# Patient Record
Sex: Male | Born: 1965 | Race: White | Hispanic: No | Smoking: Current every day smoker
Health system: Southern US, Community
[De-identification: ages and names within clinical notes are randomized; demographics above are authoritative.]

## PROBLEM LIST (undated history)

## (undated) DIAGNOSIS — J449 Chronic obstructive pulmonary disease, unspecified: Secondary | ICD-10-CM

## (undated) DIAGNOSIS — F1910 Other psychoactive substance abuse, uncomplicated: Secondary | ICD-10-CM

## (undated) DIAGNOSIS — E039 Hypothyroidism, unspecified: Secondary | ICD-10-CM

## (undated) DIAGNOSIS — C320 Malignant neoplasm of glottis: Secondary | ICD-10-CM

## (undated) DIAGNOSIS — I10 Essential (primary) hypertension: Secondary | ICD-10-CM

## (undated) DIAGNOSIS — R011 Cardiac murmur, unspecified: Secondary | ICD-10-CM

## (undated) DIAGNOSIS — R51 Headache: Secondary | ICD-10-CM

## (undated) DIAGNOSIS — R0602 Shortness of breath: Secondary | ICD-10-CM

## (undated) DIAGNOSIS — Z923 Personal history of irradiation: Secondary | ICD-10-CM

## (undated) HISTORY — DX: Malignant neoplasm of glottis: C32.0

## (undated) HISTORY — PX: TONSILLECTOMY: SUR1361

## (undated) HISTORY — PX: OTHER SURGICAL HISTORY: SHX169

## (undated) HISTORY — DX: Personal history of irradiation: Z92.3

---

## 2010-01-12 ENCOUNTER — Emergency Department (HOSPITAL_COMMUNITY): Admission: EM | Admit: 2010-01-12 | Discharge: 2010-01-12 | Payer: Self-pay | Admitting: Emergency Medicine

## 2010-01-16 ENCOUNTER — Emergency Department (HOSPITAL_COMMUNITY): Admission: EM | Admit: 2010-01-16 | Discharge: 2010-01-16 | Payer: Self-pay | Admitting: Emergency Medicine

## 2011-10-18 ENCOUNTER — Encounter (HOSPITAL_COMMUNITY): Payer: Self-pay

## 2011-10-18 ENCOUNTER — Emergency Department (HOSPITAL_COMMUNITY)
Admission: EM | Admit: 2011-10-18 | Discharge: 2011-10-18 | Disposition: A | Discharge status: Home / Self Care | Payer: Self-pay | Attending: Emergency Medicine | Admitting: Emergency Medicine

## 2011-10-18 DIAGNOSIS — F17200 Nicotine dependence, unspecified, uncomplicated: Secondary | ICD-10-CM | POA: Insufficient documentation

## 2011-10-18 DIAGNOSIS — K029 Dental caries, unspecified: Secondary | ICD-10-CM | POA: Insufficient documentation

## 2011-10-18 DIAGNOSIS — R490 Dysphonia: Secondary | ICD-10-CM | POA: Insufficient documentation

## 2011-10-18 DIAGNOSIS — R499 Unspecified voice and resonance disorder: Secondary | ICD-10-CM

## 2011-10-18 MED ORDER — AMOXICILLIN 500 MG PO CAPS: ORAL_CAPSULE | ORAL | Status: DC

## 2011-10-18 NOTE — Discharge Instructions (Signed)
Dental Caries IT IS IMPORTANT THAT YOU SEE THE EAR, NOSE AND THROAT SPECIALIST AS SOON AS POSSIBLE. PLEASE USE  AMOXICILLIN DAILY UNTIL ALL TAKEN. SEE YOUR DENTIST TO DEVELOP A PLAN CONCERNING YOUR MULTIPLE CAVITIES.   Dental caries (cavities) are areas of tooth decay. Cavities are usually caused by a combination of poor dental care; sugar; tobacco, alcohol, and drug abuse; decreased saliva production; and receding gums. If cavities are not treated by a dentist, they grow in size. This can cause toothaches, infection, and loss of the tooth. Cavities of the outer tooth enamel do not cause symptoms. Dental pain from cold drinks may be the first sign the enamel has broken down and decay has spread toward the root of the tooth. This can cause the tooth to die or become infected. If a cavity is treated before it causes toothache, the tooth can usually be saved. Cavities can be prevented by good oral hygiene. Brushing your teeth in the morning and before bed, and using dental floss once daily helps remove plaque and reduce bacteria. Candy, soft drinks, and other sources of sugar promote tooth decay by promoting the growth of bacteria in the mouth. Proper diet, fluoride, dental cleaning, and fillings are important in preventing the loss of teeth from decay. Antibiotics, root canal treatment, or dental extraction may be needed if the decay is severe. Take any pain medication or antibiotics as directed by your caregiver. It is important that you follow up with a dentist for definitive care. SEEK MEDICAL CARE IF:   You or your child has an oral temperature above 102 F (38.9 C).   There is difficulty opening the mouth.   There is difficulty swallowing or handling secretions.   There is difficulty breathing.   There is chest pain.   There are worsening or concerning symptoms.  Document Released: 08/17/2004 Document Revised: 06/29/2011 Document Reviewed: 11/02/2009 [facility] Patient Information 2012  [place], [place].

## 2011-10-18 NOTE — ED Notes (Signed)
Pt reports has sore in jaw x 6 months or a year.  Says will swell and burst in mouth.  Pt reports has been losing his voice for the past 2 weeks.  Denies any sore throat, cough or congestion.  Reports headache x 2 days.

## 2011-10-18 NOTE — ED Notes (Signed)
Pt presents with reoccurring infection in lower jaw/gum per pt. Pt also c/o headache. Denies other pain at this time.

## 2011-11-06 NOTE — ED Provider Notes (Signed)
History     CSN: [id number]  Arrival date & time 10/18/11  1449   First MD Initiated Contact with Patient 10/18/11 1514      Chief Complaint  Patient presents with  . Dental Pain  . Headache    (Consider location/radiation/quality/duration/timing/severity/associated sxs/prior treatment) Patient is a 46 y.o. male presenting with tooth pain and headaches. The history is provided by the patient and a friend.  Dental PainThe primary symptoms include mouth pain and headaches. Primary symptoms do not include shortness of breath, sore throat or cough. Primary symptoms comment: lose of voice The symptoms began more than 1 week ago. The symptoms are worsening. The symptoms occur frequently.  The headache is not associated with photophobia.  Additional symptoms include: dental sensitivity to temperature, gum swelling, gum tenderness, jaw pain and facial swelling. Additional symptoms do not include: trouble swallowing, pain with swallowing, excessive salivation, smell disturbance, ear pain and nosebleeds. Associated symptoms comments: hoarseness. Medical issues include: smoking.   Headache  Pertinent negatives include no palpitations and no shortness of breath.    History reviewed. No pertinent past medical history.  History reviewed. No pertinent past surgical history.  No family history on file.  History  Substance Use Topics  . Smoking status: Current Everyday Smoker  . Smokeless tobacco: Not on file  . Alcohol Use: No      Review of Systems  Constitutional: Negative for activity change.       All ROS Neg except as noted in HPI  HENT: Positive for congestion, facial swelling, dental problem and voice change. Negative for ear pain, nosebleeds, sore throat, trouble swallowing and neck pain.   Eyes: Negative for photophobia and discharge.  Respiratory: Negative for cough, shortness of breath and wheezing.   Cardiovascular: Negative for chest pain and palpitations.    Gastrointestinal: Negative for abdominal pain and blood in stool.  Genitourinary: Negative for dysuria, frequency and hematuria.  Musculoskeletal: Negative for back pain and arthralgias.  Skin: Negative.   Neurological: Positive for headaches. Negative for dizziness, seizures and speech difficulty.  Psychiatric/Behavioral: Negative for hallucinations and confusion.    Allergies  Review of patient's allergies indicates no known allergies.  Home Medications   Current Outpatient Rx  Name Route Sig Dispense Refill  . AMOXICILLIN 500 MG PO CAPS  2 po bid with food 28 capsule 0    BP 116/73  Pulse 80  Temp(Src) 97.4 F (36.3 C) (Oral)  Resp 20  Ht 5\' 11"  (1.803 m)  Wt 153 lb (69.4 kg)  BMI 21.34 kg/m2  SpO2 100%  Physical Exam  Nursing note and vitals reviewed. Constitutional: He is oriented to person, place, and time. He appears well-developed and well-nourished.  Non-toxic appearance.  HENT:  Head: Normocephalic.  Right Ear: Tympanic membrane and external ear normal.  Left Ear: Tympanic membrane and external ear normal.       Mulltiple dental caries. Mild swelling of right lower gums. Airway patent. Uvula mid line. No mass present  Eyes: EOM and lids are normal. Pupils are equal, round, and reactive to light.  Neck: Normal range of motion. Neck supple. Carotid bruit is not present.  Cardiovascular: Normal rate, regular rhythm, normal heart sounds, intact distal pulses and normal pulses.   Pulmonary/Chest: Breath sounds normal. No respiratory distress.  Abdominal: Soft. Bowel sounds are normal. There is no tenderness. There is no guarding.  Musculoskeletal: Normal range of motion.  Lymphadenopathy:       Head (right side): No submandibular adenopathy present.  Head (left side): No submandibular adenopathy present.    He has no cervical adenopathy.  Neurological: He is alert and oriented to person, place, and time. He has normal strength. No cranial nerve deficit or  sensory deficit.  Skin: Skin is warm and dry.  Psychiatric: He has a normal mood and affect. His speech is normal.    ED Course  Procedures (including critical care time)  Labs Reviewed - No data to display No results found.   1. Hoarseness or changing voice   2. Dental caries       MDM  *I have reviewed nursing notes, vital signs, and all appropriate lab and imaging results for this patient.** Pt strongly advised to see Dr Suszanne Conners (ENT) for evaluation. Rx amoxicillin given for assistance with dental infections. Pt to see a dentist as soon as possible.       Kathie Dike, [place] 11/06/11 2209

## 2011-11-08 NOTE — ED Provider Notes (Signed)
Medical screening examination/treatment/procedure(s) were performed by non-physician practitioner and as supervising physician I was immediately available for consultation/collaboration. Devoria Albe, MD, Armando Gang   Ward Givens, MD 11/08/11 [phone]

## 2012-02-22 NOTE — ED Notes (Signed)
Pt called to get referral information from prior discharge.

## 2012-03-21 ENCOUNTER — Ambulatory Visit (INDEPENDENT_AMBULATORY_CARE_PROVIDER_SITE_OTHER): Payer: Self-pay | Admitting: Otolaryngology

## 2013-03-17 ENCOUNTER — Ambulatory Visit: Payer: Self-pay | Attending: Internal Medicine | Admitting: Internal Medicine

## 2013-03-17 VITALS — BP 120/70 | HR 65 | Temp 98.0°F | Resp 20 | Ht 71.06 in | Wt 170.2 lb

## 2013-03-17 DIAGNOSIS — R519 Headache, unspecified: Secondary | ICD-10-CM | POA: Insufficient documentation

## 2013-03-17 DIAGNOSIS — R51 Headache: Secondary | ICD-10-CM | POA: Insufficient documentation

## 2013-03-17 DIAGNOSIS — R490 Dysphonia: Secondary | ICD-10-CM

## 2013-03-17 MED ORDER — TRAMADOL HCL 50 MG PO TABS: 50.0000 mg | ORAL_TABLET | Freq: Three times a day (TID) | ORAL | Status: DC | PRN

## 2013-03-17 NOTE — Progress Notes (Signed)
Pt is here to est care C/o voice hoarseness x1 year... Last week gradually getting worse accompanied w/HA Seen at a hosp ER... States they did not dx him w/anything and was to f/u w/a specialist that he never did Also reports he used to take thyroid meds about 8 yrs ago; only took it for a month and stopped Smokes 1 PPD Alert w/no signs of acute distress.

## 2013-03-17 NOTE — Progress Notes (Signed)
Patient ID: [name], male   DOB: 1966/05/31, 47 y.o.   MRN: [id number]  CC: To establish care  HPI: Patient is a 47 years old man who came to our clinic today to establish medical care. He has no significant past medical history, for the past 1 year patient has noticed increasing hoarseness and change in his voice. And recently he developed a nagging headache that comes and goes, today he has no headache, he describes the headache as global, sometimes with blurry vision, comes flashing and a releave without medication. He smokes cigarettes about a pack a day for the past 32 years. Denies chest pain, no difficulty swallowing, no shortness of breath. No leg swelling.  No Known Allergies History reviewed. No pertinent past medical history. Current Outpatient Prescriptions on File Prior to Visit  Medication Sig Dispense Refill  . amoxicillin (AMOXIL) 500 MG capsule 2 po bid with food  28 capsule  0   No current facility-administered medications on file prior to visit.   No family history on file. History   Social History  . Marital Status: Married    Spouse Name: N/A    Number of Children: N/A  . Years of Education: N/A   Occupational History  . Not on file.   Social History Main Topics  . Smoking status: Current Every Day Smoker -- 1.00 packs/day    Types: Cigarettes  . Smokeless tobacco: Not on file  . Alcohol Use: No  . Drug Use: No  . Sexual Activity: Not on file   Other Topics Concern  . Not on file   Social History Narrative  . No narrative on file    Review of Systems: Constitutional: Negative for fever, chills, diaphoresis, activity change, appetite change and fatigue. HENT: Negative for ear pain, nosebleeds, congestion, facial swelling, rhinorrhea, neck pain, neck stiffness and ear discharge.  Eyes: Negative for pain, discharge, redness, itching and visual disturbance. Respiratory: Negative for cough, choking, chest tightness, shortness of breath, wheezing and  stridor.  Cardiovascular: Negative for chest pain, palpitations and leg swelling. Gastrointestinal: Negative for abdominal distention. Genitourinary: Negative for dysuria, urgency, frequency, hematuria, flank pain, decreased urine volume, difficulty urinating and dyspareunia.  Musculoskeletal: Negative for back pain, joint swelling, arthralgias and gait problem. Neurological: Negative for dizziness, tremors, seizures, syncope, facial asymmetry, speech difficulty, weakness, light-headedness, numbness and headaches.  Hematological: Negative for adenopathy. Does not bruise/bleed easily. Psychiatric/Behavioral: Negative for hallucinations, behavioral problems, confusion, dysphoric mood, decreased concentration and agitation.    Objective:   Filed Vitals:   03/17/13 1240  BP: 120/70  Pulse: 65  Temp: 98 F (36.7 C)  Resp: 20    Physical Exam: Constitutional: Patient appears well-developed and well-nourished. No distress. HENT: Normocephalic, atraumatic, External right and left ear normal. Oropharynx is clear and moist.  Eyes: Conjunctivae and EOM are normal. PERRLA, no scleral icterus. Neck: Normal ROM. Neck supple. No JVD. No tracheal deviation. No thyromegaly. CVS: RRR, S1/S2 +, no murmurs, no gallops, no carotid bruit.  Pulmonary: Effort and breath sounds normal, no stridor, rhonchi, wheezes, rales.  Abdominal: Soft. BS +,  no distension, tenderness, rebound or guarding.  Musculoskeletal: Normal range of motion. No edema and no tenderness.  Lymphadenopathy: No lymphadenopathy noted, cervical, inguinal or axillary Neuro: Alert. Normal reflexes, muscle tone coordination. No cranial nerve deficit. Skin: Skin is warm and dry. No rash noted. Not diaphoretic. No erythema. No pallor. Psychiatric: Normal mood and affect. Behavior, judgment, thought content normal.  No results found for this  basename: WBC, HGB, HCT, MCV, PLT   No results found for this basename: CREATININE, BUN, NA, K, CL,  CO2    No results found for this basename: HGBA1C   Lipid Panel  No results found for this basename: chol, trig, hdl, cholhdl, vldl, ldlcalc       Assessment and plan:   Patient Active Problem List   Diagnosis Date Noted  . Hoarseness of voice 03/17/2013  . Headache(784.0) 03/17/2013   Patient's hoarse voice and recent development of headache is concerning for a major pathology in the head and neck region most importantly we need to rule out malignancy. CT scan head with contrast CT scan neck and soft tissue  Tramadol for headache  Labs: TSH and free T4 Comprehensive metabolic panel CBCD Urinalysis  Subsequent management with depend on findings from above investigations Ambulatory referral to ENT surgeon today for further evaluation  [name] was given clear instructions to go to ER or return to the clinic if symptoms don't improve, worsen or new problems develop.  [name] verbalized understanding.  [name] was told to call to get lab results if hasn't heard anything in the next week.        Jeanann Lewandowsky, MD [facility] [place], [place] [phone]   03/17/2013, 1:12 PM

## 2013-03-18 LAB — CBC WITH DIFFERENTIAL/PLATELET
Basophils Relative: 0 % (ref 0–1)
Hemoglobin: 14 g/dL (ref 13.0–17.0)
Lymphs Abs: 1.7 10*3/uL (ref 0.7–4.0)
Monocytes Relative: 6 % (ref 3–12)
Neutro Abs: 4.1 10*3/uL (ref 1.7–7.7)
Neutrophils Relative %: 63 % (ref 43–77)
RBC: 4.39 MIL/uL (ref 4.22–5.81)
WBC: 6.5 10*3/uL (ref 4.0–10.5)

## 2013-03-18 LAB — COMPLETE METABOLIC PANEL WITH GFR
AST: 18 U/L (ref 0–37)
Alkaline Phosphatase: 45 U/L (ref 39–117)
BUN: 16 mg/dL (ref 6–23)
Creat: 1.27 mg/dL (ref 0.50–1.35)
Potassium: 4 mEq/L (ref 3.5–5.3)

## 2013-03-19 LAB — TSH+FREE T4
Free T4: 0.24 ng/dL — ABNORMAL LOW (ref 0.82–1.77)
TSH: 114.2 u[IU]/mL — ABNORMAL HIGH (ref 0.450–4.500)

## 2013-03-20 ENCOUNTER — Ambulatory Visit (HOSPITAL_COMMUNITY): Payer: Self-pay

## 2013-03-20 ENCOUNTER — Ambulatory Visit (HOSPITAL_COMMUNITY)
Admission: RE | Admit: 2013-03-20 | Discharge: 2013-03-20 | Disposition: A | Discharge status: Home / Self Care | Payer: Self-pay | Source: Ambulatory Visit | Attending: Internal Medicine | Admitting: Internal Medicine

## 2013-03-20 ENCOUNTER — Other Ambulatory Visit: Payer: Self-pay | Admitting: Internal Medicine

## 2013-03-20 DIAGNOSIS — R490 Dysphonia: Secondary | ICD-10-CM

## 2013-03-20 DIAGNOSIS — J438 Other emphysema: Secondary | ICD-10-CM | POA: Insufficient documentation

## 2013-03-20 DIAGNOSIS — J387 Other diseases of larynx: Secondary | ICD-10-CM | POA: Insufficient documentation

## 2013-03-20 DIAGNOSIS — R51 Headache: Secondary | ICD-10-CM

## 2013-03-20 DIAGNOSIS — H538 Other visual disturbances: Secondary | ICD-10-CM | POA: Insufficient documentation

## 2013-03-20 MED ORDER — IOHEXOL 300 MG/ML  SOLN: 80.0000 mL | Freq: Once | INTRAMUSCULAR | Status: AC | PRN

## 2013-03-21 ENCOUNTER — Telehealth: Payer: Self-pay | Admitting: Emergency Medicine

## 2013-03-21 NOTE — Telephone Encounter (Signed)
Spoke with pt regarding CT results head/neck. Pt in process of getting orange card. Has referral in place for ENT. Will speak with Len Blalock RN

## 2013-03-21 NOTE — Telephone Encounter (Signed)
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 He needs to see an ENT surgeon immediately as scheduled ------

## 2013-03-27 ENCOUNTER — Ambulatory Visit: Payer: No Typology Code available for payment source | Attending: Family Medicine

## 2013-03-31 ENCOUNTER — Telehealth: Payer: Self-pay | Admitting: Emergency Medicine

## 2013-03-31 ENCOUNTER — Other Ambulatory Visit: Payer: Self-pay | Admitting: Internal Medicine

## 2013-03-31 DIAGNOSIS — E039 Hypothyroidism, unspecified: Secondary | ICD-10-CM

## 2013-03-31 MED ORDER — LEVOTHYROXINE SODIUM 50 MCG PO TABS: 50.0000 ug | ORAL_TABLET | Freq: Every day | ORAL | Status: DC

## 2013-03-31 NOTE — Telephone Encounter (Signed)
Script for Synthroid faxed in [facility]

## 2013-03-31 NOTE — Telephone Encounter (Signed)
PT REQUESTING LAB RESULTS

## 2013-03-31 NOTE — Telephone Encounter (Signed)
SPOKE WITH WIFE CONCERNING THYROID LEVEL RESULTS. SCRIPT CALLED IN SYNTHROID DAILY TO [facility]

## 2013-03-31 NOTE — Telephone Encounter (Signed)
Levothyroxine 50 mcg tab po daily, disp 30 tabs with 3 refills Patient needs ENT evaluation and treatment. Please refer and schedule appointment immediately. Thank you.

## 2013-03-31 NOTE — Telephone Encounter (Signed)
SPOKE WITH PT WIFE. APPROVED FOR [facility] DIRECTED WIFE TO Arna Medici FOR ENT SURGEON REFERRAL REQUESTING BLOOD WORK RESULTS

## 2013-04-17 ENCOUNTER — Ambulatory Visit: Payer: Self-pay | Admitting: Family Medicine

## 2013-04-18 ENCOUNTER — Encounter (HOSPITAL_COMMUNITY): Payer: Self-pay | Admitting: Pharmacy Technician

## 2013-04-21 ENCOUNTER — Other Ambulatory Visit: Payer: Self-pay | Admitting: Otolaryngology

## 2013-04-21 NOTE — Addendum Note (Signed)
Addended by: Annalee Genta, Deundra Furber on: 04/21/2013 12:00 PM   Modules accepted: Orders

## 2013-04-22 ENCOUNTER — Encounter (HOSPITAL_COMMUNITY)
Admission: RE | Admit: 2013-04-22 | Discharge: 2013-04-22 | Disposition: A | Discharge status: Home / Self Care | Payer: No Typology Code available for payment source | Source: Ambulatory Visit | Attending: Otolaryngology | Admitting: Otolaryngology

## 2013-04-22 ENCOUNTER — Encounter (HOSPITAL_COMMUNITY): Payer: Self-pay

## 2013-04-22 ENCOUNTER — Encounter (HOSPITAL_COMMUNITY)
Admission: RE | Admit: 2013-04-22 | Discharge: 2013-04-22 | Disposition: A | Discharge status: Home / Self Care | Payer: No Typology Code available for payment source | Source: Ambulatory Visit | Attending: Anesthesiology | Admitting: Anesthesiology

## 2013-04-22 DIAGNOSIS — Z01812 Encounter for preprocedural laboratory examination: Secondary | ICD-10-CM | POA: Insufficient documentation

## 2013-04-22 DIAGNOSIS — Z01818 Encounter for other preprocedural examination: Secondary | ICD-10-CM | POA: Insufficient documentation

## 2013-04-22 HISTORY — DX: Cardiac murmur, unspecified: R01.1

## 2013-04-22 HISTORY — DX: Hypothyroidism, unspecified: E03.9

## 2013-04-22 HISTORY — DX: Headache: R51

## 2013-04-22 LAB — CBC
HCT: 39.5 % (ref 39.0–52.0)
MCV: 95 fL (ref 78.0–100.0)
Platelets: 195 10*3/uL (ref 150–400)
RBC: 4.16 MIL/uL — ABNORMAL LOW (ref 4.22–5.81)
WBC: 6.3 10*3/uL (ref 4.0–10.5)

## 2013-04-22 NOTE — Pre-Procedure Instructions (Addendum)
[name]  04/22/2013   Your procedure is scheduled on: 04/24/13  [facility] at 810 AM.  Call this number if you have problems the morning of surgery: [phone]   Remember:   Do not eat food or drink liquids after midnight.   Take these medicines the morning of surgery with A SIP OF WATER: levothyroxine   Do not wear jewelry, make-up or nail polish.  Do not wear lotions, powders, or perfumes. You may wear deodorant.  Do not shave 48 hours prior to surgery. Men may shave face and neck.  Do not bring valuables to the hospital.  [facility] is not responsible                  for any belongings or valuables.               Contacts, dentures or bridgework may not be worn into surgery.  Leave suitcase in the car. After surgery it may be brought to your room.  For patients admitted to the hospital, discharge time is determined by your                treatment team.               Patients discharged the day of surgery will not be allowed to drive  home.  Name and phone number of your driver:   Special Instructions: Shower using CHG 2 nights before surgery and the night before surgery.  If you shower the day of surgery use CHG.  Use special wash - you have one bottle of CHG for all showers.  You should use approximately 1/3 of the bottle for each shower.   Please read over the following fact sheets that you were given: Pain Booklet, Coughing and Deep Breathing and Surgical Site Infection Prevention

## 2013-04-23 ENCOUNTER — Other Ambulatory Visit (HOSPITAL_COMMUNITY): Payer: No Typology Code available for payment source

## 2013-04-23 ENCOUNTER — Ambulatory Visit: Payer: Self-pay

## 2013-04-23 MED ORDER — CEFAZOLIN SODIUM-DEXTROSE 2-3 GM-% IV SOLR
2.0000 g | INTRAVENOUS | Status: AC
  Administered 2013-04-24: 2 g via INTRAVENOUS
  Filled 2013-04-23: qty 50

## 2013-04-24 ENCOUNTER — Encounter (HOSPITAL_COMMUNITY)
Admission: RE | Disposition: A | Discharge status: Home / Self Care | Payer: Self-pay | Source: Ambulatory Visit | Attending: Otolaryngology

## 2013-04-24 ENCOUNTER — Ambulatory Visit (HOSPITAL_COMMUNITY): Payer: No Typology Code available for payment source | Admitting: Anesthesiology

## 2013-04-24 ENCOUNTER — Encounter (HOSPITAL_COMMUNITY): Payer: Self-pay | Admitting: Otolaryngology

## 2013-04-24 ENCOUNTER — Ambulatory Visit (HOSPITAL_COMMUNITY)
Admission: RE | Admit: 2013-04-24 | Discharge: 2013-04-24 | Disposition: A | Discharge status: Home / Self Care | Payer: No Typology Code available for payment source | Source: Ambulatory Visit | Attending: Otolaryngology | Admitting: Otolaryngology

## 2013-04-24 ENCOUNTER — Encounter (HOSPITAL_COMMUNITY): Payer: Self-pay | Admitting: Vascular Surgery

## 2013-04-24 DIAGNOSIS — R220 Localized swelling, mass and lump, head: Secondary | ICD-10-CM | POA: Insufficient documentation

## 2013-04-24 DIAGNOSIS — J351 Hypertrophy of tonsils: Secondary | ICD-10-CM | POA: Insufficient documentation

## 2013-04-24 DIAGNOSIS — R490 Dysphonia: Secondary | ICD-10-CM | POA: Insufficient documentation

## 2013-04-24 DIAGNOSIS — C320 Malignant neoplasm of glottis: Secondary | ICD-10-CM | POA: Insufficient documentation

## 2013-04-24 DIAGNOSIS — J383 Other diseases of vocal cords: Secondary | ICD-10-CM

## 2013-04-24 HISTORY — PX: MICROLARYNGOSCOPY: SHX5208

## 2013-04-24 HISTORY — PX: DIRECT LARYNGOSCOPY: SHX5326

## 2013-04-24 SURGERY — LARYNGOSCOPY, DIRECT
Anesthesia: General | Site: Mouth | Laterality: Right | Wound class: Clean Contaminated

## 2013-04-24 MED ORDER — OXYCODONE HCL 5 MG/5ML PO SOLN: 5.0000 mg | Freq: Once | ORAL | Status: DC | PRN

## 2013-04-24 MED ORDER — ROCURONIUM BROMIDE 100 MG/10ML IV SOLN
INTRAVENOUS | Status: DC | PRN
  Administered 2013-04-24: 25 mg via INTRAVENOUS

## 2013-04-24 MED ORDER — EPINEPHRINE HCL (NASAL) 0.1 % NA SOLN
NASAL | Status: DC | PRN
  Administered 2013-04-24: 30 mL via TOPICAL

## 2013-04-24 MED ORDER — OXYCODONE HCL 5 MG PO TABS: 5.0000 mg | ORAL_TABLET | Freq: Once | ORAL | Status: DC | PRN

## 2013-04-24 MED ORDER — GLYCOPYRROLATE 0.2 MG/ML IJ SOLN
INTRAMUSCULAR | Status: DC | PRN
  Administered 2013-04-24: 0.6 mg via INTRAVENOUS

## 2013-04-24 MED ORDER — FENTANYL CITRATE 0.05 MG/ML IJ SOLN
INTRAMUSCULAR | Status: DC | PRN
  Administered 2013-04-24: 100 ug via INTRAVENOUS
  Administered 2013-04-24: 50 ug via INTRAVENOUS

## 2013-04-24 MED ORDER — SUCCINYLCHOLINE CHLORIDE 20 MG/ML IJ SOLN
INTRAMUSCULAR | Status: DC | PRN
  Administered 2013-04-24: 60 mg via INTRAVENOUS

## 2013-04-24 MED ORDER — PROPOFOL 10 MG/ML IV BOLUS
INTRAVENOUS | Status: DC | PRN
  Administered 2013-04-24: 180 mg via INTRAVENOUS

## 2013-04-24 MED ORDER — PROMETHAZINE HCL 25 MG/ML IJ SOLN: 6.2500 mg | INTRAMUSCULAR | Status: DC | PRN

## 2013-04-24 MED ORDER — LACTATED RINGERS IV SOLN
INTRAVENOUS | Status: DC
  Administered 2013-04-24: 09:00:00 via INTRAVENOUS

## 2013-04-24 MED ORDER — MIDAZOLAM HCL 5 MG/5ML IJ SOLN
INTRAMUSCULAR | Status: DC | PRN
  Administered 2013-04-24: 2 mg via INTRAVENOUS

## 2013-04-24 MED ORDER — FENTANYL CITRATE 0.05 MG/ML IJ SOLN: 50.0000 ug | Freq: Once | INTRAMUSCULAR | Status: DC

## 2013-04-24 MED ORDER — ONDANSETRON HCL 4 MG/2ML IJ SOLN
INTRAMUSCULAR | Status: DC | PRN
  Administered 2013-04-24: 4 mg via INTRAVENOUS

## 2013-04-24 MED ORDER — ALBUTEROL SULFATE HFA 108 (90 BASE) MCG/ACT IN AERS
INHALATION_SPRAY | RESPIRATORY_TRACT | Status: DC | PRN
  Administered 2013-04-24: 4 via RESPIRATORY_TRACT

## 2013-04-24 MED ORDER — EPINEPHRINE HCL (NASAL) 0.1 % NA SOLN
NASAL | Status: AC
  Filled 2013-04-24: qty 30

## 2013-04-24 MED ORDER — NEOSTIGMINE METHYLSULFATE 1 MG/ML IJ SOLN
INTRAMUSCULAR | Status: DC | PRN
  Administered 2013-04-24: 3 mg via INTRAVENOUS

## 2013-04-24 MED ORDER — HYDROMORPHONE HCL PF 1 MG/ML IJ SOLN: 0.2500 mg | INTRAMUSCULAR | Status: DC | PRN

## 2013-04-24 MED ORDER — DEXAMETHASONE SODIUM PHOSPHATE 10 MG/ML IJ SOLN
INTRAMUSCULAR | Status: AC
  Filled 2013-04-24: qty 1

## 2013-04-24 MED ORDER — MIDAZOLAM HCL 2 MG/2ML IJ SOLN: 1.0000 mg | INTRAMUSCULAR | Status: DC | PRN

## 2013-04-24 MED ORDER — 0.9 % SODIUM CHLORIDE (POUR BTL) OPTIME
TOPICAL | Status: DC | PRN
  Administered 2013-04-24: 1000 mL

## 2013-04-24 MED ORDER — DEXAMETHASONE SODIUM PHOSPHATE 10 MG/ML IJ SOLN
10.0000 mg | Freq: Once | INTRAMUSCULAR | Status: AC
  Administered 2013-04-24: 10 mg via INTRAVENOUS

## 2013-04-24 MED ORDER — LIDOCAINE HCL (CARDIAC) 20 MG/ML IV SOLN
INTRAVENOUS | Status: DC | PRN
  Administered 2013-04-24: 80 mg via INTRAVENOUS

## 2013-04-24 MED ORDER — HYDROCODONE-ACETAMINOPHEN 10-300 MG/15ML PO SOLN: 5.0000 mL | ORAL | Status: DC | PRN

## 2013-04-24 SURGICAL SUPPLY — 27 items
BALLN PULM 15 16.5 18X75 (BALLOONS) ×2
BALLOON PULM 15 16.5 18X75 (BALLOONS) ×1 IMPLANT
BLADE SURG 15 STRL LF DISP TIS (BLADE) ×1 IMPLANT
BLADE SURG 15 STRL SS (BLADE) ×1
CANISTER SUCTION 2500CC (MISCELLANEOUS) ×2 IMPLANT
CLOTH BEACON ORANGE TIMEOUT ST (SAFETY) ×2 IMPLANT
CONT SPEC 4OZ CLIKSEAL STRL BL (MISCELLANEOUS) ×2 IMPLANT
COVER TABLE BACK 60X90 (DRAPES) ×2 IMPLANT
DRAPE PROXIMA HALF (DRAPES) ×2 IMPLANT
DRESSING TELFA 8X3 (GAUZE/BANDAGES/DRESSINGS) ×2 IMPLANT
GAUZE SPONGE 4X4 16PLY XRAY LF (GAUZE/BANDAGES/DRESSINGS) ×2 IMPLANT
GLOVE BIOGEL M 7.0 STRL (GLOVE) ×2 IMPLANT
GLOVE BIOGEL PI IND STRL 6.5 (GLOVE) ×1 IMPLANT
GLOVE BIOGEL PI INDICATOR 6.5 (GLOVE) ×1
GUARD TEETH (MISCELLANEOUS) ×2 IMPLANT
KIT ROOM TURNOVER OR (KITS) ×2 IMPLANT
NEEDLE 18GX1X1/2 (RX/OR ONLY) (NEEDLE) ×2 IMPLANT
NEEDLE 22X1 1/2 (OR ONLY) (NEEDLE) ×2 IMPLANT
NS IRRIG 1000ML POUR BTL (IV SOLUTION) ×2 IMPLANT
PAD ARMBOARD 7.5X6 YLW CONV (MISCELLANEOUS) ×4 IMPLANT
PATTIES SURGICAL .5 X3 (DISPOSABLE) ×2 IMPLANT
SOLUTION ANTI FOG 6CC (MISCELLANEOUS) ×2 IMPLANT
SPONGE GAUZE 4X4 12PLY (GAUZE/BANDAGES/DRESSINGS) ×2 IMPLANT
SURGILUBE 2OZ TUBE FLIPTOP (MISCELLANEOUS) ×2 IMPLANT
SYR INFLATE BILIARY GAUGE (MISCELLANEOUS) ×2 IMPLANT
TOWEL OR 17X24 6PK STRL BLUE (TOWEL DISPOSABLE) ×4 IMPLANT
TUBE CONNECTING 12X1/4 (SUCTIONS) ×2 IMPLANT

## 2013-04-24 NOTE — Anesthesia Postprocedure Evaluation (Signed)
  Anesthesia Post-op Note  Patient: [name]  Procedure(s) Performed: Procedure(s): DIRECT LARYNGOSCOPY and BIOPSY OF TONGUE (Right) MICROLARYNGOSCOPY and RIGHT VOCAL CORD BIOPSY (Right)  Patient Location: PACU  Anesthesia Type:General  Level of Consciousness: awake and alert   Airway and Oxygen Therapy: Patient Spontanous Breathing  Post-op Pain: mild  Post-op Assessment: Post-op Vital signs reviewed, Patient's Cardiovascular Status Stable, Respiratory Function Stable, Patent Airway, No signs of Nausea or vomiting and Pain level controlled  Post-op Vital Signs: Reviewed and stable  Complications: No apparent anesthesia complications

## 2013-04-24 NOTE — H&P (Signed)
[name] is an 47 y.o. male.   Chief Complaint: Chronic hoarseness HPI: Long term smoker, 2 yr hx of prog hoarseness. 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  Past Medical History  Diagnosis Date  . Hypothyroidism   . Heart murmur     child  . ZOXWRUEA(540.9)     Past Surgical History  Procedure Laterality Date  . Tonsillectomy    . Wisdon teeth      No family history on file. Social History:  reports that he has been smoking Cigarettes.  He has a 68 pack-year smoking history. He does not have any smokeless tobacco history on file. He reports that he does not drink alcohol or use illicit drugs.  Allergies: No Known Allergies  No prescriptions prior to admission    Results for orders placed during the hospital encounter of 04/22/13 (from the past 48 hour(s))  CBC     Status: Abnormal   Collection Time    04/22/13  3:45 PM      Result Value Range   WBC 6.3  4.0 - 10.5 K/uL   RBC 4.16 (*) 4.22 - 5.81 MIL/uL   Hemoglobin 13.9  13.0 - 17.0 g/dL   HCT 81.1  91.4 - 78.2 %   MCV 95.0  78.0 - 100.0 fL   MCH 33.4  26.0 - 34.0 pg   MCHC 35.2  30.0 - 36.0 g/dL   RDW 95.6  21.3 - 08.6 %   Platelets 195  150 - 400 K/uL   Dg Chest 2 View  04/22/2013   CLINICAL DATA:  Pre admit for vocal cord surgery. Smoker. Hoarseness.  EXAM: CHEST  2 VIEW  COMPARISON:  None.  FINDINGS: Mild hyperinflation. Midline trachea. Normal heart size and mediastinal contours. No pleural effusion or pneumothorax. Mild biapical pleural parenchymal scarring. Clear lungs.  IMPRESSION: Mild hyperinflation and pleural-parenchymal scarring at the apices. No acute findings.   Electronically Signed   By: Jeronimo Greaves   On: 04/22/2013 16:05    Review of Systems  Constitutional: Negative.   Respiratory: Negative.   Cardiovascular: Negative.   Gastrointestinal: Negative.   Genitourinary: Negative.     There were no vitals taken for this visit. Physical Exam  Constitutional: He is oriented to  person, place, and time. He appears well-developed and well-nourished.  HENT:  Lingual tonsil hypertrophy Rt VC mass  Neck: Normal range of motion. Neck supple.  Cardiovascular: Normal rate.   Respiratory: Effort normal.  GI: Soft. Bowel sounds are normal.  Musculoskeletal: Normal range of motion.  Neurological: He is alert and oriented to person, place, and time.     Assessment/Plan Adm for OP DL and Bx.  Audel Coakley 04/24/2013, 7:42 AM

## 2013-04-24 NOTE — Anesthesia Preprocedure Evaluation (Addendum)
Anesthesia Evaluation  Patient identified by MRN, date of birth, ID band Patient awake    Reviewed: Allergy & Precautions, H&P , NPO status , Patient's Chart, lab work & pertinent test results  Airway Mallampati: I TM Distance: >3 FB Neck ROM: Full    Dental  (+) Teeth Intact   Pulmonary COPDCurrent Smoker,  hoarse + rhonchi         Cardiovascular Rhythm:Regular Rate:Normal     Neuro/Psych  Headaches,    GI/Hepatic   Endo/Other  Hypothyroidism   Renal/GU      Musculoskeletal   Abdominal   Peds  Hematology   Anesthesia Other Findings   Reproductive/Obstetrics                         Anesthesia Physical Anesthesia Plan  ASA: II  Anesthesia Plan: General   Post-op Pain Management:    Induction: Intravenous  Airway Management Planned: Oral ETT  Additional Equipment:   Intra-op Plan:   Post-operative Plan: Extubation in OR  Informed Consent: I have reviewed the patients History and Physical, chart, labs and discussed the procedure including the risks, benefits and alternatives for the proposed anesthesia with the patient or authorized representative who has indicated his/her understanding and acceptance.     Plan Discussed with: CRNA and Surgeon  Anesthesia Plan Comments:         Anesthesia Quick Evaluation

## 2013-04-24 NOTE — Preoperative (Signed)
Beta Blockers   Reason not to administer Beta Blockers:Not Applicable 

## 2013-04-24 NOTE — Anesthesia Procedure Notes (Addendum)
Procedure Name: Intubation Date/Time: 04/24/2013 10:17 AM Performed by: Quentin Ore Pre-anesthesia Checklist: Patient identified, Emergency Drugs available, Suction available, Patient being monitored and Timeout performed Patient Re-evaluated:Patient Re-evaluated prior to inductionOxygen Delivery Method: Circle system utilized Preoxygenation: Pre-oxygenation with 100% oxygen Intubation Type: IV induction Ventilation: Mask ventilation without difficulty Laryngoscope Size: Mac and 4 Grade View: Grade II Tube type: Oral Tube size: 6.5 mm Number of attempts: 1 Airway Equipment and Method: Stylet Placement Confirmation: ETT inserted through vocal cords under direct vision,  positive ETCO2 and breath sounds checked- equal and bilateral Secured at: 22 cm Tube secured with: Tape Dental Injury: Teeth and Oropharynx as per pre-operative assessment  Comments: Bottom lip injury with DL per surgeon.

## 2013-04-24 NOTE — Brief Op Note (Signed)
04/24/2013  11:13 AM  PATIENT:  [name]  47 y.o. male  PRE-OPERATIVE DIAGNOSIS:  Swelling, Mass, or Lump in Head and Neck/Congenital Anomaly of Larynx, Trachea, and Bronchus/Dysphonia   POST-OPERATIVE DIAGNOSIS:  Swelling, Mass, or Lump in Head and Neck/Congenital Anomaly of Larynx, Trachea, and Bronchus/Dysphonia   PROCEDURE:  Procedure(s): DIRECT LARYNGOSCOPY and BIOPSY OF TONGUE (Right) MICROLARYNGOSCOPY and RIGHT VOCAL CORD BIOPSY (Right)  SURGEON:  Surgeon(s) and Role:    * Osborn Coho, MD - Primary  PHYSICIAN ASSISTANT:   ASSISTANTS: none   ANESTHESIA:   general  EBL:   Min  BLOOD ADMINISTERED:none  DRAINS: none   LOCAL MEDICATIONS USED:  NONE  SPECIMEN:  Source of Specimen:  1. Rt vocal mass  2. tongue base   DISPOSITION OF SPECIMEN:  PATHOLOGY  COUNTS:  YES  TOURNIQUET:  * No tourniquets in log *  DICTATION: .Other Dictation: Dictation Number [id number]  PLAN OF CARE: Discharge to home after PACU  PATIENT DISPOSITION:  PACU - hemodynamically stable.   Delay start of Pharmacological VTE agent (>24hrs) due to surgical blood loss or risk of bleeding: not applicable

## 2013-04-24 NOTE — Transfer of Care (Signed)
Immediate Anesthesia Transfer of Care Note  Patient: [name]  Procedure(s) Performed: Procedure(s): DIRECT LARYNGOSCOPY and BIOPSY OF TONGUE (Right) MICROLARYNGOSCOPY and RIGHT VOCAL CORD BIOPSY (Right)  Patient Location: PACU  Anesthesia Type:General  Level of Consciousness: awake, alert  and oriented  Airway & Oxygen Therapy: Patient Spontanous Breathing and Patient connected to face mask oxygen  Post-op Assessment: Report given to PACU RN, Post -op Vital signs reviewed and stable and Patient moving all extremities X 4  Post vital signs: Reviewed and stable  Complications: No apparent anesthesia complications

## 2013-04-25 ENCOUNTER — Encounter (HOSPITAL_COMMUNITY): Payer: Self-pay | Admitting: Otolaryngology

## 2013-04-25 NOTE — Op Note (Signed)
[name], [name] NO.:  [id number]  MEDICAL RECORD NO.:  [id number]  LOCATION:  [facility]                         FACILITY:  [facility]  PHYSICIAN:  Kinnie Scales. Annalee Genta, M.D.DATE OF BIRTH:  Dec 20, 1965  DATE OF PROCEDURE:  04/24/2013 DATE OF DISCHARGE:  04/24/2013                              OPERATIVE REPORT   PREOPERATIVE DIAGNOSES: 1. Right vocal cord mass. 2. Chronic hoarseness. 3. Lingual tonsillar hypertrophy.  POSTOPERATIVE DIAGNOSIS: 1. Right vocal cord mass. 2. Chronic hoarseness. 3. Lingual tonsillar hypertrophy.  INDICATION FOR SURGERY: 1. Right vocal cord mass. 2. Chronic hoarseness. 3. Lingual tonsillar hypertrophy.  SURGICAL PROCEDURE: 1. Direct laryngoscopy with biopsy of tongue base. 2. Microlaryngoscopy with biopsy of right vocal cord mass.  ANESTHESIA:  General endotracheal.  SURGEON:  Kinnie Scales. Annalee Genta, MD.  COMPLICATIONS:  None.  ESTIMATED BLOOD LOSS:  Minimal.  The patient transferred from the operating room to recovery room in stable condition.  BRIEF HISTORY:  The patient is a 47 year old, white male, who is referred for evaluation of hoarseness and a possible mass involving the base of tongue/valleculum.  The patient is a chronic 2 pack per day smoker.  He has a 2-year history of chronic hoarseness which had not been evaluated.  He presented to the hospital for evaluation of headache and a CT scan, which included the head and neck showed a soft tissue swelling at the base of tongue/valleculum.  He was referred to our office for further evaluation.  The patient had undergone previous tonsillectomy and had no dysphagia, odynophagia, or complaints.  He did note incidentally that 2-year history of chronic hoarseness, raspy voice, and poor vocal quality.  Given the patient's history, CT findings, and smoking history, flexible laryngoscopy was performed in the office.  The patient's base of tongue and supraglottis appeared normal.   There was lingual tonsillar hypertrophy with no ulcer mass or lesion.  Visualizing the true vocal cords, the patient had a large exophytic mass involving the mid aspect of the right true vocal cord. Given history and findings, I recommended the above surgical procedures for diagnostic tissue biopsy and further workup and treatment.  The risks and benefits of the procedure were discussed in detail, and the patient and his wife understood and concurred with our plan for surgery which is scheduled on elective basis at [facility] under general anesthesia as an outpatient.  DESCRIPTION OF PROCEDURE:  The patient was brought to the operating room on April 24, 2013, and placed in supine position on the operating table.  General endotracheal anesthesia was established without difficulty.  When the patient was adequately anesthetized, he was then positioned and prepped and draped in a sterile fashion.  The examination and biopsy of the tongue base was then undertaken using a Dedo laryngoscope.  The patient's oral cavity and oropharynx were examined. He had very poor oral dentition, but no evidence of ulcer mass or lesion.  The patient undergone previous tonsillectomy, tongue base, and supraglottis were evaluated.  The patient had lobular soft tissue at the level of the valleculum which was consistent with lingual tonsillar hypertrophy.  Several representative biopsies were taken with cup forceps and this was sent to  Pathology for gross microscopic evaluation.  With the tongue base assessed, microlaryngoscopy was then undertaken. The Dedo laryngoscope was inserted in the piriform sinuses and inspected bilaterally.  No evidence of ulcer, mass, or lesion.  The esophageal introitus appeared normal.  The scope was inserted full length and the larynx was fully exposed.  The microscope was moved into position. Microlaryngoscopy was performed.  The patient had a large exophytic mass involving  the mid aspect of the right true vocal cord.  Left true vocal cord appeared normal.  Anterior aspect of the right cord and anterior commissure appeared normal.  The mass was biopsied with multiple cup forceps biopsy bite and this was sent to pathology as a separate specimen.  Small adrenaline patty was placed over the biopsy sites and there was minimal bleeding.  The laryngoscope was then carefully removed.  No loose or broken teeth.  No significant oral trauma. Laryngoscope was removed in its entirety.  The sponge count was correct. There was no bleeding.  The patient was awakened from his anesthetic, extubated, and transferred from the operating room to recovery room in stable condition.  There were no complications.          ______________________________ Kinnie Scales Annalee Genta, M.D.     DLS/MEDQ  D:  40/98/1191  T:  04/25/2013  Job:  [id number]

## 2013-04-28 ENCOUNTER — Telehealth: Payer: Self-pay | Admitting: Internal Medicine

## 2013-04-28 ENCOUNTER — Other Ambulatory Visit: Payer: Self-pay | Admitting: Internal Medicine

## 2013-04-28 DIAGNOSIS — C320 Malignant neoplasm of glottis: Secondary | ICD-10-CM

## 2013-04-28 NOTE — Telephone Encounter (Signed)
Left message for pt tot call clinic for biopsy results

## 2013-04-28 NOTE — Telephone Encounter (Signed)
Message copied by Darlis Loan on Mon Apr 28, 2013 11:37 AM ------      Message from: Quentin Angst      Created: Mon Apr 28, 2013 10:52 AM       Please call to inform patient that the vocal cord biopsy report showed malignancy and he is being referred to an oncologist for further evaluation and treatment. ------

## 2013-04-29 ENCOUNTER — Telehealth: Payer: Self-pay | Admitting: Hematology and Oncology

## 2013-04-29 NOTE — Telephone Encounter (Signed)
S/W PT WIFE AND GVE NP APPT 10/09 @ 10 W/DR. GORSUCH. REFERRING DR. Sofie Hartigan CELL CARCINOMA OF VOCAL CORD  .

## 2013-04-29 NOTE — Telephone Encounter (Signed)
C/D 04/29/13 for appt. 05/01/13

## 2013-05-01 ENCOUNTER — Encounter: Payer: Self-pay | Admitting: Hematology and Oncology

## 2013-05-01 ENCOUNTER — Telehealth: Payer: Self-pay | Admitting: Hematology and Oncology

## 2013-05-01 ENCOUNTER — Encounter: Payer: Self-pay | Admitting: *Deleted

## 2013-05-01 ENCOUNTER — Ambulatory Visit: Payer: No Typology Code available for payment source

## 2013-05-01 ENCOUNTER — Ambulatory Visit (HOSPITAL_BASED_OUTPATIENT_CLINIC_OR_DEPARTMENT_OTHER): Payer: No Typology Code available for payment source | Admitting: Hematology and Oncology

## 2013-05-01 VITALS — BP 122/79 | HR 79 | Temp 96.9°F | Ht 71.5 in | Wt 165.4 lb

## 2013-05-01 DIAGNOSIS — F17200 Nicotine dependence, unspecified, uncomplicated: Secondary | ICD-10-CM

## 2013-05-01 DIAGNOSIS — C320 Malignant neoplasm of glottis: Secondary | ICD-10-CM

## 2013-05-01 DIAGNOSIS — R490 Dysphonia: Secondary | ICD-10-CM

## 2013-05-01 DIAGNOSIS — C329 Malignant neoplasm of larynx, unspecified: Secondary | ICD-10-CM

## 2013-05-01 HISTORY — DX: Malignant neoplasm of glottis: C32.0

## 2013-05-01 NOTE — Progress Notes (Signed)
Met with pt during scheduled appt with Dr. Bertis Ruddy.  Explained my role as his navigator and encouraged him as well as his wife to contact me at any time during the course of his appointments and procedures.  They indicated understanding.  Will begin navigating as L1 (new) patient.  Young Berry, RN, BSN, [facility] Head & Neck Oncology Navigator [phone]

## 2013-05-01 NOTE — Progress Notes (Signed)
[facility] CONSULT NOTE CHIEF COMPLAINTS/PURPOSE OF CONSULTATION: Vocal cord squamous cell carcinoma  HISTORY OF PRESENTING ILLNESS:  [name] 47 y.o. male is accompanied by his daughter and his wife. According to the patient, his been complaining of hoarseness of his voice for about 1-1/2-2 years. He also has a sensation of sore throat and mild dysphasia especially after recent surgery and biopsy. He thought he might have fell intermittent swelling of lymph glands around his neck. He has lost about 5 pounds of weight recently. Had mild swallowing difficulties but no choking sensation. No cough. Denies any hearing deficit.  MEDICAL HISTORY:  Past Medical History  Diagnosis Date  . Hypothyroidism   . Heart murmur     child  . Headache(784.0)     SURGICAL HISTORY: Past Surgical History  Procedure Laterality Date  . Tonsillectomy    . Wisdon teeth    . Direct laryngoscopy Right 04/24/2013    Procedure: DIRECT LARYNGOSCOPY and BIOPSY OF TONGUE;  Surgeon: Osborn Coho, MD;  Location: [facility] OR;  Service: ENT;  Laterality: Right;  . Microlaryngoscopy Right 04/24/2013    Procedure: MICROLARYNGOSCOPY and RIGHT VOCAL CORD BIOPSY;  Surgeon: Osborn Coho, MD;  Location: [facility] OR;  Service: ENT;  Laterality: Right;    SOCIAL HISTORY: History   Social History  . Marital Status: Married    Spouse Name: N/A    Number of Children: N/A  . Years of Education: N/A   Occupational History  . Not on file.   Social History Main Topics  . Smoking status: Current Every Day Smoker -- 2.00 packs/day for 34 years    Types: Cigarettes  . Smokeless tobacco: Not on file  . Alcohol Use: No  . Drug Use: No  . Sexual Activity: Not on file   Other Topics Concern  . Not on file   Social History Narrative  . No narrative on file    FAMILY HISTORY: History reviewed. No pertinent family history.  ALLERGIES:  has No Known Allergies.  MEDICATIONS: Current outpatient  prescriptions:Hydrocodone-Acetaminophen (LORTAB) 10-300 MG/15ML SOLN, Take 5-10 mLs by mouth every 4 (four) hours as needed (sore throat)., Disp: 120 mL, Rfl: 0;  levothyroxine (SYNTHROID, LEVOTHROID) 50 MCG tablet, Take 50 mcg by mouth daily., Disp: , Rfl:   REVIEW OF SYSTEMS:   Constitutional: Denies fevers, chills  Eyes: Denies blurriness of vision, double vision or watery eyes Ears, nose, mouth, throat, and face: Denies mucositis  Respiratory: Denies cough, dyspnea or wheezes Cardiovascular: Denies palpitation, chest discomfort or lower extremity swelling Gastrointestinal:  Denies nausea, heartburn or change in bowel habits Skin: Denies abnormal skin rashes Lymphatics: Denies new lymphadenopathy or easy bruising Neurological:Denies numbness, tingling or new weaknesses Behavioral/Psych: Mood is stable, no new changes  All other systems were reviewed with the patient and are negative.  PHYSICAL EXAMINATION: ECOG PERFORMANCE STATUS: 1 - Symptomatic but completely ambulatory  Filed Vitals:   05/01/13 1054  BP: 122/79  Pulse: 79  Temp: 96.9 F (36.1 C)   Filed Weights   05/01/13 1054  Weight: 165 lb 6.4 oz (75.025 kg)    GENERAL:alert, no distress and comfortable he looks thin but not cachectic SKIN: skin color, texture, turgor are normal, no rashes or significant lesions EYES: normal, conjunctiva are pink and non-injected, sclera clear OROPHARYNX:no exudate, no erythema and lips, buccal mucosa, and tongue normal no oral thrush very poor dentition is noted NECK: supple, thyroid normal size, non-tender, without nodularity LYMPH:  no palpable lymphadenopathy in the  cervical, axillary or inguinal LUNGS: clear to auscultation and percussion with normal breathing effort HEART: regular rate & rhythm and no murmurs and no lower extremity edema ABDOMEN:abdomen soft, non-tender and normal bowel sounds Musculoskeletal:no cyanosis of digits and no clubbing  PSYCH: alert & oriented x 3  with fluent speech except he is hoarse NEURO: no focal motor/sensory deficits  LABORATORY DATA:  I have reviewed the data as listed Recent Results (from the past 2160 hour(s))  COMPLETE METABOLIC PANEL WITH GFR     Status: None   Collection Time    03/17/13  1:24 PM      Result Value Range   Sodium 138  135 - 145 mEq/L   Potassium 4.0  3.5 - 5.3 mEq/L   Chloride 103  96 - 112 mEq/L   CO2 27  19 - 32 mEq/L   Glucose, Bld 89  70 - 99 mg/dL   BUN 16  6 - 23 mg/dL   Creat 0.45  4.09 - 8.11 mg/dL   Total Bilirubin 0.6  0.3 - 1.2 mg/dL   Alkaline Phosphatase 45  39 - 117 U/L   AST 18  0 - 37 U/L   ALT 16  0 - 53 U/L   Total Protein 7.3  6.0 - 8.3 g/dL   Albumin 4.5  3.5 - 5.2 g/dL   Calcium 9.3  8.4 - 91.4 mg/dL   GFR, Est African American 77     GFR, Est Non African American 66     Comment:       The estimated GFR is a calculation valid for adults (>=46 years old)     that uses the CKD-EPI algorithm to adjust for age and sex. It is       not to be used for children, pregnant women, hospitalized patients,        patients on dialysis, or with rapidly changing kidney function.     According to the NKDEP, eGFR >89 is normal, 60-89 shows mild     impairment, 30-59 shows moderate impairment, 15-29 shows severe     impairment and <15 is ESRD.        TSH+FREE T4     Status: Abnormal   Collection Time    03/17/13  1:24 PM      Result Value Range   TSH 114.200 (*) 0.450 - 4.500 uIU/mL   Free T4 0.24 (*) 0.82 - 1.77 ng/dL  CBC WITH DIFFERENTIAL     Status: None   Collection Time    03/17/13  1:24 PM      Result Value Range   WBC 6.5  4.0 - 10.5 K/uL   RBC 4.39  4.22 - 5.81 MIL/uL   Hemoglobin 14.0  13.0 - 17.0 g/dL   HCT 78.2  95.6 - 21.3 %   MCV 96.1  78.0 - 100.0 fL   MCH 31.9  26.0 - 34.0 pg   MCHC 33.2  30.0 - 36.0 g/dL   RDW 08.6  57.8 - 46.9 %   Platelets 211  150 - 400 K/uL   Neutrophils Relative % 63  43 - 77 %   Neutro Abs 4.1  1.7 - 7.7 K/uL   Lymphocytes Relative 27   12 - 46 %   Lymphs Abs 1.7  0.7 - 4.0 K/uL   Monocytes Relative 6  3 - 12 %   Monocytes Absolute 0.4  0.1 - 1.0 K/uL   Eosinophils Relative 4  0 - 5 %  Eosinophils Absolute 0.2  0.0 - 0.7 K/uL   Basophils Relative 0  0 - 1 %   Basophils Absolute 0.0  0.0 - 0.1 K/uL   Smear Review Criteria for review not met    CBC     Status: Abnormal   Collection Time    04/22/13  3:45 PM      Result Value Range   WBC 6.3  4.0 - 10.5 K/uL   RBC 4.16 (*) 4.22 - 5.81 MIL/uL   Hemoglobin 13.9  13.0 - 17.0 g/dL   HCT 59.5  63.8 - 75.6 %   MCV 95.0  78.0 - 100.0 fL   MCH 33.4  26.0 - 34.0 pg   MCHC 35.2  30.0 - 36.0 g/dL   RDW 43.3  29.5 - 18.8 %   Platelets 195  150 - 400 K/uL    RADIOGRAPHIC STUDIES: I have personally reviewed the radiological images as listed and agreed with the findings in the report. Dg Chest 2 View  04/22/2013   CLINICAL DATA:  Pre admit for vocal cord surgery. Smoker. Hoarseness.  EXAM: CHEST  2 VIEW  COMPARISON:  None.  FINDINGS: Mild hyperinflation. Midline trachea. Normal heart size and mediastinal contours. No pleural effusion or pneumothorax. Mild biapical pleural parenchymal scarring. Clear lungs.  IMPRESSION: Mild hyperinflation and pleural-parenchymal scarring at the apices. No acute findings.   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 CT scan that was done recently showed no evidence of lymph node involvement. I recommend a PET/CT scan for further evaluation and referral to radiation oncologist for consideration for radiation treatments. We will present his case in the next ENT tumor board. Due to poor dentition, I recommend referral to a dentist. Due to recent weight loss, I recommend nutrition evaluation. Due to hoarseness, recommend speech and language pathologist's assessment.  We discussed about the importance of multidisciplinary team approach in the management of his cancer. #2 heavy smoking The patient is interested to quit smoking. I recommend the patient to call the quit line.  All questions were answered. The patient knows to call the clinic with any problems, questions or concerns. I can certainly see the patient much sooner if necessary. No barriers to learning was detected.  Chi Garlow, MD 05/01/2013 11:49 AM

## 2013-05-01 NOTE — Telephone Encounter (Signed)
gv and printed appt sched and avs for pt for OCT...pt aware cs will cotact with ct scan

## 2013-05-05 NOTE — Progress Notes (Signed)
Head and Neck Cancer Location of Tumor / Histology: Invasive Squamous Cell of the Right Vocal Cord  Patient presented on 05/01/13 with symptoms of: complaining of hoarseness of his voice for about 1-1/2-2 years. He also has a sensation of sore throat and mild dysphasia.  He thought he might have fell intermittent swelling of lymph glands around his neck    Biopsies of Right Vocal Cord(if applicable) revealed: 04/24/13 Diagnosis 1. Vocal cord, biopsy, right - INVASIVE SQUAMOUS CELL CARCINOMA, MODERATELY DIFFERENTIATED. 2. Tongue, biopsy, base, midline - REACTIVE SQUAMOUS MUCOSA WITH CHRONIC   Nutrition Status:  Weight changes: 3-  5 lbs   Swallowing status: "some difficulty"  Plans, if any, for PEG tube:   Tobacco/Marijuana/Snuff/ETOH use: 2 ppd x 34 years, No alcohol, No Drug use  Past/Anticipated interventions by otolaryngology, if any: Biopsy 04/24/13  Past/Anticipated interventions by medical oncology, if any: Presentation at Tumor board and scheduled PET scan on 05/19/13  Referrals yet, to any of the following?  Social Work?   Dentistry? To refer due to poor dentition  Swallowing therapy?   Nutrition?   Med/Onc?   PEG placement?   Speech?  To refer due to Hoarseness  SAFETY ISSUES:  Prior radiation? No  Pacemaker/ICD? No  Possible current pregnancy? No  Is the patient on methotrexate? No  Current Complaints / other details:  C/o frontal headache for the past 24 hours and states that he can feel his pulse on the top of his head.

## 2013-05-06 ENCOUNTER — Ambulatory Visit
Admission: RE | Admit: 2013-05-06 | Discharge: 2013-05-06 | Disposition: A | Discharge status: Home / Self Care | Payer: No Typology Code available for payment source | Source: Ambulatory Visit | Attending: Radiation Oncology | Admitting: Radiation Oncology

## 2013-05-06 ENCOUNTER — Telehealth: Payer: Self-pay | Admitting: *Deleted

## 2013-05-06 ENCOUNTER — Ambulatory Visit: Payer: No Typology Code available for payment source

## 2013-05-06 ENCOUNTER — Encounter: Payer: Self-pay | Admitting: *Deleted

## 2013-05-06 ENCOUNTER — Other Ambulatory Visit: Payer: Self-pay | Admitting: *Deleted

## 2013-05-06 ENCOUNTER — Other Ambulatory Visit: Payer: Self-pay | Admitting: Radiation Oncology

## 2013-05-06 ENCOUNTER — Encounter: Payer: Self-pay | Admitting: Radiation Oncology

## 2013-05-06 VITALS — BP 116/71 | HR 84 | Temp 98.2°F | Ht 71.5 in | Wt 167.8 lb

## 2013-05-06 DIAGNOSIS — C320 Malignant neoplasm of glottis: Secondary | ICD-10-CM

## 2013-05-06 DIAGNOSIS — F17200 Nicotine dependence, unspecified, uncomplicated: Secondary | ICD-10-CM | POA: Insufficient documentation

## 2013-05-06 DIAGNOSIS — B370 Candidal stomatitis: Secondary | ICD-10-CM | POA: Insufficient documentation

## 2013-05-06 DIAGNOSIS — E039 Hypothyroidism, unspecified: Secondary | ICD-10-CM | POA: Insufficient documentation

## 2013-05-06 DIAGNOSIS — Z79899 Other long term (current) drug therapy: Secondary | ICD-10-CM | POA: Insufficient documentation

## 2013-05-06 DIAGNOSIS — R1310 Dysphagia, unspecified: Secondary | ICD-10-CM | POA: Insufficient documentation

## 2013-05-06 DIAGNOSIS — Z8589 Personal history of malignant neoplasm of other organs and systems: Secondary | ICD-10-CM | POA: Insufficient documentation

## 2013-05-06 MED ORDER — LARYNGOSCOPY SOLUTION RAD-ONC
15.0000 mL | Freq: Once | TOPICAL | Status: AC
  Administered 2013-05-06: 15 mL via TOPICAL

## 2013-05-06 MED ORDER — FLUCONAZOLE 100 MG PO TABS: ORAL_TABLET | ORAL | Status: DC

## 2013-05-06 MED ORDER — NICOTINE 14 MG/24HR TD PT24: 1.0000 | MEDICATED_PATCH | TRANSDERMAL | Status: DC

## 2013-05-06 MED ORDER — MAGIC MOUTHWASH W/LIDOCAINE: ORAL | Status: DC

## 2013-05-06 MED ORDER — NICOTINE 21 MG/24HR TD PT24: 1.0000 | MEDICATED_PATCH | TRANSDERMAL | Status: DC

## 2013-05-06 MED ORDER — HYDROCODONE-ACETAMINOPHEN 7.5-325 MG/15ML PO SOLN: 15.0000 mL | ORAL | Status: DC | PRN

## 2013-05-06 MED ORDER — NICOTINE 7 MG/24HR TD PT24: 1.0000 | MEDICATED_PATCH | TRANSDERMAL | Status: DC

## 2013-05-06 NOTE — Addendum Note (Signed)
Encounter addended by: Delynn Flavin, RN on: 05/06/2013  7:52 PM<BR>     Documentation filed: Orders, Charges VN, Inpatient MAR

## 2013-05-06 NOTE — Progress Notes (Signed)
Met with pt and his wife during scheduled appt with Dr. Basilio Cairo.  Provided overview of mask trial including presentation of the standard closed-face and open-faced masks.  Provided tour of RT area so patient aware of where he will be simulated and receive RTs;  pt and wife expressed appreciation.  Navigating as L1 (new) patient.  Young Berry, RN, BSN, [facility] Head & Neck Oncology Navigator [phone]

## 2013-05-06 NOTE — Progress Notes (Signed)
Please see the Nurse Progress Note in the MD Initial Consult Encounter for this patient. 

## 2013-05-06 NOTE — Telephone Encounter (Signed)
Patient called due to bad headache. States PCP is out of town until Friday. Taking lortab solution for pain and has run out. Needs refill

## 2013-05-06 NOTE — Progress Notes (Signed)
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 male who has a history of long-term smoking. He had a 2 year history of progressive hoarseness. Imaging and physical exam by otolaryngology showed a base of tongue mass, possible vocal cord mass.  CT neck on 8-28 showed 1. Asymmetry of the larynx not typical in appearance for right vocal  cord paralysis, it thus suspicious for a small right  glottic/supraglottic mass measuring 8 x 7 x 11 mm.  2. No cervical lymphadenopathy.  3. Mild asymmetry in the oral pharynx at the right glossal tonsillar  sulcus. No discrete mass, but recommend direct visualization.   Laryngoscopy was performed by Dr. Annalee Genta on 10-14. Examination of the oropharynx revealed lingular tonsillar hypertrophy at the level of the valleculum. Examination of the larynx revealed a large exophytic mass involving the mid aspect of the right true vocal cord, the left true vocal cord appeared normal. Anterior aspect of the right vocal cord in the anterior commissure appeared normal. Biopsies revealed: 1. Vocal cord, biopsy, right - INVASIVE SQUAMOUS CELL CARCINOMA, MODERATELY DIFFERENTIATED. 2. Tongue, biopsy, base, midline - REACTIVE SQUAMOUS MUCOSA WITH CHRONIC INFLAMMATION AND LYMPHOID TISSUE. - NO MALIGNANCY IDENTIFIED.  He has seen Dr. Bertis Ruddy and has been scheduled for speech and swallowing therapy as well as a PET scan and a dental referral. Has poor dentition.  He has lost about 5 pounds recently. He has mild swallowing difficulties as well as a sore throat which were exacerbated by recent biopsies but no sensation of  choking. He denies any cough.  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    PAST SURGICAL HISTORY: Past Surgical History  Procedure Laterality Date  . Tonsillectomy    . [place] teeth    . Direct laryngoscopy Right 04/24/2013    Procedure: DIRECT LARYNGOSCOPY and BIOPSY OF TONGUE;  Surgeon: Osborn Coho, MD;  Location[facility] OR;  Service: ENT;  Laterality: Right;  . Microlaryngoscopy Right 04/24/2013    Procedure: MICROLARYNGOSCOPY and RIGHT VOCAL CORD BIOPSY;  Surgeon: Osborn Coho, MD;  Location: [facility] OR;  Service: ENT;  Laterality: Right;    FAMILY HISTORY: family history is not on file.  SOCIAL HISTORY:  reports that he has been smoking Cigarettes.  He has a 68 pack-year smoking history. He does not have any smokeless tobacco history on file. He reports that he does not drink alcohol or use illicit drugs.  ALLERGIES: Review of patient's allergies indicates no known allergies.  MEDICATIONS:  Current Outpatient Prescriptions  Medication Sig Dispense Refill  . Hydrocodone-Acetaminophen (LORTAB) 10-300 MG/15ML SOLN Take 5-10 mLs by mouth every 4 (four) hours as needed (sore throat).  120 mL  0  . levothyroxine (SYNTHROID, LEVOTHROID) 50 MCG tablet Take 50 mcg by mouth daily.       No current facility-administered medications for this encounter.    REVIEW OF SYSTEMS:  Notable for that above.   PHYSICAL EXAM:  height is 5' 11.5" (1.816 m) and weight is 167 lb 12.8 oz (76.114 kg). His temperature is 98.2 F (36.8  C). His blood pressure is 116/71 and his pulse is 84. His oxygen saturation is 97%.   General: Alert and oriented, in no acute distress HEENT: Head is normocephalic. Pupils are equally round and reactive to light. Extraocular movements are intact. Oropharynx is clear. Poor dentition.  Neck: Neck is supple, no palpable  cervical or supraclavicular lymphadenopathy. Heart: Regular in rate and rhythm with no murmurs, rubs, or gallops. Chest: Clear to auscultation bilaterally, with no rhonchi, wheezes, or rales. Abdomen: Soft, nontender, nondistended, with no rigidity or guarding. Extremities: No cyanosis or edema. Lymphatics: No concerning lymphadenopathy. Skin: No concerning lesions. Musculoskeletal: symmetric strength and muscle tone throughout. Neurologic: Cranial nerves II through XII are grossly intact. No obvious focalities. Speech is hoarse but  fluent. Coordination is intact. Psychiatric: Judgment and insight are intact. Affect is appropriate.  Laryngoscopy: thrush in pharynx.  No obvious tumor appreciated in pharynx/larynx.  Vocal cords symmetrically mobile. I suspect a large portion of the tumor was removed at biopsy.   ECOG =  0   LABORATORY DATA:  Lab Results  Component Value Date   WBC 6.3 04/22/2013   HGB 13.9 04/22/2013   HCT 39.5 04/22/2013   MCV 95.0 04/22/2013   PLT 195 04/22/2013   CMP     Component Value Date/Time   NA 138 03/17/2013 1324   K 4.0 03/17/2013 1324   CL 103 03/17/2013 1324   CO2 27 03/17/2013 1324   GLUCOSE 89 03/17/2013 1324   BUN 16 03/17/2013 1324   CREATININE 1.27 03/17/2013 1324   CALCIUM 9.3 03/17/2013 1324   PROT 7.3 03/17/2013 1324   ALBUMIN 4.5 03/17/2013 1324   AST 18 03/17/2013 1324   ALT 16 03/17/2013 1324   ALKPHOS 45 03/17/2013 1324   BILITOT 0.6 03/17/2013 1324        RADIOGRAPHY: Dg Chest 2 View  04/22/2013   CLINICAL DATA:  Pre admit for vocal cord surgery. Smoker. Hoarseness.  EXAM: CHEST  2 VIEW  COMPARISON:  None.  FINDINGS: Mild hyperinflation. Midline trachea. Normal heart size and mediastinal contours. No pleural effusion or pneumothorax. Mild biapical pleural parenchymal scarring. Clear lungs.  IMPRESSION: Mild hyperinflation and pleural-parenchymal scarring at the apices. No acute findings.   Electronically Signed   By: Jeronimo Greaves   On: 04/22/2013  16:05      IMPRESSION/PLAN:  This patient has what appears to be clinical T1aN0 glottic cancer. There was initially a concern of a base of tongue lesion but according to documentation by his otolaryngologist it seems that this was just benign tissue swelling and his biopsy was negative. Nevertheless Dr. Bertis Ruddy as ordered a PET scan. I will see if this can be moved up as it has been  scheduled not until the end of the month. I told the patient that if for some reason he does have 2 primaries, his treatment could require chemotherapy as well as radiation. We talked about the difference between the side effects of treating an early stage glottic cancer versus oropharyngeal cancer in addition to a second primary in the glottis.  The patient will be reviewed at tumor board tomorrow. I left a message with Dr. Thurmon Fair nurse so he and I can touch base re: his clinical suspicions.  I concur with a referral to dentistry as he has very poor dentition. He would definitely benefit from a dental evaluation, even if it turns out that his oral cavity is not directly radiated.  I concur with referral to speech/swallow therapy.  He admits that he does smoke ~2ppd. I told him this is carcinogenic and probably caused his cancer. We talked about the help line; he is aware of it but hasn't called it yet. He is interested to start nicotine patches. I will make that Rx.  He also had thrush apparent on laryngoscopy and I will prescribe fluconazole for that. He has some throat pain and I will prescribe Magic mouthwash with nystatin and lidocaine.   If this remains early stage glottic cancer, I recommend external beam radiotherapy to a dose of 63 Gray in 28 fractions at 2.25 gray per fraction. I would treat with opposed lateral beams, extending from the thyroid notch superiorly to the bottom of the cricoid inferiorly,  from the anterior VB to 1cm flash beyond the skin. I will make sure that the dose the anterior  commissure it is adequate.  We discussed the risks benefits and side effects of laryngeal radiotherapy which include but are not necessarily limited to fatigue, skin irritation and esophagitis in the acute setting. He understands that late effects may include rare damage to the internal tissues including the cartilage, larynx, and esophagus. He is enthusiastic about proceeding. A consent form has been signed and placed in his chart. We will probably simulate next week, anticipating starting treatment a week thereafter.  The patient was offered enrollment on our single institutional trial investigating open-faced vs close-face head/shoulder masks used to immobilize patients during head and neck radiotherapy. The patient has elected to enroll on this trial.  In regards to the trial, the patient has voluntarily signed copies of the consent forms and all trial related questions were answered.   I spent 55 minutes  face to face with the patient and more than 50% of that time was spent in counseling and/or coordination of care.    __________________________________________   Lonie Peak, MD

## 2013-05-07 ENCOUNTER — Ambulatory Visit (HOSPITAL_COMMUNITY): Payer: No Typology Code available for payment source | Admitting: Dentistry

## 2013-05-07 ENCOUNTER — Telehealth: Payer: Self-pay | Admitting: Hematology and Oncology

## 2013-05-07 ENCOUNTER — Encounter (HOSPITAL_COMMUNITY): Payer: Self-pay | Admitting: Dentistry

## 2013-05-07 ENCOUNTER — Other Ambulatory Visit: Payer: Self-pay | Admitting: *Deleted

## 2013-05-07 VITALS — BP 113/74 | HR 69 | Temp 98.2°F

## 2013-05-07 DIAGNOSIS — K08409 Partial loss of teeth, unspecified cause, unspecified class: Secondary | ICD-10-CM

## 2013-05-07 DIAGNOSIS — C329 Malignant neoplasm of larynx, unspecified: Secondary | ICD-10-CM

## 2013-05-07 DIAGNOSIS — K083 Retained dental root: Secondary | ICD-10-CM

## 2013-05-07 DIAGNOSIS — K030 Excessive attrition of teeth: Secondary | ICD-10-CM

## 2013-05-07 DIAGNOSIS — K036 Deposits [accretions] on teeth: Secondary | ICD-10-CM

## 2013-05-07 DIAGNOSIS — M264 Malocclusion, unspecified: Secondary | ICD-10-CM

## 2013-05-07 DIAGNOSIS — K029 Dental caries, unspecified: Secondary | ICD-10-CM

## 2013-05-07 DIAGNOSIS — IMO0002 Reserved for concepts with insufficient information to code with codable children: Secondary | ICD-10-CM

## 2013-05-07 DIAGNOSIS — K0530 Chronic periodontitis, unspecified: Secondary | ICD-10-CM

## 2013-05-07 DIAGNOSIS — K0889 Other specified disorders of teeth and supporting structures: Secondary | ICD-10-CM

## 2013-05-07 DIAGNOSIS — M2632 Excessive spacing of fully erupted teeth: Secondary | ICD-10-CM

## 2013-05-07 DIAGNOSIS — Z0189 Encounter for other specified special examinations: Secondary | ICD-10-CM

## 2013-05-07 NOTE — Patient Instructions (Signed)
RADIATION THERAPY AND DECISIONS REGARDING YOUR TEETH  Xerostomia (dry mouth) Your salivary glands may be in the filed of radiation.  Radiation may include all or part of your saliva glands.  This will cause your saliva to dry up and you will have a dry mouth.  The dry mouth will be for the rest of your life unless your radiation oncologist tells you otherwise.  Your saliva has many functions:  Saliva wets your tongue for speaking.  It coats your teeth and the inside of your mouth for easier movement.  It helps with chewing and swallowing food.  It helps clean away harmful acid and toxic products made by the germs in your mouth, therefore it helps prevent cavities.  It kills some germs in your mouth and helps to prevent gum disease.  It helps to carry flavor to your taste buds.  Once you have lost your saliva you will be at higher risk for tooth decay and gum disease.  What can be done to help improve your mouth when there's not enough saliva:  1.  Your dentist may give a prescription for Salagen.  It will not bring back all of your saliva but may bring back some of it.  Also your saliva may be thick and ropy or white and foamy. It will not feel like it use to feel.  2.  You will need to swish with water every time your mouth feels dry.  YOU CANNOT suck on any cough drops, mints, lemon drops, candy, vitamin C or any other products.  You cannot use anything other than water to make your mouth feel less dry.  If you want to drink anything else you have to drink it all at once and brush afterwards.  Be sure to discuss the details of your diet habits with your dentist or hygienist.  Radiation caries: This is decay that happens very quickly once your mouth is very dry due to radiation therapy.  Normally cavities take six months to two years to become a problem.  When you have dry mouth cavities may take as little as eight weeks to cause you a problem.  This is why dental check ups every two  months are necessary as long as you have a dry mouth. Radiation caries typically, but not always, start at your gum line where it is hard to see the cavity.  It is therefore also hard to fill these cavities adequately.  This high rate of cavities happens because your mouth no longer has saliva and therefore the acid made by the germs starts the decay process.  Whenever you eat anything the germs in your mouth change the food into acid.  The acid then burns a small hole in your tooth.  This small hole is the beginning of a cavity.  If this is not treated then it will grow bigger and become a cavity.  The way to avoid this hole getting bigger is to use fluoride every evening as prescribed by your dentist.  You have to make sure that your teeth are very clean before you use the fluoride.  This fluoride in turn will strengthen your teeth and prepare them for another day of fighting acid.  If you develop radiation caries many times the damage is so large that you will have to have all your teeth removed.  This could be a big problem if some of these teeth are in the field of radiation.  Further details of why this could be   a big problem will follow.  (See Osteoradionecrosis).  Loss of taste (dysgeusia) This happens to varying degrees once you've had radiation therapy to your jaw region.  Many times taste is not completely lost but becomes limited.  The loss of taste is mostly due to radiation affecting your taste buds.  However if you have no saliva in your mouth to carry the flavor to your taste buds it would be difficult for your taste buds to taste anything.  That is why using water or a prescription for Salagen prior to meals and during meals may help with some of the taste.  Keep in mind that taste generally returns very slowly over the course of several months or several years after radiation therapy.  Don't give up hope.  Trismus According to your Radiation Oncologist your TMJ or jaw joints are going to be  partially or fully in the field of radiation.  This means that over time the muscles that help you open and close your mouth may get stiff.  This will potentially result in your not being able to open your mouth wide enough or as wide as you can open it now.  Le me give you an example of how slowly this happens and how unaware people are of it.  A gentlemen that had radiation therapy two years ago came back to me complaining that bananas are just too large for him to be able to fit them in between his teeth.  He was not able to open wide enough to bite into a banana.  This happens slowly and over a period of time.  What do we do to try and prevent this?  Your dentist will probably give you a stack of sticks called a trismus exercise device .  This stack will help your remind your muscles and your jaw joint to open up to the same distance every day.  Use these sticks every morning when you wake up according to the instructions given by the dentist.   You must use these sticks for at least one to two years after radiation therapy.  The reason for that is because it happens so slowly and keeps going on for about two years after radiation therapy.  Your hospital dentist will help you monitor your mouth opening and make sure that it's not getting smaller.  Osteoradionecrosis (ORN) This is a condition where your jaw bone after having had radiation therapy becomes very dry.  It has very little blood supply to keep it alive.  If you develop a cavity that turns into an abscess or an infection then the jaw bone does not have enough blood supply to help fight the infection.  At this point it is very likely that the infection could cause the death of your jaw bone.  When you have dead bone it has to be removed.  Therefore you might end up having to have surgery to remove part of your jaw bone, the part of the jaw bone that has been affected.   Healing is also a problem if you are to have surgery in the areas where the bone  has had radiation therapy.  The same reasons apply.  If you have surgery you need more blood supply which is not available.  When blood supply and oxygen are not available again, there is a chance for the bone to die.  Occasionally ORN happens on its own with no obvious reason.  This is quite rare.  We believe that   patients who continue to smoke and/or drink alcohol have a higher chance of having this bone problem.  Therefore once your jaw bone has had radiation therapy if there are any teeth in that area, you should never have them pulled.  You should also never have any surgery on your teeth or gums in that area unless the oral surgeon or Periodontist is aware of your history of radiation. There is some expensive management techniques that might be used to limit your risks.  The risks for ORN either from infection or spontaneous ( or on it's own) are life long.    TRISMUS  Trismus is a condition where the jaw does not allow the mouth to open as wide as it usually does.  This can happen almost suddenly, or in other cases the process is so slow, it is hard to notice it-until it is too far along.  When the jaw joints and/or muscles have been exposed to radiation treatments, the onset of Trismus is very slow.  This is because the muscles are losing their stretching ability over a long period of time, as long as 2 YEARS after the end of radiation.  It is therefore important to exercise these muscles and joints.  TRISMUS EXERCISES   Stack of tongue depressors measuring the same or a little less than the last documented MIO (Maximum Interincisal Opening).  Secure them with a rubber band on both ends.  Place the stack in the patient's mouth, supporting the other end.  Allow 30 seconds for muscle stretching.  Rest for a few seconds.  Repeat 3-5 times  For all radiation patients, this exercise is recommended in the mornings and evenings unless otherwise instructed.  The exercise should be done for  a period of 2 YEARS after the end of radiation.  MIO should be checked routinely on recall dental visits by the general dentist or the hospital dentist.  The patient is advised to report any changes, soreness, or difficulties encountered when doing the exercises.  FLUORIDE TRAYS PATIENT INSTRUCTIONS    Obtain prescription from the pharmacy.  Don't be surprised if it needs to be ordered.   Be sure to let the pharmacy know when you are close to needing a new refill for them to have it ready for you without interruption of Fluoride use.   The best time to use your Fluoride is before bed time.   You must brush your teeth very well and floss before using the Fluoride in order to get the best use out of the Fluoride treatments.   Place 1 drop of Fluoride gel per tooth in the tray.   Place the tray on your lower teeth and/or your upper teeth.  Make sure the trays are seated all the way.  Remember, they only fit one way on your teeth.   Insert for 5 full minutes.   At the end of the 5 minutes, take the trays out.  SPIT OUT excess. .    Do NOT rinse your mouth!    Do NOT eat or drink after treatments for at least 30 minutes.  This is why the best time for your treatments is before bedtime.    Clean the inside of your Fluoride trays using COLD WATER and a toothbrush.    In order to keep your Trays from discoloring and free from odors, soak them overnight in denture cleaners such as Efferdent.  Do not use bleach or non denture products.    Store the trays   in a safe dry place AWAY from any heat until your next treatment.    Bring the trays with you for your next dental check-up.  The dentist will confirm their fit.    If anything happens to your Fluoride trays, or they don't fit as well after any dental work, please let us know as soon as possible. 

## 2013-05-07 NOTE — Telephone Encounter (Signed)
Per message from desk nurse 10/10 checked to see if pet could be done @ [facility] sooner due to 1st avail @ [facility] 10/24. Pet scan not able to be scheduled @ [facility] as pt has no ins. Pt has the community discount card but no actual ins - confirmed by Bonita Quin when orders was sent to get precert for [employer]. Desk nurse informed. Current appts remain the same - desk nurse will inform me if appts need to be adjusted.

## 2013-05-07 NOTE — Progress Notes (Signed)
DENTAL CONSULTATION  Date of Consultation:  05/07/2013 Patient Name:   FREELAND PRACHT Date of Birth:   December 28, 1965 Medical Record Number: [id number]  VITALS: BP 113/74  Pulse 69  Temp(Src) 98.2 F (36.8 C) (Oral)  CHIEF COMPLAINT: The patient was referred by Dr. Basilio Cairo for a preradiation therapy dental protocol examination.  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 Patient with anticipated radiation therapy and possible chemotherapy. Patient is now seen as part of a medically necessary preradiation therapy dental protocol examination.  The patient currently denies acute toothache, swellings, or abscesses. Patient was last seen by a dentist at least 5 years ago. The patient had a tooth pulled at that time with no subsequent complication. The patient denies having any regular primary dentist. Patient does not have partial dentures. The patient is interested in having " all my teeth pulled".    PROBLEM LIST: Patient Active Problem List   Diagnosis Date Noted  . 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 Hoarseness of voice 03/17/2013  . Headache(784.0) 03/17/2013     PMH: Past Medical History  Diagnosis Date  . Hypothyroidism   . Heart murmur     child  . Headache(784.0)   . Vocal cord cancer Dec 20, 1965  . Vocal cord cancer 05/01/2013    PSH: Past Surgical History  Procedure Laterality Date  . Tonsillectomy      as a child  . [place] teeth      age 24  . Direct laryngoscopy Right 04/24/2013    Procedure: DIRECT LARYNGOSCOPY and BIOPSY OF TONGUE;  Surgeon: Osborn Coho, MD;  Location: [facility] OR;  Service: ENT;  Laterality: Right;  . Microlaryngoscopy Right 04/24/2013    Procedure: MICROLARYNGOSCOPY and RIGHT VOCAL CORD BIOPSY;  Surgeon: Osborn Coho, MD;  Location: [facility] OR;  Service: ENT;  Laterality: Right;    ALLERGIES: No Known Allergies  MEDICATIONS: Current Outpatient  Prescriptions  Medication Sig Dispense Refill  . Alum & Mag Hydroxide-Simeth (MAGIC MOUTHWASH W/LIDOCAINE) SOLN 1part nystatin,1part Maaloxplus,1part benadryl,3part 2%viscous lidocaine. Swallow 10mL up to QID, before meals/bedtime prn sore throat  480 mL  4  . HYDROcodone-acetaminophen (HYCET) 7.5-325 mg/15 ml solution Take 15 mLs by mouth every 4 (four) hours as needed for pain.  120 mL  0  . levothyroxine (SYNTHROID, LEVOTHROID) 50 MCG tablet Take 50 mcg by mouth daily.      . fluconazole (DIFLUCAN) 100 MG tablet Take 2 tabs today, then one tab daily x 13 days for yeast infection  15 tablet  0  . nicotine (NICODERM CQ) 14 mg/24hr patch Place 1 patch onto the skin daily. apply 21 mg patch daily x6wk, then 14 mg patch daily x2wk, then 7 mg patch daily x2wk  14 patch  0  . nicotine (NICODERM CQ) 21 mg/24hr patch Place 1 patch onto the skin daily. apply 21 mg patch daily x6wk, then 14 mg patch daily x2wk, then 7 mg patch daily x2wk  14 patch  2  . nicotine (NICODERM CQ) 7 mg/24hr patch Place 1 patch onto the skin daily. apply 21 mg patch daily x6wk, then 14 mg patch daily x2wk, then 7 mg patch daily x2wk  14 patch  0   No current facility-administered medications for this visit.    LABS: Lab Results  Component Value Date   WBC 6.3 04/22/2013   HGB 13.9 04/22/2013   HCT 39.5 04/22/2013   MCV 95.0  04/22/2013   PLT 195 04/22/2013      Component Value Date/Time   NA 138 03/17/2013 1324   K 4.0 03/17/2013 1324   CL 103 03/17/2013 1324   CO2 27 03/17/2013 1324   GLUCOSE 89 03/17/2013 1324   BUN 16 03/17/2013 1324   CREATININE 1.27 03/17/2013 1324   CALCIUM 9.3 03/17/2013 1324   No results found for this basename: INR, PROTIME   No results found for this basename: PTT    SOCIAL HISTORY: History   Social History  . Marital Status: Married    Spouse Name: N/A    Number of Children: 3  . Years of Education: N/A   Occupational History  .  [employer]   Social  History Main Topics  . Smoking status: Current Every Day Smoker -- 2.00 packs/day for 34 years    Types: Cigarettes  . Smokeless tobacco: Never Used  . Alcohol Use: No  . Drug Use: No  . Sexual Activity: Not on file   Other Topics Concern  . Not on file   Social History Narrative   The patient is married. Patient has 3 children. 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   The patient has a history of smoking 2 packs of cigarette a day for 34 years. Patient current is trying to quit smoking. Patient denies use of alcohol. Patient denies use of other illicit drugs.    FAMILY HISTORY: Family History  Problem Relation Age of Onset  . Heart attack Father   . COPD Mother   . Emphysema Mother      REVIEW OF SYSTEMS: Reviewed with the patient and included in dental record.   DENTAL HISTORY: CHIEF COMPLAINT: The patient was referred by Dr. Basilio Cairo for a preradiation therapy dental protocol examination.  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 Patient with anticipated radiation therapy and possible chemotherapy. Patient is now seen as part of a medically necessary preradiation therapy dental protocol examination.  The patient currently denies acute toothache, swellings, or abscesses. Patient was last seen by a dentist at least 5 years ago. The patient had a tooth pulled at that time with no subsequent complication. The patient denies having any regular primary dentist. Patient does not have partial dentures. The patient is interested in having " all my teeth pulled".   DENTAL EXAMINATION:  GENERAL: The patient is a well-developed, well-nourished male in no acute distress. HEAD AND NECK: There is no palpable lymphadenopathy. The patient denies acute TMJ symptoms. INTRAORAL EXAM: The patient has normal saliva. There is no evidence of intraoral abscess formation. DENTITION: The patient is missing tooth numbers  1, 13, 14, 16, 17, 18, 19, 20, 21, 29, 30, 31, and 32.  Tooth numbers 6, 7, 10, 11 are present as retained root segments.  There is excessive attrition of the remaining mandibular anterior teeth. PERIODONTAL: The patient has chronic periodontitis with plaque and calculus accumulations, generalized gingival recession, generalized tooth mobility and moderate to severe bone loss. DENTAL CARIES/SUBOPTIMAL RESTORATIONS: There are multiple dental caries as per dental charting form. ENDODONTIC: The patient currently denies acute pulpitis symptoms. I do not see any periapical pathology associated with the remaining teeth. CROWN AND BRIDGE: The patient has porcelain fused to metal crowns on tooth numbers 8 and 9. Patient previously had crowns on tooth numbers 6, 7, 10, and 11. These crowns were lost to  the past 5-10 years by his report. PROSTHODONTIC: The patient has no partial dentures. OCCLUSION: The patient has a poor occlusal scheme secondary to multiple missing teeth, multiple retained root segments, deep overbite, and lack of replacement of missing teeth with dental prostheses.  RADIOGRAPHIC INTERPRETATION:  An orthopantogram was obtained today. This was supplemented with 10 upper and lower periapical radiographs. There are multiple missing teeth. There are multiple retained root segments. There are multiple dental caries noted. There is moderate to severe bone loss. There is radiographic calculus noted. There are multiple diastemas noted. There is excessive attrition of the mandibular anterior teeth. There is supra-eruption and drifting of the unopposed teeth into the edentulous areas. I do not see any periapical pathology or radiolucency.  ASSESSMENTS:  1. Squamous cell carcinoma of the right vocal cord 2. Preradiation therapy dental protocol 3. Chronic periodontitis 4. Plaque and calculus accumulations 5. Gingival recession 6. Generalized tooth mobility 7. Multiple missing teeth 8. Multiple  retained root segments 9. Dental caries 10. Excessive attrition of the mandible anterior teeth 11. No history of partial dentures 12. Supra-eruption and drifting of the unopposed teeth into the edentulous areas 13. Multiple diastemas 14. Deep overbite 15. Poor occlusal scheme and malocclusion  PLAN/RECOMMENDATIONS: 1. I discussed the risks, benefits, and complications of various treatment options with the patient in relationship to his medical and dental conditions, anticipated radiation therapy and possible chemotherapy, and chemoradiation therapy side effects to include xerostomia, radiation caries, trismus, mucositis, taste changes, gum and jawbone changes, and risk for infection and osteoradionecrosis. We discussed various treatment options to include no treatment,  extraction of remaining teeth with alveoloplasty, and pre-prosthetic surgery as indicated as indicated, periodontal therapy, dental restorations, root canal therapy, crown and bridge therapy, implant therapy, and replacement of missing teeth as indicated. The patient currently wishes to proceed with extraction of all remaining teeth with alveoloplasty and pre-prosthetic surgery as indicated in the operating room with general anesthesia. The patient will then follow the dentist of his choice for fabrication of upper and lower complete dentures. The patient is aware that Dr. Basilio Cairo may wish to proceed with radiation therapy first to treat the cancer followed by the dental extractions 2-3 months after radiation therapy. This decision concerning dental treatment will be made once the PET scan has been obtained on 05/14/2013. Dr. Basilio Cairo will reconsult dental medicine for the dental extractions at that time.   2. Discussion of findings with medical team and coordination of future medical and dental care as needed.  Charlynne Pander, DDS

## 2013-05-08 ENCOUNTER — Encounter (HOSPITAL_COMMUNITY): Payer: Self-pay

## 2013-05-08 ENCOUNTER — Encounter (HOSPITAL_COMMUNITY)
Admission: RE | Admit: 2013-05-08 | Discharge: 2013-05-08 | Disposition: A | Discharge status: Home / Self Care | Payer: No Typology Code available for payment source | Source: Ambulatory Visit | Attending: Hematology and Oncology | Admitting: Hematology and Oncology

## 2013-05-08 ENCOUNTER — Encounter: Payer: Self-pay | Admitting: *Deleted

## 2013-05-08 DIAGNOSIS — J383 Other diseases of vocal cords: Secondary | ICD-10-CM

## 2013-05-08 LAB — GLUCOSE, CAPILLARY: Glucose-Capillary: 95 mg/dL (ref 70–99)

## 2013-05-08 MED ORDER — FLUDEOXYGLUCOSE F - 18 (FDG) INJECTION
19.4000 | Freq: Once | INTRAVENOUS | Status: AC | PRN
  Administered 2013-05-08: 19.4 via INTRAVENOUS

## 2013-05-08 NOTE — Progress Notes (Signed)
[facility] Psychosocial Distress Screening Clinical Social Work  Clinical Social Work was referred by distress screening protocol.  The patient scored a 8 on the Psychosocial Distress Thermometer which indicates severe distress. Clinical Social Worker phoned Pt to assess for distress and other psychosocial needs. CSW left VM to call CSW as indicated.    Clinical Social Worker follow up needed: yes  If yes, follow up plan: CSW will attempt to see Pt on future appointments.   Doreen Salvage, LCSW Clinical Social Worker [facility] Wednesday, Thursday and Friday Phone: [phone] Fax: [phone]

## 2013-05-09 NOTE — Addendum Note (Signed)
Encounter addended by: Delynn Flavin, RN on: 05/09/2013  6:45 PM<BR>     Documentation filed: Charges VN

## 2013-05-12 ENCOUNTER — Encounter: Payer: Self-pay | Admitting: Hematology and Oncology

## 2013-05-12 ENCOUNTER — Encounter: Payer: Self-pay | Admitting: *Deleted

## 2013-05-12 ENCOUNTER — Ambulatory Visit (HOSPITAL_BASED_OUTPATIENT_CLINIC_OR_DEPARTMENT_OTHER): Payer: No Typology Code available for payment source | Admitting: Hematology and Oncology

## 2013-05-12 ENCOUNTER — Encounter: Payer: Self-pay | Admitting: Radiation Oncology

## 2013-05-12 VITALS — BP 118/72 | HR 73 | Temp 98.0°F | Resp 18 | Ht 71.5 in | Wt 164.5 lb

## 2013-05-12 DIAGNOSIS — C329 Malignant neoplasm of larynx, unspecified: Secondary | ICD-10-CM

## 2013-05-12 DIAGNOSIS — F17200 Nicotine dependence, unspecified, uncomplicated: Secondary | ICD-10-CM

## 2013-05-12 DIAGNOSIS — C320 Malignant neoplasm of glottis: Secondary | ICD-10-CM

## 2013-05-12 DIAGNOSIS — J029 Acute pharyngitis, unspecified: Secondary | ICD-10-CM

## 2013-05-12 DIAGNOSIS — K5900 Constipation, unspecified: Secondary | ICD-10-CM

## 2013-05-12 MED ORDER — MORPHINE SULFATE 15 MG PO TABS: 15.0000 mg | ORAL_TABLET | ORAL | Status: DC | PRN

## 2013-05-12 NOTE — Progress Notes (Signed)
I have looked at the patient's PET scan and discussed it with Dr. Annalee Genta, Dr. Bertis Ruddy and Dr. [name] of radiology. I personally looked at the images with Dr. [name] this afternoon. It shows bilateral subcentimeter level II lymph nodes that are indeterminant, but based on my discussion with Dr.Stahl, these lymph nodes could certainly be reactive such as due to his recent biopsies or due to poor dentition. There 2 small to reliably biopsy. Of note, the risk of lymph node metastases from stage I glottic cancer is less than 3%. For these reasons, and I do not think that further workup is warranted. I spoke with Dr. Bertis Ruddy and Dr. Annalee Genta about my plan is to treat the patient with partial neck irradiation and they are in agreement. Dr. Annalee Genta is not concerned about the pt's BOT - this was clinically/ pathologically negative, and PET confirms this.   Thus, [name] will not be receiving chemotherapy. We will proceed with simulation on Wednesday, October 22. Tooth extractions  (which are not necessary for his oncologic care ) will be held until the patient has completed radiation.  -----------------------------------  Leslie Peak, MD

## 2013-05-12 NOTE — Progress Notes (Signed)
[facility] OFFICE PROGRESS NOTE  Patient Care Team: Jeanann Lewandowsky, MD as PCP - General (Internal Medicine) Dennis Bast, RN as Registered Nurse  DIAGNOSIS: Vocal cord squamous cell carcinoma  SUMMARY OF ONCOLOGIC HISTORY:   Vocal cord cancer   05/01/2013 Initial Diagnosis Vocal cord cancer    Chemotherapy     INTERVAL HISTORY: [name] 47 y.o. male returns for return visit in followup on his PET scan. In the meantime he is to have persistent sore throat. He is attempting to quit smoking is down to one packs of cigarettes per day. He is using the nicotine patches. Denies any new lymphadenopathy.  I have reviewed the past medical history, past surgical history, social history and family history with the patient and they are unchanged from previous note.  ALLERGIES:  has No Known Allergies.  MEDICATIONS:  Current Outpatient Prescriptions  Medication Sig Dispense Refill  . Alum & Mag Hydroxide-Simeth (MAGIC MOUTHWASH W/LIDOCAINE) SOLN 1part nystatin,1part Maaloxplus,1part benadryl,3part 2%viscous lidocaine. Swallow 10mL up to QID, before meals/bedtime prn sore throat  480 mL  4  . fluconazole (DIFLUCAN) 100 MG tablet Take 2 tabs today, then one tab daily x 13 days for yeast infection  15 tablet  0  . HYDROcodone-acetaminophen (HYCET) 7.5-325 mg/15 ml solution Take 15 mLs by mouth every 4 (four) hours as needed for pain.  120 mL  0  . levothyroxine (SYNTHROID, LEVOTHROID) 50 MCG tablet Take 50 mcg by mouth daily.      . nicotine (NICODERM CQ) 14 mg/24hr patch Place 1 patch onto the skin daily. apply 21 mg patch daily x6wk, then 14 mg patch daily x2wk, then 7 mg patch daily x2wk  14 patch  0  . nicotine (NICODERM CQ) 21 mg/24hr patch Place 1 patch onto the skin daily. apply 21 mg patch daily x6wk, then 14 mg patch daily x2wk, then 7 mg patch daily x2wk  14 patch  2  . nicotine (NICODERM CQ) 7 mg/24hr patch Place 1 patch onto the skin daily. apply 21 mg  patch daily x6wk, then 14 mg patch daily x2wk, then 7 mg patch daily x2wk  14 patch  0  . morphine (MSIR) 15 MG tablet Take 1 tablet (15 mg total) by mouth every 4 (four) hours as needed for pain.  30 tablet  0   No current facility-administered medications for this visit.    REVIEW OF SYSTEMS:   Constitutional: Denies fevers, chills or abnormal weight loss Behavioral/Psych: Mood is stable, no new changes  All other systems were reviewed with the patient and are negative.  PHYSICAL EXAMINATION: ECOG PERFORMANCE STATUS: 0 - Asymptomatic  Filed Vitals:   05/12/13 1355  BP: 118/72  Pulse: 73  Temp: 98 F (36.7 C)  Resp: 18   Filed Weights   05/12/13 1355  Weight: 164 lb 8 oz (74.617 kg)    GENERAL:alert, no distress and comfortable Musculoskeletal:no cyanosis of digits and no clubbing  NEURO: alert & oriented x 3 with fluent speech except for persistent hoarseness, no focal motor/sensory deficits  LABORATORY DATA:  I have reviewed the data as listed    Component Value Date/Time   NA 138 03/17/2013 1324   K 4.0 03/17/2013 1324   CL 103 03/17/2013 1324   CO2 27 03/17/2013 1324   GLUCOSE 89 03/17/2013 1324   BUN 16 03/17/2013 1324   CREATININE 1.27 03/17/2013 1324   CALCIUM 9.3 03/17/2013 1324   PROT 7.3 03/17/2013 1324   ALBUMIN  4.5 03/17/2013 1324   AST 18 03/17/2013 1324   ALT 16 03/17/2013 1324   ALKPHOS 45 03/17/2013 1324   BILITOT 0.6 03/17/2013 1324    No results found for this basename: SPEP,  UPEP,   kappa and lambda light chains    Lab Results  Component Value Date   WBC 6.3 04/22/2013   NEUTROABS 4.1 03/17/2013   HGB 13.9 04/22/2013   HCT 39.5 04/22/2013   MCV 95.0 04/22/2013   PLT 195 04/22/2013      Chemistry      Component Value Date/Time   NA 138 03/17/2013 1324   K 4.0 03/17/2013 1324   CL 103 03/17/2013 1324   CO2 27 03/17/2013 1324   BUN 16 03/17/2013 1324   CREATININE 1.27 03/17/2013 1324      Component Value Date/Time   CALCIUM 9.3 03/17/2013 1324    ALKPHOS 45 03/17/2013 1324   AST 18 03/17/2013 1324   ALT 16 03/17/2013 1324   BILITOT 0.6 03/17/2013 1324     RADIOGRAPHIC STUDIES: I have personally reviewed the radiological images as listed and agreed with the findings in the report. I reviewed the PET/CT scan with him and his wife. There may be concerned about possible regional lymph node involvement which is subtle on the imaging study   ASSESSMENT:  Squamous cell carcinoma of the vocal cord  PLAN:  #1 squamous cell carcinoma of the vocal cord #2 possible lymph node involvement Discussion with the radiation oncologist, the patient and our navigator. I am not sure whether the questionable PET positivity is of significance. Certainly we can discuss his case further at the next ENT tumor board. If we felt that the lymph nodes are involved, then I would recommend concurrent chemoradiation therapy. At that point in time, he would need placement of Infuse-a-Port and feeding tube. I will touch base with the patient again once we have further discussion about the next plan of care. #3 nicotine dependency The patient apparently is attempting to quit smoking. I congratulated his effort. #4 sore throat I recommend increasing his pain medications to morphine. We discussed about narcotic refill policy. Also reminded him risk of constipation. #5 of constipation The patient has laxatives to take as needed.  No orders of the defined types were placed in this encounter.   All questions were answered. The patient knows to call the clinic with any problems, questions or concerns. No barriers to learning was detected. I spent 40 minutes counseling the patient face to face. The total time spent in the appointment was 60 minutes and more than 50% was on counseling and review of test results     [facility], Aidaly Cordner, MD 05/12/2013 2:53 PM

## 2013-05-13 ENCOUNTER — Telehealth: Payer: Self-pay | Admitting: *Deleted

## 2013-05-13 NOTE — Telephone Encounter (Signed)
Informed wife of request sent to Dr. Basilio Cairo to manage pt's pain since pt will be seeing Dr. Basilio Cairo more frequently in the near future and no need to see Dr. Bertis Ruddy for a while.  She verbalized understanding.

## 2013-05-13 NOTE — Telephone Encounter (Signed)
Recommend pt to discuss further pain management with radiation team

## 2013-05-13 NOTE — Telephone Encounter (Signed)
Wife called to report Morphine is making pt vomit.  She states pt has had percocet in the past after dental procedure and this did not cause any nausea or vomiting.

## 2013-05-14 ENCOUNTER — Ambulatory Visit
Admission: RE | Admit: 2013-05-14 | Discharge: 2013-05-14 | Disposition: A | Discharge status: Home / Self Care | Payer: No Typology Code available for payment source | Source: Ambulatory Visit | Attending: Radiation Oncology | Admitting: Radiation Oncology

## 2013-05-14 ENCOUNTER — Inpatient Hospital Stay (HOSPITAL_COMMUNITY): Admission: RE | Admit: 2013-05-14 | Payer: Self-pay | Source: Ambulatory Visit

## 2013-05-14 ENCOUNTER — Telehealth: Payer: Self-pay | Admitting: *Deleted

## 2013-05-14 ENCOUNTER — Ambulatory Visit: Admission: RE | Admit: 2013-05-14 | Payer: No Typology Code available for payment source | Source: Ambulatory Visit

## 2013-05-14 DIAGNOSIS — R490 Dysphonia: Secondary | ICD-10-CM | POA: Insufficient documentation

## 2013-05-14 DIAGNOSIS — C320 Malignant neoplasm of glottis: Secondary | ICD-10-CM | POA: Insufficient documentation

## 2013-05-14 DIAGNOSIS — K219 Gastro-esophageal reflux disease without esophagitis: Secondary | ICD-10-CM | POA: Insufficient documentation

## 2013-05-14 DIAGNOSIS — F17200 Nicotine dependence, unspecified, uncomplicated: Secondary | ICD-10-CM | POA: Insufficient documentation

## 2013-05-14 DIAGNOSIS — Z510 Encounter for antineoplastic radiation therapy: Secondary | ICD-10-CM | POA: Insufficient documentation

## 2013-05-14 DIAGNOSIS — K121 Other forms of stomatitis: Secondary | ICD-10-CM | POA: Insufficient documentation

## 2013-05-14 DIAGNOSIS — Z79899 Other long term (current) drug therapy: Secondary | ICD-10-CM | POA: Insufficient documentation

## 2013-05-14 DIAGNOSIS — J029 Acute pharyngitis, unspecified: Secondary | ICD-10-CM | POA: Insufficient documentation

## 2013-05-14 MED ORDER — OXYCODONE HCL 5 MG/5ML PO SOLN: 5.0000 mg | ORAL | Status: DC | PRN

## 2013-05-14 MED ORDER — OXYCODONE HCL 5 MG PO TABS: 5.0000 mg | ORAL_TABLET | ORAL | Status: DC | PRN

## 2013-05-14 NOTE — Addendum Note (Signed)
Encounter addended by: Lonie Peak, MD on: 05/14/2013  4:14 PM<BR>     Documentation filed: Notes Section

## 2013-05-14 NOTE — Progress Notes (Addendum)
  Radiation Oncology         [phone]) [phone] ________________________________  Name: [name] MRN: [id number]  Date: 05/14/2013  DOB: August 05, 1965  SIMULATION AND TREATMENT PLANNING NOTE  outpatient  DIAGNOSIS:  [id number] Glottic Cancer  NARRATIVE:  The patient was brought to the CT Simulation planning suite.  Identity was confirmed.  All relevant records and images related to the planned course of therapy were reviewed.  The patient freely provided informed written consent to proceed with treatment after reviewing the details related to the planned course of therapy. The consent form was witnessed and verified by the simulation staff.    Then, the patient was set-up in a stable reproducible  supine position for radiation therapy.  CT images were obtained.  Surface markings were placed.  The CT images were loaded into the planning software.    TREATMENT PLANNING NOTE: Treatment planning then occurred.  The radiation prescription was entered and confirmed.    A total of 1 medically necessary complex treatment devices were fabricated and supervised by me - aquaplast mask.  If wedges are used, this will be 2 additional complex devices to equal 3 total.  I have requested : isodose plan, dose calc's.     The patient will receive 63 Gy in 28 fractions with opposed laterals. -----------------------------------  Lonie Peak, MD

## 2013-05-14 NOTE — Telephone Encounter (Signed)
Called and informed [name] that he will not need any IV access, for contrast today, during his simulation(planning) appointment as ordered by Dr. Basilio Cairo.  Requested he arrive at 1:45 pm for check prior to his 2 pm appointment.  He agreed.

## 2013-05-19 ENCOUNTER — Telehealth: Payer: Self-pay | Admitting: *Deleted

## 2013-05-19 ENCOUNTER — Ambulatory Visit
Admission: RE | Admit: 2013-05-19 | Discharge: 2013-05-19 | Disposition: A | Discharge status: Home / Self Care | Payer: No Typology Code available for payment source | Source: Ambulatory Visit | Attending: Radiation Oncology | Admitting: Radiation Oncology

## 2013-05-19 ENCOUNTER — Encounter (HOSPITAL_COMMUNITY): Payer: No Typology Code available for payment source

## 2013-05-19 ENCOUNTER — Encounter: Payer: Self-pay | Admitting: *Deleted

## 2013-05-19 VITALS — BP 111/71 | HR 70 | Temp 97.7°F | Ht 71.5 in | Wt 165.6 lb

## 2013-05-19 DIAGNOSIS — C320 Malignant neoplasm of glottis: Secondary | ICD-10-CM

## 2013-05-19 MED ORDER — BIAFINE EX EMUL
CUTANEOUS | Status: DC | PRN
  Administered 2013-05-19: 19:00:00 via TOPICAL

## 2013-05-19 NOTE — Progress Notes (Signed)
Simulation Verification Note Glottis Outpatient   The patient was brought to the treatment unit and placed in the planned treatment position. The clinical setup was verified. Then port films were obtained and uploaded to the radiation oncology medical record software.  The treatment beams were carefully compared against the planned radiation fields. The position location and shape of the radiation fields was reviewed. They targeted volume of tissue appears to be appropriately covered by the radiation beams. Organs at risk appear to be excluded as planned.  Based on my personal review, I approved the simulation verification. The patient's treatment will proceed as planned.  -----------------------------------  Lonie Peak, MD

## 2013-05-19 NOTE — Addendum Note (Signed)
Encounter addended by: Delynn Flavin, RN on: 05/19/2013  6:55 PM<BR>     Documentation filed: Inpatient MAR

## 2013-05-19 NOTE — Progress Notes (Signed)
   Weekly Management Note:  outpatient Current Dose:  2.25 Gy  Projected Dose: 63 Gy   Narrative:  The patient presents for routine under treatment assessment.  CBCT/MVCT images/Port film x-rays were reviewed.  The chart was checked. Still smoking, has not tried patches yet as Rx'd  Physical Findings:  height is 5' 11.5" (1.816 m) and weight is 165 lb 9.6 oz (75.116 kg). His temperature is 97.7 F (36.5 C). His blood pressure is 111/71 and his pulse is 70.  NAD  Impression:  The patient is tolerating radiotherapy.  Plan:  Continue radiotherapy as planned. Discussed option of trying E cigarettes first, then transitioning to patches, then quitting. He will think about this. Discussed that long term effects of E cigarettes are not yet known. Discussed risk of second primary cancers, and decreased chance of cure with continued smoking.  ________________________________   Lonie Peak, M.D.

## 2013-05-19 NOTE — Progress Notes (Signed)
Mr. Fancher has received his 1st fraction to his Larynx.  Education today regarding side-effect management of pain ( sore throat), difficulty swallowing, skin reactions and fatigue, and cautioned to use electric razor when shaving so as not to irritate skin.Marland Kitchen  Also instructed to perform jaw exercises to prevent trismus.    Given Radiaton therapy and You booklet with appropriate pages marked.  Also given Biafine lotion to use BID, daily after treatment and at bedtime.  He and family stated understanding of the information.  Teach back method.  Given this RN's business card.

## 2013-05-19 NOTE — Progress Notes (Signed)
Met with patient during scheduled appt with Dr. Bertis Ruddy to provide support and encouragement.  Will continue to navigate as L1 (new) patient.   Young Berry, RN, BSN, [facility] Navigator [phone]

## 2013-05-19 NOTE — Progress Notes (Signed)
Met with patient during scheduled Port and Treat to provide support and encouragement.  Reinforced procedure for future treatments.  Will continue to navigate as L1 (new) patient.  Young Berry, RN, BSN, [facility] Navigator [phone]

## 2013-05-19 NOTE — Telephone Encounter (Signed)
RECEIVED PHONE CALL FROM ANGIE AT [facility] AND THIS PATIENT WILL HAVE AN APPT. WITH DR. CARL SCHINKE ON 05-26-13- ARRIVAL TIME - 1 PM

## 2013-05-19 NOTE — Addendum Note (Signed)
Encounter addended by: Delynn Flavin, RN on: 05/19/2013  6:51 PM<BR>     Documentation filed: Orders

## 2013-05-19 NOTE — Telephone Encounter (Signed)
CALLED PATIENT TO INFORM OF APPT. TO SEE THE NUTRITIONIST ON 06-06-13 AT 12:00 PM AND APPT. TO SEE DR. CARL SCHINKE ON 05-26-13- ARRIVAL TIME - 1 PM, SPOKE WITH PATIENT AND HE IS AWARE OF THESE APPTS.

## 2013-05-20 ENCOUNTER — Ambulatory Visit
Admission: RE | Admit: 2013-05-20 | Discharge: 2013-05-20 | Disposition: A | Discharge status: Home / Self Care | Payer: No Typology Code available for payment source | Source: Ambulatory Visit | Attending: Radiation Oncology | Admitting: Radiation Oncology

## 2013-05-21 ENCOUNTER — Ambulatory Visit
Admission: RE | Admit: 2013-05-21 | Discharge: 2013-05-21 | Disposition: A | Discharge status: Home / Self Care | Payer: No Typology Code available for payment source | Source: Ambulatory Visit | Attending: Radiation Oncology | Admitting: Radiation Oncology

## 2013-05-21 DIAGNOSIS — C320 Malignant neoplasm of glottis: Secondary | ICD-10-CM

## 2013-05-22 ENCOUNTER — Ambulatory Visit
Admission: RE | Admit: 2013-05-22 | Discharge: 2013-05-22 | Disposition: A | Discharge status: Home / Self Care | Payer: No Typology Code available for payment source | Source: Ambulatory Visit | Attending: Radiation Oncology | Admitting: Radiation Oncology

## 2013-05-22 DIAGNOSIS — C320 Malignant neoplasm of glottis: Secondary | ICD-10-CM

## 2013-05-22 MED ORDER — BIAFINE EX EMUL
CUTANEOUS | Status: DC | PRN
  Administered 2013-05-22: 12:00:00 via TOPICAL

## 2013-05-23 ENCOUNTER — Ambulatory Visit
Admission: RE | Admit: 2013-05-23 | Discharge: 2013-05-23 | Disposition: A | Discharge status: Home / Self Care | Payer: No Typology Code available for payment source | Source: Ambulatory Visit | Attending: Radiation Oncology | Admitting: Radiation Oncology

## 2013-05-26 ENCOUNTER — Encounter: Payer: Self-pay | Admitting: *Deleted

## 2013-05-26 ENCOUNTER — Ambulatory Visit
Admission: RE | Admit: 2013-05-26 | Discharge: 2013-05-26 | Disposition: A | Discharge status: Home / Self Care | Payer: No Typology Code available for payment source | Source: Ambulatory Visit | Attending: Radiation Oncology | Admitting: Radiation Oncology

## 2013-05-26 ENCOUNTER — Ambulatory Visit: Payer: No Typology Code available for payment source

## 2013-05-26 ENCOUNTER — Ambulatory Visit: Payer: No Typology Code available for payment source | Attending: Hematology and Oncology

## 2013-05-26 VITALS — BP 108/73 | HR 75 | Temp 97.7°F | Ht 71.5 in | Wt 167.2 lb

## 2013-05-26 DIAGNOSIS — IMO0001 Reserved for inherently not codable concepts without codable children: Secondary | ICD-10-CM | POA: Insufficient documentation

## 2013-05-26 DIAGNOSIS — C320 Malignant neoplasm of glottis: Secondary | ICD-10-CM

## 2013-05-26 DIAGNOSIS — R1310 Dysphagia, unspecified: Secondary | ICD-10-CM | POA: Insufficient documentation

## 2013-05-26 MED ORDER — OXYCODONE HCL 10 MG PO TABS: 10.0000 mg | ORAL_TABLET | ORAL | Status: DC | PRN

## 2013-05-26 NOTE — Progress Notes (Signed)
   Weekly Management Note:  outpatient Current Dose:  13.5 Gy  Projected Dose: 63 Gy   Narrative:  The patient presents for routine under treatment assessment.  CBCT/MVCT images/Port film x-rays were reviewed.  The chart was checked. Throat more sore. Taking 2 oxycodone tabs at a time. Needs to pick up MMW. Moving bowels well. finished diflucan  Physical Findings:  height is 5' 11.5" (1.816 m) and weight is 167 lb 3.2 oz (75.841 kg). His temperature is 97.7 F (36.5 C). His blood pressure is 108/73 and his pulse is 75.  NAD, nor skin or mucosal changes thus far, no thrush  Impression:  The patient is tolerating radiotherapy.  Plan:  Continue radiotherapy as planned. Refill on Oxycodone given - increased to 10mg  tabs.  ________________________________   Lonie Peak, M.D.

## 2013-05-26 NOTE — Progress Notes (Addendum)
[name] has received 6 fractions to his larynx.  He c/o pain in his throat as a level 7-8 on a scale of 0-10.  His mouth is clear, mucosa is moist and throat without any redness.  Skin intact on neck.  Asking for refill: Oxycodone 5/325mg  tabs.

## 2013-05-26 NOTE — Progress Notes (Signed)
Met with pt during scheduled PUT visit with Dr. Basilio Cairo.  Pt indicated swallowing is becoming increasingly uncomfortable and I encouraged him to use the Magic Mouthwash as prescribed.  Pt stated further that he has reduced his smoking from 2 ppd to > 1ppd without the aid of nicotene patches.  Pt stated he is motivated to quit completely and is working towards that.  He noted that he "doesn't like taking pills; they taste bad, have a hard time swallowing them".  I encouraged him to try taking oral medication with applesauce or pudding to which he said he would give that a try.  Will continue to navigate as L1 (new) patient.  Young Berry, RN, BSN, [facility] Head & Neck Oncology Navigator [phone]

## 2013-05-27 ENCOUNTER — Ambulatory Visit
Admission: RE | Admit: 2013-05-27 | Discharge: 2013-05-27 | Disposition: A | Discharge status: Home / Self Care | Payer: No Typology Code available for payment source | Source: Ambulatory Visit | Attending: Radiation Oncology | Admitting: Radiation Oncology

## 2013-05-28 ENCOUNTER — Ambulatory Visit
Admission: RE | Admit: 2013-05-28 | Discharge: 2013-05-28 | Disposition: A | Discharge status: Home / Self Care | Payer: No Typology Code available for payment source | Source: Ambulatory Visit | Attending: Radiation Oncology | Admitting: Radiation Oncology

## 2013-05-29 ENCOUNTER — Ambulatory Visit
Admission: RE | Admit: 2013-05-29 | Discharge: 2013-05-29 | Disposition: A | Discharge status: Home / Self Care | Payer: No Typology Code available for payment source | Source: Ambulatory Visit | Attending: Radiation Oncology | Admitting: Radiation Oncology

## 2013-05-30 ENCOUNTER — Ambulatory Visit
Admission: RE | Admit: 2013-05-30 | Discharge: 2013-05-30 | Disposition: A | Discharge status: Home / Self Care | Payer: No Typology Code available for payment source | Source: Ambulatory Visit | Attending: Radiation Oncology | Admitting: Radiation Oncology

## 2013-06-02 ENCOUNTER — Encounter: Payer: Self-pay | Admitting: Radiation Oncology

## 2013-06-02 ENCOUNTER — Ambulatory Visit
Admission: RE | Admit: 2013-06-02 | Discharge: 2013-06-02 | Disposition: A | Discharge status: Home / Self Care | Payer: No Typology Code available for payment source | Source: Ambulatory Visit | Attending: Radiation Oncology | Admitting: Radiation Oncology

## 2013-06-02 VITALS — BP 108/67 | HR 74 | Temp 99.1°F | Ht 71.5 in | Wt 163.2 lb

## 2013-06-02 DIAGNOSIS — C320 Malignant neoplasm of glottis: Secondary | ICD-10-CM

## 2013-06-02 NOTE — Progress Notes (Signed)
Weekly Management Note:  Site: Glottic larynx Current Dose:  2475  cGy Projected Dose: 6300  cGy  Narrative: The patient is seen today for routine under treatment assessment. CBCT/MVCT images/port films were reviewed. The chart was reviewed.   He is having worsening throat discomfort. He takes oxycodone when necessary  prior to eating.  Physical Examination:  Filed Vitals:   06/02/13 1217  BP: 108/67  Pulse: 74  Temp: 99.1 F (37.3 C)  .  Weight: 163 lb 3.2 oz (74.027 kg). There is slight erythema along the anterior neck. Oral cavity and oropharynx are unremarkable to inspection. Indirect mirror examination not performed today.  Impression: Tolerating radiation therapy well.  Plan: Continue radiation therapy as planned.

## 2013-06-02 NOTE — Progress Notes (Signed)
[name] has received 11 fractions to his larynx.  He is c/o of increased sore thraot as a level 7-8 on a scale of 0-10 when he swallows.  He takes Oxycodone at least 1 hour B 4 meals and is staying away from hard textured foods.  Note redness of his neck and reports tenderness of skin in the tx field.

## 2013-06-03 ENCOUNTER — Ambulatory Visit
Admission: RE | Admit: 2013-06-03 | Discharge: 2013-06-03 | Disposition: A | Discharge status: Home / Self Care | Payer: No Typology Code available for payment source | Source: Ambulatory Visit | Attending: Radiation Oncology | Admitting: Radiation Oncology

## 2013-06-04 ENCOUNTER — Ambulatory Visit
Admission: RE | Admit: 2013-06-04 | Discharge: 2013-06-04 | Disposition: A | Discharge status: Home / Self Care | Payer: No Typology Code available for payment source | Source: Ambulatory Visit | Attending: Radiation Oncology | Admitting: Radiation Oncology

## 2013-06-04 ENCOUNTER — Encounter: Payer: Self-pay | Admitting: *Deleted

## 2013-06-05 ENCOUNTER — Encounter: Payer: Self-pay | Admitting: Radiation Oncology

## 2013-06-05 ENCOUNTER — Other Ambulatory Visit: Payer: Self-pay | Admitting: Radiation Oncology

## 2013-06-05 ENCOUNTER — Ambulatory Visit
Admission: RE | Admit: 2013-06-05 | Discharge: 2013-06-05 | Disposition: A | Discharge status: Home / Self Care | Payer: No Typology Code available for payment source | Source: Ambulatory Visit | Attending: Radiation Oncology | Admitting: Radiation Oncology

## 2013-06-05 VITALS — BP 120/74 | HR 73 | Temp 97.7°F | Ht 71.5 in | Wt 167.9 lb

## 2013-06-05 DIAGNOSIS — C320 Malignant neoplasm of glottis: Secondary | ICD-10-CM

## 2013-06-05 MED ORDER — OXYCODONE HCL 10 MG PO TABS: 10.0000 mg | ORAL_TABLET | Freq: Four times a day (QID) | ORAL | Status: DC | PRN

## 2013-06-05 MED ORDER — FENTANYL 75 MCG/HR TD PT72: 75.0000 ug | MEDICATED_PATCH | TRANSDERMAL | Status: DC

## 2013-06-05 NOTE — Progress Notes (Signed)
Weekly Management Note:  Site: Larynx Current Dose:  3150  cGy Projected Dose: 6300  cGy  Narrative: The patient is seen today for routine under treatment assessment. CBCT/MVCT images/port films were reviewed. The chart was reviewed.   [name] requesting refill of his OxyContin 10 mg tabs. He states that he has  between 5-10 tablets left. He was given 90 tablets on November 3, 10 days ago. He tells me that he takes 2 tabs in the morning, one at lunchtime and tumor in the evening for a total of 5 a day. 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 By my calculation, he should of taken 50 tablets and have 40 tablets left. He and his wife cannot account for what amounts to be 30 tablets. Of note is that he started oxycodone after just 6 fractions of radiation therapy which would be quite unusual. He has no history of prior chronic narcotic use, although he is allergic to morphine.  Physical Examination:  Filed Vitals:   06/05/13 1157  BP: 120/74  Pulse: 73  Temp: 97.7 F (36.5 C)  .  Weight: 167 lb 14.4 oz (76.159 kg). No change.  Impression: Tolerating radiation therapy well, , but his narcotic usage appears to be excessive. Furthermore, it appears to be missing tablets raising my suspicion that there is either abuse or someone else taking his narcotics. I suggested to him that he keep track of his pain medications. I want to start him on a fentanyl patch at 75 mcg, and also refill his oxycodone 10 mg tabs dispense #90 to take 1 by mouth every 4 hours when necessary. He tells that he may not be able to afford  fentanyl patches and we may may simply have to rely on oxycodone.  Plan: Continue radiation therapy as planned.

## 2013-06-05 NOTE — Progress Notes (Signed)
Met with patient during scheduled RT.  Pt indicated he was taking his Oxycodone 10 mg regularly throughout the day for throat pain and that he would need a refill before the weekend.  He stated that he was taking "a couple" each time to control the pain.  I indicated he could see Dr. Dayton Scrape tomorrow to address his pain mgt and a refill.  He indicated agreement.  Navigating as L2 (treatments established) patient.  Young Berry, RN, BSN, [facility] Head & Neck Oncology Navigator [phone]

## 2013-06-06 ENCOUNTER — Encounter: Payer: Self-pay | Admitting: Nutrition

## 2013-06-06 ENCOUNTER — Ambulatory Visit
Admission: RE | Admit: 2013-06-06 | Discharge: 2013-06-06 | Disposition: A | Discharge status: Home / Self Care | Payer: No Typology Code available for payment source | Source: Ambulatory Visit | Attending: Radiation Oncology | Admitting: Radiation Oncology

## 2013-06-06 NOTE — Progress Notes (Signed)
Patient's wife called and cancelled nutrition appointment.  She did not wish to reschedule today.  Stated she would call back to reschedule.

## 2013-06-09 ENCOUNTER — Encounter: Payer: Self-pay | Admitting: Radiation Oncology

## 2013-06-09 ENCOUNTER — Ambulatory Visit
Admission: RE | Admit: 2013-06-09 | Discharge: 2013-06-09 | Disposition: A | Discharge status: Home / Self Care | Payer: No Typology Code available for payment source | Source: Ambulatory Visit | Attending: Radiation Oncology | Admitting: Radiation Oncology

## 2013-06-09 VITALS — BP 103/67 | HR 77 | Temp 97.6°F | Ht 71.5 in | Wt 167.5 lb

## 2013-06-09 DIAGNOSIS — C320 Malignant neoplasm of glottis: Secondary | ICD-10-CM

## 2013-06-09 NOTE — Progress Notes (Signed)
[name] has received fractions to his neck.  He is very hoarse today, but attributes this to 3-4 episdoes of N&V and dry heaves after placing  His first Fentanyl patch on this past Saturday morning and he is no longer applying them.  His throat/mouth are without any irritation and his oral mucosa is moist. The skin on his throat is reddened,but intact. He continues to eat soft textured foods and takes magic mouthwash prior to each meal and at bedtime.  He states that he feels as if he is having acid reflux within the past "couple of days".

## 2013-06-09 NOTE — Progress Notes (Signed)
Weekly Management Note:  Site: Larynx Current Dose:  3600  cGy Projected Dose: 6300  cGy  Narrative: The patient is seen today for routine under treatment assessment. CBCT/MVCT images/port films were reviewed. The chart was reviewed.   30 minutes after placing a fentanyl 75 mcg patch he became nauseated. He removed the patch. He tells me he takes up to 5 oxycodone tablets a day. His throat remains sore. His weight remained stable.  Physical Examination:  Filed Vitals:   06/09/13 1156  BP: 103/67  Pulse: 77  Temp: 97.6 F (36.4 C)  .  Weight: 167 lb 8 oz (75.978 kg). There is marked erythema along the anterior neck. Oral cavity and oropharynx are unremarkable to inspection. Indirect mirror examination not performed.  Impression: Tolerating radiation therapy well. He'll keep a close record of his narcotic use. We'll continue with oxycodone when necessary and hold off on a long-acting medication such as OxyContin. His resources are limited.  Plan: Continue radiation therapy as planned.

## 2013-06-10 ENCOUNTER — Ambulatory Visit
Admission: RE | Admit: 2013-06-10 | Discharge: 2013-06-10 | Disposition: A | Discharge status: Home / Self Care | Payer: No Typology Code available for payment source | Source: Ambulatory Visit | Attending: Radiation Oncology | Admitting: Radiation Oncology

## 2013-06-11 ENCOUNTER — Ambulatory Visit
Admission: RE | Admit: 2013-06-11 | Discharge: 2013-06-11 | Disposition: A | Discharge status: Home / Self Care | Payer: No Typology Code available for payment source | Source: Ambulatory Visit | Attending: Radiation Oncology | Admitting: Radiation Oncology

## 2013-06-12 ENCOUNTER — Ambulatory Visit
Admission: RE | Admit: 2013-06-12 | Discharge: 2013-06-12 | Disposition: A | Discharge status: Home / Self Care | Payer: No Typology Code available for payment source | Source: Ambulatory Visit | Attending: Radiation Oncology | Admitting: Radiation Oncology

## 2013-06-13 ENCOUNTER — Ambulatory Visit
Admission: RE | Admit: 2013-06-13 | Discharge: 2013-06-13 | Disposition: A | Discharge status: Home / Self Care | Payer: No Typology Code available for payment source | Source: Ambulatory Visit | Attending: Radiation Oncology | Admitting: Radiation Oncology

## 2013-06-15 ENCOUNTER — Ambulatory Visit
Admission: RE | Admit: 2013-06-15 | Discharge: 2013-06-15 | Disposition: A | Discharge status: Home / Self Care | Payer: No Typology Code available for payment source | Source: Ambulatory Visit | Attending: Radiation Oncology | Admitting: Radiation Oncology

## 2013-06-16 ENCOUNTER — Encounter: Payer: Self-pay | Admitting: Radiation Oncology

## 2013-06-16 ENCOUNTER — Ambulatory Visit
Admission: RE | Admit: 2013-06-16 | Discharge: 2013-06-16 | Disposition: A | Discharge status: Home / Self Care | Payer: No Typology Code available for payment source | Source: Ambulatory Visit | Attending: Radiation Oncology | Admitting: Radiation Oncology

## 2013-06-16 VITALS — BP 111/73 | HR 76 | Temp 97.7°F | Ht 71.5 in | Wt 162.4 lb

## 2013-06-16 DIAGNOSIS — C320 Malignant neoplasm of glottis: Secondary | ICD-10-CM

## 2013-06-16 MED ORDER — BIAFINE EX EMUL
CUTANEOUS | Status: DC | PRN
  Administered 2013-06-16: 13:00:00 via TOPICAL

## 2013-06-16 NOTE — Progress Notes (Signed)
Weekly Management Note:  Site: Glottic larynx Current Dose:  4950  cGy Projected Dose: 6300  cGy  Narrative: The patient is seen today for routine under treatment assessment. CBCT/MVCT images/port films were reviewed. The chart was reviewed.   He continues with his oxycodone, 2 pills in the a.m., 2 pills midday, and 2 pills in the evening for his throat pain with swallowing . He is more hoarse.  Physical Examination:  Filed Vitals:   06/16/13 1159  BP: 111/73  Pulse: 76  Temp: 97.7 F (36.5 C)  .  Weight: 162 lb 6.4 oz (73.664 kg). There is marked erythema/hyperpigmentation the skin along the anterior neck with dry desquamation. On indirect mirror examination there is a confluent mucositis along his endolarynx. Both true vocal cords move well.  Impression: Tolerating radiation therapy well. I will go ahead and refill his oxycodone this Wednesday prior to the long weekend. He currently has 30 pills, none for the 5 days. He would need another refill by this Friday.  Plan: Continue radiation therapy as planned.

## 2013-06-16 NOTE — Addendum Note (Signed)
Encounter addended by: Delynn Flavin, RN on: 06/16/2013  1:00 PM<BR>     Documentation filed: Inpatient MAR, Orders

## 2013-06-16 NOTE — Progress Notes (Signed)
[name] has received 22 fractions to his larynx.  He c/o increasing pain in his throat as a level 8 on a scale on a scale of 0-10 when he swallows.  He is taking 2 Oxycodone in the am, 2 mid day and 2 in the evening because of his increasing pain. He is also c/o tenderness of his anterior neck and reports that he had blisters on his neck over the weekend,but they have resolved.  Note erythema with hyperpigmentation today with intact skin.

## 2013-06-17 ENCOUNTER — Ambulatory Visit
Admission: RE | Admit: 2013-06-17 | Discharge: 2013-06-17 | Disposition: A | Discharge status: Home / Self Care | Payer: No Typology Code available for payment source | Source: Ambulatory Visit | Attending: Radiation Oncology | Admitting: Radiation Oncology

## 2013-06-18 ENCOUNTER — Encounter: Payer: Self-pay | Admitting: *Deleted

## 2013-06-18 ENCOUNTER — Ambulatory Visit
Admission: RE | Admit: 2013-06-18 | Discharge: 2013-06-18 | Disposition: A | Discharge status: Home / Self Care | Payer: No Typology Code available for payment source | Source: Ambulatory Visit | Attending: Radiation Oncology | Admitting: Radiation Oncology

## 2013-06-18 NOTE — Progress Notes (Signed)
Met with patient prior to and after scheduled RT.  Voice is notably more hoarse since our previous encounter.  He indicated he is eating and drinking but has ongoing needs for neck pain relief wh/ Dr. Dayton Scrape has been addressing.  Continuing to navigate as L2 (treatments established) patient.  Young Berry, RN, BSN, [facility] Head & Neck Oncology Navigator [phone]

## 2013-06-23 ENCOUNTER — Ambulatory Visit
Admission: RE | Admit: 2013-06-23 | Discharge: 2013-06-23 | Disposition: A | Discharge status: Home / Self Care | Payer: No Typology Code available for payment source | Source: Ambulatory Visit | Attending: Radiation Oncology | Admitting: Radiation Oncology

## 2013-06-23 ENCOUNTER — Encounter: Payer: Self-pay | Admitting: Radiation Oncology

## 2013-06-23 ENCOUNTER — Ambulatory Visit: Payer: Self-pay

## 2013-06-23 ENCOUNTER — Ambulatory Visit: Payer: No Typology Code available for payment source

## 2013-06-23 VITALS — BP 106/74 | HR 84 | Resp 18 | Wt 155.8 lb

## 2013-06-23 DIAGNOSIS — C320 Malignant neoplasm of glottis: Secondary | ICD-10-CM

## 2013-06-23 NOTE — Progress Notes (Signed)
Hoarseness continues. Reports pain and difficulty swallowing x 3 days. Reports he is unable to lay flat due to intense reflux. Hyperpigmentation of anterior neck noted without breaks in skin. Reports using biafine cream often during the day. Reports dry mouth and thick ropey saliva continues. Reports he is unable to use magic mouthwash any longer because it makes him nauseated. Denies using biotene rinse. No thrush noted. Reports he mostly eats soft foods and ice cream. Reports he felt fevered Friday and Saturday night. Patient has lost 7 lb since 06/16/2013.

## 2013-06-23 NOTE — Progress Notes (Signed)
Weekly Management Note:  Site: Larynx Current Dose:  5625  cGy Projected Dose: 6300  cGy  Narrative: The patient is seen today for routine under treatment assessment. CBCT/MVCT images/port films were reviewed. The chart was reviewed.   He is worsening hoarseness. He also has significant pain on swallowing for which he continues with his oxycodone, 6 pills a day. He also reports reflux for which he takes Prilosec. He is lost 7 pounds over the past week.  Physical Examination:  Filed Vitals:   06/23/13 1201  BP: 106/74  Pulse: 84  Resp: 18  .  Weight: 155 lb 12.8 oz (70.67 kg). There is marked hyperpigmentation the skin along the anterior neck with dry desquamation. Oral cavity and oropharynx are unremarkable to inspection. Indirect mirror examination not performed today.  Impression: He continues to have a difficult time with his radiation therapy. He has 3 more treatments. I encouraged him to increase his by mouth fluid intake to avoid dehydration.  Plan: Continue radiation therapy as planned. I'll see him again on his last day, this Thursday.

## 2013-06-24 ENCOUNTER — Ambulatory Visit: Payer: No Typology Code available for payment source

## 2013-06-25 ENCOUNTER — Ambulatory Visit
Admission: RE | Admit: 2013-06-25 | Discharge: 2013-06-25 | Disposition: A | Discharge status: Home / Self Care | Payer: No Typology Code available for payment source | Source: Ambulatory Visit | Attending: Radiation Oncology | Admitting: Radiation Oncology

## 2013-06-26 ENCOUNTER — Ambulatory Visit
Admission: RE | Admit: 2013-06-26 | Discharge: 2013-06-26 | Disposition: A | Discharge status: Home / Self Care | Payer: No Typology Code available for payment source | Source: Ambulatory Visit | Attending: Radiation Oncology | Admitting: Radiation Oncology

## 2013-06-26 ENCOUNTER — Ambulatory Visit: Payer: No Typology Code available for payment source

## 2013-06-27 ENCOUNTER — Ambulatory Visit
Admission: RE | Admit: 2013-06-27 | Discharge: 2013-06-27 | Disposition: A | Discharge status: Home / Self Care | Payer: No Typology Code available for payment source | Source: Ambulatory Visit | Attending: Radiation Oncology | Admitting: Radiation Oncology

## 2013-06-27 ENCOUNTER — Encounter: Payer: Self-pay | Admitting: *Deleted

## 2013-06-27 ENCOUNTER — Encounter: Payer: Self-pay | Admitting: Radiation Oncology

## 2013-06-27 ENCOUNTER — Ambulatory Visit: Payer: No Typology Code available for payment source

## 2013-06-27 VITALS — BP 106/64 | HR 75 | Temp 98.7°F | Ht 71.5 in | Wt 156.3 lb

## 2013-06-27 DIAGNOSIS — C320 Malignant neoplasm of glottis: Secondary | ICD-10-CM

## 2013-06-27 MED ORDER — OXYCODONE HCL 5 MG PO TABS: 5.0000 mg | ORAL_TABLET | ORAL | Status: DC | PRN

## 2013-06-27 NOTE — Progress Notes (Signed)
Weekly Management Note Current Dose:63 Gy  Projected Dose:63 Gy   Narrative:  The patient presents for routine under treatment assessment.  CBCT/MVCT images/Port film x-rays were reviewed.  The chart was checked. Doing well. Would like to wean off pain meds. Needs refill. Skin has peeled.   Physical Findings: Dry desquamation and new pink skin over anterior neck. Hoarse.  Vitals:  Filed Vitals:   06/27/13 1151  BP: 106/64  Pulse: 75  Temp: 98.7 F (37.1 C)   Weight:  Wt Readings from Last 3 Encounters:  06/27/13 156 lb 4.8 oz (70.897 kg)  06/23/13 155 lb 12.8 oz (70.67 kg)  06/16/13 162 lb 6.4 oz (73.664 kg)   Lab Results  Component Value Date   WBC 6.3 04/22/2013   HGB 13.9 04/22/2013   HCT 39.5 04/22/2013   MCV 95.0 04/22/2013   PLT 195 04/22/2013   Lab Results  Component Value Date   CREATININE 1.27 03/17/2013   BUN 16 03/17/2013   NA 138 03/17/2013   K 4.0 03/17/2013   CL 103 03/17/2013   CO2 27 03/17/2013     Impression:  Finishes RT today.   Plan: Gave script for oxycodone 5 mg. Follow up in 1 month. Schedule f/u with ENT for February. Call with questions. Biafene x 2 weeks then lotion with vitamin e.

## 2013-06-27 NOTE — Progress Notes (Signed)
[name] has completed radiation to his neck.  He c/o dysphagia and odonyphagia at level 7-8 on a scale of 0-10.  Eating softer diet and notes taste changes.  Weight stable since 06/23/13.  His normal weight is ~ 170.  He c/o fatigue because he wakes up frequently. His anterior neck with erythema and dry desquamation and his oral mucosa is moist without any signs of irritation.

## 2013-06-27 NOTE — Progress Notes (Addendum)
Met with patient and his wife for patient's final RT to provide support.  Told patient that my navigation will continue and encouraged him to contact me at any time with questions/concerns.  He indicated understanding.  Reminded him of 12/8 2:45 appt Speech; reinforced importance of keeping these appts, and to continue with throat exercises.  He indicated understanding. Initiating L3 (treatments completed) navigation.  Young Berry, RN, BSN, [facility] Head & Neck Oncology Navigator [phone]

## 2013-06-30 ENCOUNTER — Ambulatory Visit: Payer: Self-pay

## 2013-06-30 ENCOUNTER — Ambulatory Visit: Payer: No Typology Code available for payment source | Attending: Hematology and Oncology

## 2013-06-30 DIAGNOSIS — IMO0001 Reserved for inherently not codable concepts without codable children: Secondary | ICD-10-CM | POA: Insufficient documentation

## 2013-06-30 DIAGNOSIS — R1310 Dysphagia, unspecified: Secondary | ICD-10-CM | POA: Insufficient documentation

## 2013-07-02 ENCOUNTER — Telehealth: Payer: Self-pay | Admitting: *Deleted

## 2013-07-02 ENCOUNTER — Other Ambulatory Visit: Payer: Self-pay | Admitting: Radiation Oncology

## 2013-07-02 ENCOUNTER — Encounter: Payer: Self-pay | Admitting: Radiation Oncology

## 2013-07-02 MED ORDER — OXYCODONE HCL 10 MG PO TABS: 10.0000 mg | ORAL_TABLET | ORAL | Status: DC | PRN

## 2013-07-02 NOTE — Progress Notes (Signed)
[facility] Radiation Oncology End of Treatment Note  Name:[name]  Date: 07/02/2013 YQM:[id number] DOB:07/20/1966   Status:outpatient    CC: Jeanann Lewandowsky, MD  Dr. Artis Delay, Dr. Osborn Coho  REFERRING PHYSICIAN:  Dr. 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 tolerated his treatment reasonably well, however he required oxycodone early on during his course of therapy. He took up to 6 oxycodone tablets a day. He developed marked hyperpigmentation along with dry desquamation the skin along his anterior neck.                           PLAN: Routine followup in one month. Patient instructed to call if questions or worsening complaints in interim.

## 2013-07-03 NOTE — Telephone Encounter (Signed)
Patient's wife called stating that the 5 mg Oxy IR the patient is currently taking is not providing adequate pain control.  Patient had requested lower dosage last week when he met with Dr. Michell Heinrich.  He is requesting an Rx for the 10 mg dosage; I relayed request to Dr. Michell Heinrich via [place]. I later called patient to let him know that the Rx was available for pick-up at the RadOnc nurses station.  He indicated understanding.  Young Berry, RN, BSN, [facility] Head & Neck Oncology Navigator [phone]

## 2013-07-07 ENCOUNTER — Ambulatory Visit: Payer: Self-pay

## 2013-07-07 ENCOUNTER — Ambulatory Visit: Payer: No Typology Code available for payment source

## 2013-07-11 ENCOUNTER — Telehealth: Payer: Self-pay | Admitting: *Deleted

## 2013-07-11 NOTE — Telephone Encounter (Signed)
Called pt in follow-up to missed appt with Speech.  Pt unable to talk b/c of extreme hoarseness.  Spoke with patient wife, re-educated about importance of Speech follow-up appts to maintain proper execution of  swallowing/neck exercises s/p RT.  Wife indicated understanding and stated she wd relay message to patient.  I further stated I would contact Speech for rescheduling of appt.  She expressed understanding.  Will continue to navigate as L3 (treatments completed) patient.  Young Berry, RN, BSN, [facility] Head & Neck Oncology Navigator [phone]

## 2013-07-11 NOTE — Telephone Encounter (Signed)
Patient called to indicate needs Oxycodone refill early next week.  Reported that he is taking 2 10 mg tab before each meal.  He requested script for 5 mg tab and I reminded him that this dosage did not give him adequate pain control.   He agreed that continuing with 10 mg might be more prudent.  Informed Dr. Dayton Scrape of request via Note routing.  Spoke to patient re: importance of follow-up speech appt.  He agreed to keep appt when rescheduled.  Young Berry, RN, BSN, [facility] Head & Neck Oncology Navigator [phone]

## 2013-07-14 ENCOUNTER — Ambulatory Visit
Admission: RE | Admit: 2013-07-14 | Discharge: 2013-07-14 | Disposition: A | Discharge status: Home / Self Care | Payer: No Typology Code available for payment source | Source: Ambulatory Visit | Attending: Radiation Oncology | Admitting: Radiation Oncology

## 2013-07-14 ENCOUNTER — Encounter: Payer: Self-pay | Admitting: *Deleted

## 2013-07-14 ENCOUNTER — Telehealth: Payer: Self-pay | Admitting: *Deleted

## 2013-07-14 VITALS — BP 112/71 | HR 76 | Temp 98.2°F | Wt 160.7 lb

## 2013-07-14 DIAGNOSIS — C320 Malignant neoplasm of glottis: Secondary | ICD-10-CM

## 2013-07-14 DIAGNOSIS — Z923 Personal history of irradiation: Secondary | ICD-10-CM | POA: Insufficient documentation

## 2013-07-14 MED ORDER — OXYCODONE-ACETAMINOPHEN 10-325 MG PO TABS: 1.0000 | ORAL_TABLET | ORAL | Status: DC | PRN

## 2013-07-14 NOTE — Telephone Encounter (Signed)
Facilitated 3:00 appt today with Dr. Dayton Scrape to discuss medication refills.  Called patient to inform; he verbalized understanding.  Leslie Berry, RN, BSN, [facility] Head & Neck Oncology Navigator [phone]

## 2013-07-14 NOTE — Progress Notes (Addendum)
error 

## 2013-07-14 NOTE — Progress Notes (Signed)
Patient here for assessment and refill on pain medications.Throat pain has improved down to "6" states he keeps a sore throat.Skin is red without peeling.

## 2013-07-14 NOTE — Progress Notes (Signed)
Followup note:  Mr. Chestnutt returns today needing a refill this medication. He continues to have significant throat discomfort with pain level of 7/10 despite taking up to 6 tablets a 10 mg oxycodone/APAP tabs a day. He tells me his voice is beginning to return. He does have mild dysphagia. Dr. Michell Heinrich try to taper him to 5 mg tabs every 6 hours but he was too symptomatic. His only 4  10 mg oxycodone/APAP tabs left.  Physical examination: Alert and oriented. Oral cavity and oropharynx unremarkable to inspection. On indirect mirror examination there is a confluent mucositis along the endolarynx.  Impression: Resolving radiation mucositis. He still taking quite a bit of narcotics for his degree of pain. I wrote a prescription refill of oxycodone 10 mg/APAP tabs dispense #100 to take 1 by mouth every 4 hours when necessary. He is to begin a taper of one half tab every 4 hours when necessary in the next 2 weeks. He has a pill cutter in home.  Name: He is maintain his followup for January 15. He may take ibuprofen when necessary as well.

## 2013-07-23 ENCOUNTER — Telehealth: Payer: Self-pay | Admitting: *Deleted

## 2013-07-23 NOTE — Telephone Encounter (Signed)
VM received from Disability office in [place].  Their number is [phone],  Ext. [phone].  Gentleman left message requested call back with information about type of treatment pt is getting or is planned so he can complete pt's disability claim.

## 2013-07-25 ENCOUNTER — Ambulatory Visit
Admission: RE | Admit: 2013-07-25 | Discharge: 2013-07-25 | Disposition: A | Discharge status: Home / Self Care | Payer: No Typology Code available for payment source | Source: Ambulatory Visit | Attending: Radiation Oncology | Admitting: Radiation Oncology

## 2013-07-25 ENCOUNTER — Telehealth: Payer: Self-pay | Admitting: *Deleted

## 2013-07-25 VITALS — BP 131/84 | HR 73 | Temp 97.9°F | Resp 18 | Wt 158.3 lb

## 2013-07-25 DIAGNOSIS — C320 Malignant neoplasm of glottis: Secondary | ICD-10-CM

## 2013-07-25 MED ORDER — OXYCODONE-ACETAMINOPHEN 10-325 MG PO TABS: 1.0000 | ORAL_TABLET | ORAL | Status: DC | PRN

## 2013-07-25 NOTE — Progress Notes (Signed)
Patient here for follow up to assess pain management and refill of percocet.Has been taking 2 pills every 4 hours instead of 1.continued sore throat and " feeling of something stuck in throat."Pain level "6 to 8".

## 2013-07-25 NOTE — Telephone Encounter (Signed)
Patient called with request for Percocet Rx, indicating 7 remaining.  Stated he has been taking 2 Tabs q4 since receiving original 100 Tab Rx on 07/14/13; I noted the directions state 1 Tab q4 and pt replied that 1 Tab doesn't control pain.  Patient stated he does not have any Oxycodone 10 mg, "have been out since I got the Percocet".  Arranged for patient to see Dr. Lisbeth Renshaw @ 2:00 this afternoon; notified patient of appointment, he verbalized understanding.  Gayleen Orem, RN, BSN, [facility] Head & Neck Oncology Navigator [phone]

## 2013-07-25 NOTE — Progress Notes (Signed)
Follow-up note: the patient was seen today requesting a refill of his pain medicine. He is approximately 3 weeks out from completing radiation treatment for head and neck cancer. He states that he still has severe pain in the throat although this has been improving. He has been using Percocet 10/325. He states that he has been requiring 2 tablets at a time versus 1 which was the prescription.  On exam, no significant mucositis present within the oral cavity.  We discussed that his pain should improve significantly over the next couple of weeks. I have given him a refill, changing the prescription to 1-2 tabs every 4 hours when necessary. We discussed that he could not take more than 10 tablets per day given the Tylenol content. He expressed an understanding of this issue. He is scheduled to see Dr. Pablo Ledger in a couple of weeks. He is to let us know if he has problems or issues sooner than this.

## 2013-07-30 ENCOUNTER — Other Ambulatory Visit: Payer: Self-pay | Admitting: Internal Medicine

## 2013-07-30 NOTE — Telephone Encounter (Signed)
First call: Unable to leave a voicemail on mobile number. Second call: Left message with wife for pt to give [place] a call back.  Pt has not been seen in our clinic since September 2014. Pt will been to schedule an appointment in our clinic before refilling any medication.

## 2013-08-07 ENCOUNTER — Encounter: Payer: Self-pay | Admitting: *Deleted

## 2013-08-07 ENCOUNTER — Ambulatory Visit
Admission: RE | Admit: 2013-08-07 | Discharge: 2013-08-07 | Disposition: A | Discharge status: Home / Self Care | Payer: No Typology Code available for payment source | Source: Ambulatory Visit | Attending: Radiation Oncology | Admitting: Radiation Oncology

## 2013-08-07 ENCOUNTER — Encounter: Payer: Self-pay | Admitting: Radiation Oncology

## 2013-08-07 VITALS — BP 133/82 | HR 74 | Temp 99.7°F | Resp 20 | Ht 71.0 in | Wt 155.1 lb

## 2013-08-07 DIAGNOSIS — C320 Malignant neoplasm of glottis: Secondary | ICD-10-CM

## 2013-08-07 MED ORDER — OXYCODONE-ACETAMINOPHEN 10-325 MG PO TABS: 1.0000 | ORAL_TABLET | ORAL | Status: DC | PRN

## 2013-08-07 NOTE — Progress Notes (Signed)
Follow up rad txs glottic/larynx 05/19/13-06/27/13, eating soft foods, sausage ,pancakes, hashbrowns for breakfast,  b, roast beef mashed pot gravy,brocholi casserole, veg casserole today has thickened saliva, feels like food still gets stuck in thorat, still smokes <1ppd ciigarettes, last percocet tken this morning, pain in throat a 6 1-10 scale, energy level good stated 3:02 PM

## 2013-08-07 NOTE — Progress Notes (Signed)
Met with patient and his wife during routine follow-up appt with Dr. Pablo Ledger to provide support and maintain care continuity.  Patient stated he received call from [facility] office for scheduling of follow-up speech.  I encouraged him to return call and emphasized importance of this follow-up to ensure that he is conducting swallowing exercises properly.  He stated he would schedule appt.  Will continue to navigate as L3 (treatments completed) patient.  Gayleen Orem, RN, BSN, [facility] Head & Neck Oncology Navigator [phone]

## 2013-08-07 NOTE — Progress Notes (Signed)
   Department of Radiation Oncology  Phone:  [phone] Fax:        [phone]   Name: SHAMEL GERMOND MRN: [id number]  DOB: May 19, 1966  Date: 08/07/2013  Follow Up Visit Note  Diagnosis: T1 N0 larynx cancer  Summary and Interval since last radiation: One month from 63 gray in 23 fractions completed 06/27/2013  Interval History: Marco presents today for routine followup.  He is feeling well and doing well. He is improved. He still is having to take 2 Percocet tablets 3 times a day. He is tried coming off of these completely and feels terrible for the next 2 days. If he takes 2 tablets 3 times a day he is able to swallow and 8. He is eating more including hash browns and rose B. He still has thickened saliva and decreased taste. Food still gets stuck in his throat and he feels like he is constantly care clearing his throat. He also feels like he has an exaggerated gag reflex. He is interested in smoking cessation. He still smokes less than a pack of cigarettes however and has cut back. His energy levels are improving. He is back to work.  Allergies:  Allergies  Allergen Reactions  . Fentanyl Nausea And Vomiting  . Morphine And Related Nausea And Vomiting    Medications:  Current Outpatient Prescriptions  Medication Sig Dispense Refill  . levothyroxine (SYNTHROID, LEVOTHROID) 50 MCG tablet Take 50 mcg by mouth daily.      Marland Kitchen oxyCODONE-acetaminophen (PERCOCET) 10-325 MG per tablet Take 1-2 tablets by mouth every 4 (four) hours as needed for pain. Do not take more than 10 tablets per/ day.  100 tablet  0   No current facility-administered medications for this encounter.    Physical Exam:  Filed Vitals:   08/07/13 1458  BP: 133/82  Pulse: 74  Temp: 99.7 F (37.6 C)  TempSrc: Oral  Resp: 20  Height: 5\' 11"  (1.803 m)  Weight: 155 lb 1.6 oz (70.353 kg)  SpO2: 99%   pleasant male in no distress sitting comfortably on examining room table. He has no oral thrush. His mucous  membranes are moist. He is alert and oriented x3. His skin is well-healed in the neck region.  IMPRESSION: Hersh is a 48 y.o. 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 I suggest that he get out of one pill in the middle of the day and then one pill in the morning and then 1 pill at night to just taking 3 pills a day and then try to eliminate one pill again every couple of days. I've given him a refill on his Percocet. I brought him back to see Dr. Isidore Moos for laryngoscopy in 2 weeks. He still feels too sore to perform that today. We will also set him up for Dr. Wilburn Cornelia in April. I discussed with him followup plans including being seen about every other month by either ENT or Dr. Isidore Moos. He will contact us with any questions or concerns. We did discuss smoking cessation. We talked about the use of electronic cigarettes, patches and the smoking cessation classes here at the [facility]. He is interested in quitting just is not sure if this is the right time.    Thea Silversmith, MD

## 2013-08-20 ENCOUNTER — Other Ambulatory Visit: Payer: Self-pay | Admitting: Radiation Oncology

## 2013-08-20 ENCOUNTER — Encounter: Payer: Self-pay | Admitting: *Deleted

## 2013-08-20 DIAGNOSIS — C320 Malignant neoplasm of glottis: Secondary | ICD-10-CM

## 2013-08-20 MED ORDER — OXYCODONE HCL 5 MG PO TABS: 5.0000 mg | ORAL_TABLET | ORAL | Status: DC | PRN

## 2013-08-20 NOTE — Progress Notes (Signed)
Rec'd call from patient's wife (patient at at work). [name] has 34 Percocet remaining (taking 5-6/day) and is concerned about running out before Monday (he has appt with Dr. Isidore Moos on Friday). He prefers going back to the [facility]xy[facility] d/t $; Percocet costs $51, Oxy $6 at [facility].  Forwarded request to Dr. Pablo Ledger.  Spoke with Ailene Ravel (RadOnc Patient Estate manager/land agent) re: getting him financial assistance for meds.  Called patient's home, relayed guidance for patient to bring income verification to evaluate if he qualifies.  She indicated understanding.  Leslie Orem, RN, BSN, [facility] Head & Neck Oncology Navigator [phone]

## 2013-08-27 ENCOUNTER — Telehealth: Payer: Self-pay | Admitting: *Deleted

## 2013-08-27 NOTE — Progress Notes (Signed)
Radiation Oncology         [phone] ________________________________  Name: [name] MRN: [id number]  Date: 08/29/2013  DOB: [birth year]  Follow-Up Visit Note  CC: [name], MD  [name], MD  Diagnosis and Prior Radiotherapy:   T1 N0 larynx cancer  63 gray in 28 fractions completed 06/27/2013   Narrative:  The patient returns today for routine follow-up.   He continues to have significant odynophagia.Marland Kitchen He is taking oxycodone for this, still. He reports thickened saliva in his throat.     He feels his voice is improved since radiotherapy.               ALLERGIES:  is allergic to fentanyl and morphine and related.  Meds: Current Outpatient Prescriptions  Medication Sig Dispense Refill  . oxyCODONE (OXY IR/ROXICODONE) 5 MG immediate release tablet Take 1 tablet (5 mg total) by mouth every 4 (four) hours as needed.  90 tablet  0  . fluconazole (DIFLUCAN) 100 MG tablet Take 2 tablets today, then 1 tablet daily for 20 more days.  22 tablet  0  . levothyroxine (SYNTHROID, LEVOTHROID) 50 MCG tablet Take 50 mcg by mouth daily.      Marland Kitchen oxyCODONE (OXY IR/ROXICODONE) 5 MG immediate release tablet Take 1 tab no more than 3 times a day  45 tablet  0   No current facility-administered medications for this encounter.    Physical Findings: The patient is in no acute distress. Patient is alert and oriented.  height is 5\' 11"  (1.803 m) and weight is 159 lb 9.6 oz (72.394 kg). His temperature is 98.3 F (36.8 C). His blood pressure is 112/66 and his pulse is 100. . Oral cavity is unremarkable. Neck demonstrates no lymphadenopathy. Skin is intact over neck. Laryngoscopy was performed, revealing no sign of tumor in the larynx. Vocal cords are symmetric and mobile bilaterally. There is thrush in the pharynx.  Lab Findings: Lab Results  Component Value Date   WBC 6.3 04/22/2013   HGB 13.9 04/22/2013   HCT 39.5 04/22/2013   MCV 95.0 04/22/2013   PLT 195 04/22/2013    Lab  Results  Component Value Date   TSH 114.200* 03/17/2013    Radiographic Findings: No results found.  Impression/Plan:    1) Head and Neck Cancer Status: Recovering from radiotherapy, no evidence of disease  2) Nutritional Status: Weight is Stable to slightly increased since completing radiotherapy  3) Risk Factors: The patient has been educated about risk factors including alcohol and tobacco abuse; they understand that avoidance of alcohol and tobacco is important to prevent recurrences as well as other cancers. He reports he's cut down significantly on his tobacco use. He continues to smoke cigarettes, but much less than before and is also trying E. cigarettes. He will continue to try to cut down.  4) Swallowing: Functional, but effortful; continue to follow. See #6.  5) Energy: The patient was diagnosed with hypothyroidism before starting radiotherapy. This has been managed in primary care  6) Social: Patient stated that he has an appt next week at which he expects to have his [employer] renewed. At that point, he stated he will be able to proceed with a follow-up appt with [name], Speech therapist, and his ENT.  7) odynophagia -by now, the irritation from radiotherapy should have decreased significantly. Normally patients do not have significant odynophagia 2 months from completion of radiotherapy. However, thrush was identified on laryngoscopic  exam. I prescribed fluconazole for  this. I also gave him a refill of oxycodone, with instructions to decrease the frequency in which he takes it. Hopefully in the next week or 2 the soreness in his throat will resolve with the use of fluconazole.  8) Follow-up in 3 months. The patient was encouraged to call with any issues or questions before then.    I spent 20 minutes face to face with the patient and more than 50% of that time was spent in counseling and/or coordination of care. _____________________________________   [name], MD

## 2013-08-27 NOTE — Telephone Encounter (Signed)
On 08-27-13 fax medical records to [facility] in [place], it was consult note, sim and tx planning note, end of tx note, follow up note.

## 2013-08-29 ENCOUNTER — Encounter: Payer: Self-pay | Admitting: *Deleted

## 2013-08-29 ENCOUNTER — Ambulatory Visit
Admission: RE | Admit: 2013-08-29 | Discharge: 2013-08-29 | Disposition: A | Discharge status: Home / Self Care | Payer: No Typology Code available for payment source | Source: Ambulatory Visit | Attending: Radiation Oncology | Admitting: Radiation Oncology

## 2013-08-29 ENCOUNTER — Encounter: Payer: Self-pay | Admitting: Radiation Oncology

## 2013-08-29 VITALS — BP 112/66 | HR 100 | Temp 98.3°F | Ht 71.0 in | Wt 159.6 lb

## 2013-08-29 DIAGNOSIS — Z885 Allergy status to narcotic agent status: Secondary | ICD-10-CM | POA: Insufficient documentation

## 2013-08-29 DIAGNOSIS — B370 Candidal stomatitis: Secondary | ICD-10-CM | POA: Insufficient documentation

## 2013-08-29 DIAGNOSIS — E039 Hypothyroidism, unspecified: Secondary | ICD-10-CM | POA: Insufficient documentation

## 2013-08-29 DIAGNOSIS — Z79899 Other long term (current) drug therapy: Secondary | ICD-10-CM | POA: Insufficient documentation

## 2013-08-29 DIAGNOSIS — C329 Malignant neoplasm of larynx, unspecified: Secondary | ICD-10-CM | POA: Insufficient documentation

## 2013-08-29 DIAGNOSIS — C320 Malignant neoplasm of glottis: Secondary | ICD-10-CM

## 2013-08-29 DIAGNOSIS — F17200 Nicotine dependence, unspecified, uncomplicated: Secondary | ICD-10-CM | POA: Insufficient documentation

## 2013-08-29 DIAGNOSIS — R1310 Dysphagia, unspecified: Secondary | ICD-10-CM | POA: Insufficient documentation

## 2013-08-29 MED ORDER — FLUCONAZOLE 100 MG PO TABS: ORAL_TABLET | ORAL | Status: DC

## 2013-08-29 MED ORDER — OXYCODONE HCL 5 MG PO TABS: ORAL_TABLET | ORAL | Status: DC

## 2013-08-29 MED ORDER — LARYNGOSCOPY SOLUTION RAD-ONC
15.0000 mL | Freq: Once | TOPICAL | Status: DC
  Administered 2013-08-29: 15 mL via TOPICAL
  Filled 2013-08-29: qty 15

## 2013-08-29 NOTE — Progress Notes (Signed)
Mr. [name] returns today for assessment following radiation therapy for cancer of larynx.  He c/o dysphagia at a level 6 on a scale of 0-10.  He continues to have difficulty swallowing thickened saliva and states he will purchase Mucinex to help thin his secretions.  Skin in the treatment field is reddened, but intact.  His oral cavity is moist, pink and intact.

## 2013-08-29 NOTE — Progress Notes (Signed)
Met with patient and his wife during scheduled follow-up appt with Dr. Squire to provide support and care continuity.  Patient stated that he has an appt next week at which he expects to have his [employer] renewed.  At that point, he stated he will be able to proceed with a follow-up appt with Carl Schinke, Speech therapist, and his ENT.  Continuing to navigate as L3 (treatments completed) patient.  Rick Diehl, RN, BSN, [facility] Head & Neck Oncology Navigator [phone]  

## 2013-09-01 ENCOUNTER — Encounter: Payer: Self-pay | Admitting: Radiation Oncology

## 2013-09-08 ENCOUNTER — Telehealth: Payer: Self-pay | Admitting: *Deleted

## 2013-09-08 ENCOUNTER — Other Ambulatory Visit: Payer: Self-pay | Admitting: Radiation Oncology

## 2013-09-08 DIAGNOSIS — C320 Malignant neoplasm of glottis: Secondary | ICD-10-CM

## 2013-09-08 MED ORDER — MAGIC MOUTHWASH W/LIDOCAINE: ORAL | Status: DC

## 2013-09-08 NOTE — Telephone Encounter (Signed)
Mr. Appleyard wife called requesting a refill for Oxycodone. Mr Hink received a Script for Oxycodone 5 mg tabs with instructions as follows: Take 1 tab no more than 3 times a day.  He should have at least 14 tabs left at this time.  Mr Holland stated that  In the past 3 days he has taken 6 tabs daily because of increased sore/painful throat within this timeframe. When questioned, he admits to having periods of chills with periods of feeling very hot.  He is unable to check his temperature, because he does not own one.    When asked if he or his spouse have inspected his throat, he stated, "No".  Informed him that Dr. Isidore Moos will be informed.

## 2013-09-09 NOTE — Telephone Encounter (Signed)
Spoke with Dr. Isidore Moos re: patient's request for Oxycodone refill.  At her guidance, I called patient to let him know that Dr. Isidore Moos was send a Rx to his pharmacy for MMW and that her policy is not to refill narcotic Rx when not taken as prescribed, I encouraged him to take remaining pills as prescribed.  He verbalized understanding.  Per Dr. Isidore Moos, I encourage patient to schedule a follow-up appt with Dr. Wilburn Cornelia, his ENT.  Patient stated he is planning to do so s/p visit with his PCP this Thursday.  Continuing to navigate as L3 (treatments completed) patient.  Gayleen Orem, RN, BSN, [facility] Head & Neck Oncology Navigator [phone]

## 2013-09-11 ENCOUNTER — Telehealth: Payer: Self-pay | Admitting: *Deleted

## 2013-09-11 ENCOUNTER — Ambulatory Visit: Payer: Self-pay | Attending: Internal Medicine | Admitting: Internal Medicine

## 2013-09-11 ENCOUNTER — Encounter: Payer: Self-pay | Admitting: Internal Medicine

## 2013-09-11 VITALS — BP 119/68 | HR 75 | Temp 97.8°F | Resp 16 | Ht 71.0 in | Wt 159.0 lb

## 2013-09-11 DIAGNOSIS — C320 Malignant neoplasm of glottis: Secondary | ICD-10-CM

## 2013-09-11 DIAGNOSIS — Z923 Personal history of irradiation: Secondary | ICD-10-CM | POA: Insufficient documentation

## 2013-09-11 DIAGNOSIS — E039 Hypothyroidism, unspecified: Secondary | ICD-10-CM | POA: Insufficient documentation

## 2013-09-11 DIAGNOSIS — F17200 Nicotine dependence, unspecified, uncomplicated: Secondary | ICD-10-CM | POA: Insufficient documentation

## 2013-09-11 HISTORY — DX: Malignant neoplasm of glottis: C32.0

## 2013-09-11 MED ORDER — ALBUTEROL SULFATE HFA 108 (90 BASE) MCG/ACT IN AERS: 2.0000 | INHALATION_SPRAY | Freq: Four times a day (QID) | RESPIRATORY_TRACT | Status: DC | PRN

## 2013-09-11 MED ORDER — LEVOTHYROXINE SODIUM 100 MCG PO TABS: 100.0000 ug | ORAL_TABLET | Freq: Every day | ORAL | Status: DC

## 2013-09-11 NOTE — Progress Notes (Signed)
Pt is here following up on his thyroid deficiency. Pt has been without his medications for 3 weeks.

## 2013-09-11 NOTE — Patient Instructions (Signed)
Smoking Cessation Quitting smoking is important to your health and has many advantages. However, it is not always easy to quit since nicotine is a very addictive drug. Often times, people try 3 times or more before being able to quit. This document explains the best ways for you to prepare to quit smoking. Quitting takes hard work and a lot of effort, but you can do it. ADVANTAGES OF QUITTING SMOKING  You will live longer, feel better, and live better.  Your body will feel the impact of quitting smoking almost immediately.  Within 20 minutes, blood pressure decreases. Your pulse returns to its normal level.  After 8 hours, carbon monoxide levels in the blood return to normal. Your oxygen level increases.  After 24 hours, the chance of having a heart attack starts to decrease. Your breath, hair, and body stop smelling like smoke.  After 48 hours, damaged nerve endings begin to recover. Your sense of taste and smell improve.  After 72 hours, the body is virtually free of nicotine. Your bronchial tubes relax and breathing becomes easier.  After 2 to 12 weeks, lungs can hold more air. Exercise becomes easier and circulation improves.  The risk of having a heart attack, stroke, cancer, or lung disease is greatly reduced.  After 1 year, the risk of coronary heart disease is cut in half.  After 5 years, the risk of stroke falls to the same as a nonsmoker.  After 10 years, the risk of lung cancer is cut in half and the risk of other cancers decreases significantly.  After 15 years, the risk of coronary heart disease drops, usually to the level of a nonsmoker.  If you are pregnant, quitting smoking will improve your chances of having a healthy baby.  The people you live with, especially any children, will be healthier.  You will have extra money to spend on things other than cigarettes. QUESTIONS TO THINK ABOUT BEFORE ATTEMPTING TO QUIT You may want to talk about your answers with your  caregiver.  Why do you want to quit?  If you tried to quit in the past, what helped and what did not?  What will be the most difficult situations for you after you quit? How will you plan to handle them?  Who can help you through the tough times? Your family? Friends? A caregiver?  What pleasures do you get from smoking? What ways can you still get pleasure if you quit? Here are some questions to ask your caregiver:  How can you help me to be successful at quitting?  What medicine do you think would be best for me and how should I take it?  What should I do if I need more help?  What is smoking withdrawal like? How can I get information on withdrawal? GET READY  Set a quit date.  Change your environment by getting rid of all cigarettes, ashtrays, matches, and lighters in your home, car, or work. Do not let people smoke in your home.  Review your past attempts to quit. Think about what worked and what did not. GET SUPPORT AND ENCOURAGEMENT You have a better chance of being successful if you have help. You can get support in many ways.  Tell your family, friends, and co-workers that you are going to quit and need their support. Ask them not to smoke around you.  Get individual, group, or telephone counseling and support. Programs are available at local hospitals and health centers. Call your local health department for   information about programs in your area.  Spiritual beliefs and practices may help some smokers quit.  Download a "quit meter" on your computer to keep track of quit statistics, such as how long you have gone without smoking, cigarettes not smoked, and money saved.  Get a self-help book about quitting smoking and staying off of tobacco. LEARN NEW SKILLS AND BEHAVIORS  Distract yourself from urges to smoke. Talk to someone, go for a walk, or occupy your time with a task.  Change your normal routine. Take a different route to work. Drink tea instead of coffee.  Eat breakfast in a different place.  Reduce your stress. Take a hot bath, exercise, or read a book.  Plan something enjoyable to do every day. Reward yourself for not smoking.  Explore interactive web-based programs that specialize in helping you quit. GET MEDICINE AND USE IT CORRECTLY Medicines can help you stop smoking and decrease the urge to smoke. Combining medicine with the above behavioral methods and support can greatly increase your chances of successfully quitting smoking.  Nicotine replacement therapy helps deliver nicotine to your body without the negative effects and risks of smoking. Nicotine replacement therapy includes nicotine gum, lozenges, inhalers, nasal sprays, and skin patches. Some may be available over-the-counter and others require a prescription.  Antidepressant medicine helps people abstain from smoking, but how this works is unknown. This medicine is available by prescription.  Nicotinic receptor partial agonist medicine simulates the effect of nicotine in your brain. This medicine is available by prescription. Ask your caregiver for advice about which medicines to use and how to use them based on your health history. Your caregiver will tell you what side effects to look out for if you choose to be on a medicine or therapy. Carefully read the information on the package. Do not use any other product containing nicotine while using a nicotine replacement product.  RELAPSE OR DIFFICULT SITUATIONS Most relapses occur within the first 3 months after quitting. Do not be discouraged if you start smoking again. Remember, most people try several times before finally quitting. You may have symptoms of withdrawal because your body is used to nicotine. You may crave cigarettes, be irritable, feel very hungry, cough often, get headaches, or have difficulty concentrating. The withdrawal symptoms are only temporary. They are strongest when you first quit, but they will go away within  10 14 days. To reduce the chances of relapse, try to:  Avoid drinking alcohol. Drinking lowers your chances of successfully quitting.  Reduce the amount of caffeine you consume. Once you quit smoking, the amount of caffeine in your body increases and can give you symptoms, such as a rapid heartbeat, sweating, and anxiety.  Avoid smokers because they can make you want to smoke.  Do not let weight gain distract you. Many smokers will gain weight when they quit, usually less than 10 pounds. Eat a healthy diet and stay active. You can always lose the weight gained after you quit.  Find ways to improve your mood other than smoking. FOR MORE INFORMATION  www.smokefree.gov  Document Released: 07/04/2001 Document Revised: 01/09/2012 Document Reviewed: 10/19/2011 ExitCare Patient Information 2014 [employer].  

## 2013-09-11 NOTE — Telephone Encounter (Addendum)
Received calls from pt and his wife re: throat pain.  Pt saw PCP Dr. Doreene Burke ([place]) today for follow-up, then saw Dr. Wilburn Cornelia, [facility], then met me at [facility].  Per pt, Dr. Wilburn Cornelia did not scope, noted throat inflammation.  Pt stated he has not yet picked up MMW prescribed by Dr. Isidore Moos but intends to after our meeting.  I encouraged patient to use MMW as prescribed despite his reported experience of distastefulness from previous use.   He verbalized understanding.  Pt requested follow-up to see Dr. Isidore Moos to address pain medication needs.  He stated he has completed the 45 tabs of  5 mg Oxycodone she prescribed on 08/29/13.  I explained that I would notify her.    Gayleen Orem, RN, BSN, [facility] Head & Neck Oncology Navigator [phone] -

## 2013-09-11 NOTE — Progress Notes (Signed)
Patient ID: [name], male   DOB: 15-Dec-1965, 48 y.o.   MRN: [id number]   [name], is a 48 y.o. male  SEG:[id number]  VPX:[id number]  DOB - 1965-11-25  Chief Complaint  Patient presents with  . Follow-up        Subjective:   [name] is a 48 y.o. male here today for a follow up visit. Patient has history of hypothyroidism, ran out of his medications 3 weeks ago, here today for refill of his medications. 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 He continues to smoke cigarettes heavily despite repeated counseling, patient claims to have come down to about a pack per day from 2 packs per day. He has followed up with the oncologist but not the ENT surgeon. Patient has No headache, No chest pain, No abdominal pain - No Nausea, No new weakness tingling or numbness, No Cough - SOB.  Problem  Squamous Cell Carcinoma of Vocal Cord  Unspecified Hypothyroidism    ALLERGIES: Allergies  Allergen Reactions  . Fentanyl Nausea And Vomiting  . Morphine And Related Nausea And Vomiting    PAST MEDICAL HISTORY: Past Medical History  Diagnosis Date  . Hypothyroidism   . Heart murmur     child  . Headache(784.0)   . Vocal cord cancer 1963/08/07  . 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 Hx of radiation therapy 05/19/13- 06/27/13    glottic larynx, right true vocal cord 6300 cGy 28 sessions    MEDICATIONS AT HOME: Prior to Admission medications   Medication Sig Start Date End Date Taking? Authorizing Provider  oxyCODONE (OXY IR/ROXICODONE) 5 MG immediate release tablet Take 1 tablet (5 mg total) by mouth every 4 (four) hours as needed. 08/20/13  Yes Thea Silversmith, MD  albuterol (PROVENTIL HFA;VENTOLIN HFA) 108 (90 BASE) MCG/ACT inhaler Inhale 2 puffs into the lungs every 6 (six) hours as needed for wheezing or shortness of breath. 09/11/13   [name], MD  Alum &  Mag Hydroxide-Simeth (MAGIC MOUTHWASH W/LIDOCAINE) SOLN 1part nystatin, 1part benadryl, 2 parts 2% viscous lidocaine. Swallow 10 mL up to QID, 61min before meals/bedtime, for sore throat. 09/08/13   [name], MD  fluconazole (DIFLUCAN) 100 MG tablet Take 2 tablets today, then 1 tablet daily for 20 more days. 08/29/13   [name], MD  levothyroxine (SYNTHROID, LEVOTHROID) 100 MCG tablet Take 1 tablet (100 mcg total) by mouth daily. 09/11/13   [name], MD  oxyCODONE (OXY IR/ROXICODONE) 5 MG immediate release tablet Take 1 tab no more than 3 times a day 08/29/13   [name], MD     Objective:   Filed Vitals:   09/11/13 0958  BP: 119/68  Pulse: 75  Temp: 97.8 F (36.6 C)  TempSrc: Oral  Resp: 16  Height: 5\' 11"  (1.803 m)  Weight: 159 lb (72.122 kg)  SpO2: 100%    Exam General appearance : Awake, alert, not in any distress. Speech Clear. Not toxic looking HEENT: Atraumatic and Normocephalic, pupils equally reactive to light and accomodation Neck: supple, no JVD. No cervical lymphadenopathy.  Chest:Good air entry bilaterally, no added sounds  CVS: S1 S2 regular, no murmurs.  Abdomen: Bowel sounds present, Non tender and not distended with no gaurding, rigidity or rebound. Extremities: B/L Lower Ext shows no edema, both legs are warm to touch Neurology: Awake alert, and oriented X 3, CN II-XII intact, Non  focal Skin:No Rash Wounds:N/A  Data Review  Assessment & Plan   1. Squamous cell carcinoma of vocal cord  - Ambulatory referral to ENT - albuterol (PROVENTIL HFA;VENTOLIN HFA) 108 (90 BASE) MCG/ACT inhaler; Inhale 2 puffs into the lungs every 6 (six) hours as needed for wheezing or shortness of breath.  Dispense: 3 Inhaler; Refill: 3  2. Unspecified hypothyroidism  - levothyroxine (SYNTHROID, LEVOTHROID) 100 MCG tablet; Take 1 tablet (100 mcg total) by mouth daily.  Dispense: 90 tablet; Refill: 3  Patient was again counseled extensively on smoking  cessation Patient was counseled on nutrition and exercise  Return in about 3 months (around 12/09/2013), or if symptoms worsen or fail to improve, for Routine Follow Up.  The patient was given clear instructions to go to ER or return to medical center if symptoms don't improve, worsen or new problems develop. The patient verbalized understanding. The patient was told to call to get lab results if they haven't heard anything in the next week.    [name], MD, [place], [place], [place] [facility] [place], [place]   09/11/2013, 11:00 AM

## 2013-09-12 ENCOUNTER — Other Ambulatory Visit: Payer: Self-pay | Admitting: Radiation Oncology

## 2013-09-12 ENCOUNTER — Encounter: Payer: Self-pay | Admitting: Radiation Oncology

## 2013-09-12 ENCOUNTER — Ambulatory Visit
Admission: RE | Admit: 2013-09-12 | Discharge: 2013-09-12 | Disposition: A | Discharge status: Home / Self Care | Payer: No Typology Code available for payment source | Source: Ambulatory Visit | Attending: Radiation Oncology | Admitting: Radiation Oncology

## 2013-09-12 ENCOUNTER — Encounter: Payer: Self-pay | Admitting: *Deleted

## 2013-09-12 VITALS — BP 119/82 | HR 73 | Temp 98.1°F | Resp 16 | Wt 159.6 lb

## 2013-09-12 DIAGNOSIS — K029 Dental caries, unspecified: Secondary | ICD-10-CM

## 2013-09-12 DIAGNOSIS — C320 Malignant neoplasm of glottis: Secondary | ICD-10-CM

## 2013-09-12 DIAGNOSIS — B370 Candidal stomatitis: Secondary | ICD-10-CM | POA: Insufficient documentation

## 2013-09-12 DIAGNOSIS — Z79899 Other long term (current) drug therapy: Secondary | ICD-10-CM | POA: Insufficient documentation

## 2013-09-12 DIAGNOSIS — R05 Cough: Secondary | ICD-10-CM | POA: Insufficient documentation

## 2013-09-12 DIAGNOSIS — R1310 Dysphagia, unspecified: Secondary | ICD-10-CM | POA: Insufficient documentation

## 2013-09-12 DIAGNOSIS — F17200 Nicotine dependence, unspecified, uncomplicated: Secondary | ICD-10-CM | POA: Insufficient documentation

## 2013-09-12 DIAGNOSIS — R059 Cough, unspecified: Secondary | ICD-10-CM | POA: Insufficient documentation

## 2013-09-12 DIAGNOSIS — C329 Malignant neoplasm of larynx, unspecified: Secondary | ICD-10-CM | POA: Insufficient documentation

## 2013-09-12 DIAGNOSIS — J029 Acute pharyngitis, unspecified: Secondary | ICD-10-CM | POA: Insufficient documentation

## 2013-09-12 DIAGNOSIS — E039 Hypothyroidism, unspecified: Secondary | ICD-10-CM | POA: Insufficient documentation

## 2013-09-12 MED ORDER — LARYNGOSCOPY SOLUTION RAD-ONC
15.0000 mL | Freq: Once | TOPICAL | Status: AC
  Administered 2013-09-12: 15 mL via TOPICAL

## 2013-09-12 MED ORDER — FLUCONAZOLE 100 MG PO TABS: ORAL_TABLET | ORAL | Status: DC

## 2013-09-12 MED ORDER — OXYCODONE HCL 5 MG PO TABS: ORAL_TABLET | ORAL | Status: DC

## 2013-09-12 NOTE — Addendum Note (Signed)
Encounter addended by: Arlyss Repress, RN on: 09/12/2013  5:02 PM<BR>     Documentation filed: Charges VN

## 2013-09-12 NOTE — Addendum Note (Signed)
Encounter addended by: Deirdre Evener, RN on: 09/12/2013  6:44 PM<BR>     Documentation filed: Charges VN

## 2013-09-12 NOTE — Progress Notes (Signed)
Patient reports that his throat is sore 6 on a scale of 0-10. Reports a productive cough with green sputum. Reports that he saw Dr. Wilburn Cornelia yesterday and was told his throat is red and swollen. Reports he is scheduled to follow up with Dr. Wilburn Cornelia in May. Reports several days ago he ran a fever of 101 but, hasn't ran any fever in the last two days. Reports having night sweats a couple of night during the last weeks. Reports that he has been without oxycodone for 3 days. Denies nausea, vomiting, headache or dizziness. Reports he has felt bad for the last two weeks. Also, reports he was without thyroid medication for two weeks waiting to get in with his PCP. Reports when he did get in his medication was increased from 50 mcg to 100 mcg. Reports insurance was recently renewed. Also, reports that he has "bad teeth" he needs to get fixed.

## 2013-09-12 NOTE — Progress Notes (Signed)
Radiation Oncology         [phone] ________________________________  Name: [name] MRN: [id number]  Date: 09/12/2013  DOB: 1965-09-25  Follow-Up Visit Note  CC: [name], MD  [name] MD  Diagnosis and Prior Radiotherapy:   T1 N0 larynx cancer  63 Gray in 28 fractions completed 06/27/2013  Narrative:  The patient returns today for an impromptu follow-up.  Continues to have sore throat in spite of fluconazole x 2 wks. Was seen by ENT this week. Per pt, Dr. Wilburn Cornelia did not scope, noted throat inflammation   Reports that his throat is sore 6 on a scale of 0-10. A little better over the past couple days. Reports a productive cough with green sputum. Reports that he saw Dr. Wilburn Cornelia yesterday and was told his throat is red and swollen. Reports he is scheduled to follow up with Dr. Wilburn Cornelia in May. Reports several days ago he ran a fever of 101 but, hasn't ran any fever in the last two days. Reports having night sweats a couple of night during the last weeks. Reports that he has been without oxycodone for 3 days. Denies nausea, vomiting, headache or dizziness. Reports he has felt bad for the last two weeks. Also, reports he was without thyroid medication for two weeks waiting to get in with his PCP. Reports when he did get in his medication was increased from 50 mcg to 100 mcg. Reports insurance was recently renewed. Also, reports that he has "bad teeth" he needs to get fixed.   ALLERGIES:  is allergic to fentanyl and morphine and related.  Meds: Current Outpatient Prescriptions  Medication Sig Dispense Refill  . albuterol (PROVENTIL HFA;VENTOLIN HFA) 108 (90 BASE) MCG/ACT inhaler Inhale 2 puffs into the lungs every 6 (six) hours as needed for wheezing or shortness of breath.  3 Inhaler  3  . Alum & Mag Hydroxide-Simeth (MAGIC MOUTHWASH W/LIDOCAINE) SOLN 1part nystatin, 1part benadryl, 2 parts 2% viscous lidocaine. Swallow 10 mL up to QID, 80min before  meals/bedtime, for sore throat.  480 mL  2  . fluconazole (DIFLUCAN) 100 MG tablet Take 2 tablets today, then 1 tablet daily for 20 more days.  22 tablet  0  . levothyroxine (SYNTHROID, LEVOTHROID) 100 MCG tablet Take 1 tablet (100 mcg total) by mouth daily.  90 tablet  3  . oxyCODONE (OXY IR/ROXICODONE) 5 MG immediate release tablet Take 1 tab no more than 3 times a day  45 tablet  0   No current facility-administered medications for this encounter.    Physical Findings: The patient is in no acute distress. Patient is alert and oriented. Slight erythema of soft palate. After anesthetizing the nasal cavity with topical lidocaine and oxymetazoline, the flexible endoscope was introduced and passed through the nasal cavity - improved thrush in pharynx, but still present. Erythema appreciated especially over epiglottis. No evidence of cancer in pharynx/ larynx. Vocal cords clear, mobile, symmetric.    Lab Findings: Lab Results  Component Value Date   WBC 6.3 04/22/2013   HGB 13.9 04/22/2013   HCT 39.5 04/22/2013   MCV 95.0 04/22/2013   PLT 195 04/22/2013    Lab Results  Component Value Date   TSH 114.200* 03/17/2013    Radiographic Findings: No results found.  Impression/Plan:    1) Head and Neck Cancer Status:Recovering from radiotherapy, no evidence of disease   2) Nutritional Status: Weight is Stable to slightly increased since completing radiotherapy   3) Risk Factors: The patient  has been educated about risk factors including alcohol and tobacco abuse; they understand that avoidance of alcohol and tobacco is important to prevent recurrences as well as other cancers. He reports he's cut down significantly on his tobacco use. He continues to smoke cigarettes, but much less than before and is also trying E. cigarettes. He will continue to try to cut down.   4) Swallowing: Functional, but effortful; continue to follow.    5) Energy: The patient was diagnosed with hypothyroidism before  starting radiotherapy. This has been managed in primary care - just raised to 12mcg  6) Social: Just had his [employer] renewed. Was Able to see ENT for f/u this week.  7) odynophagia - could be multifactorial - suspect recent virus, plus effects of thrush in pharynx, and effects of RT.  Thrush improved today.  will continue diflucan for 2 more weeks, and take MMW with nystatin QID for at least 12 days. Refill on oxycodone given - advised to use infrequently to avoid dependence.  8) Refer back to Dr Enrique Sack for chronic dental decay. Desires extractions  9) Follow-up in 2 more weeks. The patient was encouraged to call with any issues or questions before then.    [name], MD

## 2013-09-12 NOTE — Addendum Note (Signed)
Encounter addended by: Arlyss Repress, RN on: 09/12/2013  5:13 PM<BR>     Documentation filed: Charges VN, Orders

## 2013-09-12 NOTE — Addendum Note (Signed)
Encounter addended by: Arlyss Repress, RN on: 09/12/2013  5:16 PM<BR>     Documentation filed: Inpatient MAR

## 2013-09-18 NOTE — Progress Notes (Signed)
Met with patient during appt with Dr. Isidore Moos for assessment of pain control in order to provide support and maintain care continuity.   Gayleen Orem, RN, BSN, [facility] Head & Neck Oncology Navigator [phone]

## 2013-09-22 ENCOUNTER — Ambulatory Visit (HOSPITAL_COMMUNITY): Payer: Self-pay | Admitting: Dentistry

## 2013-09-22 ENCOUNTER — Other Ambulatory Visit (HOSPITAL_COMMUNITY): Payer: Self-pay | Admitting: Dentistry

## 2013-09-22 ENCOUNTER — Encounter (HOSPITAL_COMMUNITY): Payer: Self-pay | Admitting: Dentistry

## 2013-09-22 ENCOUNTER — Encounter (INDEPENDENT_AMBULATORY_CARE_PROVIDER_SITE_OTHER): Payer: Self-pay

## 2013-09-22 VITALS — BP 112/75 | HR 72 | Temp 98.6°F

## 2013-09-22 DIAGNOSIS — C329 Malignant neoplasm of larynx, unspecified: Secondary | ICD-10-CM

## 2013-09-22 DIAGNOSIS — K083 Retained dental root: Secondary | ICD-10-CM

## 2013-09-22 DIAGNOSIS — K030 Excessive attrition of teeth: Secondary | ICD-10-CM

## 2013-09-22 DIAGNOSIS — Z923 Personal history of irradiation: Secondary | ICD-10-CM

## 2013-09-22 DIAGNOSIS — Z0189 Encounter for other specified special examinations: Secondary | ICD-10-CM

## 2013-09-22 DIAGNOSIS — K0401 Reversible pulpitis: Secondary | ICD-10-CM

## 2013-09-22 DIAGNOSIS — K029 Dental caries, unspecified: Secondary | ICD-10-CM

## 2013-09-22 DIAGNOSIS — K08409 Partial loss of teeth, unspecified cause, unspecified class: Secondary | ICD-10-CM

## 2013-09-22 DIAGNOSIS — M264 Malocclusion, unspecified: Secondary | ICD-10-CM

## 2013-09-22 DIAGNOSIS — K045 Chronic apical periodontitis: Secondary | ICD-10-CM

## 2013-09-22 DIAGNOSIS — K0889 Other specified disorders of teeth and supporting structures: Secondary | ICD-10-CM

## 2013-09-22 DIAGNOSIS — K0530 Chronic periodontitis, unspecified: Secondary | ICD-10-CM

## 2013-09-22 MED ORDER — OXYCODONE HCL 5 MG PO TABS: 5.0000 mg | ORAL_TABLET | Freq: Four times a day (QID) | ORAL | Status: DC | PRN

## 2013-09-22 NOTE — Patient Instructions (Signed)
Patient has his operating room procedure on 09/30/2013 at 7:30 AM at [facility] long. Patient will need presurgical testing appointment prior to the surgery.. Patient will need examination with Dr. Isidore Moos this Friday as scheduled. Patient to call if questions arise. Dr. Enrique Sack

## 2013-09-22 NOTE — Progress Notes (Signed)
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  Patient was seen for a preradiation therapy dental protocol examination on 05/07/2013. Patient needed extraction remaining teeth, however, dental treatment was deferred at that time  Patient had radiation therapy from 05/19/2013 through 06/27/2013 with Dr. Isidore Moos. Patient was then referred back by Dr. Isidore Moos to have" all my teeth pulled". Patient now presents for periodic oral examination and discussion of extraction procedures in the operating room with general anesthesia.  The patient currently is complaining of upper right quadrant and lower anterior tooth pain. Patient describes the pain as being intermittent in nature. Patient indicates that it is sharp and last minutes to hours at a time. The patient is interested in having all remaining teeth extracted at this time.   Medical Hx Update:  Past Medical History  Diagnosis Date  . Hypothyroidism   . Heart murmur     child  . Headache(784.0)   . 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 Hx of radiation therapy 05/19/13- 06/27/13    glottic larynx, right true vocal cord 6300 cGy 28 sessions  .  ALLERGIES/ADVERSE DRUG REACTIONS: Allergies  Allergen Reactions  . Fentanyl Nausea And Vomiting  . Morphine And Related Nausea And Vomiting    MEDICATIONS: Current Outpatient Prescriptions  Medication Sig Dispense Refill  . albuterol (PROVENTIL HFA;VENTOLIN HFA) 108 (90 BASE) MCG/ACT inhaler Inhale 2 puffs into the lungs every 6 (six) hours as needed for wheezing or shortness of breath.  3 Inhaler  3  . Alum & Mag Hydroxide-Simeth (MAGIC MOUTHWASH W/LIDOCAINE) SOLN 1part nystatin, 1part benadryl, 2 parts 2% viscous lidocaine. Swallow 10 mL up to QID, 29min before meals/bedtime, for sore  throat.  480 mL  2  . fluconazole (DIFLUCAN) 100 MG tablet Take 1 tablet daily for 7 more days after finishing initial prescription.  7 tablet  0  . levothyroxine (SYNTHROID, LEVOTHROID) 100 MCG tablet Take 1 tablet (100 mcg total) by mouth daily.  90 tablet  3  . oxyCODONE (OXY IR/ROXICODONE) 5 MG immediate release tablet Take 1 tab no more than 3 times a day  45 tablet  0   No current facility-administered medications for this visit.   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  Patient was seen for a preradiation therapy dental protocol examination on 05/07/2013. Patient needed extraction remaining teeth, however, dental treatment was deferred at that time  Patient had radiation therapy from 05/19/2013 through 06/27/2013 with Dr. Isidore Moos. Patient was then referred back by Dr. Isidore Moos to have" all my  teeth pulled". Patient now presents for periodic oral examination and discussion of extraction procedures in the operating room with general anesthesia.  The patient currently is complaining of upper right quadrant and lower anterior tooth pain. Patient describes the pain as being intermittent in nature. Patient indicates that it is sharp and last minutes to hours at a time. The patient is interested in having all remaining teeth extracted at this time.  DENTAL EXAM: General: The patient is a well-developed, slightly built male in no acute distress. Vitals: BP 112/75  Pulse 72  Temp(Src) 98.6 F (37 C) (Oral) Extraoral Exam: There is no  palpable lymphadenopathy. The patient denies acute TMJ symptoms. Intraoral  Exam: The patient has xerostomia. There are no obvious abscesses noted. Dentition: The patient is missing tooth numbers 1, 13, 14, 16, 17, 18, 19, 20, 21, 29, 30, 31, and 32 .  Tooth numbers 6, 7, 10, 11 are present as retained root segments. There is excessive attrition of remaining mandibular anterior teeth. Periodontal: The patient has chronic periodontitis with plaque and calculus collations, generalized gingival recession, generalized tooth mobility and moderate to severe bone loss. Caries:  There are multiple dental caries as per dental charting form. Endodontic:  Patient with a history of acute pulpitis symptoms. The patient may have some incipient periapical pathology around tooth numbers 4 and 5. C&B: Patient has crown the tooth numbers 8 and 9 and recently 3 cemented the crown on tooth #8 with superglue. Prosthodontic: The patient has no partial dentures. Patient is interested in having upper and lower complete dentures. Occlusion:  Patient has a poor occlusal scheme secondary to multiple missing teeth, multiple retained root segments, and lack replacement of missing teeth with dental prostheses. Radiographic Interpretation: An orthopantogram was obtained today. There are multiple missing teeth. There are multiple retained root segments. There are multiple dental caries. There is some incipient periapical radiolucency associated with multiple root apices. There is supra-eruption and drifting of the unopposed teeth into the edentulous areas.   Assessments: 1. 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 Chronic apical periodontitis 3. Chronic periodontitis with bone loss 4. Accretions 5. Multiple dental caries 6. Multiple missing teeth 7. Multiple retained root segments 8. Multiple suboptimal dental restorations 9. No history of partial dentures 10. Poor  occlusal scheme and malocclusion 11. Xerostomia  Plan:  1.  I discussed the risks, benefits, complications of various treatment options with the patient in relationship to his medical and dental conditions. We discussed various treatment options to include no treatment, total and subtotal extractions with alveoloplasty, pre-prosthetic surgery as indicated, periodontal therapy, dental restorations, root canal therapy, crown or bridge therapy, implant therapy, and replacement missing teeth as indicated.  The patient agreed to proceed with extraction remaining teeth with alveoloplasty and pre-prosthetic surgery as indicated in the operative or general anesthesia.  We discussed various complications to include bleeding, bruising, swelling, infection, fractured root tips, sinus involvement, nerve damage, mandible fracture, and risks of general anesthesia. This does not exclude other potential complications. The operating room procedure was then scheduled for 09/30/2013 at 7:30 AM at [facility] with general anesthesia.  The patient will then followup with a dentist of his choice for fabrication of upper and lower complete dentures after adequate healing.  2. Discussion of findings with Dr. Isidore Moos as indicated.  Lenn Cal, DDS

## 2013-09-23 ENCOUNTER — Encounter (HOSPITAL_COMMUNITY): Payer: Self-pay | Admitting: Pharmacy Technician

## 2013-09-24 ENCOUNTER — Other Ambulatory Visit (HOSPITAL_COMMUNITY): Payer: Self-pay | Admitting: Dentistry

## 2013-09-24 NOTE — Progress Notes (Signed)
Chest 2 view xray 04-22-13 epic

## 2013-09-24 NOTE — Patient Instructions (Addendum)
20 [name]  09/24/2013   Your procedure is scheduled on: Tuesday March 10th  Report to [place] at 530  AM.  Call this number if you have problems the morning of surgery [phone]   Remember:  Do not eat food or drink liquids :After Midnight.     Take these medicines the morning of surgery with A SIP OF WATER: ALBUTEROL INHALER IF NEEDED AND BRING INHALER AND LEAVE INHALER WITH YOUR WIFE, LEVOTHYROXINE                                SEE [place] PREPARING FOR SURGERY SHEET             You may not have any metal on your body including hair pins and piercings  Do not wear jewelry, make-up.  Do not wear lotions, powders, or perfumes. No  Deodorant is to be worn.   Men may shave face and neck.  Do not bring valuables to the hospital. [place].  Contacts, dentures or bridgework may not be worn into surgery.  Leave suitcase in the car. After surgery it may be brought to your room.  For patients admitted to the hospital, checkout time is 11:00 AM the day of discharge.   Patients discharged the day of surgery will not be allowed to drive home.  Name and phone number of your driver: Gerilyn Pilgrim [phone]  Special Instructions: N/A  Please read over the following fact sheets that you were given: [facility] Preparing for surgery sheet   FAILURE TO [place].  PATIENT SIGNATURE___________________________________________  NURSE SIGNATURE_____________________________________________

## 2013-09-25 ENCOUNTER — Encounter (HOSPITAL_COMMUNITY)
Admission: RE | Admit: 2013-09-25 | Discharge: 2013-09-25 | Disposition: A | Discharge status: Home / Self Care | Payer: No Typology Code available for payment source | Source: Ambulatory Visit | Attending: Dentistry | Admitting: Dentistry

## 2013-09-25 ENCOUNTER — Encounter (HOSPITAL_COMMUNITY): Payer: Self-pay

## 2013-09-25 DIAGNOSIS — Z01810 Encounter for preprocedural cardiovascular examination: Secondary | ICD-10-CM | POA: Insufficient documentation

## 2013-09-25 DIAGNOSIS — Z01812 Encounter for preprocedural laboratory examination: Secondary | ICD-10-CM | POA: Insufficient documentation

## 2013-09-25 HISTORY — DX: Shortness of breath: R06.02

## 2013-09-25 LAB — CBC
HCT: 38.9 % — ABNORMAL LOW (ref 39.0–52.0)
Hemoglobin: 13 g/dL (ref 13.0–17.0)
MCH: 31.8 pg (ref 26.0–34.0)
MCHC: 33.4 g/dL (ref 30.0–36.0)
MCV: 95.1 fL (ref 78.0–100.0)
PLATELETS: 175 10*3/uL (ref 150–400)
RBC: 4.09 MIL/uL — ABNORMAL LOW (ref 4.22–5.81)
RDW: 14.1 % (ref 11.5–15.5)
WBC: 4.3 10*3/uL (ref 4.0–10.5)

## 2013-09-26 ENCOUNTER — Encounter: Payer: Self-pay | Admitting: Radiation Oncology

## 2013-09-26 ENCOUNTER — Other Ambulatory Visit: Payer: Self-pay | Admitting: Radiation Oncology

## 2013-09-26 ENCOUNTER — Ambulatory Visit
Admission: RE | Admit: 2013-09-26 | Discharge: 2013-09-26 | Disposition: A | Discharge status: Home / Self Care | Payer: No Typology Code available for payment source | Source: Ambulatory Visit | Attending: Radiation Oncology | Admitting: Radiation Oncology

## 2013-09-26 ENCOUNTER — Encounter: Payer: Self-pay | Admitting: *Deleted

## 2013-09-26 DIAGNOSIS — C320 Malignant neoplasm of glottis: Secondary | ICD-10-CM

## 2013-09-26 NOTE — Progress Notes (Signed)
[name] returns today for assessment s/p radiation to his Larynx.  He has recently seen Dr. Enrique Sack and is scheduled to have all of his teeth and any retained root tips removed on Tuesday of next week.  He c/o pain in the lower anterior region of his mouth. He also c/o sensation of thick phelgm in is throat which he cannot expectorate.

## 2013-09-26 NOTE — Progress Notes (Signed)
Radiation Oncology         [phone] ________________________________  Name: [name] MRN: [id number]  Date: 09/26/2013  DOB: [birth year]  Follow-Up Visit Note  CC: [name], MD  [name], MD  Diagnosis and Prior Radiotherapy:   T1 N0 larynx cancer  63 Gray in 28 fractions completed 06/27/2013  Narrative:  The patient returns today for routine follow-up.  When I saw him 2 weeks ago, he had persistent odynophagia - I felt this could be multifactorial - suspected recent virus, plus effects of thrush in pharynx, and effects of RT. He was told to continue diflucan for 2 more weeks, and take MMW with nystatin QID for at least 12 days. Refill on oxycodone given - advised to use infrequently to avoid dependence.   Odynophagia is better.  He is not choking on food, and can eat anything, but still has sensation of mucous stuck in throat.                           ALLERGIES:  is allergic to fentanyl and morphine and related.  Meds: Current Outpatient Prescriptions  Medication Sig Dispense Refill  . albuterol (PROVENTIL HFA;VENTOLIN HFA) 108 (90 BASE) MCG/ACT inhaler Inhale 2 puffs into the lungs every 6 (six) hours as needed for wheezing or shortness of breath.  3 Inhaler  3  . fluconazole (DIFLUCAN) 100 MG tablet Take 100 mg by mouth daily. 7 day course started 09/19/13. Ends 09/25/13      . levothyroxine (SYNTHROID, LEVOTHROID) 100 MCG tablet Take 1 tablet (100 mcg total) by mouth daily.  90 tablet  3  . oxyCODONE (OXY IR/ROXICODONE) 5 MG immediate release tablet Take 1 tablet (5 mg total) by mouth every 6 (six) hours as needed for severe pain.  30 tablet  0   No current facility-administered medications for this encounter.    Physical Findings: The patient is in no acute distress. Patient is alert and oriented.  vitals were not taken for this visit.Marland Kitchen   HEENT: No oropharyngeal lesions.   Poor dentition. Heart: Regular in rate and rhythm with no murmurs, rubs, or  gallops. Chest: Clear to auscultation bilaterally, with no rhonchi, wheezes, or rales. Psychiatric: Judgment and insight are intact. Affect is appropriate.   Lab Findings: Lab Results  Component Value Date   WBC 4.3 09/25/2013   HGB 13.0 09/25/2013   HCT 38.9* 09/25/2013   MCV 95.1 09/25/2013   PLT 175 09/25/2013    Lab Results  Component Value Date   TSH 114.200* 03/17/2013    Radiographic Findings: No results found.  Impression/Plan:    1) Head and Neck Cancer Status:Recovering from radiotherapy, no evidence of disease   2) Nutritional Status: Weight is Stable    3) Risk Factors: The patient has been educated about risk factors including alcohol and tobacco abuse; they understand that avoidance of alcohol and tobacco is important to prevent recurrences as well as other cancers. He reports he's cut down significantly on his tobacco use. He continues to smoke cigarettes, but much less than before and is also trying E. cigarettes. He will continue to try to cut down.   4) Swallowing: Functional, but effortful; continue to follow.   5) Energy: The patient was diagnosed with hypothyroidism before starting radiotherapy. This has been managed in primary care - just raised to 151mcg   6) Social: Just had his [employer] renewed. Was Able to see ENT for f/u last  month  7) odynophagia -improved.  Will not refill narcotics. Continue MMW prn and OTC pain meds PRN.  Almost done with diflucan.  Still has sensation of mucous stuck in throat.  I suspect this is post-treatment subacute swelling in the esophagus +/- mucous from the irritation.  This should improve. If it does not, esophagoscopy could be considered by ENT in addition to laryngoscopy.  8) Sees Dr Enrique Sack for chronic dental decay and extractions; surgery next week  9) Follow-up in 3 mo. The patient was encouraged to call with any issues or questions before then.  _____________________________________   [name], MD

## 2013-09-27 NOTE — Progress Notes (Signed)
Met with patient and his wife during scheduled follow-up appt with Dr. Isidore Moos to provide support and maintain care continuity.  Neither patient or his wife expressed concerns or needs other than those expressed to Dr. Isidore Moos.  Pt stated is going to have remaining teeth extracted next Tuesday by Dr. Enrique Sack.  I encouraged them to call me if needs arise.  They verbalized they would.  Continuing to navigate as L3 (treatments completed) patient.  Continuing to navigate as L3 (treatments completed) patient.  Gayleen Orem, RN, BSN, [facility] Head & Neck Oncology Navigator [phone]

## 2013-09-30 ENCOUNTER — Ambulatory Visit (HOSPITAL_COMMUNITY): Payer: No Typology Code available for payment source | Admitting: Anesthesiology

## 2013-09-30 ENCOUNTER — Encounter (HOSPITAL_COMMUNITY): Payer: Self-pay | Admitting: *Deleted

## 2013-09-30 ENCOUNTER — Encounter (HOSPITAL_COMMUNITY): Payer: No Typology Code available for payment source | Admitting: Anesthesiology

## 2013-09-30 ENCOUNTER — Ambulatory Visit (HOSPITAL_COMMUNITY)
Admission: RE | Admit: 2013-09-30 | Discharge: 2013-09-30 | Disposition: A | Discharge status: Home / Self Care | Payer: No Typology Code available for payment source | Source: Ambulatory Visit | Attending: Dentistry | Admitting: Dentistry

## 2013-09-30 ENCOUNTER — Encounter (HOSPITAL_COMMUNITY)
Admission: RE | Disposition: A | Discharge status: Home / Self Care | Payer: Self-pay | Source: Ambulatory Visit | Attending: Dentistry

## 2013-09-30 DIAGNOSIS — Z8521 Personal history of malignant neoplasm of larynx: Secondary | ICD-10-CM | POA: Insufficient documentation

## 2013-09-30 DIAGNOSIS — Z923 Personal history of irradiation: Secondary | ICD-10-CM | POA: Insufficient documentation

## 2013-09-30 DIAGNOSIS — K029 Dental caries, unspecified: Secondary | ICD-10-CM | POA: Insufficient documentation

## 2013-09-30 DIAGNOSIS — K083 Retained dental root: Secondary | ICD-10-CM | POA: Diagnosis present

## 2013-09-30 DIAGNOSIS — K0530 Chronic periodontitis, unspecified: Secondary | ICD-10-CM

## 2013-09-30 DIAGNOSIS — Z79899 Other long term (current) drug therapy: Secondary | ICD-10-CM | POA: Insufficient documentation

## 2013-09-30 DIAGNOSIS — K030 Excessive attrition of teeth: Secondary | ICD-10-CM | POA: Diagnosis present

## 2013-09-30 DIAGNOSIS — F17200 Nicotine dependence, unspecified, uncomplicated: Secondary | ICD-10-CM | POA: Insufficient documentation

## 2013-09-30 DIAGNOSIS — R1310 Dysphagia, unspecified: Secondary | ICD-10-CM | POA: Insufficient documentation

## 2013-09-30 DIAGNOSIS — C320 Malignant neoplasm of glottis: Secondary | ICD-10-CM

## 2013-09-30 DIAGNOSIS — E039 Hypothyroidism, unspecified: Secondary | ICD-10-CM | POA: Insufficient documentation

## 2013-09-30 HISTORY — PX: MULTIPLE EXTRACTIONS WITH ALVEOLOPLASTY: SHX5342

## 2013-09-30 SURGERY — MULTIPLE EXTRACTION WITH ALVEOLOPLASTY
Anesthesia: General | Site: Mouth

## 2013-09-30 MED ORDER — GLYCOPYRROLATE 0.2 MG/ML IJ SOLN
INTRAMUSCULAR | Status: AC
  Filled 2013-09-30: qty 1

## 2013-09-30 MED ORDER — ROCURONIUM BROMIDE 100 MG/10ML IV SOLN
INTRAVENOUS | Status: DC | PRN
  Administered 2013-09-30 (×2): 10 mg via INTRAVENOUS
  Administered 2013-09-30: 40 mg via INTRAVENOUS

## 2013-09-30 MED ORDER — OXYMETAZOLINE HCL 0.05 % NA SOLN
NASAL | Status: DC | PRN
  Administered 2013-09-30 (×2): 2 via NASAL

## 2013-09-30 MED ORDER — PROMETHAZINE HCL 25 MG/ML IJ SOLN: 6.2500 mg | INTRAMUSCULAR | Status: DC | PRN

## 2013-09-30 MED ORDER — LIDOCAINE-EPINEPHRINE 2 %-1:100000 IJ SOLN
INTRAMUSCULAR | Status: AC
  Filled 2013-09-30: qty 10.2

## 2013-09-30 MED ORDER — ISOPROPYL ALCOHOL 70 % SOLN
Status: DC | PRN
  Administered 2013-09-30: 1 via TOPICAL

## 2013-09-30 MED ORDER — OXYCODONE-ACETAMINOPHEN 5-325 MG PO TABS
1.0000 | ORAL_TABLET | ORAL | Status: DC | PRN
  Administered 2013-09-30: 1 via ORAL
  Filled 2013-09-30: qty 1

## 2013-09-30 MED ORDER — HYDROMORPHONE HCL PF 2 MG/ML IJ SOLN
INTRAMUSCULAR | Status: AC
  Filled 2013-09-30: qty 1

## 2013-09-30 MED ORDER — BUPIVACAINE-EPINEPHRINE PF 0.5-1:200000 % IJ SOLN
INTRAMUSCULAR | Status: DC | PRN
  Administered 2013-09-30: 3.6 mL

## 2013-09-30 MED ORDER — GLYCOPYRROLATE 0.2 MG/ML IJ SOLN
INTRAMUSCULAR | Status: DC | PRN
  Administered 2013-09-30: 0.6 mg via INTRAVENOUS
  Administered 2013-09-30: 0.2 mg via INTRAVENOUS

## 2013-09-30 MED ORDER — EPHEDRINE SULFATE 50 MG/ML IJ SOLN
INTRAMUSCULAR | Status: AC
  Filled 2013-09-30: qty 1

## 2013-09-30 MED ORDER — 0.9 % SODIUM CHLORIDE (POUR BTL) OPTIME
TOPICAL | Status: DC | PRN
  Administered 2013-09-30: 1000 mL

## 2013-09-30 MED ORDER — PROPOFOL 10 MG/ML IV BOLUS
INTRAVENOUS | Status: AC
  Filled 2013-09-30: qty 20

## 2013-09-30 MED ORDER — ONDANSETRON HCL 4 MG/2ML IJ SOLN
INTRAMUSCULAR | Status: DC | PRN
  Administered 2013-09-30: 4 mg via INTRAVENOUS

## 2013-09-30 MED ORDER — OXYMETAZOLINE HCL 0.05 % NA SOLN
NASAL | Status: AC
  Filled 2013-09-30: qty 15

## 2013-09-30 MED ORDER — SODIUM CHLORIDE 0.9 % IJ SOLN
INTRAMUSCULAR | Status: AC
  Filled 2013-09-30: qty 10

## 2013-09-30 MED ORDER — LACTATED RINGERS IV SOLN: INTRAVENOUS | Status: DC

## 2013-09-30 MED ORDER — ATROPINE SULFATE 0.4 MG/ML IJ SOLN
INTRAMUSCULAR | Status: AC
  Filled 2013-09-30: qty 1

## 2013-09-30 MED ORDER — ISOPROPYL ALCOHOL 70 % SOLN
Status: AC
  Filled 2013-09-30: qty 480

## 2013-09-30 MED ORDER — EPHEDRINE SULFATE 50 MG/ML IJ SOLN
INTRAMUSCULAR | Status: DC | PRN
  Administered 2013-09-30: 10 mg via INTRAVENOUS

## 2013-09-30 MED ORDER — LIDOCAINE-EPINEPHRINE 2 %-1:100000 IJ SOLN
INTRAMUSCULAR | Status: DC | PRN
  Administered 2013-09-30: 6.8 mL

## 2013-09-30 MED ORDER — CEFAZOLIN SODIUM-DEXTROSE 2-3 GM-% IV SOLR
2.0000 g | Freq: Once | INTRAVENOUS | Status: AC
  Administered 2013-09-30: 2 g via INTRAVENOUS

## 2013-09-30 MED ORDER — FENTANYL CITRATE 0.05 MG/ML IJ SOLN: 25.0000 ug | INTRAMUSCULAR | Status: DC | PRN

## 2013-09-30 MED ORDER — SUCCINYLCHOLINE CHLORIDE 20 MG/ML IJ SOLN
INTRAMUSCULAR | Status: AC
  Filled 2013-09-30: qty 1

## 2013-09-30 MED ORDER — PROPOFOL 10 MG/ML IV BOLUS
INTRAVENOUS | Status: DC | PRN
  Administered 2013-09-30: 200 mg via INTRAVENOUS

## 2013-09-30 MED ORDER — MIDAZOLAM HCL 5 MG/5ML IJ SOLN
INTRAMUSCULAR | Status: DC | PRN
  Administered 2013-09-30: 2 mg via INTRAVENOUS

## 2013-09-30 MED ORDER — GLYCOPYRROLATE 0.2 MG/ML IJ SOLN
INTRAMUSCULAR | Status: AC
  Filled 2013-09-30: qty 3

## 2013-09-30 MED ORDER — HYDROMORPHONE HCL PF 1 MG/ML IJ SOLN
INTRAMUSCULAR | Status: DC | PRN
  Administered 2013-09-30 (×3): 0.5 mg via INTRAVENOUS

## 2013-09-30 MED ORDER — OXYCODONE-ACETAMINOPHEN 5-325 MG PO TABS: ORAL_TABLET | ORAL | Status: DC

## 2013-09-30 MED ORDER — FENTANYL CITRATE 0.05 MG/ML IJ SOLN
INTRAMUSCULAR | Status: AC
  Filled 2013-09-30: qty 5

## 2013-09-30 MED ORDER — BUPIVACAINE-EPINEPHRINE PF 0.5-1:200000 % IJ SOLN
INTRAMUSCULAR | Status: AC
  Filled 2013-09-30: qty 3.6

## 2013-09-30 MED ORDER — NEOSTIGMINE METHYLSULFATE 1 MG/ML IJ SOLN
INTRAMUSCULAR | Status: AC
  Filled 2013-09-30: qty 10

## 2013-09-30 MED ORDER — NEOSTIGMINE METHYLSULFATE 1 MG/ML IJ SOLN
INTRAMUSCULAR | Status: DC | PRN
  Administered 2013-09-30: 4 mg via INTRAVENOUS

## 2013-09-30 MED ORDER — ONDANSETRON HCL 4 MG/2ML IJ SOLN
INTRAMUSCULAR | Status: AC
  Filled 2013-09-30: qty 2

## 2013-09-30 MED ORDER — SUCCINYLCHOLINE CHLORIDE 20 MG/ML IJ SOLN
INTRAMUSCULAR | Status: DC | PRN
  Administered 2013-09-30: 120 mg via INTRAVENOUS

## 2013-09-30 MED ORDER — CEFAZOLIN SODIUM-DEXTROSE 2-3 GM-% IV SOLR
INTRAVENOUS | Status: AC
  Filled 2013-09-30: qty 50

## 2013-09-30 MED ORDER — LIDOCAINE HCL (PF) 2 % IJ SOLN
INTRAMUSCULAR | Status: DC | PRN
  Administered 2013-09-30: 75 mg via INTRADERMAL

## 2013-09-30 MED ORDER — FENTANYL CITRATE 0.05 MG/ML IJ SOLN
INTRAMUSCULAR | Status: DC | PRN
  Administered 2013-09-30: 100 ug via INTRAVENOUS
  Administered 2013-09-30: 50 ug via INTRAVENOUS
  Administered 2013-09-30: 100 ug via INTRAVENOUS

## 2013-09-30 MED ORDER — MIDAZOLAM HCL 2 MG/2ML IJ SOLN
INTRAMUSCULAR | Status: AC
  Filled 2013-09-30: qty 2

## 2013-09-30 MED ORDER — LACTATED RINGERS IV SOLN
INTRAVENOUS | Status: DC | PRN
  Administered 2013-09-30 (×2): via INTRAVENOUS

## 2013-09-30 SURGICAL SUPPLY — 24 items
ATTRACTOMAT 16X20 MAGNETIC DRP (DRAPES) ×2 IMPLANT
BAG ZIPLOCK 12X15 (MISCELLANEOUS) ×2 IMPLANT
BANDAGE EYE OVAL (MISCELLANEOUS) IMPLANT
BLADE SURG 15 STRL LF DISP TIS (BLADE) ×2 IMPLANT
BLADE SURG 15 STRL SS (BLADE) ×2
CANNULA VESSEL W/WING WO/VALVE (CANNULA) ×4 IMPLANT
GAUZE SPONGE 4X4 16PLY XRAY LF (GAUZE/BANDAGES/DRESSINGS) ×2 IMPLANT
GLOVE SURG ORTHO 8.0 STRL STRW (GLOVE) ×2 IMPLANT
GLOVE SURG SS PI 6.5 STRL IVOR (GLOVE) ×2 IMPLANT
GOWN STRL REUS W/TWL 2XL LVL3 (GOWN DISPOSABLE) ×2 IMPLANT
GOWN STRL REUS W/TWL LRG LVL3 (GOWN DISPOSABLE) ×2 IMPLANT
KIT BASIN OR (CUSTOM PROCEDURE TRAY) ×2 IMPLANT
NS IRRIG 1000ML POUR BTL (IV SOLUTION) IMPLANT
PACK EENT SPLIT (PACKS) ×2 IMPLANT
PACKING VAGINAL (PACKING) ×2 IMPLANT
SPONGE GAUZE 4X4 12PLY (GAUZE/BANDAGES/DRESSINGS) ×2 IMPLANT
SUCTION FRAZIER 12FR DISP (SUCTIONS) ×2 IMPLANT
SUT CHROMIC 3 0 PS 2 (SUTURE) ×8 IMPLANT
SUT CHROMIC 4 0 P 3 18 (SUTURE) IMPLANT
SYR 50ML LL SCALE MARK (SYRINGE) ×2 IMPLANT
TOWEL OR 17X26 10 PK STRL BLUE (TOWEL DISPOSABLE) ×2 IMPLANT
TUBING CONNECTING 10 (TUBING) ×2 IMPLANT
WATER STERILE IRR 1500ML POUR (IV SOLUTION) IMPLANT
YANKAUER SUCT BULB TIP NO VENT (SUCTIONS) ×2 IMPLANT

## 2013-09-30 NOTE — H&P (Signed)
09/30/2013  Patient:            [name] Date of Birth:  1965-08-29 MRN:                [id number]  BP 116/76  Pulse 72  Temp(Src) 97.3 F (36.3 C) (Oral)  Resp 18  SpO2 100%  Patient denies acute medical or dental changes. Please see Dr. Pearlie Oyster note below from 09/26/13 as H and P for dental OR procedures.  Dr. Enrique Sack  Editor: [name], MD (Physician)         Radiation Oncology         [phone] ________________________________   Name: [name]        MRN: [id number]         Date: 09/26/2013                        DOB: September 03, 1965   Follow-Up Visit Note   CC: Leslie Chessman, MD  Leslie Chessman, MD   Diagnosis and Prior Radiotherapy:   T1 N0 larynx cancer   63 Gray in 28 fractions completed 06/27/2013   Narrative:  The patient returns today for routine follow-up.  When I saw him 2 weeks ago, he had persistent odynophagia - I felt this could be multifactorial - suspected recent virus, plus effects of thrush in pharynx, and effects of RT. He was told to continue diflucan for 2 more weeks, and take MMW with nystatin QID for at least 12 days. Refill on oxycodone given - advised to use infrequently to avoid dependence.    Odynophagia is better.  He is not choking on food, and can eat anything, but still has sensation of mucous stuck in throat.                             ALLERGIES:  is allergic to fentanyl and morphine and related.   Meds: Current Outpatient Prescriptions   Medication  Sig  Dispense  Refill   .  albuterol (PROVENTIL HFA;VENTOLIN HFA) 108 (90 BASE) MCG/ACT inhaler  Inhale 2 puffs into the lungs every 6 (six) hours as needed for wheezing or shortness of breath.   3 Inhaler   3   .  fluconazole (DIFLUCAN) 100 MG tablet  Take 100 mg by mouth daily. 7 day course started 09/19/13. Ends 09/25/13         .  levothyroxine (SYNTHROID, LEVOTHROID) 100 MCG tablet  Take 1 tablet (100 mcg total) by mouth daily.   90 tablet   3   .  oxyCODONE  (OXY IR/ROXICODONE) 5 MG immediate release tablet  Take 1 tablet (5 mg total) by mouth every 6 (six) hours as needed for severe pain.   30 tablet   0       No current facility-administered medications for this encounter.        Physical Findings: The patient is in no acute distress. Patient is alert and oriented.  vitals were not taken for this visit.Marland Kitchen    HEENT: No oropharyngeal lesions.   Poor dentition. Heart: Regular in rate and rhythm with no murmurs, rubs, or gallops. Chest: Clear to auscultation bilaterally, with no rhonchi, wheezes, or rales. Psychiatric: Judgment and insight are intact. Affect is appropriate.     Lab Findings: Lab Results   Component  Value  Date     WBC  4.3  09/25/2013  HGB  13.0  09/25/2013     HCT  38.9*  09/25/2013     MCV  95.1  09/25/2013     PLT  175  09/25/2013         Lab Results   Component  Value  Date     TSH  114.200*  03/17/2013        Radiographic Findings: No results found.   Impression/Plan:     1) Head and Neck Cancer Status:Recovering from radiotherapy, no evidence of disease    2) Nutritional Status: Weight is Stable     3) Risk Factors: The patient has been educated about risk factors including alcohol and tobacco abuse; they understand that avoidance of alcohol and tobacco is important to prevent recurrences as well as other cancers. He reports he's cut down significantly on his tobacco use. He continues to smoke cigarettes, but much less than before and is also trying E. cigarettes. He will continue to try to cut down.    4) Swallowing: Functional, but effortful; continue to follow.    5) Energy: The patient was diagnosed with hypothyroidism before starting radiotherapy. This has been managed in primary care - just raised to 163mcg    6) Social: Just had his [employer] renewed. Was Able to see ENT for f/u last month   7) odynophagia -improved.  Will not refill narcotics. Continue MMW prn and OTC pain meds PRN.   Almost done with diflucan.  Still has sensation of mucous stuck in throat.  I suspect this is post-treatment subacute swelling in the esophagus +/- mucous from the irritation.  This should improve. If it does not, esophagoscopy could be considered by ENT in addition to laryngoscopy.   8) Sees Dr Enrique Sack for chronic dental decay and extractions; surgery next week   9) Follow-up in 3 mo. The patient was encouraged to call with any issues or questions before then.   _____________________________________    [name], MD

## 2013-09-30 NOTE — Progress Notes (Signed)
Patient getting more anxious the longer he is here in Short Stay will not keep ice pack on or keep fingers out of mouth and leave gauze alone. Stated he was getting rid of the gauze and getting a cig the minute he gets to the truck. Reminded patient he needs to follow instructions as given by doctor. Patient c/o not having enough pain med for post op care and was taking 6 Oxy Ir / day . reasssured him that the pain will be bad about 1 day and be better and to call Dr,. K if no relief.

## 2013-09-30 NOTE — Transfer of Care (Signed)
Immediate Anesthesia Transfer of Care Note  Patient: [name]  Procedure(s) Performed: Procedure(s): Extraction of tooth #'s 2,3,4,5,6,7,8,9,10,11,12,15,22,23,24,25,26,27,28 with alveoloplasty (N/A)  Patient Location: PACU  Anesthesia Type:General  Level of Consciousness: awake, sedated and patient cooperative  Airway & Oxygen Therapy: Patient Spontanous Breathing and Patient connected to nasal cannula oxygen  Post-op Assessment: Report given to PACU RN, Post -op Vital signs reviewed and stable and Patient moving all extremities X 4  Post vital signs: Reviewed and stable  Complications: No apparent anesthesia complications

## 2013-09-30 NOTE — Anesthesia Postprocedure Evaluation (Signed)
  Anesthesia Post-op Note  Patient: [name]  Procedure(s) Performed: Procedure(s) (LRB): Extraction of tooth #'s 2,3,4,5,6,7,8,9,10,11,12,15,22,23,24,25,26,27,28 with alveoloplasty (N/A)  Patient Location: PACU  Anesthesia Type: General  Level of Consciousness: awake and alert   Airway and Oxygen Therapy: Patient Spontanous Breathing  Post-op Pain: mild  Post-op Assessment: Post-op Vital signs reviewed, Patient's Cardiovascular Status Stable, Respiratory Function Stable, Patent Airway and No signs of Nausea or vomiting  Last Vitals:  Filed Vitals:   09/30/13 1121  BP: 143/88  Pulse: 61  Temp:   Resp: 16    Post-op Vital Signs: stable   Complications: No apparent anesthesia complications

## 2013-09-30 NOTE — Anesthesia Preprocedure Evaluation (Signed)
Anesthesia Evaluation  Patient identified by MRN, date of birth, ID band Patient awake    Reviewed: Allergy & Precautions, H&P , NPO status , Patient's Chart, lab work & pertinent test results  Airway Mallampati: II TM Distance: >3 FB Neck ROM: Full    Dental no notable dental hx.    Pulmonary shortness of breath, Current Smoker,  breath sounds clear to auscultation  Pulmonary exam normal       Cardiovascular negative cardio ROS  + Valvular Problems/Murmurs Rhythm:Regular Rate:Normal     Neuro/Psych  Headaches, negative psych ROS   GI/Hepatic negative GI ROS, Neg liver ROS,   Endo/Other  Hypothyroidism   Renal/GU negative Renal ROS  negative genitourinary   Musculoskeletal negative musculoskeletal ROS (+)   Abdominal   Peds negative pediatric ROS (+)  Hematology negative hematology ROS (+)   Anesthesia Other Findings   Reproductive/Obstetrics negative OB ROS                           Anesthesia Physical Anesthesia Plan  ASA: II  Anesthesia Plan: General   Post-op Pain Management:    Induction: Intravenous  Airway Management Planned: Nasal ETT  Additional Equipment:   Intra-op Plan:   Post-operative Plan: Extubation in OR  Informed Consent: I have reviewed the patients History and Physical, chart, labs and discussed the procedure including the risks, benefits and alternatives for the proposed anesthesia with the patient or authorized representative who has indicated his/her understanding and acceptance.   Dental advisory given  Plan Discussed with: CRNA  Anesthesia Plan Comments:         Anesthesia Quick Evaluation

## 2013-09-30 NOTE — Discharge Instructions (Signed)

## 2013-09-30 NOTE — Op Note (Signed)
Patient:            [name] Date of Birth:  Jun 20, 1966 MRN:                [id number]   DATE OF PROCEDURE:  09/30/2013               OPERATIVE REPORT   PREOPERATIVE DIAGNOSES: 1.   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   Chronic periodontitis 3.   Dental caries 4.   Multiple retained root segments 5.   Excessive attrition 6.   History of acute pulpitis  POSTOPERATIVE DIAGNOSES: 1.   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   Chronic periodontitis 3.   Dental caries 4.   Multiple retained root segments 5.   Excessive attrition 6.   History of acute pulpitis  OPERATIONS: 1. Multiple extraction of tooth numbers 2, 3, 4, 5, 6, 7, 8, 9, 10, 11, 12, 15, 22, 23, 24, 25, 26, 27, and 28. 2. 4 Quadrants of alveoloplasty   SURGEON: Lenn Cal, DDS  ASSISTANT: Camie Patience, (dental assistant)  ANESTHESIA: General anesthesia via nasoendotracheal tube.  MEDICATIONS: 1. Ancef 2 g IV prior to invasive dental procedures. 2. Local anesthesia with a total utilization of 4 carpules each containing 34 mg of lidocaine with 0.017 mg of epinephrine as well as 2 carpules each containing 9 mg of bupivacaine with 0.009 mg of epinephrine.  SPECIMENS: There are 19 teeth that were discarded.  DRAINS: None  CULTURES: None  COMPLICATIONS: None   ESTIMATED BLOOD LOSS: 100 mLs.  INTRAVENOUS FLUIDS: 1000 mLs of Lactated ringers solution.  INDICATIONS: The patient was recently diagnosed with squamous cell carcinoma of the vocal cord. Patient underwent radiation therapy.  A dental consultation was then requested to evaluate poor dentition and history of acute pulpitis.  The patient was examined and treatment planned for extraction of remaining teeth with alveoloplasty and pre-prosthetic surgery as needed.  This treatment plan was formulated to decrease the risks and complications  associated with dental infection from affecting the patient's systemic health.  OPERATIVE FINDINGS: Patient was examined operating room number 2.  The teeth were identified for extraction. The patient was noted be affected by chronic periodontitis, rampant dental caries, multiple retained root segments, excessive attrition, and history of acute pulpitis symptoms.   DESCRIPTION OF PROCEDURE: Patient was brought to the main operating room number 2. Patient was then placed in the supine position on the operating table. General anesthesia was then induced per the anesthesia team. The patient was then prepped and draped in the usual manner for dental medicine procedure. A timeout was performed. The patient was identified and procedures were verified. A throat pack was placed at this time. The oral cavity was then thoroughly examined with the findings noted above. The patient was then ready for dental medicine procedure as follows:  Local anesthesia was then administered sequentially with a total utilization of 4  carpules each containing 34 mg of lidocaine with 0.017 mg of epinephrine as well as 2 carpules  each containing 9 mg bupivacaine with 0.009 mg of epinephrine.  The Maxillary left and right quadrants were first approached. Anesthesia was then delivered utilizing infiltration with lidocaine with epinephrine. A #15 blade incision was then made from the maxillary right tuberosity and extended to the maxillary left tuberosity.  A  surgical flap was then carefully reflected. Appropriate amounts  of buccal and interseptal bone were then removed utilizing a surgical handpiece and bur and copious amounts of sterile water around tooth numbers 2, 3, 4, 6, 7, 10, 11, 12, 15.  The teeth were then subluxated with a series of straight elevators. Tooth numbers 2 and 3 were then removed with a 53R forceps without complications. Tooth numbers 4, 5, 6, 7, 8, 9, 10, 11, 12 were then removed with a 150 forceps without  complications. Tooth #15 was then removed with a 53L forceps without complications. Alveoloplasty was then performed utilizing a ronguers and bone file. The surgical site was then irrigated with copious amounts of sterile saline. The tissues were approximated and trimmed appropriately. The surgical site was then closed from the maxillary right tuberosity and extended to the mesial #8 utilizing 3-0 chromic gut material in a continuous interrupted suture technique x1. The maxillary left surgical site was then closed from the maxillary left tuberosity and extended the mesial #9 utilizing 3-0 chromic gut suture in a continuous interrupted suture technique x1.  At this point time, the mandibular quadrants were approached. The patient was given bilateral inferior alveolar nerve blocks and long buccal nerve blocks utilizing the bupivacaine with epinephrine. Further infiltration was then achieved utilizing the lidocaine with epinephrine. A 15 blade incision was then made from the distal of number #20 and extended to the distal of #30.  A surgical flap was then carefully reflected. Appropriate amounts of buccal and interseptal bone were then removed utilizing a surgical handpiece and copious amount of sterile water around tooth numbers 22, 27, and 28. The lower teeth were then subluxated with a series of straight elevators. Tooth numbers 22, 23, 24, 25, 26, 27, and 28 were then removed with a 151 forceps without complications. Alveoloplasty was then performed utilizing a rongeurs and bone file. The tissues were approximated and trimmed appropriately. The surgical sites were then irrigated with copious amounts of sterile saline. The mandibular left surgical site was then closed from the distal of  #20 and extended the mesial #24 utilizing 3-0 chromic gut suture in a continuous interrupted suture technique x1. The mandibular right surgical site was then closed from the distal of #30 and extended the mesial #25 utilizing 3-0  chromic gut suture in a continuous interrupted suture technique x1. 3 individual interrupted sutures then then placed to further close the surgical site as needed.  At this point time, the entire mouth was irrigated with copious amounts of sterile saline. The patient was examined for complications, seeing none, the dental medicine procedure was deemed to be complete. The throat pack was removed at this time. A series of 4 x 4 gauze were placed in the mouth to aid hemostasis. The patient was then handed over to the anesthesia team for final disposition. After an appropriate amount of time, the patient was extubated and taken to the postanesthsia care unit with stable vital signs and a good condition. All counts were correct for the dental medicine procedure. The patient was given a prescription for Percocet 5/325. Patient is to take one to 2 tablets every 6 hours as needed for pain. Patient is to return to clinic in 7-10 days for evaluation for suture removal.  Lenn Cal, DDS.

## 2013-09-30 NOTE — Progress Notes (Signed)
PRE-OPERATIVE NOTE:  09/30/2013 THEO KRUMHOLZ [id number]  VITALS: BP 116/76  Pulse 72  Temp(Src) 97.3 F (36.3 C) (Oral)  Resp 18  SpO2 100%  Lab Results  Component Value Date   WBC 4.3 09/25/2013   HGB 13.0 09/25/2013   HCT 38.9* 09/25/2013   MCV 95.1 09/25/2013   PLT 175 09/25/2013   BMET    Component Value Date/Time   NA 138 03/17/2013 1324   K 4.0 03/17/2013 1324   CL 103 03/17/2013 1324   CO2 27 03/17/2013 1324   GLUCOSE 89 03/17/2013 1324   BUN 16 03/17/2013 1324   CREATININE 1.27 03/17/2013 1324   CALCIUM 9.3 03/17/2013 1324    No results found for this basename: INR, PROTIME   No results found for this basename: PTT     Miguel Dibble presents for extraction remaining teeth with alveoloplasty and pre-prosthetic surgery as indicated in the operative with general anesthesia.    SUBJECTIVE: The patient denies any acute medical or dental changes and agrees to proceed with treatment as planned.  EXAM: No sign of acute dental changes.  ASSESSMENT: Patient is affected by chronic apical periodontitis, chronic periodontitis, rampant dental caries, and multiple retained root segments.  PLAN: Patient agrees to proceed with treatment as planned in the operating room as previously discussed and accepts the risks, benefits, complications of the proposed treatment to include bleeding, bruising, swelling, infection, nerve damage, root tip fracture, mandible fracture, sinus involvement, and anesthetic complications and other potential complications not necessarily mentioned.   Lenn Cal, DDS

## 2013-10-01 ENCOUNTER — Telehealth (HOSPITAL_COMMUNITY): Payer: Self-pay | Admitting: Dentistry

## 2013-10-01 ENCOUNTER — Other Ambulatory Visit (HOSPITAL_COMMUNITY): Payer: Self-pay | Admitting: Dentistry

## 2013-10-01 ENCOUNTER — Encounter (HOSPITAL_COMMUNITY): Payer: Self-pay | Admitting: Dentistry

## 2013-10-01 MED ORDER — OXYCODONE HCL 5 MG PO TABS: 5.0000 mg | ORAL_TABLET | ORAL | Status: DC | PRN

## 2013-10-01 NOTE — Telephone Encounter (Signed)
10/01/2013  Patient:            [name] Date of Birth:  Nov 25, 1965 MRN:                [id number]  Wife called complaining of patient having" whelps on his back and chest".  Patient feels it is due to the Percocet (oxycodone with acetaminophen) that he has been using for his dental pain since the operating room procedure yesterday.  Wife indicates that he is not having any significant breathing difficulty, swallowing difficulty, or swelling of his tongue or other oral tissues. Patient was instructed to take Benadryl 50 mg now and every 6 hours as needed. Patient was also instructed to go to the emergency room or call 911 if difficulty swallowing or breathing occurs. Patient is requesting a new prescription for Oxy IR that he took previously without any untoward reactions. The patient is to stop taking the Percocet 5/325 medication. The patient will be prescribed OxyIR and will pick up the prescription tomorrow in the dental medicine clinic. Dr. Enrique Sack

## 2013-10-08 ENCOUNTER — Ambulatory Visit (HOSPITAL_COMMUNITY): Payer: Self-pay | Admitting: Dentistry

## 2013-10-08 ENCOUNTER — Encounter (HOSPITAL_COMMUNITY): Payer: Self-pay | Admitting: Dentistry

## 2013-10-08 VITALS — BP 105/68 | HR 71 | Temp 98.3°F

## 2013-10-08 DIAGNOSIS — K000 Anodontia: Secondary | ICD-10-CM

## 2013-10-08 DIAGNOSIS — K08409 Partial loss of teeth, unspecified cause, unspecified class: Secondary | ICD-10-CM

## 2013-10-08 DIAGNOSIS — K0820 Unspecified atrophy of edentulous alveolar ridge: Secondary | ICD-10-CM

## 2013-10-08 DIAGNOSIS — K08109 Complete loss of teeth, unspecified cause, unspecified class: Secondary | ICD-10-CM

## 2013-10-08 DIAGNOSIS — K08199 Complete loss of teeth due to other specified cause, unspecified class: Secondary | ICD-10-CM

## 2013-10-08 MED ORDER — OXYCODONE HCL 5 MG PO TABS: 5.0000 mg | ORAL_TABLET | Freq: Four times a day (QID) | ORAL | Status: DC | PRN

## 2013-10-08 NOTE — Patient Instructions (Signed)
PLAN: 1. Continue salt water rinses every 2 hours while awake. 2. Brush tongue at bedtime. 3. Advance diet slowly to soft mechanical diet. 4. Use of the IR 5 mg every 6 hours as needed for pain. Prescription for 30 tablets was given to the patient today. Patient is aware that this is the last prescription he will be given for the dental pain. 5. Return to clinic in 1 month evaluation of healing and start of upper annd lower complete dentures. Quote for the cost of upper and lower complete dentures was given to the patient. 6. Call if problems arise with healing. Dr. Enrique Sack

## 2013-10-08 NOTE — Progress Notes (Signed)
POST OPERATIVE NOTE:  10/08/2013 [name] [id number]  VITALS: BP 105/68  Pulse 71  Temp(Src) 98.3 F (36.8 C) (Oral)  LABS:  Lab Results  Component Value Date   WBC 4.3 09/25/2013   HGB 13.0 09/25/2013   HCT 38.9* 09/25/2013   MCV 95.1 09/25/2013   PLT 175 09/25/2013   BMET    Component Value Date/Time   NA 138 03/17/2013 1324   K 4.0 03/17/2013 1324   CL 103 03/17/2013 1324   CO2 27 03/17/2013 1324   GLUCOSE 89 03/17/2013 1324   BUN 16 03/17/2013 1324   CREATININE 1.27 03/17/2013 1324   CALCIUM 9.3 03/17/2013 1324    No results found for this basename: INR, PROTIME   No results found for this basename: PTT     [name] is status post extraction of remaining teeth with alveoloplasty in the operating room. Patient indicates that he had an allergic reaction with the use of Percocet. Patient was represcribed oxycodone immediate release for use for dental pain. Patient now presents for evaluation of healing and suture removal as needed.  SUBJECTIVE: Patient still with some discomfort involving the maxillary anterior area. Patient indicates that most of his stitches are still present.   EXAM: There is no sign of infection, heme, or ooze. Generalized primary closure is noted. Sutures are intact.  PROCEDURE: The patient was given a chlorhexidine gluconate rinse for 30 seconds. Sutures were then removed without complication. Patient tolerated the procedure well. Patient was also given a chlorhexidine gluconate rinse after the procedure.  ASSESSMENT: Post operative course is consistent with dental procedures performed in the operating room. 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  PLAN: 1. Continue salt water rinses every 2 hours while awake. 2. Brush tongue at bedtime. 3. Advance diet slowly to soft mechanical diet. 4. Use of the IR 5 mg every 6 hours as needed for pain. Prescription for 30 tablets was given to the  patient today. Patient is aware that this is the last prescription he will be given for the dental pain. 5. Return to clinic in 1 month evaluation of healing and start of upper and lower complete dentures. Quote for the cost of upper and lower complete dentures was given to the patient. 6. Call if problems arise with healing.  Lenn Cal, DDS

## 2013-10-14 ENCOUNTER — Telehealth: Payer: Self-pay | Admitting: *Deleted

## 2013-10-18 ENCOUNTER — Emergency Department (HOSPITAL_COMMUNITY)
Admission: EM | Admit: 2013-10-18 | Discharge: 2013-10-18 | Disposition: A | Discharge status: Home / Self Care | Payer: No Typology Code available for payment source | Attending: Emergency Medicine | Admitting: Emergency Medicine

## 2013-10-18 ENCOUNTER — Encounter (HOSPITAL_COMMUNITY): Payer: Self-pay | Admitting: Emergency Medicine

## 2013-10-18 DIAGNOSIS — Z79899 Other long term (current) drug therapy: Secondary | ICD-10-CM | POA: Insufficient documentation

## 2013-10-18 DIAGNOSIS — K089 Disorder of teeth and supporting structures, unspecified: Secondary | ICD-10-CM | POA: Insufficient documentation

## 2013-10-18 DIAGNOSIS — K0889 Other specified disorders of teeth and supporting structures: Secondary | ICD-10-CM

## 2013-10-18 DIAGNOSIS — Z923 Personal history of irradiation: Secondary | ICD-10-CM | POA: Insufficient documentation

## 2013-10-18 DIAGNOSIS — Z85819 Personal history of malignant neoplasm of unspecified site of lip, oral cavity, and pharynx: Secondary | ICD-10-CM | POA: Insufficient documentation

## 2013-10-18 DIAGNOSIS — R011 Cardiac murmur, unspecified: Secondary | ICD-10-CM | POA: Insufficient documentation

## 2013-10-18 DIAGNOSIS — E039 Hypothyroidism, unspecified: Secondary | ICD-10-CM | POA: Insufficient documentation

## 2013-10-18 DIAGNOSIS — K08109 Complete loss of teeth, unspecified cause, unspecified class: Secondary | ICD-10-CM | POA: Insufficient documentation

## 2013-10-18 DIAGNOSIS — F17200 Nicotine dependence, unspecified, uncomplicated: Secondary | ICD-10-CM | POA: Insufficient documentation

## 2013-10-18 DIAGNOSIS — Z8521 Personal history of malignant neoplasm of larynx: Secondary | ICD-10-CM | POA: Insufficient documentation

## 2013-10-18 MED ORDER — OXYCODONE HCL 5 MG PO TABS: 5.0000 mg | ORAL_TABLET | Freq: Four times a day (QID) | ORAL | Status: DC | PRN

## 2013-10-18 NOTE — ED Notes (Signed)
No swelling noted to pt's mouth or lips.

## 2013-10-18 NOTE — ED Notes (Signed)
Pt had 20 teeth pulled 3 weeks ago this coming Monday in preparation for dental implants.  He now presents with pain in upper gums and bilateral maxillary sinus.  Pt was complaining of pain on March 18 according to office visit note from that date,but pt states pain is getting worse.  Pt underwent treatment for throat cancer (radiation therapy - last treatment in December 2014).  He also c/o pain in throat and sensation that there is a lump in his throat.  He is going to make an appt with Dr. Claiborne Rigg at the suggestion of his oncologist.

## 2013-10-18 NOTE — Discharge Instructions (Signed)
Please follow up with your Dentist as soon as you can. Dental Extraction A dental extraction procedure refers to a routine tooth extraction performed by your dentist. The procedure depends on where and how the tooth is positioned. The procedure can be very quick, sometimes lasting only seconds. Reasons for dental extraction include:  Tooth decay.  Infections (abcesses).  The need to make room for other teeth.  Gum disease s where the supporting bone has been destroyed.  Fractures of the tooth leaving it unrestorable.  Extra teeth (supernumerary) or grossly malformed teeth.  Baby teeththat have not fallen out in time and have not permitted the the permanent teeth to erupt properly.  In preparation for braces where there is not enough room to align the teeth properly.  Not enough room for wisdom teeth (particularly those that are impacted).  Prior to receiving radiation to the head and neck,teeth in the field of radiation may need to be extracted. LET YOUR CAREGIVER KNOW ABOUT:  Any allergies.  All medicines you are taking:  Including herbs, eye drops, over-the-counter medications, and creams.  Blood thinners (anticoagulants), aspirin or other drugs that may affect blood clotting.  Use of steroids (through mouth or as creams).  Previous problems with anesthetics, including local anesthetics.  History of bleeding or blood problems.  Previous surgery.  Possibility of pregnancy if this applies.  Smoking history.  Any health problems. RISKS AND COMPLICATIONS As with any procedure, complications may occur, but they can usually be managed by your caregiver. General surgical complications may include:  Reaction to anesthesia.  Damage to surrounding teeth, nerves, tissues, or structures.  Infection.  Bleeding. With appropriate treatment and care after surgery, the following complications are very uncommon:  Dry socket (blood clot does not form or stay in place over  empty socket). This can delay healing.  Incomplete extraction of roots.  Jawbone injury, pain, or weakness. BEFORE THE PROCEDURE  Your dental care provider will:  Take a medical and dental history.  Take an X-ray to evaluate the circumstances and how to best extract the tooth.  Do an oral exam.  Depending on the situation, antibiotics may be given before or after the extraction, or before and after.  Your caregivers may review the procedure, the local anaesthesia and/or sedation being used, and what to expect after the procedure with you.  If needed, your dentist may give you a form of sedation, either by medicine you swallow, gas, or intravenously (IV). This will help to relieve anxiety. Complicated extractions may require the use of general anaesthesia. It is important to follow your caregiver's instructions prior to your procedure to avoid complications. Steps before your procedure may include:  Alert your caregiver if you feel ill (sore throat, fever, upset stomach, etc.) in the days leading up to your procedure.  Stop taking certain medications for several days prior to your procedure such as blood thinners.  Take certain medications, such as antibiotics.  Avoid eating and drinking for several hours before the procedure. This will help you to avoid complications from the sedation or anaesthesia.  Sign a patient consent form.  Have a friend or family member drive you to the dentist and drive you home after the procedure.  Wear comfortable, loose clothing. Limit makeup and jewelry.  Quit smoking. If you are a smoker, this will raise the chances of a healing problem after your procedure. If you are thinking about quitting, talk to your surgeon about how long before the operation you should stop  smoking. You may also get help from your primary caregiver. PROCEDURE Dental extraction is typically done as an outpatient procedure. IV sedation, local anesthesia, or both may be  used. It will keep you comfortable and free of pain during the procedure.  There are 2 types of extractions:  Simple extraction involves a tooth that is visible in the mouth and above the gum line. After local anesthetic is given by injection, and the area is numbed, the dentist will loosen the tooth with a special instrument (elevator). Then another instrument (forceps) will be used to grasp the tooth and remove it from its socket. During the procedure you will feel some pressure, but you should not feel pain. If you do feel pain, tell your dentist. The open socket will be cleaned. Dressings (gauze) will be placed in the socket to reduce bleeding.  Surgical extractions are used if the tooth has not come into the mouth or the tooth is broken off below the gum line. The dentist will make a cut (incision) in the gum and may have to remove some of the bone around the tooth to aid in the removal of the tooth. After removal, stitches (sutures) may be required to close area to help in healing and control bleeding. For some surgical extractions, you may need a general anesthetic or IV sedation (through the vein). After both types of extractions, you may be given pain medication or other drugs to help healing. Other post operative instructions will be given by your dental caregiver. AFTER THE PROCEDURE  You will have gauze in your mouth where the tooth was removed. Gentle pressure on the gauze for up to 1 hour will help to control bleeding.  A blood clot will begin to form over the open socket. This is normal. Do not touch the area or rinse it.  Your pain will be controlled with medication and self-care.  You will be given detailed instructions for care after surgery. PROGNOSIS While some discomfort is normal after tooth extraction, most patients recover fully in just a few days. SEEK IMMEDIATE DENTAL CARE  You have uncontrolled bleeding, marked swelling, or severe pain.  You develop a fever,  difficulty swallowing, or other severe symptoms.  You have questions or concerns. Document Released: 07/10/2005 Document Revised: 10/02/2011 Document Reviewed: 10/14/2010 [facility] Patient Information 2014 [place], [place]. Oxycodone tablets or capsules What is this medicine? OXYCODONE (ox i KOE done) is a pain reliever. It is used to treat moderate to severe pain. This medicine may be used for other purposes; ask your health care provider or pharmacist if you have questions. COMMON BRAND NAME(S): Dazidox , Endocodone , OXECTA, OxyIR, Percolone, Roxicodone What should I tell my health care provider before I take this medicine? They need to know if you have any of these conditions: -Addison's disease -brain tumor -drug abuse or addiction -head injury -heart disease -if you frequently drink alcohol containing drinks -kidney disease or problems going to the bathroom -liver disease -lung disease, asthma, or breathing problems -mental problems -an unusual or allergic reaction to oxycodone, codeine, hydrocodone, morphine, other medicines, foods, dyes, or preservatives -pregnant or trying to get pregnant -breast-feeding How should I use this medicine? Take this medicine by mouth with a glass of water. Follow the directions on the prescription label. You can take it with or without food. If it upsets your stomach, take it with food. Take your medicine at regular intervals. Do not take it more often than directed. Do not stop taking except on  your doctor's advice. Some brands of this medicine, like Oxecta, have special instructions. Ask your doctor or pharmacist if these directions are for you: Do not cut, crush or chew this medicine. Swallow only one tablet at a time. Do not wet, soak, or lick the tablet before you take it. Talk to your pediatrician regarding the use of this medicine in children. Special care may be needed. Overdosage: If you think you have taken too much of this medicine contact  a poison control center or emergency room at once. NOTE: This medicine is only for you. Do not share this medicine with others. What if I miss a dose? If you miss a dose, take it as soon as you can. If it is almost time for your next dose, take only that dose. Do not take double or extra doses. What may interact with this medicine? -alcohol -antihistamines -certain medicines used for nausea like chlorpromazine, droperidol -erythromycin -ketoconazole -medicines for depression, anxiety, or psychotic disturbances -medicines for sleep -muscle relaxants -naloxone -naltrexone -narcotic medicines (opiates) for pain -nilotinib -phenobarbital -phenytoin -rifampin -ritonavir -voriconazole This list may not describe all possible interactions. Give your health care provider a list of all the medicines, herbs, non-prescription drugs, or dietary supplements you use. Also tell them if you smoke, drink alcohol, or use illegal drugs. Some items may interact with your medicine. What should I watch for while using this medicine? Tell your doctor or health care professional if your pain does not go away, if it gets worse, or if you have new or a different type of pain. You may develop tolerance to the medicine. Tolerance means that you will need a higher dose of the medicine for pain relief. Tolerance is normal and is expected if you take this medicine for a long time. Do not suddenly stop taking your medicine because you may develop a severe reaction. Your body becomes used to the medicine. This does NOT mean you are addicted. Addiction is a behavior related to getting and using a drug for a non-medical reason. If you have pain, you have a medical reason to take pain medicine. Your doctor will tell you how much medicine to take. If your doctor wants you to stop the medicine, the dose will be slowly lowered over time to avoid any side effects. You may get drowsy or dizzy when you first start taking this  medicine or change doses. Do not drive, use machinery, or do anything that may be dangerous until you know how the medicine affects you. Stand or sit up slowly. There are different types of narcotic medicines (opiates) for pain. If you take more than one type at the same time, you may have more side effects. Give your health care provider a list of all medicines you use. Your doctor will tell you how much medicine to take. Do not take more medicine than directed. Call emergency for help if you have problems breathing. This medicine will cause constipation. Try to have a bowel movement at least every 2 to 3 days. If you do not have a bowel movement for 3 days, call your doctor or health care professional. Your mouth may get dry. Drinking water, chewing sugarless gum, or sucking on hard candy may help. See your dentist every 6 months. What side effects may I notice from receiving this medicine? Side effects that you should report to your doctor or health care professional as soon as possible: -allergic reactions like skin rash, itching or hives, swelling of the face,  lips, or tongue -breathing problems -confusion -feeling faint or lightheaded, falls -trouble passing urine or change in the amount of urine -unusually weak or tired Side effects that usually do not require medical attention (report to your doctor or health care professional if they continue or are bothersome): -constipation -dry mouth -itching -nausea, vomiting -upset stomach This list may not describe all possible side effects. Call your doctor for medical advice about side effects. You may report side effects to FDA at 1-800-FDA-1088. Where should I keep my medicine? Keep out of the reach of children. This medicine can be abused. Keep your medicine in a safe place to protect it from theft. Do not share this medicine with anyone. Selling or giving away this medicine is dangerous and against the law. Store at room temperature between  15 and 30 degrees C (59 and 86 degrees F). Protect from light. Keep container tightly closed. This medicine may cause accidental overdose and death if it is taken by other adults, children, or pets. Flush any unused medicine down the toilet to reduce the chance of harm. Do not use the medicine after the expiration date. NOTE: This sheet is a summary. It may not cover all possible information. If you have questions about this medicine, talk to your doctor, pharmacist, or health care provider.  2014, [employer]vier/Gold Standard. (2013-03-20 13:43:33)

## 2013-10-18 NOTE — Telephone Encounter (Signed)
Patient wife called stating patient is having "a real tough time" since his extractions 2 weeks ago; gums "look like ground hamburger".  She stated patient was seen by Dr. Enrique Sack last week for follow-up to extractions.  She further stated that patient is regularly using salt water rinses and taking Oxycodone per Dr. Ritta Slot guidance.  I encouraged contacting Dr. Enrique Sack with concerns r/t to extractions.  She verbalized understanding.  Continuing to navigate as L3 (treatments completed) patient.  Gayleen Orem, RN, BSN, [facility] Head [facility] Neck Oncology Navigator [phone]

## 2013-10-18 NOTE — ED Provider Notes (Signed)
CSN: [id number]     Arrival date & time 10/18/13  1351 History   Chief Complaint  Patient presents with  . Dental Pain   Patient is a 48 y.o. male presenting with tooth pain.  Dental Pain Associated symptoms: facial swelling    This chart was scribed for non-physician practitioner, Margarita Mail, PA-C working with Orlie Dakin, MD, by Thea Alken, ED Scribe. This patient was seen in room WTR6/WTR6 and the patient's care was started at 2:45 PM.  [name] is a 48 y.o. male who presents to the Emergency Department complaining of dental pain onset 2 weeks. Pt reports that he has had his teeth removed. 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 Pt reports that he is unable to eat and can only consume liquids. He reports being prescribed oxycodone for his teeth removal but states that he is currently out of the medicaton. He reports that he cannot be seen by the dentist and was unable to get an appointment. Pt reports facial swelling and tenderness.   Past Medical History  Diagnosis Date  . Hypothyroidism   . Heart murmur     child  . Headache(784.0)   . 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 Hx of radiation therapy 05/19/13- 06/27/13    glottic larynx, right true vocal cord 6300 cGy 28 sessions  . Shortness of breath    Past Surgical History  Procedure Laterality Date  . Tonsillectomy      as a child  . [place] teeth      age 40  . Direct laryngoscopy Right 04/24/2013    Procedure: DIRECT LARYNGOSCOPY and BIOPSY OF TONGUE;  Surgeon: Jerrell Belfast, MD;  Location: [place];  Service: ENT;  Laterality: Right;  . Microlaryngoscopy Right 04/24/2013    Procedure: MICROLARYNGOSCOPY and RIGHT VOCAL CORD BIOPSY;  Surgeon: Jerrell Belfast, MD;  Location: [place];  Service: ENT;  Laterality: Right;  . Multiple extractions with alveoloplasty N/A 09/30/2013    Procedure: Extraction of tooth #'s 2,3,4,5,6,7,8,9,10,11,12,15,22,23,24,25,26,27,28 with alveoloplasty;  Surgeon: Lenn Cal, DDS;  Location: [facility];  Service: Oral Surgery;  Laterality: N/A;   Family History  Problem Relation Age of Onset  . Heart attack Father   . COPD Mother   . Emphysema Mother    History  Substance Use Topics  . Smoking status: Current Every Day Smoker -- 2.00 packs/day for 34 years    Types: Cigarettes  . Smokeless tobacco: Never Used     Comment: CIGARETTES DOWN TO 1 PACK PER 3 DAYS  . Alcohol Use: No   Review of Systems  HENT: Positive for dental problem and facial swelling.    Allergies  Fentanyl; Morphine and related; and Percocet  Home Medications   Current Outpatient Rx  Name  Route  Sig  Dispense  Refill  . albuterol (PROVENTIL HFA;VENTOLIN HFA) 108 (90 BASE) MCG/ACT inhaler   Inhalation   Inhale 2 puffs into the lungs every 6 (six) hours as needed for wheezing or shortness of breath.   3 Inhaler   3   . levothyroxine (SYNTHROID, LEVOTHROID) 100 MCG tablet   Oral   Take 1 tablet (100 mcg total) by mouth daily.   90 tablet   3   . oxyCODONE (OXY IR/ROXICODONE) 5 MG immediate release tablet   Oral   Take 1 tablet (5 mg total) by mouth every 6 (six) hours as needed for severe pain.   30 tablet   0  Vitals:BP 122/76  Pulse 54  Temp(Src) 98.4 F (36.9 C) (Oral)  Resp 20  SpO2 99% Physical Exam  Nursing note and vitals reviewed. Constitutional: He is oriented to person, place, and time. He appears well-developed and well-nourished. No distress.  HENT:  Head: Normocephalic and atraumatic.  Mouth/Throat: No oropharyngeal exudate, posterior oropharyngeal edema or posterior oropharyngeal erythema.  Teeth- tender to palpitation. No edema, erythema or bleeding.  Eyes: EOM are normal.  Neck: Neck supple.  Cardiovascular: Normal rate.   Pulmonary/Chest: Effort normal. No respiratory distress.  Musculoskeletal: Normal range of motion.  Neurological: He is alert and oriented to person, place, and time.  Skin: Skin is warm and dry.  Psychiatric: He  has a normal mood and affect. His behavior is normal.   ED Course  Procedures  DIAGNOSTIC STUDIES: Oxygen Saturation is 99% on RA, normal by my interpretation.    COORDINATION OF CARE: 2:07 PM-Discussed treatment plan and pt agreed.  Labs Review Labs Reviewed - No data to display Imaging Review No results found.   EKG Interpretation None      MDM   Final diagnoses:  Pain, dental   Patient with gum pain after extraction. Follow up with his surgeon..  No gross abscess.  Exam unconcerning for Ludwig's angina or spread of infection.  Will treat with penicillin and pain medicine.  Urged patient to follow-up with dentist.     I personally performed the services described in this documentation, which was scribed in my presence. The recorded information has been reviewed and is accurate.    Margarita Mail, PA-C 10/18/13 1625

## 2013-10-18 NOTE — ED Provider Notes (Signed)
Medical screening examination/treatment/procedure(s) were performed by non-physician practitioner and as supervising physician I was immediately available for consultation/collaboration.   EKG Interpretation None       Orlie Dakin, MD 10/18/13 [phone]

## 2013-10-24 ENCOUNTER — Telehealth: Payer: Self-pay | Admitting: Dietician

## 2013-10-24 NOTE — Telephone Encounter (Signed)
Brief Outpatient Oncology Nutrition Note  Patient has been identified to be at risk on malnutrition screen.  Wt Readings from Last 10 Encounters:  09/30/13 154 lb (69.854 kg)  09/30/13 154 lb (69.854 kg)  09/25/13 154 lb 9.6 oz (70.126 kg)  09/12/13 159 lb 9.6 oz (72.394 kg)  09/11/13 159 lb (72.122 kg)  08/29/13 159 lb 9.6 oz (72.394 kg)  08/07/13 155 lb 1.6 oz (70.353 kg)  07/25/13 158 lb 4.8 oz (71.804 kg)  07/14/13 160 lb 11.2 oz (72.893 kg)  06/27/13 156 lb 4.8 oz (70.897 kg)    Dx:  Larynx cancer   S/p XRT  Called patient due to weight.  Patient cancelled Outpatient Nutrition Appointment in November of 2014.  Patient not available. Left [facility] contact information.  Antonieta Iba, RD, LDN

## 2013-11-10 ENCOUNTER — Encounter (INDEPENDENT_AMBULATORY_CARE_PROVIDER_SITE_OTHER): Payer: Self-pay

## 2013-11-10 ENCOUNTER — Ambulatory Visit (HOSPITAL_COMMUNITY): Payer: Self-pay | Admitting: Dentistry

## 2013-11-10 ENCOUNTER — Encounter (HOSPITAL_COMMUNITY): Payer: Self-pay | Admitting: Dentistry

## 2013-11-10 VITALS — BP 111/73 | HR 69 | Temp 97.8°F

## 2013-11-10 DIAGNOSIS — K000 Anodontia: Principal | ICD-10-CM

## 2013-11-10 DIAGNOSIS — C320 Malignant neoplasm of glottis: Secondary | ICD-10-CM

## 2013-11-10 DIAGNOSIS — K08109 Complete loss of teeth, unspecified cause, unspecified class: Secondary | ICD-10-CM

## 2013-11-10 DIAGNOSIS — Z09 Encounter for follow-up examination after completed treatment for conditions other than malignant neoplasm: Secondary | ICD-10-CM

## 2013-11-10 DIAGNOSIS — Z923 Personal history of irradiation: Secondary | ICD-10-CM

## 2013-11-10 DIAGNOSIS — K0820 Unspecified atrophy of edentulous alveolar ridge: Secondary | ICD-10-CM

## 2013-11-10 DIAGNOSIS — K117 Disturbances of salivary secretion: Secondary | ICD-10-CM

## 2013-11-10 DIAGNOSIS — R682 Dry mouth, unspecified: Secondary | ICD-10-CM

## 2013-11-10 NOTE — Patient Instructions (Signed)
Continue saltwater rinses as needed to aid healing. Brush tongue daily. Avoid using Vapor smoking or cigarettes. Return to clinic as scheduled for continued upper and lower complete denture fabrication. Dr. Enrique Sack

## 2013-11-10 NOTE — Progress Notes (Signed)
11/10/2013  Patient:            [name] Date of Birth:  09-18-1965 MRN:                [id number]  BP 111/73  Pulse 69  Temp(Src) 97.8 F (36.6 C) (Oral)   ARAD BURSTON presents for start of upper and lower denture fabrication. Patient is complaining of some tenderness of the maxillary anterior alveolar ridge area and labial frenum area. The patient was evaluated by Dr. Wilburn Cornelia recently with no obvious discomfort noted at that time.  The note from Dr. Wilburn Cornelia on 11/05/13 was reviewed. The patient is complaining of some problems with swallowing and this may be related to GERD. Patient recently prescribed proton pump inhibitor. Exam: Patient is edentulous. Mild xerostomia noted. Atrophy of edentulous alveolar ridges noted. Maxillary anterior anterior vestibule shows no sign of problems with healing or inflammation. The gingival tissues associated with this area R. of the multiple type and may be intruding to some sensitivity in the area. Patient and I discussed the difference between them tissue of the palate versus the alveolar ridge area and the maxillary anterior area. The patient expresses understanding. Discussed procedures involved in upper and lower denture fabrication and prognosis for successful ability to wear dentures. Price for dentures confirmed.  Patient agrees to proceed with upper and lower denture fabrication. Procedure:  Upper and lower denture primary impressions in alginate. Lab pour. To [facility] for upper and lower denture custom tray fabrication. RTC for upper and lower denture final impressions.  Lenn Cal, DDS

## 2013-11-18 ENCOUNTER — Encounter (HOSPITAL_COMMUNITY): Payer: Self-pay | Admitting: Dentistry

## 2013-11-18 ENCOUNTER — Encounter (INDEPENDENT_AMBULATORY_CARE_PROVIDER_SITE_OTHER): Payer: Self-pay

## 2013-11-18 ENCOUNTER — Ambulatory Visit (HOSPITAL_COMMUNITY): Payer: Self-pay | Admitting: Dentistry

## 2013-11-18 VITALS — BP 109/69 | HR 68 | Temp 98.5°F

## 2013-11-18 DIAGNOSIS — K117 Disturbances of salivary secretion: Secondary | ICD-10-CM

## 2013-11-18 DIAGNOSIS — K0820 Unspecified atrophy of edentulous alveolar ridge: Secondary | ICD-10-CM

## 2013-11-18 DIAGNOSIS — Z463 Encounter for fitting and adjustment of dental prosthetic device: Secondary | ICD-10-CM

## 2013-11-18 DIAGNOSIS — K08109 Complete loss of teeth, unspecified cause, unspecified class: Secondary | ICD-10-CM

## 2013-11-18 DIAGNOSIS — K000 Anodontia: Principal | ICD-10-CM

## 2013-11-18 DIAGNOSIS — R682 Dry mouth, unspecified: Secondary | ICD-10-CM

## 2013-11-18 NOTE — Patient Instructions (Signed)
Return to clinic as scheduled for continued fabrication of upper and lower complete dentures. Dr. Kulinski 

## 2013-11-18 NOTE — Progress Notes (Signed)
11/18/2013  Patient:            [name] Date of Birth:  [birth year] MRN:                [id number]  BP 109/69  Pulse 68  Temp(Src) 98.5 F (36.9 C) (Oral)  [name] presents for continued upper and lower complete denture fabrication. Procedure:  Upper and lower border molding and final impressions in Aquasil. Patient tolerated procedure well. To [facility] for custom baseplates with rims. Return to clinic for upper and lower complete denture jaw relations.  Lenn Cal, DDS

## 2013-11-21 ENCOUNTER — Telehealth: Payer: Self-pay | Admitting: *Deleted

## 2013-11-21 ENCOUNTER — Encounter: Payer: Self-pay | Admitting: Radiation Oncology

## 2013-11-21 ENCOUNTER — Ambulatory Visit
Admission: RE | Admit: 2013-11-21 | Discharge: 2013-11-21 | Disposition: A | Discharge status: Home / Self Care | Payer: No Typology Code available for payment source | Source: Ambulatory Visit | Attending: Radiation Oncology | Admitting: Radiation Oncology

## 2013-11-21 VITALS — BP 124/81 | HR 75 | Temp 98.4°F | Ht 71.5 in | Wt 150.1 lb

## 2013-11-21 DIAGNOSIS — R1310 Dysphagia, unspecified: Secondary | ICD-10-CM

## 2013-11-21 DIAGNOSIS — F1193 Opioid use, unspecified with withdrawal: Secondary | ICD-10-CM

## 2013-11-21 DIAGNOSIS — C320 Malignant neoplasm of glottis: Secondary | ICD-10-CM

## 2013-11-21 MED ORDER — OXYCODONE HCL 5 MG PO TABS: 5.0000 mg | ORAL_TABLET | Freq: Two times a day (BID) | ORAL | Status: DC | PRN

## 2013-11-21 MED ORDER — SUCRALFATE 1 G PO TABS: ORAL_TABLET | ORAL | Status: DC

## 2013-11-21 NOTE — Progress Notes (Addendum)
Mr. Mr. Lattanzio is here today status post dental extractions of all of his teeth. He reports continued soreness of gums with tenderness and pain in his bilateral face in the ethmoid region and underneath his eyes.  He also reports pain and difficulty swallowing with a sensation of "something" in his throat as he touches his anterior neck.  His oral mucosa is moist and intact and without signs of inflammation. He has seen Dr. Enrique Sack on 11/18/13, no interventions and was seen by Dr. Wilburn Cornelia on 11/05/13 and was given a script for Omeprazole.

## 2013-11-21 NOTE — Telephone Encounter (Signed)
In follow-up to patient's VM yesterday and my return call this morning, arranged appt with Dr. Isidore Moos for today, 11/21/13, @ 1:00.  Notified pt's wife; unable to reach pt at work.  Gayleen Orem, RN, BSN, [facility] Neck Oncology Navigator [phone]

## 2013-11-21 NOTE — Progress Notes (Signed)
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 63 Gray in 28 fractions completed 06/27/2013   Narrative:  He is status post dental extractions of all of his teeth. He reports continued soreness of gums with tenderness and pain in his bilateral face in the ethmoid region and underneath his eyes. He also reports pain and difficulty swallowing with a sensation of "something" in his throat as he touches his anterior neck.  He has seen Dr. Enrique Sack on 11/18/13, no interventions and was seen by Dr. Wilburn Cornelia on 11/05/13 and was given a script for Omeprazole after GERD suspected by laryngoscopy. No esophagoscopy performed at that time. No evidence of disease in larynx per note.                               ALLERGIES:  is allergic to fentanyl; morphine and related; and percocet.  Meds: Current Outpatient Prescriptions  Medication Sig Dispense Refill  . albuterol (PROVENTIL HFA;VENTOLIN HFA) 108 (90 BASE) MCG/ACT inhaler Inhale 2 puffs into the lungs every 6 (six) hours as needed for wheezing or shortness of breath.  3 Inhaler  3  . levothyroxine (SYNTHROID, LEVOTHROID) 100 MCG tablet Take 1 tablet (100 mcg total) by mouth daily.  90 tablet  3  . omeprazole (PRILOSEC) 40 MG capsule Take 40 mg by mouth at bedtime.      Marland Kitchen oxyCODONE (OXY IR/ROXICODONE) 5 MG immediate release tablet Take 1 tablet (5 mg total) by mouth 2 (two) times daily as needed for severe pain.  20 tablet  0  . sucralfate (CARAFATE) 1 G tablet Dissolve 1 tablet in 10 mL H20 and swallow 10-20 min prior to meals and bedtime for sore throat.  60 tablet  5   No current facility-administered medications for this encounter.    Physical Findings: The patient is in no acute distress. Patient is alert and  oriented.  height is 5' 11.5" (1.816 m) and weight is 150 lb 1.6 oz (68.085 kg). His temperature is 98.4 F (36.9 C). His blood pressure is 124/81 and his pulse is 75. . The patient is in no acute distress. Patient is alert and oriented.  HEENT: No oropharyngeal lesions.His oral mucosa is moist and intact and without signs of inflammation. No dentition.   No palpable neck adenopathy in cervical or SCV regions. Alopecia of anterior neck Psychiatric: Judgment and insight are intact. Affect is appropriate.   Lab Findings: Lab Results  Component Value Date   WBC 4.3 09/25/2013   HGB 13.0 09/25/2013   HCT 38.9* 09/25/2013   MCV 95.1 09/25/2013   PLT 175 09/25/2013    Lab Results  Component Value Date   TSH 114.200* 03/17/2013    Radiographic Findings: No results found.  Impression/Plan:    1) Head and Neck Cancer Status:Recovering from radiotherapy, no evidence of disease   2) Nutritional Status: Weight is Stable   3) Risk Factors: The patient has been educated about risk factors including alcohol and tobacco abuse; they understand that avoidance of alcohol and tobacco is important to prevent recurrences as well as other cancers. He reports he is using E. cigarettes. He plans to quit these soon, and declines nicotine patches  today  4) Swallowing: recently feels some difficulty with this. Order MBSS and SLP consult  5) Energy: The patient was diagnosed with hypothyroidism before starting radiotherapy. This has been managed in primary care - just raised to 164mcg    6) Social: Just had his [employer] renewed. Was Able to see ENT for f/u this month   7) odynophagia - I spoke w/ Dr Wilburn Cornelia today.  He does not perform esophagoscopy (see #9).  Suspects GERD; continue omeprazole.  Add sucralfate.  Pt reports withdrawal symptoms after stopping oxycodone and has resumed oxycodone (5mg  BID to TID) without a recent prescription.  I will refer to pain clinic, and prescribe a 10 day supply for 5mg   BID.  8) Sees Dr Enrique Sack for f/u next week - to discuss if antibiotic is needed for maxillary symptoms post extractions. No fevers.  9) refer to gastroenterology for esophagoscopy - query etiology for odynophagia/dysphagia which usually resolve this far out from RT (rule out infection, ulcers, strictures, etc).  MBSS to be completed prior to consult.    10) Follow-up in 5 wks. The patient was encouraged to call with any issues or questions before then.  _____________________________________   [name], MD

## 2013-11-24 ENCOUNTER — Encounter: Payer: Self-pay | Admitting: Internal Medicine

## 2013-11-24 ENCOUNTER — Other Ambulatory Visit (HOSPITAL_COMMUNITY): Payer: Self-pay | Admitting: Internal Medicine

## 2013-11-24 ENCOUNTER — Telehealth: Payer: Self-pay | Admitting: *Deleted

## 2013-11-24 NOTE — Telephone Encounter (Signed)
Called patient to ask question, spoke with patient. 

## 2013-11-24 NOTE — Telephone Encounter (Signed)
Called patient to inform of appts., lvm for a return call 

## 2013-11-25 ENCOUNTER — Other Ambulatory Visit (HOSPITAL_COMMUNITY): Payer: Self-pay | Admitting: Radiation Oncology

## 2013-11-25 DIAGNOSIS — R1310 Dysphagia, unspecified: Secondary | ICD-10-CM

## 2013-11-26 ENCOUNTER — Ambulatory Visit (HOSPITAL_COMMUNITY): Payer: Self-pay | Admitting: Dentistry

## 2013-11-26 ENCOUNTER — Encounter (HOSPITAL_COMMUNITY): Payer: Self-pay | Admitting: Dentistry

## 2013-11-26 VITALS — BP 102/61 | HR 75 | Temp 98.6°F

## 2013-11-26 DIAGNOSIS — K000 Anodontia: Principal | ICD-10-CM

## 2013-11-26 DIAGNOSIS — K08109 Complete loss of teeth, unspecified cause, unspecified class: Secondary | ICD-10-CM

## 2013-11-26 DIAGNOSIS — Z463 Encounter for fitting and adjustment of dental prosthetic device: Secondary | ICD-10-CM

## 2013-11-26 DIAGNOSIS — K0820 Unspecified atrophy of edentulous alveolar ridge: Secondary | ICD-10-CM

## 2013-11-26 DIAGNOSIS — R682 Dry mouth, unspecified: Secondary | ICD-10-CM

## 2013-11-26 DIAGNOSIS — K117 Disturbances of salivary secretion: Secondary | ICD-10-CM

## 2013-11-26 NOTE — Patient Instructions (Signed)
Return to clinic as scheduled for continued upper and lower complete denture fabrication. Dr. Anijah Spohr 

## 2013-11-26 NOTE — Progress Notes (Signed)
11/26/2013  Patient:            [name] Date of Birth:  1966-04-17 MRN:                [id number]  BP 102/61  Pulse 75  Temp(Src) 98.6 F (37 C) (Oral)  [name] presents for continued denture fabrication. Procedure:  Upper and lower denture Jaw relations with aluwax bite registration. Patient agrees to tooth selection of 21 X, P, and 10 degree posteriors to match with Portrait A2 shade. Patient tolerated procedure well. RTC for denture wax try in.   Lenn Cal, DDS

## 2013-11-27 ENCOUNTER — Ambulatory Visit (HOSPITAL_COMMUNITY)
Admission: RE | Admit: 2013-11-27 | Discharge: 2013-11-27 | Disposition: A | Discharge status: Home / Self Care | Payer: No Typology Code available for payment source | Source: Ambulatory Visit | Attending: Radiation Oncology | Admitting: Radiation Oncology

## 2013-11-27 DIAGNOSIS — R1310 Dysphagia, unspecified: Secondary | ICD-10-CM | POA: Insufficient documentation

## 2013-11-27 NOTE — Procedures (Signed)
Objective Swallowing Evaluation: Modified Barium Swallowing Study  Patient Details  Name: [name] MRN: [id number] Date of Birth: 1966-05-02  Today's Date: 11/27/2013 Time: 1017-5102 SLP Time Calculation (min): 39 min  Past Medical History:  Past Medical History  Diagnosis Date  . Hypothyroidism   . Heart murmur     child  . Headache(784.0)   . 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 Hx of radiation therapy 05/19/13- 06/27/13    glottic larynx, right true vocal cord 6300 cGy 28 sessions  . Shortness of breath    Past Surgical History:  Past Surgical History  Procedure Laterality Date  . Tonsillectomy      as a child  . Wisdon teeth      age 4  . Direct laryngoscopy Right 04/24/2013    Procedure: DIRECT LARYNGOSCOPY and BIOPSY OF TONGUE;  Surgeon: [name];  Location: [place];  Service: ENT;  Laterality: Right;  . Microlaryngoscopy Right 04/24/2013    Procedure: MICROLARYNGOSCOPY and RIGHT VOCAL CORD BIOPSY;  Surgeon: [name];  Location: [place];  Service: ENT;  Laterality: Right;  . Multiple extractions with alveoloplasty N/A 09/30/2013    Procedure: Extraction of tooth #'s 2,3,4,5,6,7,8,9,10,11,12,15,22,23,24,25,26,27,28 with alveoloplasty;  Surgeon: [name];  Location: [facility];  Service: Oral Surgery;  Laterality: N/A;   HPI:  48 yo male referred by [name] for MBS - due to concern for dysphagia as pt is s/p radiation tx for laryngeal cancer.  Pt s/p XRT completed 06/27/2013 and reports pain associated with swallowing to be ongoing since treatment.  Pt eats soft foods and report occasional choking/coughing with solid foods which he attributes to lack of dentition.  Weight loss ongoing per pt but he reports eating three meals a day.  Pt denies pulmonary infections nor requiring heimlich maneuver.  Primary symptoms pt reports are globus and pain with swallowing (worse with saliva swallow than po).  Recently taking omeprazole has been beneficial and added  carafate this week has further helped per pt.   He states with use of PPI, he no longer is waking up with sensation of "something going down his throat".  Laryngoscopy negative per pt except for mild redness.  Treatment for thrush 08/29/13 with fluconazole did not help pt symtpoms per his statement.      Assessment / Plan / Recommendation Clinical Impression  Dysphagia Diagnosis: Within Functional Limits Clinical impression: Minimal oral *due to dentition* and functional pharyngeal swallow noted without aspiration, penetration or significant oropharyngeal stasis.  Pt with consistent globus - that was not worsened with intake during testing nor did it abate.  Agree with pt to his occasional choking with solids likely due to lack of dentition - as this was not occuring prior to dental extraction per pt.  Advised pt to lack of pharyngeal stasis during MBS using video monitor for feedback.  Provided compensation strategies for xerostomia and oropharyngeal exercises to decrease tissue fibrosis s/p XRT using teach back for reinforcement.  Thanks for this referral.     Treatment Recommendation  Defer treatment plan to SLP at (Comment)    Diet Recommendation Dysphagia 3 (Mechanical Soft);Thin liquid   Liquid Administration via: Cup;Straw Medication Administration: Whole meds with liquid Supervision: Patient able to self feed Compensations: Slow rate;Small sips/bites;Follow solids with liquid (start meal with liquids due to xerostomia) Postural Changes and/or Swallow Maneuvers: Seated upright 90 degrees;Upright 30-60 min after meal    Other  Recommendations Oral Care Recommendations: Oral care BID  Follow Up Recommendations  Outpatient SLP (outpatient SLP follow up at oncology team indicates )      General Date of Onset: 11/27/13 HPI: 48 yo male referred by [name] for [facility] - due to concern for dysphagia as pt is s/p radiation tx for laryngeal cancer.  Pt s/p XRT completed 06/27/2013 and reports pain  associated with swallowing to be ongoing since treatment.  Pt eats soft foods and report occasional choking/coughing with solid foods which he attributes to lack of dentition.  Weight loss ongoing per pt but he reports eating three meals a day.  Pt denies pulmonary infections nor requiring heimlich maneuver.  Primary symptoms pt reports are globus and pain with swallowing (worse with saliva swallow than po).  Recently taking omeprazole has been beneficial and added carafate this week has further helped per pt.   He states with use of PPI, he no longer is waking up with sensation of "something going down his throat".  Laryngoscopy negative per pt except for mild redness.  Treatment for thrush 08/29/13 with fluconazole did not help pt symtpoms per his statement.  Type of Study: Modified Barium Swallowing Study Reason for Referral: Objectively evaluate swallowing function Diet Prior to this Study: Thin liquids;Dysphagia 3 (soft) Temperature Spikes Noted: No Respiratory Status: Room air History of Recent Intubation: No Behavior/Cognition: Alert Oral Cavity - Dentition: Missing dentition (pt awaiting on dentures) Oral Motor / Sensory Function: Within functional limits Self-Feeding Abilities: Able to feed self Patient Positioning: Upright in chair Baseline Vocal Quality: Hoarse;Low vocal intensity Volitional Cough: Strong Volitional Swallow: Able to elicit Anatomy: Within functional limits Pharyngeal Secretions: Not observed secondary MBS    Reason for Referral Objectively evaluate swallowing function   Oral Phase Oral Preparation/Oral Phase Oral Phase: WFL Oral - Nectar Oral - Nectar Cup: Not tested Oral - Thin Oral - Thin Cup: Within functional limits Oral - Thin Straw: Within functional limits Oral - Solids Oral - Puree: Within functional limits Oral - Mechanical Soft: Within functional limits Oral - Pill: Within functional limits   Pharyngeal Phase Pharyngeal Phase Pharyngeal Phase:  Within functional limits Pharyngeal - Thin Pharyngeal - Thin Straw: Pharyngeal residue - pyriform sinuses (trace pyriform sinus stasis, cleared with cued dry swallow)  Cervical Esophageal Phase    GO    Cervical Esophageal Phase Cervical Esophageal Phase: [facility] Cervical Esophageal Phase - Comment Cervical Esophageal Comment: Barium tablet given with thin transited through pharynx easily and appeared to lodge near GE without pt sensation-  Boluses of thin, warm water, pudding and soda did not appear to clear tablet into stomach and pt appeared with tertiary contractions.  All other boluses appeared to clear without significant impairment - radiologist not present to confirm findings.     Functional Assessment Tool Used: mbs, clinical judgement Functional Limitations: Swallowing Swallow Current Status (J0093): At least 1 percent but less than 20 percent impaired, limited or restricted Swallow Goal Status [phone]): At least 1 percent but less than 20 percent impaired, limited or restricted Swallow Discharge Status [phone]): At least 1 percent but less than 20 percent impaired, limited or restricted    Claudie Fisherman, Adriauna Campton [facility] SLP [phone]

## 2013-12-01 ENCOUNTER — Ambulatory Visit: Payer: No Typology Code available for payment source

## 2013-12-01 ENCOUNTER — Ambulatory Visit: Payer: Self-pay

## 2013-12-01 ENCOUNTER — Telehealth: Payer: Self-pay | Admitting: *Deleted

## 2013-12-01 ENCOUNTER — Other Ambulatory Visit: Payer: Self-pay | Admitting: Radiation Oncology

## 2013-12-01 DIAGNOSIS — C320 Malignant neoplasm of glottis: Secondary | ICD-10-CM

## 2013-12-01 MED ORDER — OXYCODONE HCL 5 MG PO TABS: 5.0000 mg | ORAL_TABLET | Freq: Two times a day (BID) | ORAL | Status: DC | PRN

## 2013-12-01 NOTE — Telephone Encounter (Signed)
CALLED PATIENT TO INFORM THAT SCRIPT IS READY FOR PICK-UP, SPOKE WITH PATIENT AND HE IS AWARE IT IS READY FOR PICK -UP.

## 2013-12-02 ENCOUNTER — Telehealth: Payer: Self-pay | Admitting: *Deleted

## 2013-12-02 NOTE — Telephone Encounter (Signed)
Called patient to inform that his appt. With Dr. Raquel James on 01-19-14 has been cancelled and Dr. Earle Gell will be seeing him on 12-22-13 @ 3:45 p.m., line busy will call later

## 2013-12-04 ENCOUNTER — Encounter (HOSPITAL_COMMUNITY): Payer: Self-pay | Admitting: Dentistry

## 2013-12-04 ENCOUNTER — Encounter (HOSPITAL_COMMUNITY): Payer: Self-pay | Admitting: Emergency Medicine

## 2013-12-04 ENCOUNTER — Emergency Department (HOSPITAL_COMMUNITY)
Admission: EM | Admit: 2013-12-04 | Discharge: 2013-12-04 | Disposition: A | Discharge status: Home / Self Care | Payer: No Typology Code available for payment source | Attending: Emergency Medicine | Admitting: Emergency Medicine

## 2013-12-04 ENCOUNTER — Ambulatory Visit (HOSPITAL_COMMUNITY): Payer: Self-pay | Admitting: Dentistry

## 2013-12-04 VITALS — BP 101/59 | HR 73 | Temp 98.4°F

## 2013-12-04 DIAGNOSIS — K0820 Unspecified atrophy of edentulous alveolar ridge: Secondary | ICD-10-CM

## 2013-12-04 DIAGNOSIS — K117 Disturbances of salivary secretion: Secondary | ICD-10-CM

## 2013-12-04 DIAGNOSIS — K08109 Complete loss of teeth, unspecified cause, unspecified class: Secondary | ICD-10-CM

## 2013-12-04 DIAGNOSIS — Y9389 Activity, other specified: Secondary | ICD-10-CM | POA: Insufficient documentation

## 2013-12-04 DIAGNOSIS — R011 Cardiac murmur, unspecified: Secondary | ICD-10-CM | POA: Insufficient documentation

## 2013-12-04 DIAGNOSIS — R682 Dry mouth, unspecified: Secondary | ICD-10-CM

## 2013-12-04 DIAGNOSIS — Y929 Unspecified place or not applicable: Secondary | ICD-10-CM | POA: Insufficient documentation

## 2013-12-04 DIAGNOSIS — K000 Anodontia: Principal | ICD-10-CM

## 2013-12-04 DIAGNOSIS — Z79899 Other long term (current) drug therapy: Secondary | ICD-10-CM | POA: Insufficient documentation

## 2013-12-04 DIAGNOSIS — F17200 Nicotine dependence, unspecified, uncomplicated: Secondary | ICD-10-CM | POA: Insufficient documentation

## 2013-12-04 DIAGNOSIS — S3992XA Unspecified injury of lower back, initial encounter: Secondary | ICD-10-CM

## 2013-12-04 DIAGNOSIS — E039 Hypothyroidism, unspecified: Secondary | ICD-10-CM | POA: Insufficient documentation

## 2013-12-04 DIAGNOSIS — Z8521 Personal history of malignant neoplasm of larynx: Secondary | ICD-10-CM | POA: Insufficient documentation

## 2013-12-04 DIAGNOSIS — Z463 Encounter for fitting and adjustment of dental prosthetic device: Secondary | ICD-10-CM

## 2013-12-04 DIAGNOSIS — IMO0002 Reserved for concepts with insufficient information to code with codable children: Secondary | ICD-10-CM | POA: Insufficient documentation

## 2013-12-04 DIAGNOSIS — X500XXA Overexertion from strenuous movement or load, initial encounter: Secondary | ICD-10-CM | POA: Insufficient documentation

## 2013-12-04 DIAGNOSIS — Z923 Personal history of irradiation: Secondary | ICD-10-CM | POA: Insufficient documentation

## 2013-12-04 MED ORDER — CYCLOBENZAPRINE HCL 10 MG PO TABS: 10.0000 mg | ORAL_TABLET | Freq: Two times a day (BID) | ORAL | Status: DC | PRN

## 2013-12-04 MED ORDER — NAPROXEN 500 MG PO TABS: 500.0000 mg | ORAL_TABLET | Freq: Two times a day (BID) | ORAL | Status: DC

## 2013-12-04 NOTE — Patient Instructions (Signed)
Return to clinic as scheduled for continued upper and lower complete denture fabrication. Dr. Kulinski 

## 2013-12-04 NOTE — Discharge Instructions (Signed)
Take naprosyn for pain as needed. Take Flexeril for muscle spasm as needed. Refer to attached documents for more information. Follow up with your doctor for further evaluation.

## 2013-12-04 NOTE — ED Provider Notes (Signed)
CSN: [id number]     Arrival date & time 12/04/13  7510 History   First MD Initiated Contact with Patient 12/04/13 [phone]     Chief Complaint  Patient presents with  . Back Pain     (Consider location/radiation/quality/duration/timing/severity/associated sxs/prior Treatment) HPI Comments: Patient is a 48 year old male who presents with sudden onset of lower back pain that started 2 days ago. He reports bending over to pick something up when he felt a "pop" in his low back. The pain is sharp and severe and does not radiate. The pain is constant. Movement makes the pain worse. Nothing makes the pain better. Patient has tried Percocet for pain without relief. No associated symptoms. No saddles paresthesias or bladder/bowel incontinence.     Patient is a 48 y.o. male presenting with back pain.  Back Pain Associated symptoms: no abdominal pain, no chest pain, no dysuria, no fever and no weakness     Past Medical History  Diagnosis Date  . Hypothyroidism   . Heart murmur     child  . Headache(784.0)   . 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 Hx of radiation therapy 05/19/13- 06/27/13    glottic larynx, right true vocal cord 6300 cGy 28 sessions  . Shortness of breath    Past Surgical History  Procedure Laterality Date  . Tonsillectomy      as a child  . Wisdon teeth      age 46  . Direct laryngoscopy Right 04/24/2013    Procedure: DIRECT LARYNGOSCOPY and BIOPSY OF TONGUE;  Surgeon: Jerrell Belfast, MD;  Location: [place];  Service: ENT;  Laterality: Right;  . Microlaryngoscopy Right 04/24/2013    Procedure: MICROLARYNGOSCOPY and RIGHT VOCAL CORD BIOPSY;  Surgeon: Jerrell Belfast, MD;  Location: [place];  Service: ENT;  Laterality: Right;  . Multiple extractions with alveoloplasty N/A 09/30/2013    Procedure: Extraction of tooth #'s 2,3,4,5,6,7,8,9,10,11,12,15,22,23,24,25,26,27,28 with alveoloplasty;  Surgeon: Lenn Cal, DDS;  Location: [facility];  Service: Oral Surgery;  Laterality: N/A;    Family History  Problem Relation Age of Onset  . Heart attack Father   . COPD Mother   . Emphysema Mother    History  Substance Use Topics  . Smoking status: Current Every Day Smoker -- 2.00 packs/day for 34 years    Types: Cigarettes  . Smokeless tobacco: Never Used     Comment: CIGARETTES DOWN TO 1 PACK PER 3 DAYS  . Alcohol Use: No    Review of Systems  Constitutional: Negative for fever, chills and fatigue.  HENT: Negative for trouble swallowing.   Eyes: Negative for visual disturbance.  Respiratory: Negative for shortness of breath.   Cardiovascular: Negative for chest pain and palpitations.  Gastrointestinal: Negative for nausea, vomiting, abdominal pain and diarrhea.  Genitourinary: Negative for dysuria and difficulty urinating.  Musculoskeletal: Positive for back pain. Negative for arthralgias and neck pain.  Skin: Negative for color change.  Neurological: Negative for dizziness and weakness.  Psychiatric/Behavioral: Negative for dysphoric mood.      Allergies  Fentanyl; Morphine and related; and Tylenol  Home Medications   Prior to Admission medications   Medication Sig Start Date End Date Taking? Authorizing Provider  albuterol (PROVENTIL HFA;VENTOLIN HFA) 108 (90 BASE) MCG/ACT inhaler Inhale 1-2 puffs into the lungs every 6 (six) hours as needed for wheezing or shortness of breath.   Yes Historical Provider, MD  ibuprofen (ADVIL,MOTRIN) 200 MG tablet Take 400 mg by mouth daily as needed for mild pain.  Yes Historical Provider, MD  levothyroxine (SYNTHROID, LEVOTHROID) 100 MCG tablet Take 100 mcg by mouth daily before breakfast.   Yes Historical Provider, MD  omeprazole (PRILOSEC) 40 MG capsule Take 40 mg by mouth at bedtime. 11/10/13  Yes Historical Provider, MD  oxyCODONE (OXY IR/ROXICODONE) 5 MG immediate release tablet Take 5 mg by mouth every 6 (six) hours as needed for severe pain.   Yes Historical Provider, MD  sucralfate (CARAFATE) 1 G tablet Take 1 g  by mouth 4 (four) times daily -  with meals and at bedtime. *Dissolves 1 tablet in 50ml of water*   Yes Historical Provider, MD   BP 118/76  Pulse 83  Temp(Src) 98.5 F (36.9 C) (Oral)  Resp 20  Ht 5' 11.5" (1.816 m)  Wt 150 lb (68.04 kg)  BMI 20.63 kg/m2  SpO2 100% Physical Exam  Nursing note and vitals reviewed. Constitutional: He is oriented to person, place, and time. He appears well-developed and well-nourished. No distress.  HENT:  Head: Normocephalic and atraumatic.  Eyes: Conjunctivae and EOM are normal.  Neck: Normal range of motion.  Cardiovascular: Normal rate and regular rhythm.  Exam reveals no gallop and no friction rub.   No murmur heard. Pulmonary/Chest: Effort normal and breath sounds normal. He has no wheezes. He has no rales. He exhibits no tenderness.  Musculoskeletal: Normal range of motion.  No midline spine tenderness to palpation. Right paraspinal lumbar tenderness to palpation.   Neurological: He is alert and oriented to person, place, and time. Coordination normal.  Lower extremity strength and sensation equal and intact bilaterally. Speech is goal-oriented. Moves limbs without ataxia.   Skin: Skin is warm and dry.  Psychiatric: He has a normal mood and affect. His behavior is normal.    ED Course  Procedures (including critical care time) Labs Review Labs Reviewed - No data to display  Imaging Review No results found.   EKG Interpretation None      MDM   Final diagnoses:  Back injury    8:17 AM Patient likely has a muscle strain of his right lumbar area. Patient will have flexeril and Naprosyn for pain. Patient advised to follow up with his PCP for further evaluation. No bladder/bowel incontinence or saddle paresthesias. No neuro deficits. Vitals stable and patient afebrile.     Alvina Chou, [place] 12/04/13 [phone]

## 2013-12-04 NOTE — ED Notes (Signed)
Pt in c/o R lower back pain onset x 2 days ago, states, "I was at work & bent down & heard something pop. Since then I have been having terrible pain that feels like I have been shocked." denies relief with Percocet, A&Ox 4

## 2013-12-04 NOTE — ED Provider Notes (Signed)
Medical screening examination/treatment/procedure(s) were performed by non-physician practitioner and as supervising physician I was immediately available for consultation/collaboration.   EKG Interpretation None        Blanchard Kelch, MD 12/04/13 2016

## 2013-12-04 NOTE — Progress Notes (Signed)
12/04/2013  Patient:            [name] Date of Birth:  06/29/1966 MRN:                [id number]  BP 101/59  Pulse 73  Temp(Src) 98.4 F (36.9 C) (Oral)  [name] presents for continued upper and lower denture fabrication. Procedure:  Upper and lower denture wax tryin. Patient accepts esthetics, phonetics, fit and function. Patient agrees to process "as is" in [place] [address]. Patient to RTC for  upper and lower denture insertion.  Lenn Cal, DDS

## 2013-12-04 NOTE — Progress Notes (Signed)
  CARE MANAGEMENT ED NOTE 12/04/2013  Patient:  [name], [name]   Account Number:  [id number]  Date Initiated:  12/04/2013  Documentation initiated by:  Edwyna Shell  Subjective/Objective Assessment:   48 yo male called after dischrage with concern about discharge medications     Subjective/Objective Assessment Detail:   HPI Comments: Patient is a 48 year old male who presents with sudden onset of lower back pain that started 2 days ago. He reports bending over to pick something up when he felt a "pop" in his low back. The pain is sharp and severe and does not radiate. The pain is constant. Movement makes the pain worse. Nothing makes the pain better. Patient has tried Percocet for pain without relief. No associated symptoms. No saddles paresthesias or bladder/bowel incontinence.     Action/Plan:   Recommended that he call his PCP at the [facility] and make a follow up appointment. Patient stated that he has an appointmentnext week.   Action/Plan Detail:   Anticipated DC Date:       Status Recommendation to Physician:   Result of Recommendation:  Agreed    DC Planning Services  Other    Choice offered to / List presented to:            Status of service:  Completed, signed off  ED Comments:   ED Comments Detail:  CM spoke with patient regarding his concerns about the discharge medications. Patient stated that he was discharged with prescriptions for Flexeril and Naproxen and he took the Flexeril this morning and he is feeling "drunk". Reviewed patient home medications and explained that when taking the Flexeril with his scheduled Oxy that it can have an increased effect. Encouraged him to follow up with his PCP. Patient stated he is also followed at the cancer center. Encouraged patient to follow up at the cancer center also since they are the prescribers of his pain medications. Patient verbalized understanding and appreciation. He stated that he is not going to take anymore Flexeril  until he can be seen by his PCP.

## 2013-12-05 ENCOUNTER — Ambulatory Visit: Payer: No Typology Code available for payment source | Admitting: Radiation Oncology

## 2013-12-05 ENCOUNTER — Telehealth: Payer: Self-pay | Admitting: *Deleted

## 2013-12-05 NOTE — Telephone Encounter (Signed)
Called and spoke with Dossie Arbour spouse [name].  Mr. Delker was seen in the ED on yesterday for a back injury.  He was given Flexeril and Naprosyn.  He left a message stating that flexeril made him very sleepy and he was afraid to take it with his pain medication.  He also stated he needed more pain medication for this new presentation.  His current Percocet script, from Dr.Squire, does not expire until the 20th of May.  Dr. Tammi Klippel advised for Mr Cen to see his PCP at [facility] for management of the new presentation of back pain and this was relayed to his spouse.  She stated she would contact him at work.

## 2013-12-05 NOTE — Telephone Encounter (Signed)
[name] called and this RN reiterated that he needs to see his PCP for his new presentation of back pain.  He reports that while at work, he bent over and heard this "pop' in his back which resulted in a ED visit on 12/04/13.  As of today, he is experiencing shooting pains down his right leg. Given advice to call his PCP today, take his Flexeril as prescribed or 1/2 if it makes him too sleepy, take his Oxycodone as prescribed by Dr. Isidore Moos, and take his Naprosyn as ordered by the ED physician. He stated he may only take Flexeril at bedtime.  He stated he may wait to call PCP since he already has a scheduled appointment on 12/11/13.   His script for Oxycodone can be refilled on 12/10/13.

## 2013-12-08 ENCOUNTER — Ambulatory Visit: Payer: Self-pay

## 2013-12-08 ENCOUNTER — Ambulatory Visit: Payer: No Typology Code available for payment source | Attending: Radiation Oncology

## 2013-12-09 ENCOUNTER — Other Ambulatory Visit: Payer: Self-pay | Admitting: Radiation Oncology

## 2013-12-09 ENCOUNTER — Telehealth: Payer: Self-pay | Admitting: *Deleted

## 2013-12-09 DIAGNOSIS — C320 Malignant neoplasm of glottis: Secondary | ICD-10-CM

## 2013-12-09 MED ORDER — OXYCODONE HCL 5 MG PO TABS: 5.0000 mg | ORAL_TABLET | Freq: Four times a day (QID) | ORAL | Status: DC | PRN

## 2013-12-09 NOTE — Telephone Encounter (Signed)
Call from [place] w/ [facility] states that pt was not accepted.  Their phone # [phone].  Forwarded message to Dr. Pearlie Oyster nurse, Patric Dykes.

## 2013-12-11 ENCOUNTER — Ambulatory Visit: Payer: Self-pay | Admitting: Internal Medicine

## 2013-12-16 ENCOUNTER — Encounter: Payer: Self-pay | Admitting: Radiation Oncology

## 2013-12-16 ENCOUNTER — Other Ambulatory Visit: Payer: Self-pay | Admitting: Radiation Oncology

## 2013-12-16 ENCOUNTER — Encounter (HOSPITAL_COMMUNITY): Payer: Self-pay | Admitting: Dentistry

## 2013-12-16 ENCOUNTER — Ambulatory Visit (HOSPITAL_COMMUNITY): Payer: Self-pay | Admitting: Dentistry

## 2013-12-16 VITALS — BP 104/60 | HR 74 | Temp 98.9°F

## 2013-12-16 DIAGNOSIS — K117 Disturbances of salivary secretion: Secondary | ICD-10-CM

## 2013-12-16 DIAGNOSIS — R1310 Dysphagia, unspecified: Secondary | ICD-10-CM

## 2013-12-16 DIAGNOSIS — Z463 Encounter for fitting and adjustment of dental prosthetic device: Secondary | ICD-10-CM

## 2013-12-16 DIAGNOSIS — R682 Dry mouth, unspecified: Secondary | ICD-10-CM

## 2013-12-16 DIAGNOSIS — C320 Malignant neoplasm of glottis: Secondary | ICD-10-CM

## 2013-12-16 DIAGNOSIS — K08109 Complete loss of teeth, unspecified cause, unspecified class: Secondary | ICD-10-CM

## 2013-12-16 DIAGNOSIS — R1319 Other dysphagia: Secondary | ICD-10-CM

## 2013-12-16 DIAGNOSIS — K000 Anodontia: Principal | ICD-10-CM

## 2013-12-16 DIAGNOSIS — K0820 Unspecified atrophy of edentulous alveolar ridge: Secondary | ICD-10-CM

## 2013-12-16 MED ORDER — OXYCODONE HCL 5 MG PO TABS: 5.0000 mg | ORAL_TABLET | Freq: Four times a day (QID) | ORAL | Status: DC | PRN

## 2013-12-16 MED ORDER — SUCRALFATE 1 G PO TABS: 1.0000 g | ORAL_TABLET | Freq: Three times a day (TID) | ORAL | Status: DC

## 2013-12-16 NOTE — Patient Instructions (Signed)
Instructions for Denture Use and Care  Congratulations, you are on the way to oral rehabilitation!  You have just received a new set of complete or partial dentures.  These prostheses will help to improve both your appearance and chewing ability.  These instructions will help you get adjusted to your dentures as well as care for them properly.  Please read these instructions carefully and completely as soon as you get home.  If you or your caregiver have any questions please notify the [facility] at [phone].  HOW YOUR DENTURES LOOK AND FEEL Soon after you begin wearing your dentures, you may feel that your dentures are too large or even loose.  As our mouth and facial muscles become accustomed to the dentures, these feelings will go away.  You also may feel that you are salivating more than you normally do.  This feeling should go away as you get used to having the dentures in your mouth.  You may bite your cheek or your tongue; this will eventually resolve itself as you wear your dentures.  Some soreness is to be expected, but you should not hurt.  If your mouth hurts, call your dentist.  A denture adhesive may occasionally be necessary to hold your dentures in place more securely.  The dentist will let you know when one is recommended for you.  SPEAKING Wearing dentures will change the sound of your voice initially.  This will be noticed by you more than anyone else.  Bite and swallow before you speak, in order to place your dentures in position so that you may speak more clearly.  Practice speaking by reading aloud or counting from 1 to 100 very slowly and distinctly.  After some practice your mouth will become accustomed to your dentures and you will speak more clearly.  EATING Chewing will definitely be different after you receive your dentures.  With a little practice and patience you should be able to eat just about any kind of food.  Begin by eating small quantities of food  that are cut into small pieces.  Star with soft foods such as eggs, cooked vegetables, or puddings.  As you gain confidence advance  Your diet to whatever texture foods you can tolerate.  DENTURE CARE Dentures can collect plaque and calculus much the same as natural teeth can.  If not removed on a regular basis, your dentures will not look or feel clean, and you will experience denture odor.  It is very important that you remove your dentures at bedtime and clean them thoroughly.  You should: 1. Clean your dentures over a sink full of water so if dropped, breakage will be prevented. 2. Rinse your dentures with cool water to remove any large food particles. 3. Use soap and water or a denture cleanser or paste to clean the dentures.  Do not use regular toothpaste as it may abrade the denture base or teeth. 4. Use a moistened denture brush to clean all surfaces (inside and outside). 5. Rinse thoroughly to remove any remaining soap or denture cleanser. 6. Use a soft bristle toothbrush to gently brush any natural teeth, gums, tongue, and palate at bedtime and before reinserting your dentures. 7. Do not sleep with your dentures in your mouth at night.  Remove your dentures and soak them overnight in a denture cup filled with water or denture solution as recommended by your dentist.  This routine will become second nature and will increase the life and comfort   of your dentures.  Please do not try to adjust these dentures yourself; you could damage them.  FOLLOW-UP You should call or make an appointment with your dentist.  Your dentist would like to see you at least once a year for a check-up and examination. 

## 2013-12-16 NOTE — Progress Notes (Signed)
12/16/2013  Patient:            [name] Date of Birth:  06-Apr-1966 MRN:                [id number]  BP 104/60  Pulse 74  Temp(Src) 98.9 F (37.2 C) (Oral)   VICKI PASQUAL presents for insertion of upper and lower complete dentures. Procedure: Pressure indicating paste was applied to the dentures. Adjustments were made as needed. [place] Occlusion evaluated and adjustments made as needed for Centric Relation and protrusive strokes. Good esthetics, phonetics, fit, and function noted. Patient accepts results. Post op instructions provided in written and verbal formats on use and care of dentures. Gave patient denture brush and cup. Patient to keep dentures out if sore spots develop. Use salt water rinses as needed to aid healing. Return to clinic as scheduled for denture adjustment.  Call if problems arise before then. Patient dismissed in stable condition.  Lenn Cal, DDS

## 2013-12-16 NOTE — Progress Notes (Unsigned)
Patient presented to the clinic today requesting refill of his pain medication. Patient has been referred for pain management but, two clinics have refused due to insurance reasons. Dr. Arloa Koh refilled Oxycodone IR 5 mg tablet on 12/09/13 qty of 15. Patient reports he only has two pills remaining. Reports upper lip and throat pain 10 on a scale of 0-10. Reports he was seen by his dentist today and given dentures. He reports he was only able to wear them for an hour before having to take them out because he felt like he "was going to choke to death." Hoarseness noted. Reports difficulty swallowing continues and more often he is getting choked.

## 2013-12-17 ENCOUNTER — Telehealth: Payer: Self-pay | Admitting: *Deleted

## 2013-12-17 ENCOUNTER — Other Ambulatory Visit (HOSPITAL_COMMUNITY): Payer: Self-pay | Admitting: Radiation Oncology

## 2013-12-17 DIAGNOSIS — R1310 Dysphagia, unspecified: Secondary | ICD-10-CM

## 2013-12-17 NOTE — Telephone Encounter (Signed)
Called patient to inform of liquid barium swallow for tomorrow - 12-18-13 arrival time - 9:45 am @ [facility], spoke with patient and he is aware of this test.

## 2013-12-18 ENCOUNTER — Ambulatory Visit (HOSPITAL_COMMUNITY): Payer: No Typology Code available for payment source

## 2013-12-18 ENCOUNTER — Ambulatory Visit (HOSPITAL_COMMUNITY)
Admission: RE | Admit: 2013-12-18 | Discharge: 2013-12-18 | Disposition: A | Discharge status: Home / Self Care | Payer: No Typology Code available for payment source | Source: Ambulatory Visit | Attending: Radiation Oncology | Admitting: Radiation Oncology

## 2013-12-18 DIAGNOSIS — R1319 Other dysphagia: Secondary | ICD-10-CM

## 2013-12-18 DIAGNOSIS — M5030 Other cervical disc degeneration, unspecified cervical region: Secondary | ICD-10-CM | POA: Insufficient documentation

## 2013-12-18 DIAGNOSIS — K228 Other specified diseases of esophagus: Secondary | ICD-10-CM | POA: Insufficient documentation

## 2013-12-18 DIAGNOSIS — K2289 Other specified disease of esophagus: Secondary | ICD-10-CM | POA: Insufficient documentation

## 2013-12-18 DIAGNOSIS — R1310 Dysphagia, unspecified: Secondary | ICD-10-CM

## 2013-12-19 ENCOUNTER — Ambulatory Visit: Payer: No Typology Code available for payment source | Admitting: Radiation Oncology

## 2013-12-22 ENCOUNTER — Telehealth: Payer: Self-pay | Admitting: *Deleted

## 2013-12-22 ENCOUNTER — Other Ambulatory Visit: Payer: Self-pay | Admitting: Radiation Oncology

## 2013-12-22 ENCOUNTER — Other Ambulatory Visit: Payer: Self-pay | Admitting: Gastroenterology

## 2013-12-22 ENCOUNTER — Encounter (HOSPITAL_COMMUNITY): Payer: Self-pay | Admitting: Dentistry

## 2013-12-22 DIAGNOSIS — C320 Malignant neoplasm of glottis: Secondary | ICD-10-CM

## 2013-12-22 MED ORDER — OXYCODONE HCL 5 MG PO TABS: 5.0000 mg | ORAL_TABLET | Freq: Four times a day (QID) | ORAL | Status: DC | PRN

## 2013-12-22 NOTE — Telephone Encounter (Signed)
called patient home phone, informed him that he could come pick up reill pain rx today at [facility],patient  Aid to thank Dr.Squire, and or the return call so soon  9:21 AM

## 2013-12-22 NOTE — Telephone Encounter (Signed)
Patient called and spoke with Romie Jumper requesting refil on rx Oxycodone, would like to pick up this morning,please call  8:29 AM

## 2013-12-23 ENCOUNTER — Encounter (HOSPITAL_COMMUNITY): Payer: Self-pay | Admitting: *Deleted

## 2013-12-24 ENCOUNTER — Other Ambulatory Visit: Payer: Self-pay | Admitting: Radiation Oncology

## 2013-12-24 DIAGNOSIS — F1193 Opioid use, unspecified with withdrawal: Secondary | ICD-10-CM

## 2013-12-24 DIAGNOSIS — F1123 Opioid dependence with withdrawal: Secondary | ICD-10-CM

## 2013-12-24 DIAGNOSIS — Z87898 Personal history of other specified conditions: Secondary | ICD-10-CM

## 2013-12-29 ENCOUNTER — Ambulatory Visit (HOSPITAL_COMMUNITY)
Admission: RE | Admit: 2013-12-29 | Discharge: 2013-12-29 | Disposition: A | Discharge status: Home / Self Care | Payer: No Typology Code available for payment source | Source: Ambulatory Visit | Attending: Gastroenterology | Admitting: Gastroenterology

## 2013-12-29 ENCOUNTER — Telehealth: Payer: Self-pay | Admitting: *Deleted

## 2013-12-29 ENCOUNTER — Other Ambulatory Visit (HOSPITAL_COMMUNITY): Payer: Self-pay | Admitting: Gastroenterology

## 2013-12-29 ENCOUNTER — Other Ambulatory Visit: Payer: Self-pay | Admitting: Radiation Oncology

## 2013-12-29 DIAGNOSIS — R1310 Dysphagia, unspecified: Secondary | ICD-10-CM

## 2013-12-29 DIAGNOSIS — C320 Malignant neoplasm of glottis: Secondary | ICD-10-CM

## 2013-12-29 DIAGNOSIS — Z8521 Personal history of malignant neoplasm of larynx: Secondary | ICD-10-CM | POA: Insufficient documentation

## 2013-12-29 DIAGNOSIS — J438 Other emphysema: Secondary | ICD-10-CM | POA: Insufficient documentation

## 2013-12-29 MED ORDER — OXYCODONE HCL 5 MG PO TABS: 5.0000 mg | ORAL_TABLET | Freq: Four times a day (QID) | ORAL | Status: DC | PRN

## 2013-12-29 NOTE — Anesthesia Preprocedure Evaluation (Addendum)
Anesthesia Evaluation  Patient identified by MRN, date of birth, ID band Patient awake    Reviewed: Allergy & Precautions, H&P , NPO status , Patient's Chart, lab work & pertinent test results  Airway Mallampati: II TM Distance: >3 FB Neck ROM: full    Dental  (+) Edentulous Upper, Edentulous Lower, Dental Advisory Given   Pulmonary shortness of breath and with exertion, Current Smoker,  Vocal cord cancer. Hoarseness.  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  Pulmonary exam normal       Cardiovascular Exercise Tolerance: Good negative cardio ROS  Rhythm:regular Rate:Normal     Neuro/Psych negative neurological ROS  negative psych ROS   GI/Hepatic negative GI ROS, Neg liver ROS,   Endo/Other  negative endocrine ROSHypothyroidism   Renal/GU negative Renal ROS  negative genitourinary   Musculoskeletal   Abdominal   Peds  Hematology negative hematology ROS (+)   Anesthesia Other Findings   Reproductive/Obstetrics negative OB ROS                        Anesthesia Physical Anesthesia Plan  ASA: III  Anesthesia Plan: MAC   Post-op Pain Management:    Induction:   Airway Management Planned: Simple Face Mask  Additional Equipment:   Intra-op Plan:   Post-operative Plan:   Informed Consent: I have reviewed the patients History and Physical, chart, labs and discussed the procedure including the risks, benefits and alternatives for the proposed anesthesia with the patient or authorized representative who has indicated his/her understanding and acceptance.   Dental Advisory Given  Plan Discussed with: CRNA and Surgeon  Anesthesia Plan Comments:         Anesthesia Quick Evaluation

## 2013-12-29 NOTE — Telephone Encounter (Signed)
Called patient to inform that script is ready for pick-up, spoke with patient and he is aware of this 

## 2013-12-30 ENCOUNTER — Ambulatory Visit (HOSPITAL_COMMUNITY)
Admission: RE | Admit: 2013-12-30 | Discharge: 2013-12-30 | Disposition: A | Discharge status: Home / Self Care | Payer: No Typology Code available for payment source | Source: Ambulatory Visit | Attending: Gastroenterology | Admitting: Gastroenterology

## 2013-12-30 ENCOUNTER — Encounter (HOSPITAL_COMMUNITY)
Admission: RE | Disposition: A | Discharge status: Home / Self Care | Payer: Self-pay | Source: Ambulatory Visit | Attending: Gastroenterology

## 2013-12-30 ENCOUNTER — Encounter (HOSPITAL_COMMUNITY): Payer: Self-pay | Admitting: Anesthesiology

## 2013-12-30 ENCOUNTER — Ambulatory Visit (HOSPITAL_COMMUNITY): Payer: No Typology Code available for payment source | Admitting: Anesthesiology

## 2013-12-30 ENCOUNTER — Encounter (HOSPITAL_COMMUNITY): Payer: No Typology Code available for payment source | Admitting: Anesthesiology

## 2013-12-30 DIAGNOSIS — F17200 Nicotine dependence, unspecified, uncomplicated: Secondary | ICD-10-CM | POA: Insufficient documentation

## 2013-12-30 DIAGNOSIS — R1310 Dysphagia, unspecified: Secondary | ICD-10-CM | POA: Insufficient documentation

## 2013-12-30 DIAGNOSIS — Z923 Personal history of irradiation: Secondary | ICD-10-CM | POA: Insufficient documentation

## 2013-12-30 DIAGNOSIS — E039 Hypothyroidism, unspecified: Secondary | ICD-10-CM | POA: Insufficient documentation

## 2013-12-30 DIAGNOSIS — Z8521 Personal history of malignant neoplasm of larynx: Secondary | ICD-10-CM | POA: Insufficient documentation

## 2013-12-30 HISTORY — PX: BALLOON DILATION: SHX5330

## 2013-12-30 HISTORY — PX: ESOPHAGOGASTRODUODENOSCOPY (EGD) WITH PROPOFOL: SHX5813

## 2013-12-30 SURGERY — ESOPHAGOGASTRODUODENOSCOPY (EGD) WITH PROPOFOL
Anesthesia: Monitor Anesthesia Care

## 2013-12-30 MED ORDER — LIDOCAINE HCL (CARDIAC) 20 MG/ML IV SOLN
INTRAVENOUS | Status: DC | PRN
  Administered 2013-12-30: 50 mg via INTRAVENOUS

## 2013-12-30 MED ORDER — LACTATED RINGERS IV SOLN
INTRAVENOUS | Status: DC
  Administered 2013-12-30: 1000 mL via INTRAVENOUS

## 2013-12-30 MED ORDER — SODIUM CHLORIDE 0.9 % IV SOLN: INTRAVENOUS | Status: DC

## 2013-12-30 MED ORDER — LACTATED RINGERS IV SOLN
INTRAVENOUS | Status: DC | PRN
  Administered 2013-12-30: 08:00:00 via INTRAVENOUS

## 2013-12-30 MED ORDER — PROPOFOL 10 MG/ML IV BOLUS
INTRAVENOUS | Status: AC
  Filled 2013-12-30: qty 20

## 2013-12-30 MED ORDER — LIDOCAINE HCL (CARDIAC) 20 MG/ML IV SOLN
INTRAVENOUS | Status: AC
  Filled 2013-12-30: qty 5

## 2013-12-30 MED ORDER — BUTAMBEN-TETRACAINE-BENZOCAINE 2-2-14 % EX AERO
INHALATION_SPRAY | CUTANEOUS | Status: DC | PRN
  Administered 2013-12-30: 1 via TOPICAL

## 2013-12-30 MED ORDER — PROPOFOL 10 MG/ML IV EMUL
INTRAVENOUS | Status: DC | PRN
  Administered 2013-12-30: 30 mg via INTRAVENOUS
  Administered 2013-12-30: 70 mg via INTRAVENOUS

## 2013-12-30 SURGICAL SUPPLY — 14 items

## 2013-12-30 NOTE — Discharge Instructions (Signed)

## 2013-12-30 NOTE — Anesthesia Postprocedure Evaluation (Signed)
  Anesthesia Post-op Note  Patient: [name]  Procedure(s) Performed: Procedure(s) (LRB): ESOPHAGOGASTRODUODENOSCOPY (EGD) WITH PROPOFOL (N/A) BALLOON DILATION (N/A)  Patient Location: PACU  Anesthesia Type: MAC  Level of Consciousness: awake and alert   Airway and Oxygen Therapy: Patient Spontanous Breathing  Post-op Pain: mild  Post-op Assessment: Post-op Vital signs reviewed, Patient's Cardiovascular Status Stable, Respiratory Function Stable, Patent Airway and No signs of Nausea or vomiting  Last Vitals:  Filed Vitals:   12/30/13 0840  BP: 114/81  Pulse: 60  Temp:   Resp: 16    Post-op Vital Signs: stable   Complications: No apparent anesthesia complications

## 2013-12-30 NOTE — Transfer of Care (Signed)
Immediate Anesthesia Transfer of Care Note  Patient: [name]  Procedure(s) Performed: Procedure(s): ESOPHAGOGASTRODUODENOSCOPY (EGD) WITH PROPOFOL (N/A) BALLOON DILATION (N/A)  Patient Location: PACU  Anesthesia Type:MAC  Level of Consciousness: awake and sedated  Airway & Oxygen Therapy: Patient Spontanous Breathing  Post-op Assessment: Report given to PACU RN and Post -op Vital signs reviewed and stable  Post vital signs: Reviewed and stable  Complications: No apparent anesthesia complications

## 2013-12-30 NOTE — H&P (Signed)
  Problem: Esophageal dysphagia and distal esophageal stricture by barium esophagram. 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  History: The patient is a 48 year old male born [birth year] received radiation therapy to treat squamous cell carcinoma of the larynx.  When he developed cervical esophageal dysphagia, he underwent a modified barium swallow performed by speech pathology followed by barium esophagram with tablet which showed narrowing of the cervical esophagus and esophageal narrowing suggestive of a stricture in the distal esophagus. The barium tablet lodged in the distal esophagus.  The patient is scheduled to undergo diagnostic esophagogastroduodenoscopy and esophageal stricture dilation.  Medication allergies: Fentanyl and morphine  Past medical history: Tonsillectomy. Hypothyroidism. Squamous cell laryngeal cancer.  Exam: The patient is alert and lying comfortably on the endoscopy stretcher. Abdomen is soft and nontender to palpation. Lungs are clear to auscultation. Cardiac exam reveals a regular rhythm.  Plan: Proceed with diagnostic esophagogastroduodenoscopy with esophageal stricture dilation

## 2013-12-30 NOTE — Op Note (Signed)
Problem: Esophageal dysphagia. 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  Endoscopist: Earle Gell  Premedication: Propofol administered by anesthesia  Procedure: Diagnostic esophagogastroduodenoscopy with balloon dilation at the esophagogastric junction The patient was placed in the left lateral decubitus position. The Pentax gastroscope was passed through the posterior hypopharynx into the proximal esophagus without difficulty. The vocal cords were not visualized.  Esophagoscopy: The proximal, mid, and lower segments of the esophageal mucosa appeared normal. The squamocolumnar junction was noted at 40 cm from the incisor teeth. There was no endoscopic evidence for the presence of erosive esophagitis, esophageal stricture formation, or esophageal obstruction. The esophageal balloon dilator was inflated to 18 mm in the distal esophagus unassociated with a mucosal disruption indicating the absence of a significant esophageal stricture.  Gastroscopy: Retroflex view of the gastric cardia and fundus was normal. The gastric body, antrum, and pylorus appeared normal.  Duodenoscopy: The duodenal bulb and descending duodenum appeared normal  Assessment: Normal esophagogastroduodenoscopy. No evidence for the presence of esophageal stricture formation or esophageal obstruction.  Recommendation: If esophageal dysphagia persists, schedule high-resolution esophageal manometry to rule out esophageal dysmotility (achalasia).

## 2013-12-31 ENCOUNTER — Encounter (HOSPITAL_COMMUNITY): Payer: Self-pay | Admitting: Gastroenterology

## 2014-01-02 ENCOUNTER — Ambulatory Visit
Admission: RE | Admit: 2014-01-02 | Discharge: 2014-01-02 | Disposition: A | Discharge status: Home / Self Care | Payer: No Typology Code available for payment source | Source: Ambulatory Visit | Attending: Radiation Oncology | Admitting: Radiation Oncology

## 2014-01-02 ENCOUNTER — Encounter: Payer: Self-pay | Admitting: Radiation Oncology

## 2014-01-02 VITALS — BP 118/71 | HR 65 | Temp 98.8°F | Resp 20 | Ht 71.0 in | Wt 151.7 lb

## 2014-01-02 DIAGNOSIS — C320 Malignant neoplasm of glottis: Secondary | ICD-10-CM

## 2014-01-02 MED ORDER — OXYCODONE HCL 5 MG PO TABS: ORAL_TABLET | ORAL | Status: DC

## 2014-01-02 NOTE — Progress Notes (Addendum)
Follow up larynx, had his esophagus dilated Tuesday this week doesjhave hoarseness, scratchy, can swallow better, taking pain medication , energy level, good, eating better,takes his teeth out to eat, says sinus's bad 4:00 PM

## 2014-01-02 NOTE — Progress Notes (Addendum)
Radiation Oncology         [phone] ________________________________  Name: [name] MRN: [id number]  Date: 01/02/2014  DOB: 10/09/1965  Follow-Up Visit Note  CC: [name], MD  [name], MD  Diagnosis and Prior Radiotherapy:  T1 N0 larynx cancer - STAGE I.  63 Gray in 28 fractions to larynx completed 06/27/2013    Narrative:  The patient returns today for routine follow-up.  He underwent a  Normal esophagogastroduodenoscopy this week by Dr Wynetta Emery with no evidence for the presence of esophageal stricture formation or esophageal obstruction. Recommendation: If esophageal dysphagia persists, schedule high-resolution esophageal manometry to rule out esophageal dysmotility (achalasia).    Since this procedure, patient feels swallowing has improved.  He reports he is taking the oxycodone as prescribed (5mg  BID).  He feels "Bleh" if he misses a dose. It is hard for him to work without the pain meds due to withdrawal symptoms that he has trouble articulating.  He has fairly minimal pain. Scratchy throat but no frank odynophagia. Sinuses have bothered him (maxillary) since teeth extractions.                            ALLERGIES:  is allergic to fentanyl; morphine and related; and tylenol.  Meds: Current Outpatient Prescriptions  Medication Sig Dispense Refill  . albuterol (PROVENTIL HFA;VENTOLIN HFA) 108 (90 BASE) MCG/ACT inhaler Inhale 1-2 puffs into the lungs every 6 (six) hours as needed for wheezing or shortness of breath.      . cyclobenzaprine (FLEXERIL) 10 MG tablet Take 1 tablet (10 mg total) by mouth 2 (two) times daily as needed for muscle spasms.  20 tablet  0  . ibuprofen (ADVIL,MOTRIN) 200 MG tablet Take 400 mg by mouth daily as needed for mild pain.      Marland Kitchen levothyroxine (SYNTHROID, LEVOTHROID) 100 MCG tablet Take 100 mcg by mouth daily before breakfast.      . naproxen (NAPROSYN) 500 MG tablet Take 1 tablet (500 mg total) by mouth 2 (two) times daily  with a meal.  30 tablet  0  . omeprazole (PRILOSEC) 40 MG capsule Take 40 mg by mouth at bedtime.      Marland Kitchen oxyCODONE (OXY IR/ROXICODONE) 5 MG immediate release tablet Take 1 tab up to twice a day  14 tablet  0  . sucralfate (CARAFATE) 1 G tablet Take 1 tablet (1 g total) by mouth 4 (four) times daily -  with meals and at bedtime. *Dissolves 1 tablet in 8ml of water*  40 tablet  5   No current facility-administered medications for this encounter.    Physical Findings: The patient is in no acute distress. Patient is alert and oriented.  height is 5\' 11"  (1.803 m) and weight is 151 lb 11.2 oz (68.811 kg). His oral temperature is 98.8 F (37.1 C). His blood pressure is 118/71 and his pulse is 65. His respiration is 20 and oxygen saturation is 97%. . Further physical exam deferred today.  Lab Findings: Lab Results  Component Value Date   WBC 4.3 09/25/2013   HGB 13.0 09/25/2013   HCT 38.9* 09/25/2013   MCV 95.1 09/25/2013   PLT 175 09/25/2013    Lab Results  Component Value Date   TSH 114.200* 03/17/2013    Radiographic Findings: Dg Chest 2 View  12/29/2013   CLINICAL DATA:  Dysphagia.  History of vocal cord carcinoma.  EXAM: CHEST  2 VIEW  COMPARISON:  04/22/2013  FINDINGS: Lungs are mildly hyperexpanded. There is scarring with emphysematous change in the apices. Lungs are otherwise clear. No lung mass or nodule.  Normal heart, mediastinum hila.  Bony thorax is intact.  IMPRESSION: No acute cardiopulmonary disease.  Emphysema.   Electronically Signed   By: Lajean Manes M.D.   On: 12/29/2013 16:15   Dg Esophagus  12/18/2013   CLINICAL DATA:  Feeling of fullness within the throat since radiation therapy beginning in September. Head neck primary malignancy.  EXAM: ESOPHOGRAM / BARIUM SWALLOW / BARIUM TABLET STUDY  TECHNIQUE: Combined double contrast and single contrast examination performed using effervescent crystals, thick barium liquid, and thin barium liquid. The patient was observed with  fluoroscopy swallowing a 52mm barium sulphate tablet.  FLUOROSCOPY TIME:  5 min and 1 second  COMPARISON:  05/08/2013 PET  FINDINGS: Hypo pharyngeal portion of the exam demonstrates moved mild narrowing of the contrast column at the level of the proximal esophagus. Example image 6 of series 1.  Double contrast evaluation of the esophagus demonstrates no mucosal abnormality.  Evaluation of primary peristalsis demonstrates a normal primary peristaltic wave on each swallow.  Full column evaluation of the esophagus demonstrates an area of incomplete distension at the gastroesophageal junction/ esophageal vestibule. This distends maximally on series 67. No mucosal abnormality in this area.  13 mm barium tablet has persistent delayed passage at the distal esophagus. Series 77.  IMPRESSION: 1. Area of mild underdistention at the gastroesophageal junction/esophageal vestibule. Concurrent marked delayed passage of a 13 mm barium tablet at this level. Cannot exclude mild (likely inflammatory/peptic) stricture in this area. Endoscopy should be considered. 2. Mild narrowing of the contrast column at the C5-6 level. This may represent an area of radiation induced stricture of the proximal esophagus. This would also be best evaluated with endoscopy. These results will be called to the ordering clinician or representative by the Radiologist Assistant, and communication documented in the PACS or zVision Dashboard.   Electronically Signed   By: Abigail Miyamoto M.D.   On: 12/18/2013 11:06    Impression/Plan:  The main purpose of this followup was to discuss his pain medication regimen and the esophageal studies. Any significant strictures, gross infections, mucosal abnormalities, and ulcers have been ruled out. Laryngeal/pharyngeal abnormality to cause pain have been ruled out with laryngoscopy. 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  But, now he has healed from his oncology treatment and is without evidence of disease. In the past he has taken more oxycodone than prescribed but fortunately he is now keeping within the prescription that I am giving him.  I think he would benefit from seeing an addiction specialist to ensure that he tapers off of narcotics safely durably and effectively. He is very amenable to this. I am in the process of finding a center that will except his insurance (or lack thereof) for outpatient care. I appreciate the help of social work in making this happen  1) Head and Neck Cancer Status: NED  2) Nutritional Status: No active issues   3) Risk Factors: The patient has been educated about risk factors including alcohol and tobacco abuse; they understand that avoidance of alcohol and tobacco is important to prevent recurrences as well as other cancers  4) Swallowing:Improved after esophageal studies, functional  5) Dental:  edentulous. He reports that his dentures do not fit well. He can followup with  dentistry to discuss this  6) Energy: He has hypothyroidism which has been managed by primary care, this was diagnosed before starting radiotherapy.  7)  sinus discomfort since dental extractions; I recommended he speak to otolaryngology about this   8)  Follow-up in 3 months. The patient was encouraged to call with any issues or questions before then.  I spent 25 minutes minutes face to face with the patient and more than 50% of that time was spent in counseling and/or coordination of care. _____________________________________   [name], MD

## 2014-01-05 NOTE — Progress Notes (Signed)
During post-op phone call, patient reported having a sore throat sine procedure, lasting for that last 4 days. Patient has been gargling salt water with little relief. No other symptoms reported.  Advised pt to call Dr. Durenda Age office if soreness doesn't go away by Thursday of this week. Patient verbalized understanding. [name]

## 2014-01-07 ENCOUNTER — Encounter: Payer: Self-pay | Admitting: Radiation Oncology

## 2014-01-07 NOTE — Progress Notes (Signed)
Left VM asking patient to call Gayleen Orem back to get information re: [facility].  Wilber Bihari, whom I spoke with, believes pt is eligible for their addiction services.   I asked my staff to fax paperwork [phone] attn: Wilber Bihari, director of client services   Lynnell Catalan cell is [phone] ________________________________  [name], MD

## 2014-01-08 ENCOUNTER — Telehealth: Payer: Self-pay | Admitting: *Deleted

## 2014-01-08 NOTE — Telephone Encounter (Signed)
Patient called in follow-up to VM left by Dr. Isidore Moos yesterday evening.  I provided him Lynnell Catalan phone number at [facility] and told him to contact her.  He shortly after our conversation to report he had made contact with her and expects a call back with an appt time.  He requested a refill for Oxy IR which I communicated to Dr. Isidore Moos via [place].  He reported improved swallowing of liquid and solids since esophageal dilation.  Continuing to navigate as L3 (treatments completed) patient.  Gayleen Orem, RN, BSN, [facility] Head & Neck Oncology Navigator [phone]

## 2014-01-09 ENCOUNTER — Telehealth: Payer: Self-pay | Admitting: *Deleted

## 2014-01-09 ENCOUNTER — Other Ambulatory Visit: Payer: Self-pay | Admitting: Radiation Oncology

## 2014-01-09 DIAGNOSIS — C320 Malignant neoplasm of glottis: Secondary | ICD-10-CM

## 2014-01-09 MED ORDER — OXYCODONE HCL 5 MG PO TABS: ORAL_TABLET | ORAL | Status: DC

## 2014-01-09 NOTE — Telephone Encounter (Signed)
Called patient to inform that script is ready for pick-up, spoke with his wife [name] and she is aware of this.

## 2014-01-13 ENCOUNTER — Encounter: Payer: Self-pay | Admitting: *Deleted

## 2014-01-13 NOTE — Progress Notes (Signed)
[place] Work  Clinical Social Work received referral from radiation oncologist to follow up on referral to [facility].    CSW spoke with patient- he stated he was waiting on call back from [name]- client services director at [facility].    CSW contacted Ms. [name]- she stated she is waiting for patient to return her call.  Ms. [name] stated once she has contact with patient, she will work towards connecting him with services.  CSW requested Ms. [name] notify once services are established.  CSW contacted patient by phone again stated Ms. [name] is waiting to receive his call- patient stated he would call her at this time.    [name], MSW, LCSW, OSW-C Clinical Social Worker [facility] [phone]

## 2014-01-15 ENCOUNTER — Telehealth: Payer: Self-pay | Admitting: *Deleted

## 2014-01-15 ENCOUNTER — Other Ambulatory Visit: Payer: Self-pay | Admitting: Radiation Oncology

## 2014-01-15 DIAGNOSIS — C320 Malignant neoplasm of glottis: Secondary | ICD-10-CM

## 2014-01-15 MED ORDER — OXYCODONE HCL 5 MG PO TABS: 5.0000 mg | ORAL_TABLET | Freq: Two times a day (BID) | ORAL | Status: DC

## 2014-01-16 ENCOUNTER — Telehealth: Payer: Self-pay | Admitting: *Deleted

## 2014-01-16 NOTE — Telephone Encounter (Signed)
Patient called with refill request for Oxy IR 5 mg (last filled by Dr. Isidore Moos on 6/19, 14 tab, instructions no more than 2 tabs/day), request forwarded to Dr. Valere Dross.  Patient has been referred to [facility] for addiction counseling but he reports they indicated they cannot help him b/c they have not received documentation of addiction.  Consequently, he has been referred to [facility].   Pt reports he LVM yesterday with Tod Persia requesting appt.   This note to be routed to Dr. Isidore Moos and Polo Riley, LCSW.  Gayleen Orem, RN, BSN, [facility] Head & Neck Oncology Navigator [phone]

## 2014-01-16 NOTE — Telephone Encounter (Signed)
Spoke with patient wife to inform that Rx refill available at [facility] for pick-up.  She will let patient know when he returns from work later this morning.  Gayleen Orem, RN, BSN, [facility] Head & Neck Oncology Navigator [phone]

## 2014-01-19 ENCOUNTER — Ambulatory Visit: Payer: Self-pay | Admitting: Internal Medicine

## 2014-01-21 ENCOUNTER — Other Ambulatory Visit: Payer: Self-pay | Admitting: Radiation Oncology

## 2014-01-21 DIAGNOSIS — C320 Malignant neoplasm of glottis: Secondary | ICD-10-CM

## 2014-01-21 MED ORDER — OXYCODONE HCL 5 MG PO TABS: ORAL_TABLET | ORAL | Status: DC

## 2014-01-28 ENCOUNTER — Telehealth: Payer: Self-pay | Admitting: *Deleted

## 2014-01-28 ENCOUNTER — Encounter: Payer: Self-pay | Admitting: *Deleted

## 2014-01-28 ENCOUNTER — Other Ambulatory Visit: Payer: Self-pay | Admitting: Radiation Oncology

## 2014-01-28 DIAGNOSIS — C320 Malignant neoplasm of glottis: Secondary | ICD-10-CM

## 2014-01-28 MED ORDER — OXYCODONE HCL 5 MG PO TABS: ORAL_TABLET | ORAL | Status: DC

## 2014-01-28 NOTE — Progress Notes (Signed)
[place] Work  Clinical Social Work attempted to contact patient by phone to follow up on [facility]/[employer] counseling substance counseling.  Patient's spouse stated patient was not home, she is unsure if patient has made follow up with Althea Charon- she stated she was recently discharged from hospital and is currently dealing with her own mental health issues.  CSW requested patient contact CSW as soon as possible.  Polo Riley, MSW, LCSW, OSW-C Clinical Social Worker [facility] [phone]

## 2014-01-28 NOTE — Telephone Encounter (Signed)
Patient called with script refill request for Oxy IR 5 mg.  I notified Dr. Isidore Moos who issued Rx, I called pt to let him know that it is available for pick-up, he verbalized understanding.  Pt stated he has tried multiple times to contact [employer] but he does not receive response to his VM.  I called [facility], LVM with request for them to call me to explain referral/patient contact process.    Gayleen Orem, RN, BSN, [facility] Head & Neck Oncology Navigator [phone]

## 2014-02-04 ENCOUNTER — Other Ambulatory Visit: Payer: Self-pay | Admitting: Radiation Oncology

## 2014-02-04 DIAGNOSIS — C320 Malignant neoplasm of glottis: Secondary | ICD-10-CM

## 2014-02-04 MED ORDER — OXYCODONE HCL 5 MG PO TABS: ORAL_TABLET | ORAL | Status: DC

## 2014-02-05 ENCOUNTER — Telehealth: Payer: Self-pay | Admitting: *Deleted

## 2014-02-05 NOTE — Telephone Encounter (Signed)
Patient called yesterday with request for Oxy IR refill, I notified Dr. Isidore Moos via [place].  I called patient this morning to let him know Rx available for pick-up at RadOnc nurse's station.  He verbalized understanding.  Gayleen Orem, RN, BSN, [facility] Head & Neck Oncology Navigator [phone]

## 2014-02-11 ENCOUNTER — Telehealth: Payer: Self-pay | Admitting: *Deleted

## 2014-02-11 ENCOUNTER — Other Ambulatory Visit: Payer: Self-pay | Admitting: Radiation Oncology

## 2014-02-11 DIAGNOSIS — C320 Malignant neoplasm of glottis: Secondary | ICD-10-CM

## 2014-02-11 MED ORDER — OXYCODONE HCL 5 MG PO TABS: ORAL_TABLET | ORAL | Status: DC

## 2014-02-11 NOTE — Telephone Encounter (Signed)
Pt called with request for Oxy IR 5 mg refill for pick-up today rather than tomorrow.. Confirmed with Dr. Isidore Moos in [place] that Rx could be reissued, Dr. Sondra Come wrote Rx, I notified pt of availability for pick-up at [place] station.  He verbalized understanding.  Gayleen Orem, RN, BSN, [facility] Head & Neck Oncology Navigator [phone]

## 2014-02-19 ENCOUNTER — Other Ambulatory Visit: Payer: Self-pay | Admitting: Radiation Oncology

## 2014-02-19 DIAGNOSIS — C320 Malignant neoplasm of glottis: Secondary | ICD-10-CM

## 2014-02-19 MED ORDER — OXYCODONE HCL 5 MG PO TABS: 5.0000 mg | ORAL_TABLET | Freq: Two times a day (BID) | ORAL | Status: DC | PRN

## 2014-02-25 ENCOUNTER — Telehealth: Payer: Self-pay | Admitting: *Deleted

## 2014-02-25 ENCOUNTER — Other Ambulatory Visit: Payer: Self-pay | Admitting: Radiation Oncology

## 2014-02-25 DIAGNOSIS — C320 Malignant neoplasm of glottis: Secondary | ICD-10-CM

## 2014-02-25 MED ORDER — OXYCODONE HCL 5 MG PO TABS: ORAL_TABLET | ORAL | Status: DC

## 2014-02-25 NOTE — Telephone Encounter (Signed)
In response to my VM this morning, received call from [facility], with [employer] Counseling 'Intake'.  I shared that the patient says he has called repeatedly over the past months but has had no response to his VMs; she stated that thy have no records for him.  At her request, I provided his CP #, she stated she would call him.  I called [place] after this call to let him know that I had this conversation and that he could expect to hear from her.  I asked him to keep me informed of his progress with this agency, he verbalized agreement.  Gayleen Orem, RN, BSN, [facility] Head & Neck Oncology Navigator [phone]

## 2014-02-25 NOTE — Telephone Encounter (Signed)
Received VM from patient with request for refill of oxyCODONE (OXY IR/ROXICODONE) 5 MG immediate release tablet.  I forwarded request to Dr. Isidore Moos, called him @ ca. 1352 to inform script available for pick-up.  Gayleen Orem, RN, BSN, [facility] Head & Neck Oncology Navigator [phone]

## 2014-03-03 ENCOUNTER — Telehealth: Payer: Self-pay | Admitting: *Deleted

## 2014-03-04 ENCOUNTER — Other Ambulatory Visit: Payer: Self-pay | Admitting: Radiation Oncology

## 2014-03-04 ENCOUNTER — Telehealth: Payer: Self-pay | Admitting: *Deleted

## 2014-03-04 DIAGNOSIS — C320 Malignant neoplasm of glottis: Secondary | ICD-10-CM

## 2014-03-04 MED ORDER — OXYCODONE HCL 5 MG PO TABS: ORAL_TABLET | ORAL | Status: DC

## 2014-03-04 NOTE — Telephone Encounter (Signed)
[name] called requesting refill for Oxy IR, wd like to pick up Thursday.  Stated he heard back from [employer] and was told they wd not accept his insurance (he has [employer]) but wd accept him if he has Medicaid.  I will talk with him further when he comes in to get his Rx.  Gayleen Orem, RN, BSN, [place] at [place] [phone]

## 2014-03-04 NOTE — Telephone Encounter (Signed)
Pt called to follow-up on Rx request.  I stated it had been filled and it is available to be picked up tomorrow.  He further explained that he was told by [facility] that they could assist him or refer him if he has Medicaid.  [name] said he has completed Medicaid paperwork and is waiting for their response.  Gayleen Orem, RN, BSN, [place] at [place] [phone]

## 2014-03-10 ENCOUNTER — Telehealth: Payer: Self-pay | Admitting: *Deleted

## 2014-03-12 ENCOUNTER — Other Ambulatory Visit: Payer: Self-pay | Admitting: Radiation Oncology

## 2014-03-12 DIAGNOSIS — C320 Malignant neoplasm of glottis: Secondary | ICD-10-CM

## 2014-03-12 MED ORDER — OXYCODONE HCL 5 MG PO TABS
5.0000 mg | ORAL_TABLET | Freq: Two times a day (BID) | ORAL | Status: DC | PRN
Start: 2014-03-12 — End: 2014-03-18

## 2014-03-12 NOTE — Telephone Encounter (Signed)
[place] called with weekly request for oxyCODONE 5 mg refill which in Dr. Pearlie Oyster absence I will forward to to Dr. Valere Dross.  He reported:  1)  he is back to his baseline with oral intake but isn't gaining back any of the weight he lost during tmt.   2)  continuing fatigue.  3)  during his last visit with his PCP, levothyroxine was increased from 50 to 100 mcg.  His next appt with this PCP is 03/27/14.  Gayleen Orem, RN, BSN, [place] at [place] [phone]

## 2014-03-17 ENCOUNTER — Telehealth: Payer: Self-pay | Admitting: *Deleted

## 2014-03-18 ENCOUNTER — Other Ambulatory Visit: Payer: Self-pay | Admitting: Radiation Oncology

## 2014-03-18 ENCOUNTER — Ambulatory Visit
Admission: RE | Admit: 2014-03-18 | Discharge: 2014-03-18 | Disposition: A | Discharge status: Home / Self Care | Payer: No Typology Code available for payment source | Source: Ambulatory Visit | Attending: Radiation Oncology | Admitting: Radiation Oncology

## 2014-03-18 ENCOUNTER — Telehealth: Payer: Self-pay | Admitting: *Deleted

## 2014-03-18 ENCOUNTER — Encounter: Payer: Self-pay | Admitting: *Deleted

## 2014-03-18 DIAGNOSIS — E039 Hypothyroidism, unspecified: Secondary | ICD-10-CM

## 2014-03-18 DIAGNOSIS — R5383 Other fatigue: Secondary | ICD-10-CM

## 2014-03-18 DIAGNOSIS — R5381 Other malaise: Secondary | ICD-10-CM

## 2014-03-18 DIAGNOSIS — C320 Malignant neoplasm of glottis: Secondary | ICD-10-CM

## 2014-03-18 LAB — COMPREHENSIVE METABOLIC PANEL (CC13)
ALT: 10 U/L (ref 0–55)
ANION GAP: 8 meq/L (ref 3–11)
AST: 16 U/L (ref 5–34)
Albumin: 4.1 g/dL (ref 3.5–5.0)
Alkaline Phosphatase: 46 U/L (ref 40–150)
BILIRUBIN TOTAL: 0.6 mg/dL (ref 0.20–1.20)
BUN: 25.7 mg/dL (ref 7.0–26.0)
CO2: 25 meq/L (ref 22–29)
Calcium: 9 mg/dL (ref 8.4–10.4)
Chloride: 109 mEq/L (ref 98–109)
Creatinine: 1.1 mg/dL (ref 0.7–1.3)
Glucose: 150 mg/dl — ABNORMAL HIGH (ref 70–140)
Potassium: 3.9 mEq/L (ref 3.5–5.1)
Sodium: 142 mEq/L (ref 136–145)
TOTAL PROTEIN: 6.6 g/dL (ref 6.4–8.3)

## 2014-03-18 LAB — CBC WITH DIFFERENTIAL/PLATELET
BASO%: 0.6 % (ref 0.0–2.0)
Basophils Absolute: 0 10*3/uL (ref 0.0–0.1)
EOS ABS: 0.1 10*3/uL (ref 0.0–0.5)
EOS%: 2.4 % (ref 0.0–7.0)
HCT: 37.7 % — ABNORMAL LOW (ref 38.4–49.9)
HGB: 12.3 g/dL — ABNORMAL LOW (ref 13.0–17.1)
LYMPH%: 28.7 % (ref 14.0–49.0)
MCH: 31.7 pg (ref 27.2–33.4)
MCHC: 32.6 g/dL (ref 32.0–36.0)
MCV: 97.1 fL (ref 79.3–98.0)
MONO#: 0.3 10*3/uL (ref 0.1–0.9)
MONO%: 6.9 % (ref 0.0–14.0)
NEUT%: 61.4 % (ref 39.0–75.0)
NEUTROS ABS: 2.7 10*3/uL (ref 1.5–6.5)
Platelets: 202 10*3/uL (ref 140–400)
RBC: 3.89 10*6/uL — AB (ref 4.20–5.82)
RDW: 14.7 % — ABNORMAL HIGH (ref 11.0–14.6)
WBC: 4.5 10*3/uL (ref 4.0–10.3)
lymph#: 1.3 10*3/uL (ref 0.9–3.3)

## 2014-03-18 LAB — TSH CHCC: TSH: 39.398 m[IU]/L — AB (ref 0.320–4.118)

## 2014-03-18 LAB — T4, FREE: Free T4: 0.5 ng/dL — ABNORMAL LOW (ref 0.80–1.80)

## 2014-03-18 LAB — VITAMIN B12: Vitamin B-12: 338 pg/mL (ref 211–911)

## 2014-03-18 MED ORDER — OXYCODONE HCL 5 MG PO TABS: ORAL_TABLET | ORAL | Status: DC

## 2014-03-18 NOTE — Telephone Encounter (Signed)
Called [name] to inform that Rx for Oxy IR available for pick-up today.  Informed him that Dr. Isidore Moos would like to 1) arrange lab draw to check his thyroid function this afternoon when he arrives to collect Rx, and 2) schedule an appt with him this Friday.  He agreed to lab appt today at 1:45 and follow-up appt Friday at 3:00.  Gayleen Orem, RN, BSN, [place] at [place] [phone]

## 2014-03-18 NOTE — Telephone Encounter (Signed)
Patient called requesting Rx for oxycodone 5 mg IR.  Request forwarded to Dr. Isidore Moos.  Gayleen Orem, RN, BSN, [place] at [place] [phone]

## 2014-03-19 NOTE — Progress Notes (Signed)
Met with patient when he arrived to collect Rx after labs.  He reported understanding of Friday's 3:00 appt with Dr. Isidore Moos per our telephone conversation earlier today.  Gayleen Orem, RN, BSN, [place] at [place] [phone]

## 2014-03-20 ENCOUNTER — Ambulatory Visit
Admission: RE | Admit: 2014-03-20 | Discharge: 2014-03-20 | Disposition: A | Discharge status: Home / Self Care | Payer: No Typology Code available for payment source | Source: Ambulatory Visit | Attending: Radiation Oncology | Admitting: Radiation Oncology

## 2014-03-20 NOTE — Progress Notes (Signed)
NO SHOW  Asked staff to reschedule.

## 2014-03-24 NOTE — Progress Notes (Signed)
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     63 Gray in 28 fractions to larynx completed 06/27/2013   Narrative:  The patient returns today for routine follow-up. He has lost 14 lb since June despite eating 4-5 times per day and snacking. Denies difficulty swallowing so long as he cut his food up small and chews well. Hoarseness noted. Continues to take oxycodone 5 mg bid. Requesting refill of this today. Reports minimal pain today. Reports he can only wear his dentures for a few hours before having to remove them because him to gag. Reports a scratchy throat but, no frank odynophagia. Vitals stable.  Overall, main complaints:  Tired, hard to complete work functions at [employer], low stamina, cold intolerance, weight loss, maxillary sensitivity, could not enroll in narcotic counseling due to lack of insurance.                             ALLERGIES:  is allergic to fentanyl; morphine and related; and tylenol.  Meds: Current Outpatient Prescriptions  Medication Sig Dispense Refill  . albuterol (PROVENTIL HFA;VENTOLIN HFA) 108 (90 BASE) MCG/ACT inhaler Inhale 1-2 puffs into the lungs every 6 (six) hours as needed for wheezing or shortness of breath.      . levothyroxine (SYNTHROID, LEVOTHROID) 125 MCG tablet Take 1 tablet (125 mcg total) by mouth daily before breakfast.  30 tablet  3  . omeprazole (PRILOSEC) 40 MG capsule Take 40 mg by mouth at bedtime.      Marland Kitchen oxyCODONE (OXY IR/ROXICODONE) 5 MG immediate release tablet Take 1 tablet up to two times daily, as needed for pain or withdrawal symptoms. Do not exceed 2 tablets per day.  14 tablet  0  . cyclobenzaprine (FLEXERIL) 10 MG tablet Take 1 tablet (10 mg total) by mouth 2 (two) times daily as needed  for muscle spasms.  20 tablet  0  . ibuprofen (ADVIL,MOTRIN) 200 MG tablet Take 400 mg by mouth daily as needed for mild pain.      . naproxen (NAPROSYN) 500 MG tablet Take 1 tablet (500 mg total) by mouth 2 (two) times daily with a meal.  30 tablet  0  . sucralfate (CARAFATE) 1 G tablet Take 1 tablet (1 g total) by mouth 4 (four) times daily -  with meals and at bedtime. *Dissolves 1 tablet in 87ml of water*  40 tablet  5   No current facility-administered medications for this encounter.    Physical Findings: The patient is in no acute distress. Patient is alert and oriented.  weight is 137 lb (62.143 kg). His oral temperature is 98.2 F (36.8 C). His blood pressure is 107/68 and his pulse is 95. His respiration is 20 and oxygen saturation is 100%. .  Oropharyngeal mucosa is intact with no thrush or lesions. No palpable cervical or supraclavicular lymphadenopathy. Skin intact and smooth over neck.     Lab Findings: Lab Results  Component Value Date   WBC 4.5 03/18/2014   HGB 12.3* 03/18/2014   HCT 37.7* 03/18/2014   MCV 97.1 03/18/2014   PLT 202 03/18/2014   CMP     Component Value Date/Time   NA  142 03/18/2014 1406   NA 138 03/17/2013 1324   K 3.9 03/18/2014 1406   K 4.0 03/17/2013 1324   CL 103 03/17/2013 1324   CO2 25 03/18/2014 1406   CO2 27 03/17/2013 1324   GLUCOSE 150* 03/18/2014 1406   GLUCOSE 89 03/17/2013 1324   BUN 25.7 03/18/2014 1406   BUN 16 03/17/2013 1324   CREATININE 1.1 03/18/2014 1406   CREATININE 1.27 03/17/2013 1324   CALCIUM 9.0 03/18/2014 1406   CALCIUM 9.3 03/17/2013 1324   PROT 6.6 03/18/2014 1406   PROT 7.3 03/17/2013 1324   ALBUMIN 4.1 03/18/2014 1406   ALBUMIN 4.5 03/17/2013 1324   AST 16 03/18/2014 1406   AST 18 03/17/2013 1324   ALT 10 03/18/2014 1406   ALT 16 03/17/2013 1324   ALKPHOS 46 03/18/2014 1406   ALKPHOS 45 03/17/2013 1324   BILITOT 0.60 03/18/2014 1406   BILITOT 0.6 03/17/2013 1324   GFRNONAA 66 03/17/2013 1324   GFRAA 77 03/17/2013 1324      Lab  Results  Component Value Date   TSH 39.398* 03/18/2014  FREE T4  0.50  Radiographic Findings: No results found.  Impression/Plan:   Patient has been lost to f/u with PCP who was managing his levothyroxine since before RT. TSH was >100 before RT.  It is now lower, but still 39.  He has been taking Levothyroxine 141mcg correctly, on empty stomach, daily.  But I suspect some of his malaise (and labwork) is due to undertreatment. Will raise dose to 163mcg.  [facility] appts with a PCP.  F/u with ENT. Pt will call for appt due to maxillary sensitivity and sinus issues.   Refill for oxycodone given. I will see if we can do anything else to help him get into a narcotic addiction outpatient counseling program.  I have been trying for months. He has a history, months ago, of buying narcotics "off the street" due to difficulty coping without daily oxycodone.  I will see if this is sufficient evidence of addiction/abuse to allow compassionate enrollment in a program in spite of lack of insurance.  F/u in 1 mo with thyroid labs. _____________________________________   [name], MD

## 2014-03-25 ENCOUNTER — Ambulatory Visit
Admission: RE | Admit: 2014-03-25 | Discharge: 2014-03-25 | Disposition: A | Discharge status: Home / Self Care | Payer: No Typology Code available for payment source | Source: Ambulatory Visit | Attending: Radiation Oncology | Admitting: Radiation Oncology

## 2014-03-25 ENCOUNTER — Other Ambulatory Visit: Payer: Self-pay | Admitting: Radiation Oncology

## 2014-03-25 ENCOUNTER — Encounter: Payer: Self-pay | Admitting: Radiation Oncology

## 2014-03-25 VITALS — BP 107/68 | HR 95 | Temp 98.2°F | Resp 20 | Wt 137.0 lb

## 2014-03-25 DIAGNOSIS — E039 Hypothyroidism, unspecified: Secondary | ICD-10-CM

## 2014-03-25 DIAGNOSIS — C320 Malignant neoplasm of glottis: Secondary | ICD-10-CM

## 2014-03-25 MED ORDER — LEVOTHYROXINE SODIUM 125 MCG PO TABS: 125.0000 ug | ORAL_TABLET | Freq: Every day | ORAL | Status: DC

## 2014-03-25 MED ORDER — OXYCODONE HCL 5 MG PO TABS: ORAL_TABLET | ORAL | Status: DC

## 2014-03-25 NOTE — Progress Notes (Signed)
Patient has lost 14 lb since June despite eating 4-5 times per day and snacking. Denies difficulty swallowing so long as he cut his food up small and chews well. Hoarseness noted. Continues to take oxycodone 5 mg bid. Requesting refill of this today. Reports minimal pain today. Reports he can only wear his dentures for a few hours before having to remove them because him to gag. Reports a scratchy throat but, no frank odynophagia. Vitals stable.

## 2014-03-26 ENCOUNTER — Telehealth: Payer: Self-pay | Admitting: *Deleted

## 2014-03-26 NOTE — Telephone Encounter (Signed)
Called patient to inform of appt. For lab on 05-01-14 @ 1:45 pm prior to follow-up @ 2 pm on 05-01-14, no answer, will call later.

## 2014-03-26 NOTE — Telephone Encounter (Signed)
Rec'd call from patient indicating he had a message to call [place].  I determined that the call was re: a lab appt @ 1:45 lab appt prior to a 2:00 appt on 05/01/14 with Dr. Isidore Moos, provided patient this information, he verbalized understanding.  He stated that he mailed today additional paperwork to Social Security that was completed by Dr. Isidore Moos during his appt yesterday.  Gayleen Orem, RN, BSN, [place] at [place] [phone]

## 2014-03-31 ENCOUNTER — Telehealth: Payer: Self-pay | Admitting: *Deleted

## 2014-04-01 ENCOUNTER — Telehealth: Payer: Self-pay | Admitting: *Deleted

## 2014-04-01 ENCOUNTER — Other Ambulatory Visit: Payer: Self-pay | Admitting: Radiation Oncology

## 2014-04-01 DIAGNOSIS — C320 Malignant neoplasm of glottis: Secondary | ICD-10-CM

## 2014-04-01 MED ORDER — OXYCODONE HCL 5 MG PO TABS: ORAL_TABLET | ORAL | Status: DC

## 2014-04-01 NOTE — Telephone Encounter (Signed)
Called patient to inform that Rx for Oxycodone available for pick up at [place] station.  Gayleen Orem, RN, BSN, [place] at [place] [phone]

## 2014-04-01 NOTE — Telephone Encounter (Signed)
Patient LVM requesting Rx for oxyCODONE (OXY IR/ROXICODONE) 5 MG immediate release tablet.  Dr. Isidore Moos informed.  Gayleen Orem, RN, BSN, [place] at [place] [phone]

## 2014-04-06 ENCOUNTER — Encounter: Payer: Self-pay | Admitting: Radiation Oncology

## 2014-04-06 NOTE — Progress Notes (Signed)
Today I was able to speak with Wilber Bihari again of [facility]. Although Mr Houseworth initially was not eligible due to lack of documentation of addictive behaviors, based on our conversation regarding his history, she believes pt is indeed eligible for their addiction services.   Per Jackie's recommendation, I told Mr Faucett to call Zollie Beckers  - Jackie's supervisor, at [phone]; [phone].  He may then arrange a meeting with her for further assessment.  I will ask Gayleen Orem, RN, our Head and Neck Oncology Navigator to followup on his status.   -----------------------------------  [name], MD

## 2014-04-08 ENCOUNTER — Telehealth: Payer: Self-pay | Admitting: *Deleted

## 2014-04-08 ENCOUNTER — Other Ambulatory Visit: Payer: Self-pay | Admitting: Radiation Oncology

## 2014-04-08 DIAGNOSIS — C320 Malignant neoplasm of glottis: Secondary | ICD-10-CM

## 2014-04-08 MED ORDER — OXYCODONE HCL 5 MG PO TABS: ORAL_TABLET | ORAL | Status: DC

## 2014-04-08 NOTE — Telephone Encounter (Signed)
Called [place] to let him know Rx available for pick-up.  I asked him if he had scheduled appt with Zollie Beckers with [facility] and with his PCP.  He responded he was going to call them today.  I requested that he let me know when the appts are made, he stated he would call me.  Gayleen Orem, RN, BSN, [place] at [place] [phone]

## 2014-04-08 NOTE — Telephone Encounter (Signed)
Patient called with Rx request for oxyCODONE (OXY IR/ROXICODONE) 5 MG immediate release tablet.  Dr. Isidore Moos notified.  Gayleen Orem, RN, BSN, [place] at [place] [phone]

## 2014-04-14 ENCOUNTER — Telehealth: Payer: Self-pay | Admitting: *Deleted

## 2014-04-14 NOTE — Telephone Encounter (Signed)
LVM with Zollie Beckers at [facility] confirming patient's work phone #.  She will return his call to schedule an appt.  Gayleen Orem, RN, BSN, [place] at [place] [phone]

## 2014-04-14 NOTE — Telephone Encounter (Addendum)
Called patient re: his scheduling of appts with [facility] and his PCP.  Unable to leave VM on mobile.  Reached him at work #.  He questioned why these appts were necessary; I again provided explanation per our 9/16 conversation. He said he would call this morning to make appts and to wd call me with dates/times.  Gayleen Orem, RN, BSN, [place] at [place] [phone]

## 2014-04-15 ENCOUNTER — Telehealth: Payer: Self-pay | Admitting: *Deleted

## 2014-04-15 ENCOUNTER — Encounter: Payer: Self-pay | Admitting: Radiation Oncology

## 2014-04-15 DIAGNOSIS — C320 Malignant neoplasm of glottis: Secondary | ICD-10-CM

## 2014-04-15 MED ORDER — OXYCODONE HCL 5 MG PO TABS: ORAL_TABLET | ORAL | Status: DC

## 2014-04-15 NOTE — Telephone Encounter (Signed)
Spoke with patient to inform Rx ready to be picked up.  He stated he has not yet scheduled appt with PCP.  I emphasized importance of f/u with him; he agreed to call me as soon as appt arranged.  Gayleen Orem, RN, BSN, [place] at [place] [phone]

## 2014-04-15 NOTE — Telephone Encounter (Signed)
Patient called to report that he has an appt on 04/22/14 @ 1:00 pm at [facility].  He also requested an Rx for oxyCODONE (OXY IR/ROXICODONE) 5 MG immediate release tablet.  Dr. Isidore Moos informed.  Gayleen Orem, RN, BSN, [place] at [place] [phone]

## 2014-04-17 ENCOUNTER — Encounter: Payer: Self-pay | Admitting: *Deleted

## 2014-04-17 ENCOUNTER — Ambulatory Visit: Payer: No Typology Code available for payment source | Admitting: Radiation Oncology

## 2014-04-17 NOTE — Progress Notes (Signed)
Per Merideth [place] request, I faxed supporting information for her 04/22/14 appt with patient:  Dr. Pearlie Oyster letter of 04/15/14 and Dr. Pearlie Oyster Progress notes of 01/02/14, 11/21/13 and 09/26/13.  Fax confirmation received.  Gayleen Orem, RN, BSN, [place] at [place] [phone]

## 2014-04-22 ENCOUNTER — Other Ambulatory Visit: Payer: Self-pay | Admitting: Radiation Oncology

## 2014-04-22 ENCOUNTER — Telehealth: Payer: Self-pay | Admitting: *Deleted

## 2014-04-22 DIAGNOSIS — C320 Malignant neoplasm of glottis: Secondary | ICD-10-CM

## 2014-04-22 MED ORDER — OXYCODONE HCL 5 MG PO TABS: ORAL_TABLET | ORAL | Status: DC

## 2014-04-22 NOTE — Telephone Encounter (Signed)
Patient returned my call.  He confirmed that he has an appt this afternoon at [facility].  He stated he has appt at [facility] tomorrow to renew his Yellow Card; it is his understanding he will be able to make an appt with his PCP, Dr. Doreene Burke, at that time.  Gayleen Orem, RN, BSN, [place] at [place] [phone]

## 2014-04-22 NOTE — Telephone Encounter (Signed)
Called patient to inform Rx is available for pick-up in [place].  He verbalized understanding.  Gayleen Orem, RN, BSN, [place] at [place] [phone]

## 2014-04-22 NOTE — Telephone Encounter (Signed)
Rec'd VM from patient with request for oxyCODONE (OXY IR/ROXICODONE) 5 MG immediate release tablet Rx.  Returned patient's call, LVM, requesting return call with information about appt with PCP and reminder of his appt with Zollie Beckers, [facility], today.  Gayleen Orem, RN, BSN, [place] at [place] [phone]

## 2014-04-29 ENCOUNTER — Ambulatory Visit: Payer: No Typology Code available for payment source | Attending: Radiation Oncology

## 2014-04-29 ENCOUNTER — Other Ambulatory Visit: Payer: Self-pay | Admitting: Radiation Oncology

## 2014-04-29 ENCOUNTER — Telehealth: Payer: Self-pay | Admitting: *Deleted

## 2014-04-29 DIAGNOSIS — C320 Malignant neoplasm of glottis: Secondary | ICD-10-CM

## 2014-04-29 MED ORDER — OXYCODONE HCL 5 MG PO TABS: ORAL_TABLET | ORAL | Status: DC

## 2014-04-29 NOTE — Telephone Encounter (Signed)
I returned patient's VM, acknowledge his request for Oxy IR Rx, listened to his expressed concerns about his first group meeting at [place] yesterday.  I notified Dr. Isidore Moos of Rx request, told [place] I would follow-up with Zollie Beckers at [facility].  I later called him to inform that Rx available for for pick-up tomorrow.  Gayleen Orem, RN, BSN, [place] at [place] [phone]

## 2014-04-30 ENCOUNTER — Ambulatory Visit: Payer: Self-pay | Admitting: Internal Medicine

## 2014-04-30 ENCOUNTER — Telehealth: Payer: Self-pay | Admitting: *Deleted

## 2014-05-01 ENCOUNTER — Ambulatory Visit
Admission: RE | Admit: 2014-05-01 | Discharge: 2014-05-01 | Disposition: A | Discharge status: Home / Self Care | Payer: No Typology Code available for payment source | Source: Ambulatory Visit | Attending: Radiation Oncology | Admitting: Radiation Oncology

## 2014-05-01 ENCOUNTER — Ambulatory Visit: Payer: No Typology Code available for payment source

## 2014-05-01 ENCOUNTER — Encounter: Payer: Self-pay | Admitting: Radiation Oncology

## 2014-05-01 ENCOUNTER — Encounter: Payer: Self-pay | Admitting: *Deleted

## 2014-05-01 VITALS — BP 101/63 | HR 101 | Temp 98.1°F | Ht 71.0 in | Wt 136.5 lb

## 2014-05-01 DIAGNOSIS — Z510 Encounter for antineoplastic radiation therapy: Secondary | ICD-10-CM | POA: Insufficient documentation

## 2014-05-01 DIAGNOSIS — E039 Hypothyroidism, unspecified: Secondary | ICD-10-CM

## 2014-05-01 DIAGNOSIS — F1920 Other psychoactive substance dependence, uncomplicated: Secondary | ICD-10-CM | POA: Insufficient documentation

## 2014-05-01 DIAGNOSIS — C320 Malignant neoplasm of glottis: Secondary | ICD-10-CM

## 2014-05-01 DIAGNOSIS — Z716 Tobacco abuse counseling: Secondary | ICD-10-CM | POA: Insufficient documentation

## 2014-05-01 DIAGNOSIS — J029 Acute pharyngitis, unspecified: Secondary | ICD-10-CM | POA: Insufficient documentation

## 2014-05-01 DIAGNOSIS — B370 Candidal stomatitis: Secondary | ICD-10-CM | POA: Insufficient documentation

## 2014-05-01 DIAGNOSIS — Z8521 Personal history of malignant neoplasm of larynx: Secondary | ICD-10-CM | POA: Insufficient documentation

## 2014-05-01 LAB — TSH CHCC: TSH: 3.727 m(IU)/L (ref 0.320–4.118)

## 2014-05-01 MED ORDER — FLUCONAZOLE 100 MG PO TABS: ORAL_TABLET | ORAL | Status: DC

## 2014-05-01 NOTE — Progress Notes (Signed)
[place] Psychosocial Distress Screening Clinical Social Work  Clinical Social Work was referred by distress screening protocol.  The patient scored a 7 on the Psychosocial Distress Thermometer which indicates moderate distress. Clinical Social Worker reviewed chart to assess for distress and other psychosocial needs. Per chart review, distress currently listed only as pain and medical team addressed this issue appropriately. No other needs identified.   ONCBCN DISTRESS SCREENING 05/01/2014  Mark the number that describes how much distress you have been experiencing in the past week 7  Physical Problem type Pain  Physician notified of physical symptoms Yes    Clinical Social Worker follow up needed: No.  If yes, follow up plan:  Loren Racer, [place]  [facility] Phone: [phone] Fax: [phone]

## 2014-05-01 NOTE — Progress Notes (Addendum)
Mr. Stankowski here for reassessment s/p radiation therapy to his laynx which completed on 06/27/2013.  He continues to c/o sore throat as a level 7/10 and continues on Oxycodone.  He is eating a regular diet, but has to chop up meats to facilitate swallowing.  Maintaning weight.  Oral mucosa without any irritation, pink and moist in appearance.
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
## 2014-05-03 ENCOUNTER — Encounter: Payer: Self-pay | Admitting: Radiation Oncology

## 2014-05-04 ENCOUNTER — Telehealth: Payer: Self-pay | Admitting: *Deleted

## 2014-05-04 NOTE — Telephone Encounter (Signed)
PCP?

## 2014-05-04 NOTE — Telephone Encounter (Signed)
Spoke with [name] at [facility] re: patient's concerns about attending group sessions.  She indicated there is no record of his attendance at the 5:15-6:30 Tuesday afternoon session.  I informed Dr. Isidore Moos.  Gayleen Orem, RN, BSN, [place] at [place] [phone]

## 2014-05-04 NOTE — Telephone Encounter (Signed)
Called patient in follow-up to his appt with Dr. Isidore Moos last Friday.  LVM requesting he call me.  Gayleen Orem, RN, BSN, [place] at [place] [phone]

## 2014-05-11 ENCOUNTER — Telehealth: Payer: Self-pay | Admitting: *Deleted

## 2014-05-11 NOTE — Telephone Encounter (Signed)
Called patient re: 1) missed f/u appt with Dr. Wilburn Cornelia, 2) re: referral for a new PCP, 3) update on thyroid level.  LVM with request to return my call.  Gayleen Orem, RN, BSN, [place] at [place] [phone]

## 2014-05-12 ENCOUNTER — Telehealth: Payer: Self-pay | Admitting: *Deleted

## 2014-05-12 NOTE — Telephone Encounter (Signed)
Patient returned my VM.  I indicated: 1)  Per Dr. Isidore Moos, TSH is normal, he should continue on current 125 mcg dosage. 2)  I am referring him to [facility] for a new PCP. 3)  Encouraged him reschedule a recently missed f/u appt with his ENT, Dr. Wilburn Cornelia.  I emphasized the importance of f/us with providers s/p tmt completion. He verbalized understanding of information provided.  Gayleen Orem, RN, BSN, [place] at [place] [phone]

## 2014-05-27 ENCOUNTER — Telehealth: Payer: Self-pay | Admitting: *Deleted

## 2014-05-29 ENCOUNTER — Telehealth: Payer: Self-pay | Admitting: *Deleted

## 2014-05-29 NOTE — Telephone Encounter (Signed)
Entry error

## 2014-05-29 NOTE — Telephone Encounter (Signed)
Talked with patient in f/u to his VM in which he indicated he has been "feeling bad".  I asked him he had received a call from [place] for an appt, he stated he had not.  I provided him a contact # and encouraged him to call. He stated he has not scheduled a f/u appt with Dr. Wilburn Cornelia b/c his [place] has expired; he is going to [place] to renew.  I asked him to call me with an update; he stated he would.  Gayleen Orem, RN, BSN, [place] [facility] [place] [phone]'

## 2014-06-12 ENCOUNTER — Ambulatory Visit (INDEPENDENT_AMBULATORY_CARE_PROVIDER_SITE_OTHER): Payer: Self-pay | Admitting: Internal Medicine

## 2014-06-12 ENCOUNTER — Encounter: Payer: Self-pay | Admitting: Internal Medicine

## 2014-06-12 VITALS — BP 104/66 | HR 71 | Temp 98.1°F | Ht 71.5 in | Wt 141.2 lb

## 2014-06-12 DIAGNOSIS — K219 Gastro-esophageal reflux disease without esophagitis: Secondary | ICD-10-CM | POA: Insufficient documentation

## 2014-06-12 DIAGNOSIS — E034 Atrophy of thyroid (acquired): Secondary | ICD-10-CM | POA: Insufficient documentation

## 2014-06-12 DIAGNOSIS — C320 Malignant neoplasm of glottis: Secondary | ICD-10-CM

## 2014-06-12 DIAGNOSIS — E038 Other specified hypothyroidism: Secondary | ICD-10-CM

## 2014-06-12 MED ORDER — OXYCODONE HCL 5 MG PO TABS: ORAL_TABLET | ORAL | Status: DC

## 2014-06-12 NOTE — Assessment & Plan Note (Addendum)
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 Patient received radiation therapies, last in December 2014. Ever since then, patient reports persistent sore throat that was previously managed by his radiation oncologist. Patient was last given a prescription for oxycodone 5 mg, on 04/29/2014. Patient requesting more oxycodone today. Previous concern from rad onc clinic for narcotic dependence Long with previous history of obtaining oxycodone outside the health care system. However, patient does have an organic etiology for his pain. Radiation oncologist had encouraged patient to seek drug counseling, but patient had a bad experience which he claims that people were trying to sell illicit drugs to him in the parking lot. Patient states that the oxycodone helps a lot with his sore throat, but also helps him feel better in general and get to sleep. Patient also reports having stressors recently in his life, including his illness and a recent divorce. Patient given very explicit instructions as follows:  - Oxycodone 5 mg (allergy to acetaminophen) #14  - Patient must obtain an orange card by 06/25/2014  - Patient must seek evaluation at [facility] for counseling before 06/25/2014  - Urine drug screen today  - Long-term pain management per pain clinic.  - If any of the above requirements are not met, patient will no longer receive pain prescriptions from our clinic.  Addendum 06/15/2014: Urine drug screen positive for cannabinoids and opiates. Patient expressly denied any illicit drug use while in clinic, and in the setting of her very strict stipulations set for him in order to come continue with prescriptions of oxycodone in our clinic, it seems that the patient has failed to fulfill one of our requirements (which was to not have a concerning urine drug screen). I would be very uncomfortable in refilling his prescription for oxycodone moving forward. 

## 2014-06-12 NOTE — Assessment & Plan Note (Signed)
Patient currently well controlled on Synthroid 125 g per day. Last TSH from one month ago within normal limits.

## 2014-06-12 NOTE — Progress Notes (Signed)
   Subjective:    Patient ID: [name], male    DOB: 1966/01/12, 48 y.o.   MRN: [id number]  HPI  Patient is a 47 year old gentleman with a history of squamous cell carcinoma of the larynx status post radiation therapy (last radiation therapy in December 2014), hypothyroidism, GERD who presents to clinic for establishment with a PCP.  Please refer to separate problem-list charting for more details.  Review of Systems  Constitutional: Negative for fever and chills.  HENT: Positive for sore throat.   Respiratory: Negative for shortness of breath.   Cardiovascular: Negative for chest pain and palpitations.  Gastrointestinal: Negative for nausea, vomiting, abdominal pain, diarrhea, constipation and blood in stool.  Genitourinary: Negative for dysuria and hematuria.  Skin: Negative for rash.  Neurological: Negative for syncope.  Psychiatric/Behavioral: Negative for suicidal ideas.       Objective:   Physical Exam  Constitutional: He is oriented to person, place, and time. He appears well-developed and well-nourished. No distress.  HENT:  Head: Normocephalic and atraumatic.  No thrush noted  Eyes: EOM are normal. Pupils are equal, round, and reactive to light. No scleral icterus.  Neck: Normal range of motion. Neck supple. No thyromegaly present.  Cardiovascular: Normal rate and regular rhythm.  Exam reveals no gallop and no friction rub.   No murmur heard. Pulmonary/Chest: Effort normal and breath sounds normal. No respiratory distress. He has no wheezes. He has no rales.  Abdominal: Soft. Bowel sounds are normal. He exhibits no distension. There is no tenderness. There is no rebound.  Musculoskeletal: Normal range of motion. He exhibits no edema.  Neurological: He is alert and oriented to person, place, and time. No cranial nerve deficit.  Skin: No rash noted.          Assessment & Plan:  Please refer to separate problem-list charting for more details.

## 2014-06-12 NOTE — Patient Instructions (Signed)
We have refilled a limited amount of your oxycodone to help manage with your sore throat. Please get your orange arranged by December 3rd, and also please see counseling at [facility] by December 3rd. We will follow up with you in two weeks regarding further management of your pain.   General Instructions:   Please bring your medicines with you each time you come to clinic.  Medicines may include prescription medications, over-the-counter medications, herbal remedies, eye drops, vitamins, or other pills.   Progress Toward Treatment Goals:  No flowsheet data found.  Self Care Goals & Plans:  Self Care Goal 06/12/2014  Manage my medications take my medicines as prescribed; bring my medications to every visit; refill my medications on time  Eat healthy foods drink diet soda or water instead of juice or soda; eat more vegetables; eat foods that are low in salt; eat baked foods instead of fried foods; eat fruit for snacks and desserts    No flowsheet data found.   Care Management & Community Referrals:  No flowsheet data found.

## 2014-06-13 LAB — PRESCRIPTION ABUSE MONITORING 15P, URINE
Amphetamine/Meth: NEGATIVE ng/mL
BENZODIAZEPINE SCREEN, URINE: NEGATIVE ng/mL
BUPRENORPHINE, URINE: NEGATIVE ng/mL
Barbiturate Screen, Urine: NEGATIVE ng/mL
COCAINE METABOLITES: NEGATIVE ng/mL
Carisoprodol, Urine: NEGATIVE ng/mL
Creatinine, Urine: 184.24 mg/dL (ref >20.0)
Fentanyl, Ur: NEGATIVE ng/mL
MEPERIDINE UR: NEGATIVE ng/mL
METHADONE SCREEN, URINE: NEGATIVE ng/mL
Propoxyphene: NEGATIVE ng/mL
TRAMADOL UR: NEGATIVE ng/mL
ZOLPIDEM, URINE: NEGATIVE ng/mL

## 2014-06-13 NOTE — Progress Notes (Signed)
Internal Medicine Clinic Attending  I saw and evaluated the patient.  I personally confirmed the key portions of the history and exam documented by Dr. Raelene Bott and I reviewed pertinent patient test results.  The assessment, diagnosis, and plan were formulated together and I agree with the documentation in the resident's note. This is a very difficult situation. He has some red flags - admitted to getting opioids from street and tylenol allergy. But, [place] and [facility] controlled databases are OK. It is possible that his pain was undertreated and therefore, he obtained additional opioids any way he could. He has a reason for pain - injury from radiation. 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 He states he wants to take one pill BID. Dr Raelene Bott and I feel comfortable trying Rxing opioids but with strict stipulations. He has laid out his plans and requirements that the pt must meet to cont to receive controlled substances from [facility]. He may also need a GI referral as cont to have dysphagia.

## 2014-06-17 ENCOUNTER — Ambulatory Visit: Payer: Self-pay

## 2014-06-18 LAB — OPIATES/OPIOIDS (LC/MS-MS)
CODEINE URINE: NEGATIVE ng/mL (ref <50)
Hydrocodone: NEGATIVE ng/mL (ref <50)
Hydromorphone: 93 ng/mL — ABNORMAL HIGH (ref <50)
Morphine Urine: NEGATIVE ng/mL (ref <50)
Norhydrocodone, Ur: NEGATIVE ng/mL (ref <50)
Noroxycodone, Ur: 12259 ng/mL — ABNORMAL HIGH (ref <50)
OXYMORPHONE, URINE: 2291 ng/mL — AB (ref <50)
Oxycodone, ur: 2560 ng/mL — ABNORMAL HIGH (ref <50)

## 2014-06-18 LAB — CANNABANOIDS (GC/LC/MS), URINE: THC-COOH UR CONFIRM: 150 ng/mL — AB (ref <5)

## 2014-06-18 LAB — OXYCODONE, URINE (LC/MS-MS)
Noroxycodone, Ur: 12259 ng/mL — ABNORMAL HIGH (ref <50)
OXYCODONE, UR: 2560 ng/mL — AB (ref <50)
Oxymorphone: 2291 ng/mL — ABNORMAL HIGH (ref <50)

## 2014-06-22 ENCOUNTER — Ambulatory Visit: Payer: Self-pay

## 2014-06-26 ENCOUNTER — Encounter: Payer: Self-pay | Admitting: Internal Medicine

## 2014-06-26 ENCOUNTER — Ambulatory Visit (INDEPENDENT_AMBULATORY_CARE_PROVIDER_SITE_OTHER): Payer: Self-pay | Admitting: Internal Medicine

## 2014-06-26 VITALS — BP 122/71 | HR 78 | Temp 98.3°F | Ht 71.5 in | Wt 137.5 lb

## 2014-06-26 DIAGNOSIS — C320 Malignant neoplasm of glottis: Secondary | ICD-10-CM

## 2014-06-26 MED ORDER — OXYCODONE HCL 5 MG PO TABS: ORAL_TABLET | ORAL | Status: DC

## 2014-06-26 NOTE — Patient Instructions (Signed)
General Instructions:  It was nice to see you today, but as a reminder to what we discussed.  1- Please follow up with the doctor at [facility], so you can be placed on a new medication.  2. Please get your [facility] card- Complete the paper Work for this on Monday, when you meet our financial counselor [place] here in our clinic.  3. In order to continue getting pain medications from our clinic, you must not use any pain medications not prescribed by [place], also NO street drugs, including Weed/Marijuana/pot or any other pain medications not prescribed by [place].   4. We will place a referral to the pain clinic and stomach doctor, to find out if you have any other reason to have difficulty swallowing.  Please bring your medicines with you each time you come to clinic.  Medicines may include prescription medications, over-the-counter medications, herbal remedies, eye drops, vitamins, or other pills.

## 2014-06-26 NOTE — Addendum Note (Signed)
Addended by: Bethena Roys on: 06/26/2014 07:08 PM   Modules accepted: Level of Service

## 2014-06-26 NOTE — Progress Notes (Signed)
Patient ID: [name], male   DOB: [birth year], 48 y.o.   MRN: [id number]   Subjective:   Patient ID: [name] male   DOB: 05/28/1966 48 y.o.   MRN: [id number]  HPI: [name] is a 48 y.o. 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 Pt presented today for follow up visit, as he was here- 06/12/2014 and at that visit he was prescribed pain meds- oxycodone- 5mg   With some specific instructions to go to [facility] nd also to complete paper work so he can get the orange card and therefor e be able to go to a pain clinic so where his pain meds will subsequently be prescribed. At last visit pt also had UDS that was positive for Opiates and THCU. Pt today denies having taken any opioids, at least a week prior to visit, does admit to using [facility], but as a trial for relief of pain since nothing else was working. Patient says he has pain swallowing and this makes it difficult to swallow, also he has pain that disturbs his sleep, otherwise he is fine.  Today patient is asking for more pain meds- Ran out a few days ago. He went to [facility], he was prescibed Citalopram- 10mg  took only one dose and felt really shaky, and dizzy, says he almost went to the emergency room. Pt has not had similar reaction since he stopped taking the medication. He called back and he was told to walk into the clinic and a new medication will be tried. Pt also brought paper work to complete process in getting the orange card, but out financial counselor is not available today.    Past Medical History  Diagnosis Date  . Hypothyroidism   . Heart murmur     child  . Headache(784.0)   . 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 Hx of radiation therapy 05/19/13- 06/27/13    glottic larynx, right true vocal cord 6300 cGy 28 sessions  . Shortness of breath    Current Outpatient Prescriptions  Medication Sig Dispense Refill  . albuterol (PROVENTIL HFA;VENTOLIN HFA) 108 (90 BASE)  MCG/ACT inhaler Inhale 1-2 puffs into the lungs every 6 (six) hours as needed for wheezing or shortness of breath.    . fluconazole (DIFLUCAN) 100 MG tablet Take 2 tablets today, then 1 tablet daily x 20 more days. 22 tablet 0  . ibuprofen (ADVIL,MOTRIN) 200 MG tablet Take 400 mg by mouth daily as needed for mild pain.    Marland Kitchen levothyroxine (SYNTHROID, LEVOTHROID) 125 MCG tablet Take 1 tablet (125 mcg total) by mouth daily before breakfast. 30 tablet 3  . omeprazole (PRILOSEC) 40 MG capsule Take 40 mg by mouth at bedtime.    Marland Kitchen oxyCODONE (OXY IR/ROXICODONE) 5 MG immediate release tablet Take 1 tablet up to two times daily, as needed for pain or withdrawal symptoms. Do not exceed 2 tablets per day. 14 tablet 0   No current facility-administered medications for this visit.   Family History  Problem Relation Age of Onset  . Heart attack Father   . COPD Mother   . Emphysema Mother    History   Social History  . Marital Status: Married    Spouse Name: N/A    Number of Children: 3  . Years of Education: N/A   Occupational History  .  [employer]   Social History Main Topics  . Smoking status: Current  Every Day Smoker -- 1.00 packs/day for 34 years    Types: Cigarettes  . Smokeless tobacco: Never Used     Comment:  Cutting back 1 pack pd  . Alcohol Use: No  . Drug Use: No  . Sexual Activity: None   Other Topics Concern  . None   Social History Narrative   The patient is married. Patient has 3 children. 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   The patient has a history of smoking 2 packs of cigarette a day for 34 years. Patient current is trying to quit smoking. Patient denies use of alcohol. Patient denies use of other illicit drugs.   Review of Systems: CONSTITUTIONAL- No Fever, weightloss, night sweat or change in appetite. SKIN- No Rash, colour changes or itching. HEAD- No Headache or dizziness. EYES- No Vision loss, pain, redness, double or blurred  vision. Mouth/throat- No Sorethroat, dentures, or bleeding gums. RESPIRATORY- No Cough or SOB. CARDIAC- No Palpitations, DOE, PND or chest pain. GI- No nausea, vomiting, diarrhoea, constipation, abd pain. URINARY- No Frequency, urgency, straining or dysuria. NEUROLOGIC- No Numbness, syncope, seizures or burning. [facility]- Denies depression or anxiety.  Objective:  Physical Exam: Filed Vitals:   06/26/14 1323  BP: 122/71  Pulse: 78  Temp: 98.3 F (36.8 C)  TempSrc: Oral  Height: 5' 11.5" (1.816 m)  Weight: 137 lb 8 oz (62.37 kg)  SpO2: 99%   GENERAL- alert, pleasant, co-operative, appears as stated age, not in any distress, voice sounds very hoarse. HEENT- Atraumatic, normocephalic, PERRL, EOMI, oral mucosa appears moist, neck supple, no pharyngeal erythema appreciable.  CARDIAC- RRR, no murmurs, rubs or gallops. RESP- Moving equal volumes of air, and clear to auscultation bilaterally, no wheezes or crackles. ABDOMEN- Soft, nontender, no guarding or rebound, no palpable masses or organomegaly, bowel sounds present. NEURO- No obvious Cr N abnormality, strenght upper and lower extremities intact, Gait- Normal. EXTREMITIES- pulse 2+, symmetric, no pedal edema. SKIN- Warm, dry, No rash or lesion. PSYCH- Normal mood and affect, appropriate thought content and speech.  Assessment & Plan:   The patient's case and plan of care was discussed with attending physician, Dr. Marinda Elk.  Please see problem based charting for assessment and plan.

## 2014-06-26 NOTE — Assessment & Plan Note (Addendum)
Pt appears to be a pleasant male, with persistent odynophagia and dysphagia, s/p radiation therapy to his larynx. Pt requesting for pain meds today, he was given #14 tabs at last visit to be taken BID. Which he ran out of 1 week ago. He has fulfilled most of the requirement he was told to fulfill before this visit which includes- Going to [facility]- unfortunately, it appears he did not tolerate the medication well, he brought the medication here today prescribed 06/23/2014 - he will be going back next week for another medication. [place] he brought his paper work today to complete so he can get the orange card, but unfortunately our financial counselor was unavailable  Pt does have a reason to have pain. He does admit to experimenting with Marijuana to help his pain, but still denies taking any opioids 1 week prior to UDS done here in clinic which was positive for opioids. For now we will prescribe # 30 tabs of Oxycodone 5mg  BID. With the understanding that- 1. He must not buy ANY drugs off the street- including Marijuana. Pt agrees to this. 2. He must complete paper work for the orange card- he says he will be here 06/29/2014 to see [facility]. 3. Refferal to pain clinic as soon as orange card is ready. Pt is agreable. 4. Refferal to GI to rule out other causes of Upper GI obstruction or causes of dysphagia and odynophagia, as soon as orange card is ready. Pt is agreeable. 5. Follow up closely in clinic in 2 weeks. 6. He must follow up at [facility]. Pt agrees to do this.  Plan- for now. - Request records from [place]. - Intolerance to Citalopram added to pts Allergy list.  - Oxycodone- 5mg  BID, #30 pills ( of note he says he takes tylenol and is not sure why tylenol was added to his list). - Put in both refferals pending when orange card is ready.

## 2014-06-29 ENCOUNTER — Ambulatory Visit: Payer: Self-pay

## 2014-06-29 NOTE — Progress Notes (Signed)
Internal Medicine Clinic Attending  I saw and evaluated the patient.  I personally confirmed the key portions of the history and exam documented by Dr. Emokpae and I reviewed pertinent patient test results.  The assessment, diagnosis, and plan were formulated together and I agree with the documentation in the resident's note. 

## 2014-07-09 ENCOUNTER — Encounter: Payer: Self-pay | Admitting: Internal Medicine

## 2014-07-09 ENCOUNTER — Ambulatory Visit (INDEPENDENT_AMBULATORY_CARE_PROVIDER_SITE_OTHER): Payer: Self-pay | Admitting: Internal Medicine

## 2014-07-09 VITALS — BP 103/64 | HR 74 | Temp 98.0°F | Ht 71.0 in | Wt 136.5 lb

## 2014-07-09 DIAGNOSIS — R490 Dysphonia: Secondary | ICD-10-CM

## 2014-07-09 DIAGNOSIS — Z0000 Encounter for general adult medical examination without abnormal findings: Secondary | ICD-10-CM

## 2014-07-09 DIAGNOSIS — C320 Malignant neoplasm of glottis: Secondary | ICD-10-CM

## 2014-07-09 DIAGNOSIS — E039 Hypothyroidism, unspecified: Secondary | ICD-10-CM

## 2014-07-09 DIAGNOSIS — K219 Gastro-esophageal reflux disease without esophagitis: Secondary | ICD-10-CM

## 2014-07-09 MED ORDER — OXYCODONE HCL 10 MG PO TABS: 10.0000 mg | ORAL_TABLET | Freq: Three times a day (TID) | ORAL | Status: DC | PRN

## 2014-07-09 NOTE — Progress Notes (Signed)
Medicine attending: Medical history, presenting problems, physical findings, and medications, reviewed with Dr Erik Hoffman and I concur with his evaluation and management plan. 

## 2014-07-09 NOTE — Patient Instructions (Signed)
General Instructions: Please return to see Dr. Wilburn Cornelia.  I will see you in 1 month or 2-3 weeks after you see Dr. Wilburn Cornelia.  Thank you for bringing your medicines today. This helps [place] keep you safe from mistakes.   Progress Toward Treatment Goals:  No flowsheet data found.  Self Care Goals & Plans:  Self Care Goal 07/09/2014  Manage my medications take my medicines as prescribed; bring my medications to every visit; refill my medications on time  Eat healthy foods eat more vegetables; eat foods that are low in salt; eat baked foods instead of fried foods  Stop smoking cut down the number of cigarettes smoked    No flowsheet data found.   Care Management & Community Referrals:  No flowsheet data found.

## 2014-07-09 NOTE — Progress Notes (Signed)
[facility] Subjective:   Patient ID: [name] male   DOB: May 24, 1966 48 y.o.   MRN: [id number]  HPI: [name] is a 48 y.o. male with a PMH below who presents for follow up. I have been assigned as his PCP and this is our first visit together.  He has a history of squamous cell carcinoma on the larynx s/p radiation therapy (last Dec 2014), he has recently established with our clinic.  Post radiation he has had chronic sore throat (8/10 pain today) that has been relieved by oxycodone. He has had some previous concerning behavior and strict requirements have been made of him for him to continue to receive narcotics at our clinic.  He has meet most of these requirements including seeing behavioral health services at [facility].  He has filled out his paperwork and obtained the orange card but there are limited spots for referral and I am told by our staff that it will likely take months to get him an appointment for the pain clinic.  He can pay out of pocket. I have asked him if he can afford this this and he reports he does not feel he can at this time.  He does report that his pain has worsened over the past few weeks.    Of note he was originally biopsed by Dr. Wilburn Cornelia and has not been back to see him after his treatment.  He also notes that when he first started having dysphasia a number of months ago he had a GI evaluation which he reports included a dilation of his esophagus.  He notes this dysphasia at this time is not quite as bad as it was at that time.  He cannot remember the name of the GI physician, it appears in the EMR he may have been referred to Dr Raquel James.  Patient reports he has been back to [facility] on 2 occasions he notes one day he waited from 8:30 am till almost 7pm before being told to come back another day.  He then again when around 8am and waited till around 3pm before leaving prior to being seen.  He is agreeable to try to go again.   Past  Medical History  Diagnosis Date  . Hypothyroidism   . Heart murmur     child  . Headache(784.0)   . 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 Hx of radiation therapy 05/19/13- 06/27/13    glottic larynx, right true vocal cord 6300 cGy 28 sessions  . Shortness of breath    Current Outpatient Prescriptions  Medication Sig Dispense Refill  . albuterol (PROVENTIL HFA;VENTOLIN HFA) 108 (90 BASE) MCG/ACT inhaler Inhale 1-2 puffs into the lungs every 6 (six) hours as needed for wheezing or shortness of breath.    . levothyroxine (SYNTHROID, LEVOTHROID) 125 MCG tablet Take 1 tablet (125 mcg total) by mouth daily before breakfast. 30 tablet 3  . omeprazole (PRILOSEC) 40 MG capsule Take 40 mg by mouth at bedtime.    Marland Kitchen oxyCODONE 10 MG TABS Take 1 tablet (10 mg total) by mouth every 8 (eight) hours as needed for severe pain. Take 1 tablet up to two times daily, as needed for pain or withdrawal symptoms. Do not exceed 2 tablets per day. 90 tablet 0  . ibuprofen (ADVIL,MOTRIN) 200 MG tablet Take 400 mg by mouth daily as needed for mild pain.     No current facility-administered medications for this visit.   Family History  Problem Relation  Age of Onset  . Heart attack Father   . COPD Mother   . Emphysema Mother    History   Social History  . Marital Status: Married    Spouse Name: N/A    Number of Children: 3  . Years of Education: N/A   Occupational History  .  [employer]   Social History Main Topics  . Smoking status: Current Every Day Smoker -- 1.00 packs/day for 34 years    Types: Cigarettes  . Smokeless tobacco: Never Used     Comment:  Cutting back 1/2pack pd  . Alcohol Use: No  . Drug Use: No  . Sexual Activity: None   Other Topics Concern  . None   Social History Narrative   The patient is married. Patient has 3 children. 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   The patient has a history of smoking 2 packs of cigarette a day for 34 years. Patient current  is trying to quit smoking. Patient denies use of alcohol. Patient denies use of other illicit drugs.   Review of Systems: Review of Systems  Constitutional: Negative for weight loss.  HENT: Positive for sore throat.   Eyes: Negative for blurred vision.  Musculoskeletal: Negative for myalgias.  Psychiatric/Behavioral: The patient has insomnia.      Objective:  Physical Exam: Filed Vitals:   07/09/14 1510  BP: 103/64  Pulse: 74  Temp: 98 F (36.7 C)  TempSrc: Oral  Height: 5\' 11"  (1.803 m)  Weight: 136 lb 8 oz (61.916 kg)  SpO2: 99%  Physical Exam  Constitutional: He is well-developed, well-nourished, and in no distress.  HENT:  Head: Normocephalic and atraumatic.  Mouth/Throat: Oropharynx is clear and moist.  Cardiovascular: Normal rate, regular rhythm and normal heart sounds.   Pulmonary/Chest: Effort normal and breath sounds normal. He has no wheezes.  Psychiatric: Affect normal.  Nursing note and vitals reviewed.   Assessment & Plan:  Case discussed with Dr. Beryle Beams  Routine health maintenance Flu shot today, Given stool cards per [facility] study for possible enrollment.  GERD (gastroesophageal reflux disease) Stable Continue omeprazole.  Acquired hypothyroidism Stable on synthroid 125 mcg.  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 Beryle Beams.  It appears that he has not follow up with ENT after his treatment.  He also reports worsening hoarseness and dysphasia especially over the last few weeks.  He notes that 10mg  of oxycodone is helpful in treating his pain and seems thankful for any medication he recieves.  While he is at risk of medication abuse as previously noted he does have very real reasons to be in pain, which also need further evaluation.  I have discussed with him the importance of follow up with ENT (Dr. Wilburn Cornelia) GI, and his oncologist (Dr. Alvy Bimler).  I feel his pain is currently under treated. - WIll Rx 1 month supply of Oxycodone  10mg  with supply of 90 pills to take tid if needed. - Will have patient follow up with ENT and GI for evaluation of his dysphasia, sore throat, and hoarseness. - Will monitor patient closely for signs of narcotic abuse. - He likely would benefit from being managed at a pain clinic and we have referred him however given financial limitations it may take some time for him to be accepted.  We will try our best at this time to manage him.    Medications Ordered Meds ordered this encounter  Medications  .  oxyCODONE 10 MG TABS    Sig: Take 1 tablet (10 mg total) by mouth every 8 (eight) hours as needed for severe pain. Take 1 tablet up to two times daily, as needed for pain or withdrawal symptoms. Do not exceed 2 tablets per day.    Dispense:  90 tablet    Refill:  0   Other Orders Orders Placed This Encounter  Procedures  . Lipid Profile  . Ambulatory referral to ENT    Referral Priority:  Routine    Referral Type:  Consultation    Referral Reason:  Specialty Services Required    Requested Specialty:  Otolaryngology    Number of Visits Requested:  1

## 2014-07-09 NOTE — Assessment & Plan Note (Signed)
Flu shot today, Given stool cards per [facility] study for possible enrollment.

## 2014-07-09 NOTE — Assessment & Plan Note (Signed)
Stable.  Continue omeprazole.

## 2014-07-09 NOTE — Assessment & Plan Note (Signed)
Stable on synthroid 125 mcg.

## 2014-07-10 LAB — LIPID PANEL
Cholesterol: 136 mg/dL (ref 0–200)
HDL: 41 mg/dL (ref >39)
LDL CALC: 82 mg/dL (ref 0–99)
Total CHOL/HDL Ratio: 3.3 Ratio
Triglycerides: 64 mg/dL (ref <150)
VLDL: 13 mg/dL (ref 0–40)

## 2014-07-10 NOTE — Assessment & Plan Note (Signed)
I discussed his case in detail with Dr. Beryle Beams.  It appears that he has not follow up with ENT after his treatment.  He also reports worsening hoarseness and dysphasia especially over the last few weeks.  He notes that 10mg  of oxycodone is helpful in treating his pain and seems thankful for any medication he recieves.  While he is at risk of medication abuse as previously noted he does have very real reasons to be in pain, which also need further evaluation.  I have discussed with him the importance of follow up with ENT (Dr. Wilburn Cornelia) GI, and his oncologist (Dr. Alvy Bimler).  I feel his pain is currently under treated. - WIll Rx 1 month supply of Oxycodone 10mg  with supply of 90 pills to take tid if needed. - Will have patient follow up with ENT and GI for evaluation of his dysphasia, sore throat, and hoarseness. - Will monitor patient closely for signs of narcotic abuse. - He likely would benefit from being managed at a pain clinic and we have referred him however given financial limitations it may take some time for him to be accepted.  We will try our best at this time to manage him.

## 2014-07-21 ENCOUNTER — Encounter: Payer: Self-pay | Admitting: *Deleted

## 2014-07-21 NOTE — Progress Notes (Signed)
Faxed to [facility]oll[facility] Beck[facility], [facility], Dr. Pearlie Oyster note of 05/03/2014.  Documentation requested by Roselyn Reef to close her file on patient.  Gayleen Orem, RN, BSN, [place] [facility] [place] [phone]

## 2014-07-27 NOTE — Addendum Note (Signed)
Addended by: Hulan Fray on: 07/27/2014 02:36 PM   Modules accepted: Orders

## 2014-08-07 ENCOUNTER — Ambulatory Visit (INDEPENDENT_AMBULATORY_CARE_PROVIDER_SITE_OTHER): Payer: Self-pay | Admitting: Internal Medicine

## 2014-08-07 ENCOUNTER — Encounter: Payer: Self-pay | Admitting: Internal Medicine

## 2014-08-07 VITALS — BP 113/74 | HR 69 | Temp 98.1°F | Ht 71.0 in | Wt 144.4 lb

## 2014-08-07 DIAGNOSIS — F1190 Opioid use, unspecified, uncomplicated: Secondary | ICD-10-CM

## 2014-08-07 DIAGNOSIS — E034 Atrophy of thyroid (acquired): Secondary | ICD-10-CM

## 2014-08-07 DIAGNOSIS — E039 Hypothyroidism, unspecified: Secondary | ICD-10-CM

## 2014-08-07 DIAGNOSIS — C320 Malignant neoplasm of glottis: Secondary | ICD-10-CM

## 2014-08-07 DIAGNOSIS — F17210 Nicotine dependence, cigarettes, uncomplicated: Secondary | ICD-10-CM

## 2014-08-07 MED ORDER — OXYCODONE HCL 10 MG PO TABS: 10.0000 mg | ORAL_TABLET | Freq: Three times a day (TID) | ORAL | Status: DC | PRN

## 2014-08-07 MED ORDER — LEVOTHYROXINE SODIUM 125 MCG PO TABS: 125.0000 ug | ORAL_TABLET | Freq: Every day | ORAL | Status: DC

## 2014-08-07 NOTE — Assessment & Plan Note (Signed)
-  Patient is doing his part as requested to see his other physicians as part of the treatment team.  He does seem to have benefited from the narcotic therapy but we will have to continue close monitoring of him. - Check UDS (reports took Oxycodone this morning, denies any drug use) - Follow up in 1 month  - Provide 1 month Rx for Oxycodone 10mg  (#90)

## 2014-08-07 NOTE — Patient Instructions (Signed)
General Instructions: Please keep all of your follow up appointments I will see you in 1 month.  Please bring your medicines with you each time you come to clinic.  Medicines may include prescription medications, over-the-counter medications, herbal remedies, eye drops, vitamins, or other pills.   Progress Toward Treatment Goals:  No flowsheet data found.  Self Care Goals & Plans:  Self Care Goal 08/07/2014  Manage my medications take my medicines as prescribed; bring my medications to every visit; refill my medications on time  Eat healthy foods drink diet soda or water instead of juice or soda; eat more vegetables  Stop smoking cut down the number of cigarettes smoked    No flowsheet data found.   Care Management & Community Referrals:  No flowsheet data found.

## 2014-08-07 NOTE — Assessment & Plan Note (Signed)
Stable on 1105mcg Synthroid, will take over Rx of this medication. Will plan to monitor yearly unless symptoms arise.

## 2014-08-07 NOTE — Progress Notes (Signed)
[facility] Subjective:   Patient ID: [name] male   DOB: Aug 30, 1965 49 y.o.   MRN: [id number]  HPI: [name] is a 49 y.o. 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  He has recently been experiencing dysphasia and increased sore throat.  He has had drug abuse in the past and narcotics were discontinued by his Radiation oncologist.  On our last visit he was given an increased amount of oxycodone but was to follow up with his ENT and GI.  He has a follow up appointment with Dr. Wilburn Cornelia (ENT) on 2/8, with [facility] behavioral health 2/11, and with Dr. Wynetta Emery (GI).  He follows up with his Rad- Onc Dr Isidore Moos on 2/12  He reports that he has typically been taking Oxycodone 3 times a day and occasionally 4 times.  He has taken 2 pills already today, he does report his throat pain is about an 8-9 right now and that for the last 3 days it has been a little worse, he wonders if this is due to a change in the weather.  He continues to have some dysphasia to solids and occasionally liquids which he reports happens especially if he tries to drink too quickly.  He reports the pain medication overall has helped tremendously and he has been able to gain 8 lbs.   Past Medical History  Diagnosis Date  . Hypothyroidism   . Heart murmur     child  . Headache(784.0)   . 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 Hx of radiation therapy 05/19/13- 06/27/13    glottic larynx, right true vocal cord 6300 cGy 28 sessions  . Shortness of breath    Current Outpatient Prescriptions  Medication Sig Dispense Refill  . albuterol (PROVENTIL HFA;VENTOLIN HFA) 108 (90 BASE) MCG/ACT inhaler Inhale 1-2 puffs into the lungs every 6 (six) hours as needed for wheezing or shortness of breath.    . levothyroxine (SYNTHROID, LEVOTHROID) 125 MCG tablet Take 1 tablet (125 mcg total) by mouth daily before breakfast. 30 tablet 11   . omeprazole (PRILOSEC) 40 MG capsule Take 40 mg by mouth at bedtime.    . Oxycodone HCl 10 MG TABS Take 1 tablet (10 mg total) by mouth every 8 (eight) hours as needed. 90 tablet 0  . ibuprofen (ADVIL,MOTRIN) 200 MG tablet Take 400 mg by mouth daily as needed for mild pain.     No current facility-administered medications for this visit.   Family History  Problem Relation Age of Onset  . Heart attack Father   . COPD Mother   . Emphysema Mother    History   Social History  . Marital Status: Married    Spouse Name: N/A    Number of Children: 3  . Years of Education: N/A   Occupational History  .  [employer]   Social History Main Topics  . Smoking status: Current Every Day Smoker -- 1.00 packs/day for 34 years    Types: Cigarettes  . Smokeless tobacco: Never Used     Comment:  Cutting back 1/2pack pd  . Alcohol Use: No  . Drug Use: No  . Sexual Activity: None   Other Topics Concern  . None   Social History Narrative   The patient is married. Patient has 3 children. 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   The  patient has a history of smoking 2 packs of cigarette a day for 34 years. Patient current is trying to quit smoking. Patient denies use of alcohol. Patient denies use of other illicit drugs.   Review of Systems: ROS   See HPI Objective:  Physical Exam: Filed Vitals:   08/07/14 1331  BP: 113/74  Pulse: 69  Temp: 98.1 F (36.7 C)  TempSrc: Oral  Height: 5\' 11"  (1.803 m)  Weight: 144 lb 6.4 oz (65.499 kg)  SpO2: 100%  Physical Exam  Constitutional: He is well-developed, well-nourished, and in no distress.  HENT:  Head: Normocephalic and atraumatic.  Mouth/Throat: Oropharynx is clear and moist.  edentulous  Cardiovascular: Normal rate, regular rhythm and normal heart sounds.   Pulmonary/Chest: Effort normal and breath sounds normal. He has no wheezes.  Psychiatric: Affect normal.  Nursing note and vitals reviewed.    Assessment & Plan:  Case discussed with Dr. Moshe Cipro carcinoma -Patient is doing his part as requested to see his other physicians as part of the treatment team.  He does seem to have benefited from the narcotic therapy but we will have to continue close monitoring of him. - Check UDS (reports took Oxycodone this morning, denies any drug use) - Follow up in 1 month  - Provide 1 month Rx for Oxycodone 10mg  (#90)   Hypothyroidism Stable on 173mcg Synthroid, will take over Rx of this medication. Will plan to monitor yearly unless symptoms arise.     Medications Ordered Meds ordered this encounter  Medications  . Oxycodone HCl 10 MG TABS    Sig: Take 1 tablet (10 mg total) by mouth every 8 (eight) hours as needed.    Dispense:  90 tablet    Refill:  0    May be filled 08/08/14  . levothyroxine (SYNTHROID, LEVOTHROID) 125 MCG tablet    Sig: Take 1 tablet (125 mcg total) by mouth daily before breakfast.    Dispense:  30 tablet    Refill:  11   Other Orders Orders Placed This Encounter  Procedures  . Prescription Abuse Monitoring, 15 Panel

## 2014-08-08 LAB — PRESCRIPTION ABUSE MONITORING 15P, URINE
AMPHETAMINE/METH: NEGATIVE ng/mL
BENZODIAZEPINE SCREEN, URINE: NEGATIVE ng/mL
Barbiturate Screen, Urine: NEGATIVE ng/mL
Buprenorphine, Urine: NEGATIVE ng/mL
CANNABINOID SCRN UR: NEGATIVE ng/mL
CREATININE, URINE: 171.37 mg/dL (ref >20.0)
Carisoprodol, Urine: NEGATIVE ng/mL
Cocaine Metabolites: NEGATIVE ng/mL
Fentanyl, Ur: NEGATIVE ng/mL
Meperidine, Ur: NEGATIVE ng/mL
Methadone Screen, Urine: NEGATIVE ng/mL
Propoxyphene: NEGATIVE ng/mL
Tramadol Scrn, Ur: NEGATIVE ng/mL
ZOLPIDEM, URINE: NEGATIVE ng/mL

## 2014-08-10 NOTE — Progress Notes (Signed)
Internal Medicine Clinic Attending  Case discussed with Dr. Hoffman at the time of the visit.  We reviewed the resident's history and exam and pertinent patient test results.  I agree with the assessment, diagnosis, and plan of care documented in the resident's note.  

## 2014-08-11 LAB — OXYCODONE, URINE (LC/MS-MS)
Noroxycodone, Ur: 4932 ng/mL — ABNORMAL HIGH (ref <50)
OXYMORPHONE, URINE: 1226 ng/mL — AB (ref <50)
Oxycodone, ur: 1074 ng/mL — ABNORMAL HIGH (ref <50)

## 2014-08-11 LAB — OPIATES/OPIOIDS (LC/MS-MS)
CODEINE URINE: NEGATIVE ng/mL (ref <50)
Hydrocodone: NEGATIVE ng/mL (ref <50)
Hydromorphone: NEGATIVE ng/mL (ref <50)
Morphine Urine: NEGATIVE ng/mL (ref <50)
NORHYDROCODONE, UR: NEGATIVE ng/mL (ref <50)
NOROXYCODONE, UR: 4932 ng/mL — AB (ref <50)
OXYCODONE, UR: 1074 ng/mL — AB (ref <50)
Oxymorphone: 1226 ng/mL — ABNORMAL HIGH (ref <50)

## 2014-09-03 ENCOUNTER — Telehealth: Payer: Self-pay | Admitting: *Deleted

## 2014-09-03 NOTE — Telephone Encounter (Signed)
CALLED PATIENT TO ALTER LAB AND FU FOR 09-04-14 DUE TO DR. SQUIRE DOING AN HDR CASE , PATIENT AGREED TO COME IN FOR HIS LAB ON 09-04-14 @ 3 PM AND HIS FU ON 09-04-14 @ 3:20 PM

## 2014-09-04 ENCOUNTER — Ambulatory Visit: Payer: No Typology Code available for payment source | Admitting: Radiation Oncology

## 2014-09-04 ENCOUNTER — Encounter: Payer: Self-pay | Admitting: Radiation Oncology

## 2014-09-04 ENCOUNTER — Ambulatory Visit
Admission: RE | Admit: 2014-09-04 | Discharge: 2014-09-04 | Disposition: A | Discharge status: Home / Self Care | Payer: No Typology Code available for payment source | Source: Ambulatory Visit | Attending: Radiation Oncology | Admitting: Radiation Oncology

## 2014-09-04 VITALS — BP 120/75 | HR 85 | Temp 98.6°F | Resp 20 | Wt 144.0 lb

## 2014-09-04 DIAGNOSIS — Z79899 Other long term (current) drug therapy: Secondary | ICD-10-CM | POA: Insufficient documentation

## 2014-09-04 DIAGNOSIS — E039 Hypothyroidism, unspecified: Secondary | ICD-10-CM

## 2014-09-04 DIAGNOSIS — E038 Other specified hypothyroidism: Secondary | ICD-10-CM

## 2014-09-04 DIAGNOSIS — Z923 Personal history of irradiation: Secondary | ICD-10-CM | POA: Insufficient documentation

## 2014-09-04 DIAGNOSIS — C320 Malignant neoplasm of glottis: Secondary | ICD-10-CM

## 2014-09-04 DIAGNOSIS — Z8521 Personal history of malignant neoplasm of larynx: Secondary | ICD-10-CM | POA: Insufficient documentation

## 2014-09-04 DIAGNOSIS — K143 Hypertrophy of tongue papillae: Secondary | ICD-10-CM | POA: Insufficient documentation

## 2014-09-04 DIAGNOSIS — R1310 Dysphagia, unspecified: Secondary | ICD-10-CM | POA: Insufficient documentation

## 2014-09-04 DIAGNOSIS — F17200 Nicotine dependence, unspecified, uncomplicated: Secondary | ICD-10-CM | POA: Insufficient documentation

## 2014-09-04 LAB — TSH CHCC: TSH: 30.005 m(IU)/L — ABNORMAL HIGH (ref 0.320–4.118)

## 2014-09-04 MED ORDER — LARYNGOSCOPY SOLUTION RAD-ONC
15.0000 mL | Freq: Once | TOPICAL | Status: AC
  Administered 2014-09-04: 15 mL via TOPICAL
  Filled 2014-09-04: qty 15

## 2014-09-04 NOTE — Progress Notes (Signed)
Radiation Oncology         [phone] ________________________________  Name: [name] MRN: [id number]  Date: 09/04/2014  DOB: Apr 04, 1966  Follow-Up Visit Note  CC: Vernie Shanks, MD Jerrell Belfast  Diagnosis and Prior Radiotherapy:   T1 N0 larynx cancer - STAGE I.  63 Gray in 28 fractions to larynx completed 06/27/2013    ICD-9-CM ICD-10-CM   1. Vocal cord cancer 161.0 C32.0 laryngocopy solution for Rad-Onc     Narrative:  The patient returns today for routine follow-up.  Pain Status: pain with swallowing, takes Oxycodone 10 mg every 8 hours regularly as prescribed by Dr. Heber Bolindale, his new PCP.  Weight changes, if any: gained 8 lbs since Oct 2015  Nutritional Status a) intake: foods that are soft/mushy, cut into tiny pieces, appetite good b) using a feeding tube?: na c) weight changes, if any:   Swallowing Status: chokes if he eats fast, "feels like there's always a lump in my throat"  When was last ENT visit?  unsure When is next ENT visit? "tried to see Dr Wilburn Cornelia this past Mon, no appt available, was told to call back after he gets referral.   Other notable issues, if any: pt continues to smoke < 1/2 PPD Scheduled for second esophageal dilatation on 09/07/14  Reports he is taking Levothyroxine 125 mcg daily   ALLERGIES:  is allergic to fentanyl; citalopram; morphine and related; and tylenol.  Meds: Current Outpatient Prescriptions  Medication Sig Dispense Refill  . acetaminophen (TYLENOL) 325 MG tablet Take 650 mg by mouth every 6 (six) hours as needed.    Marland Kitchen albuterol (PROVENTIL HFA;VENTOLIN HFA) 108 (90 BASE) MCG/ACT inhaler Inhale 1-2 puffs into the lungs every 6 (six) hours as needed for wheezing or shortness of breath.    Marland Kitchen ibuprofen (ADVIL,MOTRIN) 200 MG tablet Take 400 mg by mouth daily as needed for mild pain.    Marland Kitchen levothyroxine (SYNTHROID, LEVOTHROID) 125 MCG tablet Take 1 tablet (125 mcg total) by mouth daily before breakfast. 30  tablet 11  . omeprazole (PRILOSEC) 40 MG capsule Take 40 mg by mouth at bedtime.    . Oxycodone HCl 10 MG TABS Take 1 tablet (10 mg total) by mouth every 8 (eight) hours as needed. 90 tablet 0   No current facility-administered medications for this encounter.    Physical Findings: The patient is in no acute distress. Patient is alert and oriented.  weight is 144 lb (65.318 kg). His temperature is 98.6 F (37 C). His blood pressure is 120/75 and his pulse is 85. His respiration is 20. Marland Kitchen Oropharyngeal mucosa is intact with no thrush or lesions. No palpable cervical or supraclavicular lymphadenopathy. Skin intact and smooth over neck.  Heart RRR, Chest CTAB  PROCEDURE NOTE: After anesthetizing the nasal cavity with topical lidocaine and phenylephrine, the flexible endoscope was introduced and passed through the nasal cavity - there continues to be a white coating over the distal base of tongue, but it also appears somewhat swollen today.  No laryngeal lesions appreciated. Cords are symmetrically mobile.   Lab Findings: Lab Results  Component Value Date   WBC 4.5 03/18/2014   HGB 12.3* 03/18/2014   HCT 37.7* 03/18/2014   MCV 97.1 03/18/2014   PLT 202 03/18/2014    Lab Results  Component Value Date   TSH 30.005* 09/04/2014    Radiographic Findings: No results found.  Impression/Plan:    1) Head and Neck Cancer Status: NED  2)  Nutritional Status:no active issues  3) Risk Factors: The patient has been educated about risk factors including alcohol and tobacco abuse; they understand that avoidance of alcohol and tobacco is important to prevent recurrences as well as other cancers. Still smoking.  The patient continues to use tobacco.   4) Swallowing: esophageal dilatation planned  5) Dental: s/p extractions, full (teeth were outside RT field)  6) TSH elevated.  Will raise levothyroxine dose to 143mcg  7) He reports trouble getting an ENT appt and has not seen ENT in >1 yr.  The  base of tongue coating has been refractory to fluconazole and the base of tongue appears a little swollen. Patient feels a "lump and soreness" in throat. It is crucial from him to see an ENT doctor for laryngoscopy.  Gayleen Orem, RN, our Head and Neck Oncology Navigator will facilitate  9) Follow-up in 4 months. The patient was encouraged to call with any issues or questions before then. _____________________________________   [name], MD

## 2014-09-04 NOTE — Progress Notes (Signed)
Pain Status: pain with swallowing, takes Oxycodone 10 mg every 8 hours regularly  Weight changes, if any: gained 8 lbs since Oct 2015  Nutritional Status a) intake: foods that are soft/mushy, cut into tiny pieces, appetite good b) using a feeding tube?: na c) weight changes, if any:   Swallowing Status: chokes if he eats fast, "feels like there's always a lump in my throat"  Dental (if applicable): When was last visit with dentistry-   After completing radaition   Using fluoride trays daily?  Na, all teeth pulled following radiation. He has teeth but states he cannot wear > 30 min, they "make me gag". He states he has tenderness above his upper lip, "brings tears to my eyes if I press there".   When was last ENT visit?  unsure When is next ENT visit? "tried to see Dr Wilburn Cornelia this past Mon, no appt available, was told tol call back after he gets referral.   Other notable issues, if any: pt continues to smoke < 1/2 PPD Scheduled for second esophageal dilatation on 09/07/14

## 2014-09-05 MED ORDER — LEVOTHYROXINE SODIUM 150 MCG PO TABS: 150.0000 ug | ORAL_TABLET | Freq: Every day | ORAL | Status: DC

## 2014-09-07 ENCOUNTER — Encounter (HOSPITAL_COMMUNITY)
Admission: RE | Disposition: A | Discharge status: Home / Self Care | Payer: Self-pay | Source: Ambulatory Visit | Attending: Gastroenterology

## 2014-09-07 ENCOUNTER — Telehealth: Payer: Self-pay | Admitting: *Deleted

## 2014-09-07 ENCOUNTER — Ambulatory Visit (HOSPITAL_COMMUNITY)
Admission: RE | Admit: 2014-09-07 | Discharge: 2014-09-07 | Disposition: A | Discharge status: Home / Self Care | Payer: No Typology Code available for payment source | Source: Ambulatory Visit | Attending: Gastroenterology | Admitting: Gastroenterology

## 2014-09-07 DIAGNOSIS — E039 Hypothyroidism, unspecified: Secondary | ICD-10-CM | POA: Insufficient documentation

## 2014-09-07 DIAGNOSIS — R1310 Dysphagia, unspecified: Secondary | ICD-10-CM | POA: Insufficient documentation

## 2014-09-07 DIAGNOSIS — Z923 Personal history of irradiation: Secondary | ICD-10-CM | POA: Insufficient documentation

## 2014-09-07 DIAGNOSIS — Z8521 Personal history of malignant neoplasm of larynx: Secondary | ICD-10-CM | POA: Insufficient documentation

## 2014-09-07 HISTORY — PX: ESOPHAGEAL MANOMETRY: SHX5429

## 2014-09-07 SURGERY — MANOMETRY, ESOPHAGUS

## 2014-09-07 MED ORDER — LIDOCAINE VISCOUS 2 % MT SOLN
OROMUCOSAL | Status: AC
  Filled 2014-09-07: qty 15

## 2014-09-07 SURGICAL SUPPLY — 2 items
FACESHIELD LNG OPTICON STERILE (SAFETY) IMPLANT
GLOVE BIO SURGEON STRL SZ8 (GLOVE) ×6 IMPLANT

## 2014-09-07 NOTE — Telephone Encounter (Signed)
Per Dr Isidore Moos, called patient and informed him that TSH was high, so Dr Isidore Moos has increased his Levothyroxine dose. New prescription has been sent to [place]. Patient verbalized understanding.

## 2014-09-07 NOTE — Telephone Encounter (Signed)
Received call from patient stating he cannot pick up his Levothyroxine until Thurs. He is asking if he should continue taking his current dose of 125 mcg. Advised to continue with his current dose, but to begin new dose after he picks up prescription Thurs. Pt verbalized understanding, agreement.

## 2014-09-07 NOTE — H&P (Signed)
  Procedure: High resolution esophageal manometry. Dysphagia on chronic hydrocodone therapy. 12/30/2013 normal esophagogastroduodenoscopy. 11/27/2013 modified barium swallow performed by speech pathology. Laryngeal cancer T1N0  History: The patient is a 49 year old male born [birth year]. He has received radiation therapy to treat T1N0 laryngeal cancer.  When he developed esophageal dysphagia, he underwent a diagnostic esophagogastroduodenoscopy on 12/30/2013 which was normal. He chronically takes proton pump inhibitor therapy. He continues to experience esophageal dysphagia without odynophagia.  The patient is scheduled to undergo high-resolution esophageal manometry to rule out esophageal dysmotility.  Medication allergies: Morphine and fentanyl  Past medical history: Tonsillectomy. Hypothyroidism. 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  Chronic medications: Albuterol inhaler. Levothyroxin. Omeprazole. Oxycodone. Carafate.  Plan: Proceed with high resolution esophageal manometry to evaluate chronic esophageal dysphagia. Narcotics can induce esophageal dysmotility (type III achalasia pattern).

## 2014-09-08 ENCOUNTER — Encounter (HOSPITAL_COMMUNITY): Payer: Self-pay | Admitting: Gastroenterology

## 2014-09-08 ENCOUNTER — Telehealth: Payer: Self-pay | Admitting: *Deleted

## 2014-09-08 NOTE — Telephone Encounter (Signed)
Spoke with Anderson Malta at [facility], relayed Dr. Pearlie Oyster request that patient be seen by Dr. Wilburn Cornelia as soon as possible for f/u.  She verbalized understanding, will contact patient.  Gayleen Orem, RN, BSN, [place] [facility] [place] [phone]

## 2014-09-09 ENCOUNTER — Other Ambulatory Visit: Payer: Self-pay | Admitting: *Deleted

## 2014-09-09 DIAGNOSIS — C320 Malignant neoplasm of glottis: Secondary | ICD-10-CM

## 2014-09-09 NOTE — Telephone Encounter (Signed)
Spoke w/ dr Heber [place], he will address at appt 2/18

## 2014-09-10 ENCOUNTER — Encounter: Payer: Self-pay | Admitting: Internal Medicine

## 2014-09-10 ENCOUNTER — Ambulatory Visit (INDEPENDENT_AMBULATORY_CARE_PROVIDER_SITE_OTHER): Payer: No Typology Code available for payment source | Admitting: Internal Medicine

## 2014-09-10 VITALS — BP 116/74 | HR 83 | Temp 98.3°F | Ht 71.0 in | Wt 144.6 lb

## 2014-09-10 DIAGNOSIS — R1310 Dysphagia, unspecified: Secondary | ICD-10-CM | POA: Insufficient documentation

## 2014-09-10 DIAGNOSIS — C320 Malignant neoplasm of glottis: Secondary | ICD-10-CM

## 2014-09-10 DIAGNOSIS — F17200 Nicotine dependence, unspecified, uncomplicated: Secondary | ICD-10-CM | POA: Insufficient documentation

## 2014-09-10 DIAGNOSIS — E038 Other specified hypothyroidism: Secondary | ICD-10-CM

## 2014-09-10 DIAGNOSIS — Z720 Tobacco use: Secondary | ICD-10-CM

## 2014-09-10 DIAGNOSIS — E034 Atrophy of thyroid (acquired): Secondary | ICD-10-CM

## 2014-09-10 DIAGNOSIS — R490 Dysphonia: Secondary | ICD-10-CM

## 2014-09-10 DIAGNOSIS — R1319 Other dysphagia: Secondary | ICD-10-CM | POA: Insufficient documentation

## 2014-09-10 MED ORDER — OXYCODONE HCL 10 MG PO TABS: 10.0000 mg | ORAL_TABLET | Freq: Four times a day (QID) | ORAL | Status: DC | PRN

## 2014-09-10 MED ORDER — NICOTINE 7 MG/24HR TD PT24: 7.0000 mg | MEDICATED_PATCH | Freq: Every day | TRANSDERMAL | Status: DC

## 2014-09-10 MED ORDER — NICOTINE 14 MG/24HR TD PT24: 14.0000 mg | MEDICATED_PATCH | Freq: Every day | TRANSDERMAL | Status: DC

## 2014-09-10 NOTE — Progress Notes (Signed)
[facility] Subjective:   Patient ID: AMAHD Whitehead male   DOB: [birth year] 49 y.o.   MRN: [id number]  HPI: [name] is a 49 y.o. 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  He has recently been experiencing dysphasia and increased sore throat.  He did see Dr. Wynetta Emery (GI) and had a repeat EGD and MBS completed but I do not yet have those results.   He had an appointment with  Dr. Wilburn Cornelia (ENT) on 2/8, but apparently there was some sort of mix up and he was not seen. He did see [facility] 2/11, and was started on hydroxzine for sleep as well as zoloft. He saw his Rad- Onc Dr Isidore Moos on 2/12, she increased his synthroid to 123mcg.  and has nasopharyngealscopy where she noted some continued yellowish material and reiterated the improtance of follow up with Dr. Wilburn Cornelia. Plan to follow up in 4 months.  He did arrive at our office yesterday requesting a refill of his Oxycodone (He was given an Rx of 90 pills on 08/07/14.)  He notes that since his EGD and visit with Dr. Isidore Moos his throat was irratated and he was "doubling up" on some of his medication for increased pain.  He did not receive any pain medications from those doctors as per our agreement and has come to me.   Past Medical History  Diagnosis Date  . Hypothyroidism   . Heart murmur     child  . Headache(784.0)   . 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 Hx of radiation therapy 05/19/13- 06/27/13    glottic larynx, right true vocal cord 6300 cGy 28 sessions  . Shortness of breath    Current Outpatient Prescriptions  Medication Sig Dispense Refill  . acetaminophen (TYLENOL) 325 MG tablet Take 650 mg by mouth every 6 (six) hours as needed.    Marland Kitchen albuterol (PROVENTIL HFA;VENTOLIN HFA) 108 (90 BASE) MCG/ACT inhaler Inhale 1-2 puffs into the lungs every 6 (six) hours as needed for wheezing or shortness of breath.    .  hydrOXYzine (ATARAX/VISTARIL) 50 MG tablet Take 50 mg by mouth at bedtime.    Marland Kitchen levothyroxine (SYNTHROID, LEVOTHROID) 150 MCG tablet Take 1 tablet (150 mcg total) by mouth daily before breakfast. 30 tablet 5  . omeprazole (PRILOSEC) 40 MG capsule Take 40 mg by mouth at bedtime.    . Oxycodone HCl 10 MG TABS Take 1 tablet (10 mg total) by mouth every 6 (six) hours as needed. 120 tablet 0  . sertraline (ZOLOFT) 50 MG tablet Take 50 mg by mouth daily.    Marland Kitchen ibuprofen (ADVIL,MOTRIN) 200 MG tablet Take 400 mg by mouth daily as needed for mild pain.    . nicotine (NICODERM CQ - DOSED IN MG/24 HOURS) 14 mg/24hr patch Place 1 patch (14 mg total) onto the skin daily. 14 patch 0  . [START ON 09/25/2014] nicotine (NICODERM CQ - DOSED IN MG/24 HR) 7 mg/24hr patch Place 1 patch (7 mg total) onto the skin daily. 14 patch 1   No current facility-administered medications for this visit.   Family History  Problem Relation Age of Onset  . Heart attack Father   . COPD Mother   . Emphysema Mother    History   Social History  . Marital Status: Legally Separated    Spouse Name: N/A  . Number of Children: 3  . Years  of Education: N/A   Occupational History  .  [employer]   Social History Main Topics  . Smoking status: Current Every Day Smoker -- 1.00 packs/day for 34 years    Types: Cigarettes  . Smokeless tobacco: Never Used     Comment: Smokes a little less.  . Alcohol Use: No  . Drug Use: No  . Sexual Activity: Not on file   Other Topics Concern  . None   Social History Narrative   The patient is married. Patient has 3 children. 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   The patient has a history of smoking 2 packs of cigarette a day for 34 years. Patient current is trying to quit smoking. Patient denies use of alcohol. Patient denies use of other illicit drugs.   Review of Systems: Review of Systems  Constitutional: Negative for fever, chills, weight loss and  malaise/fatigue.  HENT: Positive for sore throat. Negative for congestion and nosebleeds.   Eyes: Negative for blurred vision.  Respiratory: Negative for cough, sputum production and shortness of breath.   Cardiovascular: Negative for chest pain, palpitations and leg swelling.  Gastrointestinal: Negative for heartburn, nausea, vomiting, abdominal pain, diarrhea and constipation.  Genitourinary: Negative for dysuria.  Skin: Negative for rash.  Neurological: Negative for dizziness and headaches.  Psychiatric/Behavioral: Negative for depression and substance abuse.     See HPI Objective:  Physical Exam: Filed Vitals:   09/10/14 1313  BP: 116/74  Pulse: 83  Temp: 98.3 F (36.8 C)  TempSrc: Oral  Height: 5\' 11"  (1.803 m)  Weight: 144 lb 9.6 oz (65.59 kg)  SpO2: 99%  Physical Exam  Constitutional: He is well-developed, well-nourished, and in no distress.  HENT:  Head: Normocephalic and atraumatic.  Mouth/Throat: Oropharynx is clear and moist.  edentulous  Cardiovascular: Normal rate, regular rhythm and normal heart sounds.   Pulmonary/Chest: Effort normal and breath sounds normal. He has no wheezes.  Psychiatric: Affect normal.  Nursing note and vitals reviewed.   Wt Readings from Last 5 Encounters:  09/10/14 144 lb 9.6 oz (65.59 kg)  09/04/14 144 lb (65.318 kg)  08/07/14 144 lb 6.4 oz (65.499 kg)  07/09/14 136 lb 8 oz (61.916 kg)  06/26/14 137 lb 8 oz (62.37 kg)    Assessment & Plan:  Case discussed with Dr. Beryle Beams  Glottis carcinoma - I am pleased that he has been trying to have appropriate follow up with his specialists.  He will need to still follow up with Dr. Alvy Bimler and Dr. Wilburn Cornelia andwe are especially working to try to facilitate his followup with Dr. Wilburn Cornelia give Dr. Lanell Persons request.   Dysphagia - I do not see a full note from Dr. Wynetta Emery of the EGD last week, we will try to obtain this, per patient he did not require a dilation and Dr. Durenda Age H&P  note does report that narcotics may be the cause of his dysphasia.  This presents a difficult situation as the narcotic therapy has been very helpful for the patient to continue work and his ADL especially eating.  He was able to gain/maintain weight.  We will need to carefully monitor his narcotic therapy and hope to titrate down in the future.  For the next 2 months will increase his Oxycodone 10mg  Q6 #120.  Given 2 months of Rx and he will follow up in 2 months.   Hypothyroidism - Synthroid 188mcg.   Tobacco use disorder -Discussed the importance  of complete cessation, he knows he needs this and is requesting help.  He is still smoking about a half a pack per day so will Rx him 2 weeks of 47mcg nicotine patch followwed by 2 weeks of 43mcg patch.     Hoarseness of voice - Follow up with ENT, we are working on this.     Medications Ordered Meds ordered this encounter  Medications  . sertraline (ZOLOFT) 50 MG tablet    Sig: Take 50 mg by mouth daily.  . hydrOXYzine (ATARAX/VISTARIL) 50 MG tablet    Sig: Take 50 mg by mouth at bedtime.  Marland Kitchen DISCONTD: Oxycodone HCl 10 MG TABS    Sig: Take 1 tablet (10 mg total) by mouth every 6 (six) hours as needed.    Dispense:  120 tablet    Refill:  0    Rx 1/2  . Oxycodone HCl 10 MG TABS    Sig: Take 1 tablet (10 mg total) by mouth every 6 (six) hours as needed.    Dispense:  120 tablet    Refill:  0    Rx 2/2 May be filled 30 days after printed date.  . nicotine (NICODERM CQ - DOSED IN MG/24 HOURS) 14 mg/24hr patch    Sig: Place 1 patch (14 mg total) onto the skin daily.    Dispense:  14 patch    Refill:  0  . nicotine (NICODERM CQ - DOSED IN MG/24 HR) 7 mg/24hr patch    Sig: Place 1 patch (7 mg total) onto the skin daily.    Dispense:  14 patch    Refill:  1   Other Orders No orders of the defined types were placed in this encounter.

## 2014-09-10 NOTE — Progress Notes (Signed)
Medicine attending: Medical history, presenting problems, physical findings, and medications, reviewed with Dr Erik Hoffman and I concur with his evaluation and management plan. 

## 2014-09-10 NOTE — Patient Instructions (Signed)
General Instructions: Please start using the 69mcg nicotine patches, after 2 weeks you can go down to the 27mcg patches then stop completely.   Treatment Goals:  Goals (1 Years of Data) as of 09/10/14    None      Progress Toward Treatment Goals:  No flowsheet data found.  Self Care Goals & Plans:  Self Care Goal 08/07/2014  Manage my medications take my medicines as prescribed; bring my medications to every visit; refill my medications on time  Eat healthy foods drink diet soda or water instead of juice or soda; eat more vegetables  Stop smoking cut down the number of cigarettes smoked    No flowsheet data found.   Care Management & Community Referrals:  No flowsheet data found.

## 2014-09-11 NOTE — Assessment & Plan Note (Addendum)
-   I do not see a full note from Dr. Wynetta Emery of the EGD last week, we will try to obtain this, per patient he did not require a dilation and Dr. Durenda Age H&P note does report that narcotics may be the cause of his dysphasia.  This presents a difficult situation as the narcotic therapy has been very helpful for the patient to continue work and his ADL especially eating.  He was able to gain/maintain weight.  We will need to carefully monitor his narcotic therapy and hope to titrate down in the future.  For the next 2 months will increase his Oxycodone 10mg  Q6 #120.  Given 2 months of Rx and he will follow up in 2 months.

## 2014-09-11 NOTE — Assessment & Plan Note (Signed)
-   I am pleased that he has been trying to have appropriate follow up with his specialists.  He will need to still follow up with Dr. Alvy Bimler and Dr. Wilburn Cornelia andwe are especially working to try to facilitate his followup with Dr. Wilburn Cornelia give Dr. Lanell Persons request.

## 2014-09-11 NOTE — Assessment & Plan Note (Signed)
-   Synthroid 172mcg.

## 2014-09-11 NOTE — Assessment & Plan Note (Signed)
-   Follow up with ENT, we are working on this.

## 2014-09-11 NOTE — Assessment & Plan Note (Signed)
-  Discussed the importance of complete cessation, he knows he needs this and is requesting help.  He is still smoking about a half a pack per day so will Rx him 2 weeks of 75mcg nicotine patch followwed by 2 weeks of 53mcg patch.

## 2014-09-23 ENCOUNTER — Telehealth: Payer: Self-pay | Admitting: *Deleted

## 2014-09-23 NOTE — Telephone Encounter (Signed)
LVM for patient reminding him of importance of appt with Dr. Wilburn Cornelia this Friday.  He returned call, LVM that he is aware.  Gayleen Orem, RN, BSN, [place] at [place] [phone]

## 2014-09-23 NOTE — Telephone Encounter (Signed)
Confirmed with Kaleen Odea ENT, that patient has a follow-up appt with Dr. Wilburn Cornelia this Friday, 09/25/14.  Emphasized need for laryngoscopy, pt's complaint of "throat lump and soreness" per his visit with Dr. Isidore Moos  09/04/14.  I indicated I would call patient to remind of appt.  Gayleen Orem, RN, BSN, [facility] [place] [phone]

## 2014-10-08 ENCOUNTER — Encounter: Payer: Self-pay | Admitting: Internal Medicine

## 2014-10-19 ENCOUNTER — Telehealth: Payer: Self-pay | Admitting: *Deleted

## 2014-10-19 NOTE — Telephone Encounter (Signed)
Confirmed with Mitchell Heir ENT, that patient met with Dr. Wilburn Cornelia on 09/25/14.  Gayleen Orem, RN, BSN, [place] at [place] [phone]

## 2014-10-26 ENCOUNTER — Telehealth: Payer: Self-pay | Admitting: *Deleted

## 2014-10-26 NOTE — Telephone Encounter (Signed)
On 10-26-14 fax medical records to [facility] it was consult note, end of tx note, follow up notes.

## 2014-11-11 ENCOUNTER — Telehealth: Payer: Self-pay | Admitting: Internal Medicine

## 2014-11-11 NOTE — Telephone Encounter (Signed)
Call to patient to confirm appointment for 11/12/14 at 1:45 lmtcb

## 2014-11-12 ENCOUNTER — Encounter: Payer: Self-pay | Admitting: Internal Medicine

## 2014-11-12 ENCOUNTER — Ambulatory Visit (INDEPENDENT_AMBULATORY_CARE_PROVIDER_SITE_OTHER): Payer: No Typology Code available for payment source | Admitting: Internal Medicine

## 2014-11-12 VITALS — BP 125/71 | HR 94 | Temp 98.3°F | Ht 71.0 in | Wt 140.6 lb

## 2014-11-12 DIAGNOSIS — E034 Atrophy of thyroid (acquired): Secondary | ICD-10-CM

## 2014-11-12 DIAGNOSIS — R5383 Other fatigue: Secondary | ICD-10-CM

## 2014-11-12 DIAGNOSIS — R490 Dysphonia: Secondary | ICD-10-CM

## 2014-11-12 DIAGNOSIS — R0602 Shortness of breath: Secondary | ICD-10-CM | POA: Insufficient documentation

## 2014-11-12 DIAGNOSIS — K219 Gastro-esophageal reflux disease without esophagitis: Secondary | ICD-10-CM

## 2014-11-12 DIAGNOSIS — Z79899 Other long term (current) drug therapy: Secondary | ICD-10-CM

## 2014-11-12 DIAGNOSIS — C320 Malignant neoplasm of glottis: Secondary | ICD-10-CM

## 2014-11-12 DIAGNOSIS — Z720 Tobacco use: Secondary | ICD-10-CM

## 2014-11-12 DIAGNOSIS — E038 Other specified hypothyroidism: Secondary | ICD-10-CM

## 2014-11-12 DIAGNOSIS — R1310 Dysphagia, unspecified: Secondary | ICD-10-CM

## 2014-11-12 DIAGNOSIS — R0600 Dyspnea, unspecified: Secondary | ICD-10-CM

## 2014-11-12 DIAGNOSIS — Z0289 Encounter for other administrative examinations: Secondary | ICD-10-CM

## 2014-11-12 DIAGNOSIS — F17200 Nicotine dependence, unspecified, uncomplicated: Secondary | ICD-10-CM

## 2014-11-12 DIAGNOSIS — R5381 Other malaise: Secondary | ICD-10-CM

## 2014-11-12 DIAGNOSIS — E039 Hypothyroidism, unspecified: Secondary | ICD-10-CM

## 2014-11-12 LAB — COMPLETE METABOLIC PANEL WITH GFR
ALBUMIN: 3.8 g/dL (ref 3.5–5.2)
ALK PHOS: 48 U/L (ref 39–117)
ALT: 21 U/L (ref 0–53)
AST: 17 U/L (ref 0–37)
BUN: 13 mg/dL (ref 6–23)
CHLORIDE: 107 meq/L (ref 96–112)
CO2: 25 mEq/L (ref 19–32)
Calcium: 8.8 mg/dL (ref 8.4–10.5)
Creat: 0.88 mg/dL (ref 0.50–1.35)
GFR, Est African American: >89 mL/min
GFR, Est Non African American: >89 mL/min
Glucose, Bld: 99 mg/dL (ref 70–99)
Potassium: 3.8 mEq/L (ref 3.5–5.3)
SODIUM: 142 meq/L (ref 135–145)
TOTAL PROTEIN: 5.9 g/dL — AB (ref 6.0–8.3)
Total Bilirubin: 0.3 mg/dL (ref 0.2–1.2)

## 2014-11-12 LAB — CBC WITH DIFFERENTIAL/PLATELET
Basophils Absolute: 0 10*3/uL (ref 0.0–0.1)
Basophils Relative: 0 % (ref 0–1)
EOS PCT: 5 % (ref 0–5)
Eosinophils Absolute: 0.3 10*3/uL (ref 0.0–0.7)
HEMATOCRIT: 39.5 % (ref 39.0–52.0)
HEMOGLOBIN: 13.3 g/dL (ref 13.0–17.0)
LYMPHS PCT: 28 % (ref 12–46)
Lymphs Abs: 1.4 10*3/uL (ref 0.7–4.0)
MCH: 31.9 pg (ref 26.0–34.0)
MCHC: 33.7 g/dL (ref 30.0–36.0)
MCV: 94.7 fL (ref 78.0–100.0)
MONO ABS: 0.4 10*3/uL (ref 0.1–1.0)
MONOS PCT: 7 % (ref 3–12)
MPV: 10.1 fL (ref 8.6–12.4)
Neutro Abs: 3.1 10*3/uL (ref 1.7–7.7)
Neutrophils Relative %: 60 % (ref 43–77)
Platelets: 213 10*3/uL (ref 150–400)
RBC: 4.17 MIL/uL — AB (ref 4.22–5.81)
RDW: 13.6 % (ref 11.5–15.5)
WBC: 5.1 10*3/uL (ref 4.0–10.5)

## 2014-11-12 LAB — TSH: TSH: 30.602 u[IU]/mL — ABNORMAL HIGH (ref 0.350–4.500)

## 2014-11-12 LAB — T4, FREE: Free T4: 0.47 ng/dL — ABNORMAL LOW (ref 0.80–1.80)

## 2014-11-12 MED ORDER — OXYCODONE HCL 10 MG PO TABS: 10.0000 mg | ORAL_TABLET | Freq: Four times a day (QID) | ORAL | Status: DC | PRN

## 2014-11-12 MED ORDER — OMEPRAZOLE 40 MG PO CPDR: DELAYED_RELEASE_CAPSULE | ORAL | Status: DC

## 2014-11-12 NOTE — Assessment & Plan Note (Signed)
-   Continues to have some mild dysphasia, as dr Wynetta Emery has previously noted this could be due to his narcotic medications and we will try to start weaning these down. I am concerned that GERD is contributiing and will try to get this better controlled.

## 2014-11-12 NOTE — Assessment & Plan Note (Addendum)
-   He does complain of malaise and fatigue despite taking 168mcg of synthroid.  It is early for checking a TSH but I will try to do this along with a Free T4 to establish if his dosage is sufficient.   ADDENDUM: TSH remains high at 30, the more accurate at this time Free T4 remains low.  I called back [name] and he is taking the medicatioon on an empty stomach.  I will increase his medication to 160mcg.

## 2014-11-12 NOTE — Patient Instructions (Addendum)
Take Omeprazole twice a day for the next week to see if this improves your heartburn.  Stop by in a month for a TSH recheck  General Instructions:   Thank you for bringing your medicines today. This helps [place] keep you safe from mistakes.   Progress Toward Treatment Goals:  No flowsheet data found.  Self Care Goals & Plans:  Self Care Goal 08/07/2014  Manage my medications take my medicines as prescribed; bring my medications to every visit; refill my medications on time  Eat healthy foods drink diet soda or water instead of juice or soda; eat more vegetables  Stop smoking cut down the number of cigarettes smoked    No flowsheet data found.   Care Management & Community Referrals:  No flowsheet data found.

## 2014-11-12 NOTE — Progress Notes (Signed)
[facility] Subjective:   Patient ID: [name] male   DOB: 09/18/1965 49 y.o.   MRN: [id number]  HPI: [name] is a 49 y.o. male with a PMH detailed below who presents for 2 month follow up.  He has seen Dr. 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  He does note changes c/w GERD and recommended better control of this as that may contribute to his chronic sore thoat and globus sensation.  Mr Chaffin does reports that over the past 2-3 weeks he has been told by co workers that his neck looks larger.  He reports he has had severe heartburn over that time despite daily omeprazole taken at night prior to dinner.  He has been using Oxycodone and taking usually 1 pill three times a day but reports that it is typically worse in the morning and sometimes takes 2 pills in the morning.  His last dose was this morning which he reports was his last pills  (was given 2 months of #120 pills).  He has cut down on smoking so that 1 pack is lasting him a whole week.  He reports the nicotine patches were too expensive but he did buy some gum to help.  He has felt increased malaise and fatigue over the last few weeks and wonder if that is part of nicotine withdraw.  Past Medical History  Diagnosis Date  . Hypothyroidism   . Heart murmur     child  . Headache(784.0)   . 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 Hx of radiation therapy 05/19/13- 06/27/13    glottic larynx, right true vocal cord 6300 cGy 28 sessions  . Shortness of breath    Current Outpatient Prescriptions  Medication Sig Dispense Refill  . acetaminophen (TYLENOL) 325 MG tablet Take 650 mg by mouth every 6 (six) hours as needed.    Marland Kitchen albuterol (PROVENTIL HFA;VENTOLIN HFA) 108 (90 BASE) MCG/ACT inhaler Inhale 1-2 puffs into the lungs every 6 (six) hours as needed for wheezing or shortness of breath.    . hydrOXYzine (ATARAX/VISTARIL) 50 MG tablet Take 50 mg by mouth  at bedtime.    Marland Kitchen ibuprofen (ADVIL,MOTRIN) 200 MG tablet Take 400 mg by mouth daily as needed for mild pain.    Marland Kitchen levothyroxine (SYNTHROID, LEVOTHROID) 150 MCG tablet Take 1 tablet (150 mcg total) by mouth daily before breakfast. 30 tablet 5  . nicotine (NICODERM CQ - DOSED IN MG/24 HOURS) 14 mg/24hr patch Place 1 patch (14 mg total) onto the skin daily. 14 patch 0  . nicotine (NICODERM CQ - DOSED IN MG/24 HR) 7 mg/24hr patch Place 1 patch (7 mg total) onto the skin daily. 14 patch 1  . omeprazole (PRILOSEC) 40 MG capsule Twice a day for 1-2 weeks then once daily 60 capsule 5  . Oxycodone HCl 10 MG TABS Take 1 tablet (10 mg total) by mouth every 6 (six) hours as needed. 90 tablet 0  . sertraline (ZOLOFT) 50 MG tablet Take 50 mg by mouth daily.     No current facility-administered medications for this visit.   Family History  Problem Relation Age of Onset  . Heart attack Father   . COPD Mother   . Emphysema Mother    History   Social History  . Marital Status: Legally Separated    Spouse Name: N/A  . Number of Children: 3  . Years of Education: N/A   Occupational  History  .  [employer]   Social History Main Topics  . Smoking status: Current Every Day Smoker -- 1.00 packs/day for 34 years    Types: Cigarettes  . Smokeless tobacco: Never Used     Comment: Smokes a little less. DOWN TO 1 PACK A WEEK  . Alcohol Use: No  . Drug Use: No  . Sexual Activity: Not on file   Other Topics Concern  . None   Social History Narrative   The patient is married. Patient has 3 children. 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   The patient has a history of smoking 2 packs of cigarette a day for 34 years. Patient current is trying to quit smoking. Patient denies use of alcohol. Patient denies use of other illicit drugs.   Review of Systems: Review of Systems  Constitutional: Positive for malaise/fatigue. Negative for fever, chills and weight loss.  HENT: Positive  for sore throat.   Eyes: Negative for blurred vision.  Respiratory: Negative for cough.   Cardiovascular: Negative for chest pain.  Gastrointestinal: Positive for heartburn. Negative for nausea, vomiting, abdominal pain, diarrhea and constipation.  Genitourinary: Negative for dysuria.  Musculoskeletal: Negative for myalgias.  Neurological: Negative for headaches.  Psychiatric/Behavioral: Negative for substance abuse.     Objective:  Physical Exam: Filed Vitals:   11/12/14 1400  BP: 125/71  Pulse: 94  Temp: 98.3 F (36.8 C)  TempSrc: Oral  Height: 5\' 11"  (1.803 m)  Weight: 140 lb 9.6 oz (63.776 kg)  SpO2: 100%  Physical Exam  Constitutional: He is well-developed, well-nourished, and in no distress.  HENT:  Head: Normocephalic and atraumatic.  Mouth/Throat: Oropharynx is clear and moist.  Neck: Neck supple. No tracheal deviation present.  Overlying skin darkening and changes c/w previous radiation. This throat is non tedner although feels slightly enlarged uniformly, no discreet nodularity or adenopathy.  Cardiovascular: Normal rate and regular rhythm.   Pulmonary/Chest: Effort normal and breath sounds normal. No stridor. He has no wheezes.  Abdominal: Soft. Bowel sounds are normal.  Nursing note and vitals reviewed.   Assessment & Plan:  Case discussed with Dr. Brien Few carcinoma - It is comforting that he has had visualization of this airway within the past month by Dr Wilburn Cornelia and has follow up with Dr Isidore Moos in 2 months.  I am not fully sure what to make of his chronic sore throat and concern that his neck has gotten larger.  We will continue to monitor him for signs of recurrences and check a CBC and CMP today as well as a TSH (and free T4 as he had a dosage change of synthroid to 177mcg in February).  I have instructed him that if this continues to get worse we may consider getting imaging of the area with either a ultrasound (thyroid) or CT of his neck.  I will  continue his narcotic medications with close following of UDS screens but will decrease him to 90 pills a month.  Will have him follow up in 2-3 months if he does not get worse.   GERD (gastroesophageal reflux disease) -I suspect this may be the primary culpret for his worsening symptoms and he does have findings to support this on Dr. Danie Binder exam. - I will have him increase his PPI to twice a day for the next 1-2 weeks to see if this improves his symptoms, if they are not controlled we may need to refer  him back to Dr Wynetta Emery (GI)   Dysphagia - Continues to have some mild dysphasia, as dr Wynetta Emery has previously noted this could be due to his narcotic medications and we will try to start weaning these down. I am concerned that GERD is contributiing and will try to get this better controlled.   Hypothyroidism - He does complain of malaise and fatigue despite taking 131mcg of synthroid.  It is early for checking a TSH but I will try to do this along with a Free T4 to establish if his dosage is sufficient.   Tobacco use disorder I stressed the importance of cessation.  He will continue to try using nicotine replacement gum.    Dyspnea -He does report exertional dyspnea, I reviewed his CXR from October 2015 which has findings suggestive of COPD.  I will refer him for pulmonary function testing to better evaluate this. -Continue PRN albuterol. -Stressed smoking cessation     Medications Ordered Meds ordered this encounter  Medications  . DISCONTD: Oxycodone HCl 10 MG TABS    Sig: Take 1 tablet (10 mg total) by mouth every 6 (six) hours as needed.    Dispense:  90 tablet    Refill:  0    Rx 1/3 May be filled on printed date.  Marland Kitchen DISCONTD: Oxycodone HCl 10 MG TABS    Sig: Take 1 tablet (10 mg total) by mouth every 6 (six) hours as needed.    Dispense:  90 tablet    Refill:  0    Rx 2/3 May be filled 30 days after printed date.  . Oxycodone HCl 10 MG TABS    Sig: Take 1 tablet  (10 mg total) by mouth every 6 (six) hours as needed.    Dispense:  90 tablet    Refill:  0    Rx 3/3 May be filled 60 days after printed date.  Marland Kitchen omeprazole (PRILOSEC) 40 MG capsule    Sig: Twice a day for 1-2 weeks then once daily    Dispense:  60 capsule    Refill:  5   Other Orders Orders Placed This Encounter  Procedures  . CMP with Estimated GFR ([id number])  . CBC with Diff  . Prescription Abuse Monitoring, 15 Panel  . TSH    Standing Status: Future     Number of Occurrences: 1     Standing Expiration Date: 01/01/2015  . T4, Free  . Pulmonary function test    Standing Status: Future     Number of Occurrences:      Standing Expiration Date: 11/12/2015    Order Specific Question:  Where should this test be performed?    Answer:  [facility]    Order Specific Question:  Full PFT: includes the following: basic spirometry, spirometry pre & post bronchodilator, diffusion capacity (DLCO), lung volumes    Answer:  Full PFT

## 2014-11-12 NOTE — Assessment & Plan Note (Signed)
-   It is comforting that he has had visualization of this airway within the past month by Dr Wilburn Cornelia and has follow up with Dr Isidore Moos in 2 months.  I am not fully sure what to make of his chronic sore throat and concern that his neck has gotten larger.  We will continue to monitor him for signs of recurrences and check a CBC and CMP today as well as a TSH (and free T4 as he had a dosage change of synthroid to 154mcg in February).  I have instructed him that if this continues to get worse we may consider getting imaging of the area with either a ultrasound (thyroid) or CT of his neck.  I will continue his narcotic medications with close following of UDS screens but will decrease him to 90 pills a month.  Will have him follow up in 2-3 months if he does not get worse.

## 2014-11-12 NOTE — Assessment & Plan Note (Signed)
-  He does report exertional dyspnea, I reviewed his CXR from October 2015 which has findings suggestive of COPD.  I will refer him for pulmonary function testing to better evaluate this. -Continue PRN albuterol. -Stressed smoking cessation

## 2014-11-12 NOTE — Assessment & Plan Note (Signed)
-  I suspect this may be the primary culpret for his worsening symptoms and he does have findings to support this on Dr. Danie Binder exam. - I will have him increase his PPI to twice a day for the next 1-2 weeks to see if this improves his symptoms, if they are not controlled we may need to refer him back to Dr Wynetta Emery (GI)

## 2014-11-12 NOTE — Assessment & Plan Note (Signed)
I stressed the importance of cessation.  He will continue to try using nicotine replacement gum.

## 2014-11-13 LAB — PRESCRIPTION ABUSE MONITORING 15P, URINE
Amphetamine/Meth: NEGATIVE ng/mL
BARBITURATE SCREEN, URINE: NEGATIVE ng/mL
BENZODIAZEPINE SCREEN, URINE: NEGATIVE ng/mL
BUPRENORPHINE, URINE: NEGATIVE ng/mL
CANNABINOID SCRN UR: NEGATIVE ng/mL
CARISOPRODOL, URINE: NEGATIVE ng/mL
CREATININE, URINE: 221 mg/dL (ref >20.0)
Cocaine Metabolites: NEGATIVE ng/mL
Fentanyl, Ur: NEGATIVE ng/mL
Meperidine, Ur: NEGATIVE ng/mL
Methadone Screen, Urine: NEGATIVE ng/mL
Propoxyphene: NEGATIVE ng/mL
TRAMADOL UR: NEGATIVE ng/mL
ZOLPIDEM, URINE: NEGATIVE ng/mL

## 2014-11-13 MED ORDER — LEVOTHYROXINE SODIUM 175 MCG PO TABS: 175.0000 ug | ORAL_TABLET | Freq: Every day | ORAL | Status: DC

## 2014-11-13 NOTE — Progress Notes (Signed)
INTERNAL MEDICINE TEACHING ATTENDING ADDENDUM - Aldine Contes, MD: I personally saw and evaluated Mr. Heiland in this clinic visit in conjunction with the resident, Dr. Heber Renova. I have discussed patient's plan of care with medical resident during this visit. I have confirmed the physical exam findings and have read and agree with the clinic note including the plan with the following addition: - Patient with mild diffuse anterior neck swelling, non tender, no increased local warmth - Will check TSH and free T4 to rule out possible uncontrolled hypothyroidism - If wnl would consider imaging and referral back to ENT

## 2014-11-13 NOTE — Addendum Note (Signed)
Addended by: Joni Reining C on: 11/13/2014 12:05 PM   Modules accepted: Orders

## 2014-11-16 LAB — OPIATES/OPIOIDS (LC/MS-MS)
Codeine Urine: NEGATIVE ng/mL (ref <50)
HYDROMORPHONE: NEGATIVE ng/mL (ref <50)
Hydrocodone: NEGATIVE ng/mL (ref <50)
Morphine Urine: NEGATIVE ng/mL (ref <50)
NORHYDROCODONE, UR: NEGATIVE ng/mL (ref <50)
NOROXYCODONE, UR: 6793 ng/mL — AB (ref <50)
Oxycodone, ur: 3187 ng/mL — ABNORMAL HIGH (ref <50)
Oxymorphone: 1596 ng/mL — ABNORMAL HIGH (ref <50)

## 2014-11-16 LAB — OXYCODONE, URINE (LC/MS-MS)
Noroxycodone, Ur: 6793 ng/mL — ABNORMAL HIGH (ref <50)
OXYCODONE, UR: 3187 ng/mL — AB (ref <50)
Oxymorphone: 1596 ng/mL — ABNORMAL HIGH (ref <50)

## 2014-11-26 ENCOUNTER — Telehealth: Payer: Self-pay | Admitting: *Deleted

## 2014-11-26 ENCOUNTER — Encounter: Payer: Self-pay | Admitting: Internal Medicine

## 2014-11-26 NOTE — Telephone Encounter (Signed)
[name] - daughter of pt [phone] - called to have Dr Heber [place] send a letter with dx to [employer] for financial aid. [name] with [employer] is doing form for pt. Her E-mail is [email or web address]. Hilda Blades Talya Quain RN 11/26/14 10AM

## 2014-11-26 NOTE — Telephone Encounter (Signed)
Pt's daughter would like to pick this letter up here at the office

## 2014-11-26 NOTE — Telephone Encounter (Signed)
Dr Heber Landen talked to [facility] about note with Dx.

## 2014-11-28 NOTE — Telephone Encounter (Signed)
Note was written for Mr Judice.

## 2014-12-02 ENCOUNTER — Ambulatory Visit (HOSPITAL_COMMUNITY)
Admission: RE | Admit: 2014-12-02 | Discharge: 2014-12-02 | Disposition: A | Discharge status: Home / Self Care | Payer: No Typology Code available for payment source | Source: Ambulatory Visit | Attending: Internal Medicine | Admitting: Internal Medicine

## 2014-12-02 DIAGNOSIS — R1310 Dysphagia, unspecified: Secondary | ICD-10-CM | POA: Insufficient documentation

## 2014-12-02 LAB — PULMONARY FUNCTION TEST
DL/VA % pred: 70 %
DL/VA: 3.28 ml/min/mmHg/L
DLCO COR % PRED: 68 %
DLCO cor: 22.46 ml/min/mmHg
DLCO unc % pred: 65 %
DLCO unc: 21.59 ml/min/mmHg
FEF 25-75 POST: 2.23 L/s
FEF 25-75 Pre: 1.94 L/sec
FEF2575-%CHANGE-POST: 14 %
FEF2575-%PRED-POST: 63 %
FEF2575-%PRED-PRE: 55 %
FEV1-%Change-Post: 5 %
FEV1-%Pred-Post: 92 %
FEV1-%Pred-Pre: 88 %
FEV1-Post: 3.69 L
FEV1-Pre: 3.51 L
FEV1FVC-%CHANGE-POST: 4 %
FEV1FVC-%PRED-PRE: 82 %
FEV6-%CHANGE-POST: 0 %
FEV6-%Pred-Post: 103 %
FEV6-%Pred-Pre: 103 %
FEV6-POST: 5.13 L
FEV6-Pre: 5.1 L
FEV6FVC-%Change-Post: 0 %
FEV6FVC-%Pred-Post: 97 %
FEV6FVC-%Pred-Pre: 97 %
FVC-%CHANGE-POST: 0 %
FVC-%PRED-PRE: 106 %
FVC-%Pred-Post: 107 %
FVC-POST: 5.49 L
FVC-PRE: 5.45 L
POST FEV1/FVC RATIO: 67 %
PRE FEV1/FVC RATIO: 64 %
Post FEV6/FVC ratio: 93 %
Pre FEV6/FVC Ratio: 93 %
RV % pred: 141 %
RV: 2.94 L
TLC % pred: 120 %
TLC: 8.49 L

## 2014-12-02 MED ORDER — ALBUTEROL SULFATE (2.5 MG/3ML) 0.083% IN NEBU
2.5000 mg | INHALATION_SOLUTION | Freq: Once | RESPIRATORY_TRACT | Status: AC
  Administered 2014-12-02: 2.5 mg via RESPIRATORY_TRACT

## 2014-12-11 ENCOUNTER — Telehealth: Payer: Self-pay | Admitting: *Deleted

## 2014-12-11 NOTE — Telephone Encounter (Signed)
[facility] outpt pharm calls and states pt oxycodone will be due tomorrow but that they will be closed tomorrow and Sunday, may they fill today, do you approve?

## 2014-12-11 NOTE — Telephone Encounter (Signed)
Ok to fill today

## 2014-12-18 ENCOUNTER — Encounter: Payer: Self-pay | Admitting: Internal Medicine

## 2014-12-18 DIAGNOSIS — J439 Emphysema, unspecified: Secondary | ICD-10-CM | POA: Insufficient documentation

## 2014-12-28 ENCOUNTER — Ambulatory Visit: Payer: Self-pay

## 2015-01-01 ENCOUNTER — Ambulatory Visit
Admission: RE | Admit: 2015-01-01 | Discharge: 2015-01-01 | Disposition: A | Discharge status: Home / Self Care | Payer: Self-pay | Source: Ambulatory Visit | Attending: Radiation Oncology | Admitting: Radiation Oncology

## 2015-01-01 NOTE — Progress Notes (Addendum)
Pain Status:   Nutritional Status 1) Using a feeding tube?:  2) Summary of daily intake:  3) Weight changes, if any:   Wt Readings from Last 3 Encounters:  11/12/14 140 lb 9.6 oz (63.776 kg)  09/10/14 144 lb 9.6 oz (65.59 kg)  09/04/14 144 lb (65.318 kg)    Swallowing Status:   Smoking or chewing tobacco? Current cigarette smoker 1ppd  Dental (if applicable):  Next dental appointment is on - none scheduled.  Using fluoride trays daily?    Summary of last ENT visit is: 10/07/14 - Per Dr. Wilburn Cornelia - recommends reflux therapy for dysphagia and hoarseness, GERD.  Follow up in 3 months.  Next ENT visit is on   Summary of last medical oncology visit (if applicable) is: none Next med/onc visit is on : None  Imaging done in the last month (if applicable) revealed: none  Other notable issues, if any: Last TSH 11/12/14 =30.602, T4 free=0.47

## 2015-01-14 ENCOUNTER — Ambulatory Visit: Payer: Self-pay | Admitting: Internal Medicine

## 2015-01-14 ENCOUNTER — Ambulatory Visit (INDEPENDENT_AMBULATORY_CARE_PROVIDER_SITE_OTHER): Payer: Self-pay | Admitting: Internal Medicine

## 2015-01-14 ENCOUNTER — Ambulatory Visit: Payer: Self-pay

## 2015-01-14 ENCOUNTER — Encounter: Payer: Self-pay | Admitting: Internal Medicine

## 2015-01-14 VITALS — BP 103/62 | HR 72 | Temp 98.0°F | Ht 71.5 in | Wt 147.5 lb

## 2015-01-14 DIAGNOSIS — J439 Emphysema, unspecified: Secondary | ICD-10-CM

## 2015-01-14 DIAGNOSIS — E038 Other specified hypothyroidism: Secondary | ICD-10-CM

## 2015-01-14 DIAGNOSIS — C320 Malignant neoplasm of glottis: Secondary | ICD-10-CM

## 2015-01-14 DIAGNOSIS — R490 Dysphonia: Secondary | ICD-10-CM

## 2015-01-14 DIAGNOSIS — Z0289 Encounter for other administrative examinations: Secondary | ICD-10-CM

## 2015-01-14 DIAGNOSIS — F17200 Nicotine dependence, unspecified, uncomplicated: Secondary | ICD-10-CM

## 2015-01-14 DIAGNOSIS — F17210 Nicotine dependence, cigarettes, uncomplicated: Secondary | ICD-10-CM

## 2015-01-14 DIAGNOSIS — E034 Atrophy of thyroid (acquired): Secondary | ICD-10-CM

## 2015-01-14 DIAGNOSIS — K219 Gastro-esophageal reflux disease without esophagitis: Secondary | ICD-10-CM

## 2015-01-14 LAB — TSH: TSH: 133.742 u[IU]/mL — AB (ref 0.350–4.500)

## 2015-01-14 MED ORDER — OXYCODONE HCL 10 MG PO TABS: 10.0000 mg | ORAL_TABLET | Freq: Four times a day (QID) | ORAL | Status: DC | PRN

## 2015-01-14 NOTE — Assessment & Plan Note (Signed)
-   Still having the hoarseness and pain but he thinks it is improved with the addition of the PPI.  Will continue to monitor.

## 2015-01-14 NOTE — Patient Instructions (Signed)
General Instructions:  I want you to continue to work on stopping smoking.    Continue omeprazole twice a day.  Please try to continue to decrease narcotic use.  Please bring your medicines with you each time you come to clinic.  Medicines may include prescription medications, over-the-counter medications, herbal remedies, eye drops, vitamins, or other pills.   Progress Toward Treatment Goals:  No flowsheet data found.  Self Care Goals & Plans:  Self Care Goal 01/14/2015  Manage my medications take my medicines as prescribed; bring my medications to every visit; refill my medications on time  Eat healthy foods -  Stop smoking set a quit date and stop smoking    No flowsheet data found.   Care Management & Community Referrals:  No flowsheet data found.

## 2015-01-14 NOTE — Assessment & Plan Note (Signed)
-  Taking synthroid 124mcg - Last TSH 30 will repeat today and adjust as necessary.

## 2015-01-14 NOTE — Assessment & Plan Note (Signed)
Discussed PFTS with patient.  He is currently well controlled with PRN albuterol.

## 2015-01-14 NOTE — Assessment & Plan Note (Signed)
-   He seems to have improved on the BID PPI - Will continue BID Omeprazole for now, hopefully we can eventually get this down to daily. I have again stressed smoking cessation.

## 2015-01-14 NOTE — Assessment & Plan Note (Signed)
-   1 pack is now lasting him a week which is great improvement. - He cannot afford the nicotine patches, I have advised him to try to go "cold [place]".

## 2015-01-14 NOTE — Assessment & Plan Note (Addendum)
-   He does reports his sore throat has improved on BID PPI, he is still requiring 3 pills of Oxycodone daily. (usually 2 in morning and 1 later in the day).  He does not feel he can decrease this. - Will check UDS for monitoring - Continue Oxycodone 10mg  #90 per month (he has 1 Rx he just filled, will provide with 1 additional month) patient to follow up in about 2 months, at that time I have told him I plan to decrease the dose further.

## 2015-01-14 NOTE — Progress Notes (Signed)
[facility] Subjective:   Patient ID: [name] male   DOB: 06-Nov-1965 49 y.o.   MRN: [id number]  HPI: [name] is a 49 y.o. male with a PMH detailed below who presents for 2 month follow up of his sore throat.  He reports he has been taking oxycodone usually 3 pills a day, 2 in the morning which he reports his pain and secretions are the worst and the 1 at a later time in the day.  He feels that it is helping him greatly to tolerate eating especially in the morning.  He does feel that taking omeprazole twice a day has been very helpful and wants to continue to do this.  He notes he has follow up with Dr Isidore Moos coming up soon.  He feels his thyroid is better controlled although he occasionally does feel somewhat weak and tired.  Past Medical History  Diagnosis Date  . Hypothyroidism   . Heart murmur     child  . Headache(784.0)   . 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 Hx of radiation therapy 05/19/13- 06/27/13    glottic larynx, right true vocal cord 6300 cGy 28 sessions  . Shortness of breath    Current Outpatient Prescriptions  Medication Sig Dispense Refill  . acetaminophen (TYLENOL) 325 MG tablet Take 650 mg by mouth every 6 (six) hours as needed.    Marland Kitchen albuterol (PROVENTIL HFA;VENTOLIN HFA) 108 (90 BASE) MCG/ACT inhaler Inhale 1-2 puffs into the lungs every 6 (six) hours as needed for wheezing or shortness of breath.    . hydrOXYzine (ATARAX/VISTARIL) 50 MG tablet Take 50 mg by mouth at bedtime.    Marland Kitchen ibuprofen (ADVIL,MOTRIN) 200 MG tablet Take 400 mg by mouth daily as needed for mild pain.    Marland Kitchen levothyroxine (SYNTHROID, LEVOTHROID) 175 MCG tablet Take 1 tablet (175 mcg total) by mouth daily before breakfast. 30 tablet 5  . nicotine (NICODERM CQ - DOSED IN MG/24 HOURS) 14 mg/24hr patch Place 1 patch (14 mg total) onto the skin daily. 14 patch 0  . nicotine (NICODERM CQ - DOSED IN MG/24 HR) 7 mg/24hr patch Place 1 patch (7 mg total) onto the skin  daily. 14 patch 1  . omeprazole (PRILOSEC) 40 MG capsule Twice a day for 1-2 weeks then once daily 60 capsule 5  . Oxycodone HCl 10 MG TABS Take 1 tablet (10 mg total) by mouth every 6 (six) hours as needed. 90 tablet 0  . sertraline (ZOLOFT) 50 MG tablet Take 50 mg by mouth daily.     No current facility-administered medications for this visit.   Family History  Problem Relation Age of Onset  . Heart attack Father   . COPD Mother   . Emphysema Mother    History   Social History  . Marital Status: Legally Separated    Spouse Name: N/A  . Number of Children: 3  . Years of Education: N/A   Occupational History  .  [employer]   Social History Main Topics  . Smoking status: Current Every Day Smoker -- 0.20 packs/day for 34 years    Types: Cigarettes  . Smokeless tobacco: Never Used     Comment: Smokes a little less. DOWN TO 1 PACK A WEEK  . Alcohol Use: No  . Drug Use: No  . Sexual Activity: Not on file   Other Topics Concern  . None   Social History Narrative  The patient is married. Patient has 3 children. 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   The patient has a history of smoking 2 packs of cigarette a day for 34 years. Patient current is trying to quit smoking. Patient denies use of alcohol. Patient denies use of other illicit drugs.   Review of Systems: Review of Systems  Constitutional: Positive for malaise/fatigue (occasionally). Negative for fever, chills and weight loss.  Respiratory: Negative for cough and shortness of breath.   Cardiovascular: Negative for chest pain and palpitations.  Gastrointestinal: Negative for heartburn and abdominal pain.  Genitourinary: Negative for dysuria.  Musculoskeletal: Negative for myalgias.  Neurological: Negative for dizziness and headaches.  Psychiatric/Behavioral: Negative for depression and substance abuse. The patient has insomnia.      Objective:  Physical Exam: Filed Vitals:   01/14/15  1009  BP: 103/62  Pulse: 72  Temp: 98 F (36.7 C)  TempSrc: Oral  Height: 5' 11.5" (1.816 m)  Weight: 147 lb 8 oz (66.906 kg)  SpO2: 100%  Physical Exam  Constitutional: No distress.  thin  HENT:  Head: Normocephalic and atraumatic.  Neck: Neck supple.  Cardiovascular: Normal rate and regular rhythm.   Pulmonary/Chest: Effort normal and breath sounds normal.  Abdominal: Bowel sounds are normal.  Musculoskeletal: He exhibits no edema.  Nursing note and vitals reviewed.   Wt Readings from Last 5 Encounters:  01/14/15 147 lb 8 oz (66.906 kg)  11/12/14 140 lb 9.6 oz (63.776 kg)  09/10/14 144 lb 9.6 oz (65.59 kg)  09/04/14 144 lb (65.318 kg)  08/07/14 144 lb 6.4 oz (65.499 kg)   Weight has continued to increase Assessment & Plan:  Case discussed with Dr. Daryll Drown  Emphysema of lung Discussed PFTS with patient.  He is currently well controlled with PRN albuterol.  Glottis carcinoma - He does reports his sore throat has improved on BID PPI, he is still requiring 3 pills of Oxycodone daily. (usually 2 in morning and 1 later in the day).  He does not feel he can decrease this. - Will check UDS for monitoring - Continue Oxycodone 10mg  #90 per month (he has 1 Rx he just filled, will provide with 1 additional month) patient to follow up in about 2 months, at that time I have told him I plan to decrease the dose further.   GERD (gastroesophageal reflux disease) - He seems to have improved on the BID PPI - Will continue BID Omeprazole for now, hopefully we can eventually get this down to daily. I have again stressed smoking cessation.  Hypothyroidism -Taking synthroid 147mcg - Last TSH 30 will repeat today and adjust as necessary.  Tobacco use disorder - 1 pack is now lasting him a week which is great improvement. - He cannot afford the nicotine patches, I have advised him to try to go "cold [place]".  Hoarseness of voice - Still having the hoarseness and pain but he thinks it is  improved with the addition of the PPI.  Will continue to monitor.    Medications Ordered Meds ordered this encounter  Medications  . DISCONTD: Oxycodone HCl 10 MG TABS    Sig: Take 1 tablet (10 mg total) by mouth every 6 (six) hours as needed.    Dispense:  90 tablet    Refill:  0    May be filled on 02/10/15  . Oxycodone HCl 10 MG TABS    Sig: Take 1 tablet (10 mg total) by mouth every 6 (six) hours  as needed.    Dispense:  90 tablet    Refill:  0    May be filled on 02/10/15   Other Orders Orders Placed This Encounter  Procedures  . TSH  . Prescription Abuse Monitoring, 15 Panel   Follow Up: Return in about 2 months (around 03/16/2015).

## 2015-01-15 LAB — PRESCRIPTION ABUSE MONITORING 15P, URINE
Amphetamine/Meth: NEGATIVE ng/mL
BARBITURATE SCREEN, URINE: NEGATIVE ng/mL
BUPRENORPHINE, URINE: NEGATIVE ng/mL
CANNABINOID SCRN UR: NEGATIVE ng/mL
COCAINE METABOLITES: NEGATIVE ng/mL
CREATININE, URINE: 165.51 mg/dL (ref >20.0)
Carisoprodol, Urine: NEGATIVE ng/mL
Fentanyl, Ur: NEGATIVE ng/mL
Meperidine, Ur: NEGATIVE ng/mL
Methadone Screen, Urine: NEGATIVE ng/mL
Propoxyphene: NEGATIVE ng/mL
Tramadol Scrn, Ur: NEGATIVE ng/mL
Zolpidem, Urine: NEGATIVE ng/mL

## 2015-01-18 LAB — BENZODIAZEPINES (GC/LC/MS), URINE
Alprazolam metabolite (GC/LC/MS), ur confirm: NEGATIVE ng/mL (ref <25)
Clonazepam metabolite (GC/LC/MS), ur confirm: NEGATIVE ng/mL (ref <25)
Flurazepam metabolite (GC/LC/MS), ur confirm: NEGATIVE ng/mL (ref <50)
Lorazepam (GC/LC/MS), ur confirm: NEGATIVE ng/mL (ref <50)
MIDAZOLAMU: NEGATIVE ng/mL (ref <50)
NORDIAZEPAMU: NEGATIVE ng/mL (ref <50)
Oxazepam (GC/LC/MS), ur confirm: 139 ng/mL — ABNORMAL HIGH (ref <50)
TEMAZEPAMU: NEGATIVE ng/mL (ref <50)
Triazolam metabolite (GC/LC/MS), ur confirm: NEGATIVE ng/mL (ref <50)

## 2015-01-18 LAB — OPIATES/OPIOIDS (LC/MS-MS)
Codeine Urine: NEGATIVE ng/mL (ref <50)
HYDROMORPHONE: NEGATIVE ng/mL (ref <50)
Hydrocodone: NEGATIVE ng/mL (ref <50)
Morphine Urine: NEGATIVE ng/mL (ref <50)
Norhydrocodone, Ur: NEGATIVE ng/mL (ref <50)
Noroxycodone, Ur: >10000 ng/mL — ABNORMAL HIGH (ref <50)
OXYCODONE, UR: 7178 ng/mL — AB (ref <50)
OXYMORPHONE, URINE: 2401 ng/mL — AB (ref <50)

## 2015-01-18 LAB — OXYCODONE, URINE (LC/MS-MS)
Noroxycodone, Ur: >10000 ng/mL — ABNORMAL HIGH (ref <50)
OXYCODONE, UR: 7178 ng/mL — AB (ref <50)
Oxymorphone: 2401 ng/mL — ABNORMAL HIGH (ref <50)

## 2015-01-19 NOTE — Progress Notes (Signed)
Internal Medicine Clinic Attending  Case discussed with Dr. Hoffman soon after the resident saw the patient.  We reviewed the resident's history and exam and pertinent patient test results.  I agree with the assessment, diagnosis, and plan of care documented in the resident's note. 

## 2015-01-21 ENCOUNTER — Telehealth: Payer: Self-pay | Admitting: Pharmacist

## 2015-01-22 NOTE — Telephone Encounter (Signed)
S:    [name] is a 49 y.o. male contacted for help with hypothyroidism medication management  Current regimen: levothyroxine 175 mcg/day. Patient verbalizes taking the medication as prescribed as soon as he wakes up in the morning along with other medications.    O:  Of note, although patient states he is taking the medication and picked it up from [place], [place], per pharmacy records, patient did not pick up the levothyroxine prescription. Requested pharmacy to fax med profile to [place] to scan into chart.   BP Readings from Last 3 Encounters:  01/14/15 103/62  11/12/14 125/71  09/10/14 116/74   HR on 01/14/15 was 72  Pertinent labs: Component Value Date/Time   TSH 133.742* 01/14/2015 1107   TSH 30.005* 09/04/2014 1455   A/P: Subtherapeutic. Based on pharmacy records, it is possible that patient is not taking the medication at all.    Patient was educated about levothyroxine including importance of adherence, how to manage missed/doubled doses, take on empty stomach, and drug interactions.   Follow-up Patient advised to schedule appointment with me next week and bring in ALL medication bottles to his visit.  Flossie Dibble Clinical Pharmacist

## 2015-01-28 ENCOUNTER — Telehealth: Payer: Self-pay | Admitting: Pharmacist

## 2015-01-28 NOTE — Telephone Encounter (Signed)
Call to patient to confirm appointment for 01/29/15 at 2:00 lmtcb

## 2015-01-29 ENCOUNTER — Ambulatory Visit (INDEPENDENT_AMBULATORY_CARE_PROVIDER_SITE_OTHER): Payer: Self-pay | Admitting: Pharmacist

## 2015-01-29 DIAGNOSIS — Z79899 Other long term (current) drug therapy: Secondary | ICD-10-CM

## 2015-01-29 DIAGNOSIS — Z91199 Patient's noncompliance with other medical treatment and regimen due to unspecified reason: Secondary | ICD-10-CM

## 2015-01-29 DIAGNOSIS — Z719 Counseling, unspecified: Secondary | ICD-10-CM

## 2015-01-29 DIAGNOSIS — Z9114 Patient's other noncompliance with medication regimen: Secondary | ICD-10-CM

## 2015-01-29 DIAGNOSIS — Z7189 Other specified counseling: Secondary | ICD-10-CM

## 2015-01-29 DIAGNOSIS — Z9119 Patient's noncompliance with other medical treatment and regimen: Secondary | ICD-10-CM

## 2015-01-29 DIAGNOSIS — E039 Hypothyroidism, unspecified: Secondary | ICD-10-CM

## 2015-01-29 NOTE — Progress Notes (Signed)
S:    [name] is a 49 y.o. male reports to clinical pharmacist appointment for hypothyroid medication management. Patient did not bring medication bottles.  Current regimen: levothyroxine 175 mg daily. Patient expressed adherence challenges due to cost (~%30/month). Per pharmacy, patient was not filling Rx consistently but did pick it up on 01/26/15.   Patient reports cold intolerance but denies other symptoms.  O:  BP Readings from Last 3 Encounters:  01/14/15 103/62  11/12/14 125/71  09/10/14 116/74   HR 72 on 01/14/15  Pertinent labs:    Component Value Date/Time   TSH 133.742* 01/14/2015 1107   TSH 30.005* 09/04/2014 1455   Free T4 on 11/12/14: 0.47  A/P: Subtherapeutic, potentially due to adherence concerns. Patient states he is currently taking the medication and is working with Development worker, community for assistance and was also given a discount card in the meantime.  Patient was educated about levothyroxine including importance of adherence, how to manage missed/doubled doses, take on empty stomach, and drug interactions. Patient was advised to contact me if further medication concerns arise.   Follow-up Patient appointment with PCP on 03/11/15  Flossie Dibble Clinical Pharmacist  30 minutes spent face-to-face with the patient during the encounter. 75% of time spent on education. 15% of time was spent on assessment and plan.

## 2015-01-29 NOTE — Patient Instructions (Addendum)
Hypothyroidism The thyroid is a large gland located in the lower front of your neck. The thyroid gland helps control metabolism. Metabolism is how your body handles food. It controls metabolism with the hormone thyroxine. When this gland is underactive (hypothyroid), it produces too little hormone.  CAUSES These include:   Absence or destruction of thyroid tissue.  Goiter due to iodine deficiency.  Goiter due to medications.  Congenital defects (since birth).  Problems with the pituitary. This causes a lack of TSH (thyroid stimulating hormone). This hormone tells the thyroid to turn out more hormone. SYMPTOMS  Lethargy (feeling as though you have no energy)  Cold intolerance  Weight gain (in spite of normal food intake)  Dry skin  Coarse hair  Menstrual irregularity (if severe, may lead to infertility)  Slowing of thought processes Cardiac problems are also caused by insufficient amounts of thyroid hormone. Hypothyroidism in the newborn is cretinism, and is an extreme form. It is important that this form be treated adequately and immediately or it will lead rapidly to retarded physical and mental development. DIAGNOSIS  To prove hypothyroidism, your caregiver may do blood tests and ultrasound tests. Sometimes the signs are hidden. It may be necessary for your caregiver to watch this illness with blood tests either before or after diagnosis and treatment. TREATMENT  Low levels of thyroid hormone are increased by using synthetic thyroid hormone. This is a safe, effective treatment. It usually takes about four weeks to gain the full effects of the medication. After you have the full effect of the medication, it will generally take another four weeks for problems to leave. Your caregiver may start you on low doses. If you have had heart problems the dose may be gradually increased. It is generally not an emergency to get rapidly to normal. HOME CARE INSTRUCTIONS   Take your  medications as your caregiver suggests. Let your caregiver know of any medications you are taking or start taking. Your caregiver will help you with dosage schedules.  As your condition improves, your dosage needs may increase. It will be necessary to have continuing blood tests as suggested by your caregiver.  Report all suspected medication side effects to your caregiver. SEEK MEDICAL CARE IF: Seek medical care if you develop:  Sweating.  Tremulousness (tremors).  Anxiety.  Rapid weight loss.  Heat intolerance.  Emotional swings.  Diarrhea.  Weakness. SEEK IMMEDIATE MEDICAL CARE IF:  You develop chest pain, an irregular heart beat (palpitations), or a rapid heart beat. MAKE SURE YOU:   Understand these instructions.  Will watch your condition.  Will get help right away if you are not doing well or get worse. Document Released: 07/10/2005 Document Revised: 10/02/2011 Document Reviewed: 02/28/2008 [facility] Patient Information 2015 [place], [place]. This information is not intended to replace advice given to you by your health care provider. Make sure you discuss any questions you have with your health care provider.  Here are some non-medicines you can try for sleep . Avoid naps during the day so you can rest well at night.  . Avoid caffeine, nicotine, and alcohol close to bedtime.  Marland Kitchen Avoid large meals and strong foods like spicy dishes close to bedtime.  . Associate your bed with sleep. Avoid TV and computer right before bedtime. . Get about 10-20 minutes of sunlight each day.  . Exercise during the day. . Before bedtime, do something relaxing. Calm your thinking. . Sleep in a comfortable and relaxing area. Ideas include white noise, sound machine, and  calm music.

## 2015-02-01 ENCOUNTER — Ambulatory Visit: Payer: Self-pay

## 2015-02-21 ENCOUNTER — Encounter (HOSPITAL_COMMUNITY): Payer: Self-pay | Admitting: *Deleted

## 2015-02-21 ENCOUNTER — Emergency Department (HOSPITAL_COMMUNITY): Payer: Self-pay

## 2015-02-21 ENCOUNTER — Emergency Department (HOSPITAL_COMMUNITY)
Admission: EM | Admit: 2015-02-21 | Discharge: 2015-02-21 | Disposition: A | Discharge status: Home / Self Care | Payer: Self-pay | Attending: Emergency Medicine | Admitting: Emergency Medicine

## 2015-02-21 DIAGNOSIS — E039 Hypothyroidism, unspecified: Secondary | ICD-10-CM | POA: Insufficient documentation

## 2015-02-21 DIAGNOSIS — R5383 Other fatigue: Secondary | ICD-10-CM | POA: Insufficient documentation

## 2015-02-21 DIAGNOSIS — Z79899 Other long term (current) drug therapy: Secondary | ICD-10-CM | POA: Insufficient documentation

## 2015-02-21 DIAGNOSIS — Z720 Tobacco use: Secondary | ICD-10-CM | POA: Insufficient documentation

## 2015-02-21 DIAGNOSIS — Z8521 Personal history of malignant neoplasm of larynx: Secondary | ICD-10-CM | POA: Insufficient documentation

## 2015-02-21 DIAGNOSIS — R011 Cardiac murmur, unspecified: Secondary | ICD-10-CM | POA: Insufficient documentation

## 2015-02-21 LAB — BASIC METABOLIC PANEL
Anion gap: 7 (ref 5–15)
BUN: 20 mg/dL (ref 6–20)
CALCIUM: 9.1 mg/dL (ref 8.9–10.3)
CHLORIDE: 105 mmol/L (ref 101–111)
CO2: 28 mmol/L (ref 22–32)
Creatinine, Ser: 1.02 mg/dL (ref 0.61–1.24)
Glucose, Bld: 139 mg/dL — ABNORMAL HIGH (ref 65–99)
POTASSIUM: 3.8 mmol/L (ref 3.5–5.1)
Sodium: 140 mmol/L (ref 135–145)

## 2015-02-21 LAB — CBC
HCT: 36.6 % — ABNORMAL LOW (ref 39.0–52.0)
Hemoglobin: 12.4 g/dL — ABNORMAL LOW (ref 13.0–17.0)
MCH: 33 pg (ref 26.0–34.0)
MCHC: 33.9 g/dL (ref 30.0–36.0)
MCV: 97.3 fL (ref 78.0–100.0)
PLATELETS: 217 10*3/uL (ref 150–400)
RBC: 3.76 MIL/uL — ABNORMAL LOW (ref 4.22–5.81)
RDW: 14.9 % (ref 11.5–15.5)
WBC: 5 10*3/uL (ref 4.0–10.5)

## 2015-02-21 LAB — URINALYSIS, ROUTINE W REFLEX MICROSCOPIC
GLUCOSE, UA: NEGATIVE mg/dL
Hgb urine dipstick: NEGATIVE
Ketones, ur: NEGATIVE mg/dL
Leukocytes, UA: NEGATIVE
Nitrite: NEGATIVE
PH: 5.5 (ref 5.0–8.0)
Protein, ur: NEGATIVE mg/dL
Specific Gravity, Urine: 1.029 (ref 1.005–1.030)
Urobilinogen, UA: 1 mg/dL (ref 0.0–1.0)

## 2015-02-21 LAB — I-STAT TROPONIN, ED: Troponin i, poc: 0 ng/mL (ref 0.00–0.08)

## 2015-02-21 LAB — CBG MONITORING, ED: Glucose-Capillary: 143 mg/dL — ABNORMAL HIGH (ref 65–99)

## 2015-02-21 MED ORDER — SODIUM CHLORIDE 0.9 % IV BOLUS (SEPSIS)
1000.0000 mL | Freq: Once | INTRAVENOUS | Status: AC
  Administered 2015-02-21: 1000 mL via INTRAVENOUS

## 2015-02-21 NOTE — ED Notes (Signed)
Pt also received 8mg  Zofran from EMS en route. EMS IV 18g L AC.

## 2015-02-21 NOTE — ED Notes (Signed)
Bed: [facility] Expected date:  Expected time:  Means of arrival:  Comments: EMS-syncopal episode

## 2015-02-21 NOTE — ED Provider Notes (Signed)
CSN: [id number]     Arrival date & time 02/21/15  1318 History   First MD Initiated Contact with Patient 02/21/15 1337     Chief Complaint  Patient presents with  . Loss of Consciousness     (Consider location/radiation/quality/duration/timing/severity/associated sxs/prior Treatment) HPI [name] is a 49 y.o. male with a history of vocal cord cancer, hypothyroidism, comes in for evaluation of loss of consciousness. Per EMS, daughter found patient unresponsive on the couch at approximately 12 AM today. Daughter tried to arouse patient without success and called EMS. By the time EMS arrived, patient was alert and oriented 4, sitting up on the couch. Patient reports he has felt "exhausted" the past few days. Admits he has not been drinking a lot of fluids. He reports he was taking a nap on the couch when his daughter was trying to wake him up. He denies any associated shortness of breath, chest pain, dark or bloody stools, headache, unilateral numbness or weakness. He does report intermittently his fingers will tingle. Denies any discomfort now the ED other than feeling tired. No other aggravating or modifying factors.  Past Medical History  Diagnosis Date  . Hypothyroidism   . Heart murmur     child  . Headache(784.0)   . 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 Hx of radiation therapy 05/19/13- 06/27/13    glottic larynx, right true vocal cord 6300 cGy 28 sessions  . Shortness of breath    Past Surgical History  Procedure Laterality Date  . Tonsillectomy      as a child  . [place] teeth      age 18  . Direct laryngoscopy Right 04/24/2013    Procedure: DIRECT LARYNGOSCOPY and BIOPSY OF TONGUE;  Surgeon: Jerrell Belfast, MD;  Location: [facility];  Service: ENT;  Laterality: Right;  . Microlaryngoscopy Right 04/24/2013    Procedure: MICROLARYNGOSCOPY and RIGHT VOCAL CORD BIOPSY;  Surgeon: Jerrell Belfast, MD;  Location: [facility];  Service: ENT;  Laterality: Right;  . Multiple extractions with  alveoloplasty N/A 09/30/2013    Procedure: Extraction of tooth #'s 2,3,4,5,6,7,8,9,10,11,12,15,22,23,24,25,26,27,28 with alveoloplasty;  Surgeon: Lenn Cal, DDS;  Location[facility];  Service: Oral Surgery;  Laterality: N/A;  . Esophagogastroduodenoscopy (egd) with propofol N/A 12/30/2013    Procedure: ESOPHAGOGASTRODUODENOSCOPY (EGD) WITH PROPOFOL;  Surgeon: Garlan Fair, MD;  [facility];  Service: Endoscopy;  Laterality: N/A;  . Balloon dilation N/A 12/30/2013    Procedure: BALLOON DILATION;  Surgeon: Garlan Fair, MD;  [facility];  Service: Endoscopy;  Laterality: N/A;  . Esophageal manometry N/A 09/07/2014    Procedure: ESOPHAGEAL MANOMETRY (EM);  Surgeon: Garlan Fair, MD;  [facility];  Service: Endoscopy;  Laterality: N/A;   Family History  Problem Relation Age of Onset  . Heart attack Father   . COPD Mother   . Emphysema Mother    History  Substance Use Topics  . Smoking status: Current Every Day Smoker -- 0.20 packs/day for 34 years    Types: Cigarettes  . Smokeless tobacco: Never Used     Comment: Smokes a little less. DOWN TO 1 PACK A WEEK  . Alcohol Use: No    Review of Systems A 10 point review of systems was completed and was negative except for pertinent positives and negatives as mentioned in the history of present illness     Allergies  Fentanyl; Citalopram; and Morphine and related  Home Medications   Prior to Admission medications  Medication Sig Start Date End Date Taking? Authorizing Provider  albuterol (PROVENTIL HFA;VENTOLIN HFA) 108 (90 BASE) MCG/ACT inhaler Inhale 1-2 puffs into the lungs every 6 (six) hours as needed for wheezing or shortness of breath.   Yes Historical Provider, MD  levothyroxine (SYNTHROID, LEVOTHROID) 175 MCG tablet Take 1 tablet (175 mcg total) by mouth daily before breakfast. 11/13/14  Yes Lucious [name], DO  omeprazole (PRILOSEC) 40 MG capsule Twice a day for 1-2 weeks then once  daily Patient taking differently: Take 40 mg by mouth daily.  11/12/14  Yes Lucious [name], DO  Oxycodone HCl 10 MG TABS Take 1 tablet (10 mg total) by mouth every 6 (six) hours as needed. Patient taking differently: Take 10 mg by mouth every 6 (six) hours as needed (throat pain).  01/14/15  Yes Lucious [name], DO  sertraline (ZOLOFT) 50 MG tablet Take 50 mg by mouth daily.   Yes Alemu T Mengistu, RN  nicotine (NICODERM CQ - DOSED IN MG/24 HOURS) 14 mg/24hr patch Place 1 patch (14 mg total) onto the skin daily. Patient not taking: Reported on 01/29/2015 09/10/14   Lucious [name], DO  nicotine (NICODERM CQ - DOSED IN MG/24 HR) 7 mg/24hr patch Place 1 patch (7 mg total) onto the skin daily. Patient not taking: Reported on 01/29/2015 09/25/14   Lucious [name], DO   BP 113/68 mmHg  Pulse 75  Temp(Src) 98 F (36.7 C) (Oral)  Resp 16  SpO2 98% Physical Exam  Constitutional: He is oriented to person, place, and time. He appears well-developed and well-nourished. No distress.  HENT:  Head: Normocephalic and atraumatic.  Mouth/Throat: Oropharynx is clear and moist.  Eyes: Conjunctivae are normal. Pupils are equal, round, and reactive to light. Right eye exhibits no discharge. Left eye exhibits no discharge. No scleral icterus.  Neck: Normal range of motion. Neck supple.  Cardiovascular: Normal rate, regular rhythm and normal heart sounds.   Pulmonary/Chest: Effort normal and breath sounds normal. No respiratory distress. He has no wheezes. He has no rales.  Abdominal: Soft. There is no tenderness.  Musculoskeletal: Normal range of motion. He exhibits no edema or tenderness.  Neurological: He is alert and oriented to person, place, and time.  Cranial Nerves II-XII grossly intact. Motor and sensation 5/5 in all 4 extremities. Gait is baseline without ataxia.  Skin: Skin is warm and dry. No rash noted. He is not diaphoretic.  Psychiatric: He has a normal mood and affect.  Nursing note and vitals  reviewed.   ED Course  Procedures (including critical care time) Labs Review Labs Reviewed  BASIC METABOLIC PANEL - Abnormal; Notable for the following:    Glucose, Bld 139 (*)    All other components within normal limits  CBC - Abnormal; Notable for the following:    RBC 3.76 (*)    Hemoglobin 12.4 (*)    HCT 36.6 (*)    All other components within normal limits  URINALYSIS, ROUTINE W REFLEX MICROSCOPIC (NOT AT [facility]) - Abnormal; Notable for the following:    Color, Urine AMBER (*)    Bilirubin Urine SMALL (*)    All other components within normal limits  CBG MONITORING, ED - Abnormal; Notable for the following:    Glucose-Capillary 143 (*)    All other components within normal limits  I-STAT TROPOININ, ED    Imaging Review Dg Chest 2 View  02/21/2015   CLINICAL DATA:  Dizziness, weakness today.  EXAM: CHEST  2 VIEW  COMPARISON:  12/29/2013  FINDINGS: Mild hyperinflation. Biapical scarring. Heart and mediastinal contours are within normal limits. No focal opacities or effusions. No acute bony abnormality.  IMPRESSION: Emphysema.  No acute findings.   Electronically Signed   By: Rolm Baptise M.D.   On: 02/21/2015 14:43     EKG Interpretation None     Meds given in ED:  Medications  sodium chloride 0.9 % bolus 1,000 mL (0 mLs Intravenous Stopped 02/21/15 1600)    Discharge Medication List as of 02/21/2015  3:33 PM     Filed Vitals:   02/21/15 1326 02/21/15 1555  BP: 90/58 113/68  Pulse: 70 75  Temp: 97.7 F (36.5 C) 98 F (36.7 C)  TempSrc: Oral Oral  Resp: 18 16  SpO2: 99% 98%    MDM  Vitals stable - WNL -afebrile, blood pressure 113/68 during my evaluation. First BP reading likely erroneous. Pt resting comfortably in ED. patient states "I feel fine intermittently go home". PE--normal heart and lung exam, no edema. Labwork--labs are baseline and not concerning. Troponin negative. EKG is unchanged and reassuring. Imaging--chest x-ray shows emphysema with no  other acute cardiopulmonary pathology.  DDX--patient reports that he was just tired and was taking a nap. No overt loss of consciousness. Doubt seizure. Per [facility] syncope rule, patient is very low risk. Exam, labs and imaging  today are very reassuring. Patient given 1 L normal saline.  I discussed all relevant lab findings and imaging results with pt and they verbalized understanding. I personally reviewed the imaging and agree with the results as interpreted by the radiologist. Discussed f/u with PCP within 48 hrs and return precautions, pt very amenable to plan.  Final diagnoses:  Tired        Comer Locket, PA-C 02/21/15 [place], MD 03/01/15 [phone]

## 2015-02-21 NOTE — Discharge Instructions (Signed)
There is not appear to be an emergent cause of her symptoms at this time. Your exam, labs and chest x-ray are all reassuring. Please follow-up with your doctor for further evaluation management of your symptoms. Return to ED for new or worsening symptoms.  Fatigue Fatigue is a feeling of tiredness, lack of energy, lack of motivation, or feeling tired all the time. Having enough rest, good nutrition, and reducing stress will normally reduce fatigue. Consult your caregiver if it persists. The nature of your fatigue will help your caregiver to find out its cause. The treatment is based on the cause.  CAUSES  There are many causes for fatigue. Most of the time, fatigue can be traced to one or more of your habits or routines. Most causes fit into one or more of three general areas. They are: Lifestyle problems  Sleep disturbances.  Overwork.  Physical exertion.  Unhealthy habits.  Poor eating habits or eating disorders.  Alcohol and/or drug use .  Lack of proper nutrition (malnutrition). Psychological problems  Stress and/or anxiety problems.  Depression.  Grief.  Boredom. Medical Problems or Conditions  Anemia.  Pregnancy.  Thyroid gland problems.  Recovery from major surgery.  Continuous pain.  Emphysema or asthma that is not well controlled  Allergic conditions.  Diabetes.  Infections (such as mononucleosis).  Obesity.  Sleep disorders, such as sleep apnea.  Heart failure or other heart-related problems.  Cancer.  Kidney disease.  Liver disease.  Effects of certain medicines such as antihistamines, cough and cold remedies, prescription pain medicines, heart and blood pressure medicines, drugs used for treatment of cancer, and some antidepressants. SYMPTOMS  The symptoms of fatigue include:   Lack of energy.  Lack of drive (motivation).  Drowsiness.  Feeling of indifference to the surroundings. DIAGNOSIS  The details of how you feel help guide  your caregiver in finding out what is causing the fatigue. You will be asked about your present and past health condition. It is important to review all medicines that you take, including prescription and non-prescription items. A thorough exam will be done. You will be questioned about your feelings, habits, and normal lifestyle. Your caregiver may suggest blood tests, urine tests, or other tests to look for common medical causes of fatigue.  TREATMENT  Fatigue is treated by correcting the underlying cause. For example, if you have continuous pain or depression, treating these causes will improve how you feel. Similarly, adjusting the dose of certain medicines will help in reducing fatigue.  HOME CARE INSTRUCTIONS   Try to get the required amount of good sleep every night.  Eat a healthy and nutritious diet, and drink enough water throughout the day.  Practice ways of relaxing (including yoga or meditation).  Exercise regularly.  Make plans to change situations that cause stress. Act on those plans so that stresses decrease over time. Keep your work and personal routine reasonable.  Avoid street drugs and minimize use of alcohol.  Start taking a daily multivitamin after consulting your caregiver. SEEK MEDICAL CARE IF:   You have persistent tiredness, which cannot be accounted for.  You have fever.  You have unintentional weight loss.  You have headaches.  You have disturbed sleep throughout the night.  You are feeling sad.  You have constipation.  You have dry skin.  You have gained weight.  You are taking any new or different medicines that you suspect are causing fatigue.  You are unable to sleep at night.  You develop any unusual  swelling of your legs or other parts of your body. SEEK IMMEDIATE MEDICAL CARE IF:   You are feeling confused.  Your vision is blurred.  You feel faint or pass out.  You develop severe headache.  You develop severe abdominal, pelvic,  or back pain.  You develop chest pain, shortness of breath, or an irregular or fast heartbeat.  You are unable to pass a normal amount of urine.  You develop abnormal bleeding such as bleeding from the rectum or you vomit blood.  You have thoughts about harming yourself or committing suicide.  You are worried that you might harm someone else. MAKE SURE YOU:   Understand these instructions.  Will watch your condition.  Will get help right away if you are not doing well or get worse. Document Released: 05/07/2007 Document Revised: 10/02/2011 Document Reviewed: 11/11/2013 [facility] Patient Information 2015 [place], [place]. This information is not intended to replace advice given to you by your health care provider. Make sure you discuss any questions you have with your health care provider.

## 2015-02-21 NOTE — ED Notes (Signed)
Patient attempted to provide urine sample and was unsuccessful, patient will notify staff when able to provide sample

## 2015-02-21 NOTE — ED Notes (Signed)
Pt in from home by ems. Daughter found pt unresponsive on couch approximately 1230pm today, attempted to arouse without success and called ems. By the time ems arrived, pt was A&Ox4, sitting up on couch. Has not felt well past few days, lethargic. EMS CBG 166, NSR, 12 lead unremarkable.

## 2015-02-21 NOTE — ED Notes (Signed)
Unable to collect labs at this time patient is using the restroom.

## 2015-03-10 ENCOUNTER — Telehealth: Payer: Self-pay | Admitting: Internal Medicine

## 2015-03-10 NOTE — Telephone Encounter (Signed)
Call to patient to confirm appointment for 03/11/15 at 1:45 phone is not available

## 2015-03-11 ENCOUNTER — Encounter: Payer: Self-pay | Admitting: Internal Medicine

## 2015-03-11 ENCOUNTER — Ambulatory Visit (INDEPENDENT_AMBULATORY_CARE_PROVIDER_SITE_OTHER): Payer: Self-pay | Admitting: Internal Medicine

## 2015-03-11 VITALS — BP 121/72 | HR 72 | Temp 98.0°F | Ht 71.0 in | Wt 140.0 lb

## 2015-03-11 DIAGNOSIS — F17200 Nicotine dependence, unspecified, uncomplicated: Secondary | ICD-10-CM

## 2015-03-11 DIAGNOSIS — Z79899 Other long term (current) drug therapy: Secondary | ICD-10-CM

## 2015-03-11 DIAGNOSIS — E038 Other specified hypothyroidism: Secondary | ICD-10-CM

## 2015-03-11 DIAGNOSIS — Z720 Tobacco use: Secondary | ICD-10-CM

## 2015-03-11 DIAGNOSIS — E034 Atrophy of thyroid (acquired): Secondary | ICD-10-CM

## 2015-03-11 DIAGNOSIS — R070 Pain in throat: Secondary | ICD-10-CM

## 2015-03-11 DIAGNOSIS — C320 Malignant neoplasm of glottis: Secondary | ICD-10-CM

## 2015-03-11 DIAGNOSIS — Z0289 Encounter for other administrative examinations: Secondary | ICD-10-CM

## 2015-03-11 MED ORDER — OXYCODONE HCL 10 MG PO TABS: 10.0000 mg | ORAL_TABLET | Freq: Four times a day (QID) | ORAL | Status: DC | PRN

## 2015-03-11 NOTE — Progress Notes (Signed)
[facility] Subjective:   Patient ID: [name] male   DOB: June 09, 1966 49 y.o.   MRN: [id number]  HPI: [name] is a 49 y.o. male with a PMH detailed below who presents for 2 month follow up of his hypothyroidism and chronic throat pain.  Please see A&P below for further HPI details.    Past Medical History  Diagnosis Date  . Hypothyroidism   . Heart murmur     child  . Headache(784.0)   . 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 Hx of radiation therapy 05/19/13- 06/27/13    glottic larynx, right true vocal cord 6300 cGy 28 sessions  . Shortness of breath    Current Outpatient Prescriptions  Medication Sig Dispense Refill  . albuterol (PROVENTIL HFA;VENTOLIN HFA) 108 (90 BASE) MCG/ACT inhaler Inhale 1-2 puffs into the lungs every 6 (six) hours as needed for wheezing or shortness of breath.    . levothyroxine (SYNTHROID, LEVOTHROID) 175 MCG tablet Take 1 tablet (175 mcg total) by mouth daily before breakfast. 30 tablet 5  . nicotine (NICODERM CQ - DOSED IN MG/24 HOURS) 14 mg/24hr patch Place 1 patch (14 mg total) onto the skin daily. (Patient not taking: Reported on 01/29/2015) 14 patch 0  . nicotine (NICODERM CQ - DOSED IN MG/24 HR) 7 mg/24hr patch Place 1 patch (7 mg total) onto the skin daily. (Patient not taking: Reported on 01/29/2015) 14 patch 1  . omeprazole (PRILOSEC) 40 MG capsule Twice a day for 1-2 weeks then once daily (Patient taking differently: Take 40 mg by mouth daily. ) 60 capsule 5  . Oxycodone HCl 10 MG TABS Take 1 tablet (10 mg total) by mouth every 6 (six) hours as needed. 90 tablet 0  . sertraline (ZOLOFT) 50 MG tablet Take 50 mg by mouth daily.     No current facility-administered medications for this visit.   Family History  Problem Relation Age of Onset  . Heart attack Father   . COPD Mother   . Emphysema Mother    Social History   Social History  . Marital Status: Legally Separated    Spouse Name: N/A  . Number of Children:  3  . Years of Education: N/A   Occupational History  .  [employer]   Social History Main Topics  . Smoking status: Current Every Day Smoker -- 0.20 packs/day for 34 years    Types: Cigarettes  . Smokeless tobacco: Never Used     Comment: Smokes a little less. DOWN TO 1 PACK A WEEK  . Alcohol Use: No  . Drug Use: No  . Sexual Activity: Not Asked   Other Topics Concern  . None   Social History Narrative   The patient is married. Patient has 3 children. 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   The patient has a history of smoking 2 packs of cigarette a day for 34 years. Patient current is trying to quit smoking. Patient denies use of alcohol. Patient denies use of other illicit drugs.   Review of Systems: Review of Systems  Constitutional: Positive for malaise/fatigue. Negative for fever, chills and weight loss.  HENT: Positive for sore throat.   Respiratory: Negative for cough and shortness of breath.   Cardiovascular: Negative for chest pain and palpitations.  Gastrointestinal: Negative for heartburn and abdominal pain.  Genitourinary: Negative for dysuria.  Musculoskeletal: Negative for myalgias.  Neurological: Negative for dizziness and headaches.  Psychiatric/Behavioral: Negative for depression and substance abuse. The patient has insomnia.      Objective:  Physical Exam: Filed Vitals:   03/11/15 1356  BP: 121/72  Pulse: 72  Temp: 98 F (36.7 C)  TempSrc: Oral  Height: 5' 11"  (1.803 m)  Weight: 140 lb (63.504 kg)  SpO2: 100%  Physical Exam  Constitutional: No distress.  thin  HENT:  Head: Normocephalic and atraumatic.  Mouth/Throat: Oropharynx is clear and moist.  Neck: Neck supple. No thyromegaly (no appreciated thyroid nodules) present.  Cardiovascular: Normal rate and regular rhythm.   Pulmonary/Chest: Effort normal and breath sounds normal.  Abdominal: Bowel sounds are normal.  Musculoskeletal: He exhibits no edema.   Lymphadenopathy:    He has no cervical adenopathy.  Nursing note and vitals reviewed.   Assessment & Plan:  Case discussed with Dr. Linna Hoff carcinoma -He has follow up scheduled with Dr Isidore Moos.  So far for his throat pain he has been reevaluated by GI, ENT, and Rad Onc who have not seen any signs of recurrence.  He notes his pain is controlled with the oxycodone which helps him to tolerate oral intake and swallowing.  I still would like a better explantation about why he is having such difficulty.  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 -Will refill 2 months of Oxycodone and reevaluate in 2 months pending results of U/S.  Hypothyroidism - Today he reports increase fatigue and weakness c/w hypothyroidism.  Oddly he report he has now been strictly compliant with the synthroid and can relay the dosing instructions to me.  I am somewhat confused if he truly remains hypothyroid despite an appropriate dose of synthroid for his weight.  If TSH not improving will likely need endocrine consult. -Repeat TSH >>>> has actually come back much improved to 17.  As it has only been 2 months I am hesitant to change his dose as his TSH may continue to normalize.   -Will also check ESR to help evaluate for thyroiditis.  Tobacco use disorder I have continued to stress the importance of smoking cessation, spent at least 5 minutes discussing this with him today.    Medications Ordered Meds ordered this encounter  Medications  . DISCONTD: Oxycodone HCl 10 MG TABS    Sig: Take 1 tablet (10 mg total) by mouth every 6 (six) hours as needed.    Dispense:  90 tablet    Refill:  0    Rx 1/2  . Oxycodone HCl 10 MG TABS    Sig: Take 1 tablet (10 mg total) by mouth every 6 (six) hours as needed.    Dispense:  90 tablet    Refill:  0    Rx 2/2 May be filled 30 days after print date    Other Orders Orders Placed This Encounter  Procedures  . 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    Answer:  [facility]  . Prescription Abuse Monitoring, 17 Panel  . TSH  . Sed  Rate (ESR)   Follow Up: Return in about 2 months (around 05/11/2015).

## 2015-03-11 NOTE — Patient Instructions (Signed)
General Instructions: I will call with the results of your lab test.  Please bring your medicines with you each time you come to clinic.  Medicines may include prescription medications, over-the-counter medications, herbal remedies, eye drops, vitamins, or other pills.   Progress Toward Treatment Goals:  No flowsheet data found.  Self Care Goals & Plans:  Self Care Goal 03/11/2015  Manage my medications take my medicines as prescribed; bring my medications to every visit; refill my medications on time  Eat healthy foods -  Stop smoking (No Data)    No flowsheet data found.   Care Management & Community Referrals:  No flowsheet data found.

## 2015-03-11 NOTE — Progress Notes (Signed)
Internal Medicine Clinic Attending  Case discussed with Dr. Hoffman at the time of the visit.  We reviewed the resident's history and exam and pertinent patient test results.  I agree with the assessment, diagnosis, and plan of care documented in the resident's note.  

## 2015-03-12 LAB — SEDIMENTATION RATE: SED RATE: 2 mm/h (ref 0–30)

## 2015-03-12 LAB — TSH: TSH: 17.79 u[IU]/mL — ABNORMAL HIGH (ref 0.450–4.500)

## 2015-03-14 NOTE — Assessment & Plan Note (Addendum)
-  Today he reports increase fatigue and weakness c/w hypothyroidism.  Oddly he report he has now been strictly compliant with the synthroid and can relay the dosing instructions to me.  I am somewhat confused if he truly remains hypothyroid despite an appropriate dose of synthroid for his weight.  If TSH not improving will likely need endocrine consult. -Repeat TSH >>>> has actually come back much improved to 17.  As it has only been 2 months I am hesitant to change his dose as his TSH may continue to normalize.   -Will also check ESR to help evaluate for thyroiditis.

## 2015-03-14 NOTE — Assessment & Plan Note (Signed)
-  He has follow up scheduled with Dr Isidore Moos.  So far for his throat pain he has been reevaluated by GI, ENT, and Rad Onc who have not seen any signs of recurrence.  He notes his pain is controlled with the oxycodone which helps him to tolerate oral intake and swallowing.  I still would like a better explantation about why he is having such difficulty.  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 -Will refill 2 months of Oxycodone and reevaluate in 2 months pending results of U/S.

## 2015-03-14 NOTE — Assessment & Plan Note (Signed)
I have continued to stress the importance of smoking cessation, spent at least 5 minutes discussing this with him today.

## 2015-03-16 LAB — PRESCRIPTION ABUSE MONITORING 17P, URINE
6-ACETYLMORPHINE, URINE: NEGATIVE ng/mL
AMPHETAMINE SCREEN URINE: NEGATIVE ng/mL
BARBITURATE SCREEN URINE: NEGATIVE ng/mL
BENZODIAZEPINE SCREEN, URINE: NEGATIVE ng/mL
Buprenorphine, Urine: NEGATIVE ng/mL
CANNABINOIDS UR QL SCN: NEGATIVE ng/mL
Carisoprodol/Meprobamate, Ur: NEGATIVE ng/mL
Cocaine (Metab) Scrn, Ur: NEGATIVE ng/mL
Creatinine(Crt), U: 104 mg/dL (ref 20.0–300.0)
EDDP, URINE: NEGATIVE ng/mL
Fentanyl, Urine: NEGATIVE pg/mL
MDMA SCREEN, URINE: NEGATIVE ng/mL
METHADONE SCREEN, URINE: NEGATIVE ng/mL
Meperidine Screen, Urine: NEGATIVE ng/mL
Nitrite Urine, Quantitative: NEGATIVE ug/mL
PH UR, DRUG SCRN: 6.3 (ref 4.5–8.9)
PROPOXYPHENE SCREEN URINE: NEGATIVE ng/mL
Phencyclidine Qn, Ur: NEGATIVE ng/mL
SPECIFIC GRAVITY: 1.007
TRAMADOL SCREEN, URINE: NEGATIVE ng/mL
Tapentadol, Urine: NEGATIVE ng/mL

## 2015-03-16 LAB — OPIATES CONFIRMATION, URINE: Opiates: NEGATIVE ng/mL

## 2015-03-16 LAB — OXYCODONE/OXYMORPHONE CONFIRM
OXYCODONE/OXYMORPH: POSITIVE — AB
OXYCODONE: 1840 ng/mL
OXYCODONE: POSITIVE — AB
OXYMORPHONE CONFIRM: 664 ng/mL
OXYMORPHONE: POSITIVE — AB

## 2015-03-31 ENCOUNTER — Ambulatory Visit (HOSPITAL_COMMUNITY): Payer: Self-pay

## 2015-05-11 ENCOUNTER — Other Ambulatory Visit: Payer: Self-pay | Admitting: Internal Medicine

## 2015-05-11 DIAGNOSIS — C320 Malignant neoplasm of glottis: Secondary | ICD-10-CM

## 2015-05-11 NOTE — Telephone Encounter (Signed)
Patient states he is out of his pain medication (oxycodone) and would like a refill.   Patient is @ work.

## 2015-05-12 MED ORDER — OXYCODONE HCL 10 MG PO TABS: 10.0000 mg | ORAL_TABLET | Freq: Four times a day (QID) | ORAL | Status: DC | PRN

## 2015-05-12 NOTE — Telephone Encounter (Signed)
Pt has appt 10/20 Last uds 8/18 Last script given 8/18, 2 scripts given

## 2015-05-12 NOTE — Telephone Encounter (Signed)
Tried to call pt, got vmail stating he is unavailable

## 2015-05-13 ENCOUNTER — Ambulatory Visit (INDEPENDENT_AMBULATORY_CARE_PROVIDER_SITE_OTHER): Payer: Self-pay | Admitting: Internal Medicine

## 2015-05-13 VITALS — BP 125/79 | HR 79 | Temp 97.7°F | Wt 143.7 lb

## 2015-05-13 DIAGNOSIS — C320 Malignant neoplasm of glottis: Secondary | ICD-10-CM

## 2015-05-13 DIAGNOSIS — E038 Other specified hypothyroidism: Secondary | ICD-10-CM

## 2015-05-13 DIAGNOSIS — R1314 Dysphagia, pharyngoesophageal phase: Secondary | ICD-10-CM

## 2015-05-13 DIAGNOSIS — R1310 Dysphagia, unspecified: Secondary | ICD-10-CM

## 2015-05-13 DIAGNOSIS — R1319 Other dysphagia: Secondary | ICD-10-CM

## 2015-05-13 DIAGNOSIS — E034 Atrophy of thyroid (acquired): Secondary | ICD-10-CM

## 2015-05-13 DIAGNOSIS — F17200 Nicotine dependence, unspecified, uncomplicated: Secondary | ICD-10-CM

## 2015-05-13 DIAGNOSIS — F17210 Nicotine dependence, cigarettes, uncomplicated: Secondary | ICD-10-CM

## 2015-05-13 MED ORDER — NIFEDIPINE ER OSMOTIC RELEASE 30 MG PO TB24: 30.0000 mg | ORAL_TABLET | Freq: Every day | ORAL | Status: DC

## 2015-05-13 NOTE — Addendum Note (Signed)
Addended by: Marcelino Duster on: 05/13/2015 02:24 PM   Modules accepted: Orders

## 2015-05-13 NOTE — Patient Instructions (Signed)
General Instructions:  I want you to make an appointment with Dr Wynetta Emery for follow up.  Please cut down on your use of oxycodone.  Please bring your medicines with you each time you come to clinic.  Medicines may include prescription medications, over-the-counter medications, herbal remedies, eye drops, vitamins, or other pills.   Progress Toward Treatment Goals:  No flowsheet data found.  Self Care Goals & Plans:  Self Care Goal 03/11/2015  Manage my medications take my medicines as prescribed; bring my medications to every visit; refill my medications on time  Eat healthy foods -  Stop smoking (No Data)    No flowsheet data found.   Care Management & Community Referrals:  No flowsheet data found.    Nifedipine extended-release tablets What is this medicine? NIFEDIPINE (nye FED i peen) is a calcium-channel blocker. It affects the amount of calcium found in your heart and muscle cells. This relaxes your blood vessels, which can reduce the amount of work the heart has to do. This medicine is used to treat high blood pressure and chest pain caused by angina. This medicine may be used for other purposes; ask your health care provider or pharmacist if you have questions. What should I tell my health care provider before I take this medicine? They need to know if you have any of these conditions: -heart problems, low blood pressure, slow or irregular heartbeat -kidney disease -liver disease -previous heart attack -stomach or intestine problems -an unusual or allergic reaction to nifedipine, other medicines, foods, dyes, or preservatives -pregnant or trying to get pregnant -breast-feeding How should I use this medicine? Take this medicine by mouth with a glass of water. Follow the directions on the prescription label. Do not cut, crush or chew. Take your doses at regular intervals. Do not take your medicine more often then directed. Do not suddenly stop taking this medicine. Your  doctor will tell you how much medicine to take. If your doctor wants you to stop the medicine, the dose will be slowly lowered over time to avoid any side effects. Talk to your pediatrician regarding the use of this medicine in children. Special care may be needed. Overdosage: If you think you have taken too much of this medicine contact a poison control center or emergency room at once. NOTE: This medicine is only for you. Do not share this medicine with others. What if I miss a dose? If you miss a dose, take it as soon as you can. If it is almost time for your next dose, take only that dose. Do not take double or extra doses. What may interact with this medicine? Do not take this medicine with any of the following medications: -certain medicines for seizures like carbamazepine, phenobarbital, phenytoin -lumacaftor; ivacaftor -rifabutin -rifampin -rifapentine -St. John's Wort This medicine may also interact with the following medications: -antiviral medicines for HIV or AIDS -certain medicines for blood pressure -certain medicines for diabetes -certain medicines for erectile dysfunction -certain medicines for fungal infections like ketoconazole, fluconazole, and itraconazole -certain medicines for irregular heart beat like flecainide and quinidine -certain medicines that treat or prevent blood clots like warfarin -clarithromycin -digoxin -dolasetron -erythromycin -fluoxetine -grapefruit juice -local or general anesthetics -nefazodone -orlistat -quinupristin; dalfopristin -sirolimus -stomach acid blockers like cimetidine, ranitidine, omeprazole, or pantoprazole -tacrolimus -valproic acid This list may not describe all possible interactions. Give your health care provider a list of all the medicines, herbs, non-prescription drugs, or dietary supplements you use. Also tell them if you smoke,  drink alcohol, or use illegal drugs. Some items may interact with your medicine. What  should I watch for while using this medicine? Visit your doctor or health care professional for regular check ups. Check your blood pressure and pulse rate regularly. Ask your doctor or health care professional what your blood pressure and pulse rate should be and when you should contact him or her. You may get drowsy or dizzy. Do not drive, use machinery, or do anything that needs mental alertness until you know how this medicine affects you. Do not stand or sit up quickly, especially if you are an older patient. This reduces the risk of dizzy or fainting spells. Alcohol may interfere with the effect of this medicine. Avoid alcoholic drinks. If you are taking Procardia XL, you may notice the empty shell of the tablet in your stool. What side effects may I notice from receiving this medicine? Side effects that you should report to your doctor or health care professional as soon as possible: -blood in the urine -difficulty breathing -fast heartbeat, palpitations, irregular heartbeat, chest pain -redness, blistering, peeling or loosening of the skin, including inside the mouth -reduced amount of urine passed -skin rash -swelling of the legs and ankles Side effects that usually do not require medical attention (report to your doctor or health care professional if they continue or are bothersome): -constipation -facial flushing -headache -weakness or tiredness This list may not describe all possible side effects. Call your doctor for medical advice about side effects. You may report side effects to FDA at 1-800-FDA-1088. Where should I keep my medicine? Keep out of the reach of children. Store at room temperature below 30 degrees C (86 degrees F). Protect from moisture and humidity. Keep container tightly closed. Throw away any unused medicine after the expiration date. NOTE: This sheet is a summary. It may not cover all possible information. If you have questions about this medicine, talk to your  doctor, pharmacist, or health care provider.    2016, [employer]vier/[employer] Standard. (2014-09-14 10:25:09)

## 2015-05-13 NOTE — Assessment & Plan Note (Addendum)
He continues to have some worsening of his dysphagia.  He reports that he is now having trouble swallowing his food and the sensation that food is stuck multiple times a week.  He occasionally spits up the food. Looking back at his records I do not see the full note from Dr Durenda Age last visit and will try to obtain that.  This was concern for type 3 achalasia before the esophageal manometry which per dr Durenda Age note could be caused by the narcotic pain medication.  He has now been treated with a PPI for multiple months without improvement.  I have reviewed his manometry myself and it is concerning for type 3 achalasia but and I will try to get the official read from Dr Durenda Age office.  I have discussed this extensively with [name] and that it seems to risks from the narcotics currently over weight the benefits and have told him that we need to get him off the medication as I suspect that he may be in a cycle that the throat pain he is having is paradoxally caused by his pain killer medication.  -Will obtain records from Dr Wynetta Emery - Work to discontinue oxycodone>> wean to off within the next 2 months. - There is some possibility that [facility] may provide some relief and [name] is interested.  Will start trial of Nifedipine XR, 30mg  once a day.  ADDENDUM: I have reviewed the report of his manometry and IT DOES NOT show achalasia.  Given cost concerns and no clear diagnosis at this point I do not feel that nifedipine will be effective and will discontinue.

## 2015-05-13 NOTE — Progress Notes (Addendum)
[facility] Subjective:   Patient ID: [name] male   DOB: Jun 17, 1966 49 y.o.   MRN: [id number]  HPI: [name] is a 49 y.o. 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  See problem based a/p below for the status of his chronic medical problems.    Past Medical History  Diagnosis Date  . Hypothyroidism   . Heart murmur     child  . Headache(784.0)   . 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 Hx of radiation therapy 05/19/13- 06/27/13    glottic larynx, right true vocal cord 6300 cGy 28 sessions  . Shortness of breath    Current Outpatient Prescriptions  Medication Sig Dispense Refill  . albuterol (PROVENTIL HFA;VENTOLIN HFA) 108 (90 BASE) MCG/ACT inhaler Inhale 1-2 puffs into the lungs every 6 (six) hours as needed for wheezing or shortness of breath.    . levothyroxine (SYNTHROID, LEVOTHROID) 175 MCG tablet Take 1 tablet (175 mcg total) by mouth daily before breakfast. 30 tablet 5  . nicotine (NICODERM CQ - DOSED IN MG/24 HOURS) 14 mg/24hr patch Place 1 patch (14 mg total) onto the skin daily. (Patient not taking: Reported on 01/29/2015) 14 patch 0  . nicotine (NICODERM CQ - DOSED IN MG/24 HR) 7 mg/24hr patch Place 1 patch (7 mg total) onto the skin daily. (Patient not taking: Reported on 01/29/2015) 14 patch 1  . omeprazole (PRILOSEC) 40 MG capsule Twice a day for 1-2 weeks then once daily (Patient taking differently: Take 40 mg by mouth daily. ) 60 capsule 5  . Oxycodone HCl 10 MG TABS Take 1 tablet (10 mg total) by mouth every 6 (six) hours as needed. 90 tablet 0  . sertraline (ZOLOFT) 50 MG tablet Take 50 mg by mouth daily.     No current facility-administered medications for this visit.   Family History  Problem Relation Whitehead of Onset  . Heart attack Father   . COPD Mother   . Emphysema Mother    Social History   Social History  . Marital Status:  Legally Separated    Spouse Name: N/A  . Number of Children: 3  . Years of Education: N/A   Occupational History  .  [employer]   Social History Main Topics  . Smoking status: Current Every Day Smoker -- 0.20 packs/day for 34 years    Types: Cigarettes  . Smokeless tobacco: Never Used     Comment: Smokes a little less. DOWN TO 1 PACK A WEEK  . Alcohol Use: No  . Drug Use: No  . Sexual Activity: Not on file   Other Topics Concern  . Not on file   Social History Narrative   The patient is married. Patient has 3 children. 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   The patient has a history of smoking 2 packs of cigarette a day for 34 years. Patient current is trying to quit smoking. Patient denies use of alcohol. Patient denies use of other illicit drugs.   Review of Systems: Review of Systems  Constitutional: Negative for weight loss.  HENT: Positive for sore throat.   Respiratory: Negative for shortness of breath.   Cardiovascular: Negative for chest pain.  Gastrointestinal: Negative for abdominal pain.     Objective:  Physical Exam: Filed Vitals:   05/13/15 1321  BP:  125/79  Pulse: 79  Temp: 97.7 F (36.5 C)  TempSrc: Oral  Weight: 143 lb 11.2 oz (65.182 kg)  SpO2: 100%   Physical Exam  Constitutional: He is well-developed, well-nourished, and in no distress.  HENT:  Mouth/Throat: Oropharynx is clear and moist. No oropharyngeal exudate.  Neck: Neck supple. No tracheal deviation present.  Cardiovascular: Normal rate, regular rhythm and normal heart sounds.   Pulmonary/Chest: Effort normal and breath sounds normal. He has no wheezes.  Abdominal: Soft. Bowel sounds are normal.  Nursing note and vitals reviewed.   Assessment & Plan:  Case discussed with Dr. Dareen Piano  Esophageal dysphagia He continues to have some worsening of his dysphagia.  He reports that he is now having trouble swallowing his food and the sensation that food is  stuck multiple times a week.  He occasionally spits up the food. Looking back at his records I do not see the full note from Dr Durenda Whitehead last visit and will try to obtain that.  This was concern for type 3 achalasia before the esophageal manometry which per dr Durenda Whitehead note could be caused by the narcotic pain medication.  He has now been treated with a PPI for multiple months without improvement.  I have reviewed his manometry myself and it is concerning for type 3 achalasia but and I will try to get the official read from Dr Durenda Whitehead office.  I have discussed this extensively with [name] and that it seems to risks from the narcotics currently over weight the benefits and have told him that we need to get him off the medication as I suspect that he may be in a cycle that the throat pain he is having is paradoxally caused by his pain killer medication.  -Will obtain records from Dr Wynetta Whitehead - Work to discontinue oxycodone>> wean to off within the next 2 months. - There is some possibility that [facility] may provide some relief and [name] is interested.  Will start trial of Nifedipine XR, 30mg  once a day.  ADDENDUM: I have reviewed the report of his manometry and IT DOES NOT show achalasia.  Given cost concerns and no clear diagnosis at this point I do not feel that nifedipine will be effective and will discontinue.  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  He has not had any sign of recurrence of his carcinoma and likely the narcotic medication is causing a very concerning problem: type 3 achalasia.  I have told him the importance of discontinuing the oxycodone.  He has already been provided a 1 month refill of oxycodone from the refill request yesterday.  I have told him that I would like to wean this down to off over the next two months.  He is unsure if he can do this as he fells the pain medication is the only thing that actually helps his swallowing.   -Need to  monitor symptoms of weight loss, sore throat and dysphasia.  Hypothyroidism He continue to report faituge, his weight has remained stable.  He does report the is taking synthroid 129mcg as directed.  He did not complete the U/S that was ordered at this last visit.  However since his TSH had improved and sed rate was normal I told him he likely did not need it and we can cancel the order.   -Repeat TSH  ** Unfortunately his TSH once again has come back elevated at 117.  The last time this happened we obtained pharmacy  records and found that he had not been filling his medication.  I was unfortunately unable to reach him after the visit by phone.  I will ask Dr. Maudie Mercury, our clinical pharmacist to once again help to assess if he is able to get his medication and is taking it appropriately.  He should be on an adequate dose for his body weight and the most logical explanation of his wide variance of TSH values is non compliance.  Ultimately if we are not able to stabilize this he may need referral to endocrinology although this will be difficult given his limited finances.  Wt Readings from Last 5 Encounters:  05/13/15 143 lb 11.2 oz (65.182 kg)  03/11/15 140 lb (63.504 kg)  01/14/15 147 lb 8 oz (66.906 kg)  11/12/14 140 lb 9.6 oz (63.776 kg)  09/10/14 144 lb 9.6 oz (65.59 kg)   ADDENDUM: After speaking with pharmacy who has contacted [name], he has had trouble affording his synthroid.  He has been charged about $66 dollar a month.  We will switch his pharmacy to [facility] as the cost should be reduced to 4 dollars a month.  Will recheck TSH in 3-6 months   Tobacco use disorder He continues to smoke.  We once again discussed the importance of smoking cessation.   I spent 45 minutes during this visit face to face with Leslie. Whitehead and grater than 50% of this was counseling and coordination of care.  Medications Ordered Meds ordered this encounter  Medications  . NIFEdipine  (PROCARDIA-XL/ADALAT-CC/NIFEDICAL-XL) 30 MG 24 hr tablet    Sig: Take 1 tablet (30 mg total) by mouth daily.    Dispense:  30 tablet    Refill:  1   Other Orders Orders Placed This Encounter  Procedures  . TSH   Follow Up: Return in about 2 months (around 07/13/2015).

## 2015-05-14 ENCOUNTER — Telehealth: Payer: Self-pay | Admitting: Pharmacist

## 2015-05-14 LAB — TSH: TSH: 117.7 u[IU]/mL — ABNORMAL HIGH (ref 0.450–4.500)

## 2015-05-14 NOTE — Telephone Encounter (Signed)
Hypothyroidism: [facility] last and only fill date for Levothyroxine 175 mcg was 01/27/15 #30.  Attempted to call pt and ask about adherence but unable to reach pt.  Will f/u on Monday.

## 2015-05-14 NOTE — Assessment & Plan Note (Signed)
Will need to focus on discontinuation of his oxycodone.  He has not had any sign of recurrence of his carcinoma and likely the narcotic medication is causing a very concerning problem: type 3 achalasia.  I have told him the importance of discontinuing the oxycodone.  He has already been provided a 1 month refill of oxycodone from the refill request yesterday.  I have told him that I would like to wean this down to off over the next two months.  He is unsure if he can do this as he fells the pain medication is the only thing that actually helps his swallowing.   -Need to monitor symptoms of weight loss, sore throat and dysphasia.

## 2015-05-14 NOTE — Assessment & Plan Note (Signed)
He continues to smoke.  We once again discussed the importance of smoking cessation.

## 2015-05-14 NOTE — Progress Notes (Signed)
Internal Medicine Clinic Attending  Case discussed with Dr. Hoffman soon after the resident saw the patient.  We reviewed the resident's history and exam and pertinent patient test results.  I agree with the assessment, diagnosis, and plan of care documented in the resident's note. 

## 2015-05-14 NOTE — Assessment & Plan Note (Addendum)
He continue to report faituge, his weight has remained stable.  He does report the is taking synthroid 169mcg as directed.  He did not complete the U/S that was ordered at this last visit.  However since his TSH had improved and sed rate was normal I told him he likely did not need it and we can cancel the order.   -Repeat TSH  ** Unfortunately his TSH once again has come back elevated at 117.  The last time this happened we obtained pharmacy records and found that he had not been filling his medication.  I was unfortunately unable to reach him after the visit by phone.  I will ask Dr. Maudie Mercury, our clinical pharmacist to once again help to assess if he is able to get his medication and is taking it appropriately.  He should be on an adequate dose for his body weight and the most logical explanation of his wide variance of TSH values is non compliance.  Ultimately if we are not able to stabilize this he may need referral to endocrinology although this will be difficult given his limited finances.  Wt Readings from Last 5 Encounters:  05/13/15 143 lb 11.2 oz (65.182 kg)  03/11/15 140 lb (63.504 kg)  01/14/15 147 lb 8 oz (66.906 kg)  11/12/14 140 lb 9.6 oz (63.776 kg)  09/10/14 144 lb 9.6 oz (65.59 kg)   ADDENDUM: After speaking with pharmacy who has contacted Mr frasier, he has had trouble affording his synthroid.  He has been charged about $66 dollar a month.  We will switch his pharmacy to [facility] as the cost should be reduced to 4 dollars a month.  Will recheck TSH in 3-6 months

## 2015-05-20 ENCOUNTER — Telehealth: Payer: Self-pay | Admitting: Pharmacist

## 2015-05-20 DIAGNOSIS — R1310 Dysphagia, unspecified: Secondary | ICD-10-CM

## 2015-05-20 DIAGNOSIS — E038 Other specified hypothyroidism: Secondary | ICD-10-CM

## 2015-05-20 DIAGNOSIS — C320 Malignant neoplasm of glottis: Secondary | ICD-10-CM

## 2015-05-20 DIAGNOSIS — R1319 Other dysphagia: Secondary | ICD-10-CM

## 2015-05-20 MED ORDER — NIFEDIPINE ER OSMOTIC RELEASE 30 MG PO TB24: 30.0000 mg | ORAL_TABLET | Freq: Every day | ORAL | Status: DC

## 2015-05-20 MED ORDER — LEVOTHYROXINE SODIUM 175 MCG PO TABS: 175.0000 ug | ORAL_TABLET | Freq: Every day | ORAL | Status: DC

## 2015-05-20 NOTE — Telephone Encounter (Addendum)
S:    [name] is a 49 y.o. male  Contacted patient for help with levothyroxine management and he does report partial adherence. [facility] pharmacy records indicate that patient had not picked up levothyroxine since July 2016 for 30 days supply.  Current regimen: levothyroxine 175 mcg  Patient reported s/sx of hypothyroidism as noted in previous visit with PCP.  O:  BP Readings from Last 3 Encounters:  05/13/15 125/79  03/11/15 121/72  02/21/15 113/68   HR in 70s  Pertinent labs:    Component Value Date/Time   TSH 117.700* 05/13/2015 1422   TSH 30.005* 09/04/2014 1455   A/P: Subtherapeutic due to poor adherence. Patient was educated about levothyroxine including importance of adherence, how to manage missed/doubled doses, take on empty stomach, and drug interactions.  Adherence barrier identified was cost, patient able to obtain for $4 at [facility] so Rx sent for levothyroxine (contacted [facility] to cancel Rx). Patient also requested Rx for nifedipine to be sent to [facility], so Rx sent (contacted [facility] pharmacy to cancel Rx).  Addendum---clarified nifedipine should be discontinued per PCP. Contacted [facility] to d/c nifedipine   [name]

## 2015-05-20 NOTE — Addendum Note (Signed)
Addended by: Joni Reining C on: 05/20/2015 01:31 PM   Modules accepted: Medications

## 2015-05-21 MED ORDER — LEVOTHYROXINE SODIUM 175 MCG PO TABS: 175.0000 ug | ORAL_TABLET | Freq: Every day | ORAL | Status: DC

## 2015-05-21 NOTE — Addendum Note (Signed)
Addended by: Forde Dandy on: 05/21/2015 11:49 AM   Modules accepted: Orders, Medications

## 2015-06-07 ENCOUNTER — Other Ambulatory Visit: Payer: Self-pay | Admitting: Internal Medicine

## 2015-06-07 NOTE — Telephone Encounter (Signed)
Pt requesting blood pressure med to be filled @ [facility].

## 2015-06-08 ENCOUNTER — Telehealth: Payer: Self-pay | Admitting: *Deleted

## 2015-06-08 NOTE — Telephone Encounter (Signed)
Dr Heber [place] will you please call pt and explain the nifedepine being stopped, thanks

## 2015-06-08 NOTE — Telephone Encounter (Signed)
Per 10/20 note : ADDENDUM: I have reviewed the report of his manometry and IT DOES NOT show achalasia. Given cost concerns and no clear diagnosis at this point I do not feel that nifedipine will be effective and will discontinue.

## 2015-06-08 NOTE — Telephone Encounter (Signed)
Called, no answer (I have rarely been able to get him to answer on his number) I left a message that I will try to call back during regular hours in the next few days

## 2015-06-08 NOTE — Telephone Encounter (Signed)
Dr Heber [place] is on nights, pt has called and states he is out of medication, will you approve refill

## 2015-06-08 NOTE — Telephone Encounter (Signed)
I agree with Dr Lynnae January, this medication was supposed to be discontinued soon after it was prescribed.

## 2015-06-11 ENCOUNTER — Other Ambulatory Visit: Payer: Self-pay

## 2015-06-11 DIAGNOSIS — C320 Malignant neoplasm of glottis: Secondary | ICD-10-CM

## 2015-06-11 MED ORDER — OXYCODONE HCL 10 MG PO TABS: 10.0000 mg | ORAL_TABLET | Freq: Four times a day (QID) | ORAL | Status: DC | PRN

## 2015-06-11 NOTE — Telephone Encounter (Signed)
Last refill 10/19 Last office visit 10/20 Last UDS 8/18 Future apt scheduled 12/22

## 2015-06-14 ENCOUNTER — Telehealth: Payer: Self-pay | Admitting: Internal Medicine

## 2015-06-14 NOTE — Telephone Encounter (Signed)
rx is ready for pick up, called pt back to inform

## 2015-06-14 NOTE — Telephone Encounter (Signed)
Pt requesting oxycodone to be filled. °

## 2015-06-16 ENCOUNTER — Ambulatory Visit: Payer: Self-pay

## 2015-06-21 ENCOUNTER — Ambulatory Visit: Payer: Self-pay

## 2015-06-23 ENCOUNTER — Ambulatory Visit: Payer: Self-pay

## 2015-07-15 ENCOUNTER — Ambulatory Visit (INDEPENDENT_AMBULATORY_CARE_PROVIDER_SITE_OTHER): Payer: Self-pay | Admitting: Internal Medicine

## 2015-07-15 ENCOUNTER — Encounter: Payer: Self-pay | Admitting: Internal Medicine

## 2015-07-15 VITALS — BP 108/64 | HR 77 | Temp 98.5°F | Ht 71.0 in | Wt 143.5 lb

## 2015-07-15 DIAGNOSIS — E034 Atrophy of thyroid (acquired): Secondary | ICD-10-CM

## 2015-07-15 DIAGNOSIS — Z79899 Other long term (current) drug therapy: Secondary | ICD-10-CM

## 2015-07-15 DIAGNOSIS — F17210 Nicotine dependence, cigarettes, uncomplicated: Secondary | ICD-10-CM

## 2015-07-15 DIAGNOSIS — F17200 Nicotine dependence, unspecified, uncomplicated: Secondary | ICD-10-CM

## 2015-07-15 DIAGNOSIS — R1310 Dysphagia, unspecified: Secondary | ICD-10-CM

## 2015-07-15 DIAGNOSIS — E039 Hypothyroidism, unspecified: Secondary | ICD-10-CM

## 2015-07-15 DIAGNOSIS — R1314 Dysphagia, pharyngoesophageal phase: Secondary | ICD-10-CM

## 2015-07-15 DIAGNOSIS — R1319 Other dysphagia: Secondary | ICD-10-CM

## 2015-07-15 DIAGNOSIS — C320 Malignant neoplasm of glottis: Secondary | ICD-10-CM

## 2015-07-15 MED ORDER — OXYCODONE HCL 10 MG PO TABS: 10.0000 mg | ORAL_TABLET | Freq: Four times a day (QID) | ORAL | Status: DC | PRN

## 2015-07-15 MED ORDER — OXYCODONE HCL 10 MG PO TABS
10.0000 mg | ORAL_TABLET | Freq: Four times a day (QID) | ORAL | Status: DC | PRN
Start: 2015-07-15 — End: 2015-09-09

## 2015-07-15 NOTE — Assessment & Plan Note (Signed)
HPI: He did go to see Dr Wynetta Emery unfortunately his orange card was out of date and he was not able to be seen.  It appears from the note I saw from Dr Wynetta Emery that there is still concern for opoid's as a cause of his dysphagia.    A: Esophageal dysphagia.  P: Asked [name] to fill out orange card paperwork and follow up with Dr Wynetta Emery. Will try to taper Opoids.  Discussed this at length with [name] and he is in agreement.  He was able to make the 90 pills he was last prescribed last about 33 days.  I will reduce him with a slow taper to 85 pills for this next month and 80 pills to follow that.  He will try to have this last as long as possible and we will plan a follow up visit in 2 months.  He is welcome to come earlier or call if he has issues.

## 2015-07-15 NOTE — Assessment & Plan Note (Signed)
HPI: Has had variable TSH in the past.  This was due to non compliance due to cost of his medications.  His meds are now sent [facility] and he is able to afford the synthroid.  He has been taking the synthroid daily for the last 2 months.  He feels his energy levels are OK.  His weight is stable.  His sleep is normal.  He has no palpitations.  A: Hypothyroidism  P: Continue synthroid 126mcg. Check TSH at follow up in 2 months.

## 2015-07-15 NOTE — Patient Instructions (Signed)
General Instructions: Please get the paperwork together to get the orange card and follow up with Dr Wynetta Emery.  We are going to try to slowly cut back on the oxycodone.  Please bring your medicines with you each time you come to clinic.  Medicines may include prescription medications, over-the-counter medications, herbal remedies, eye drops, vitamins, or other pills.   Progress Toward Treatment Goals:  No flowsheet data found.  Self Care Goals & Plans:  Self Care Goal 07/15/2015  Manage my medications take my medicines as prescribed; bring my medications to every visit; refill my medications on time  Eat healthy foods eat more vegetables; eat foods that are low in salt; eat baked foods instead of fried foods  Stop smoking (No Data)    No flowsheet data found.   Care Management & Community Referrals:  No flowsheet data found.

## 2015-07-15 NOTE — Assessment & Plan Note (Signed)
HPI: Down to 3 cigarettes. Thinks he can quit completely  A: Tobacco use disorder  P: Given encouragement to set quit date.

## 2015-07-15 NOTE — Progress Notes (Signed)
[facility] Subjective:   Patient ID: [name] male   DOB: 12-07-1965 49 y.o.   MRN: [id number]  HPI: [name] is a 49 y.o. 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  See problem based a/p below for the status of his chronic medical problems.    Past Medical History  Diagnosis Date  . Hypothyroidism   . Heart murmur     child  . Headache(784.0)   . 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 Hx of radiation therapy 05/19/13- 06/27/13    glottic larynx, right true vocal cord 6300 cGy 28 sessions  . Shortness of breath    Current Outpatient Prescriptions  Medication Sig Dispense Refill  . albuterol (PROVENTIL HFA;VENTOLIN HFA) 108 (90 BASE) MCG/ACT inhaler Inhale 1-2 puffs into the lungs every 6 (six) hours as needed for wheezing or shortness of breath.    . levothyroxine (SYNTHROID, LEVOTHROID) 175 MCG tablet Take 1 tablet (175 mcg total) by mouth daily before breakfast. 30 tablet 5  . nicotine (NICODERM CQ - DOSED IN MG/24 HOURS) 14 mg/24hr patch Place 1 patch (14 mg total) onto the skin daily. (Patient not taking: Reported on 01/29/2015) 14 patch 0  . nicotine (NICODERM CQ - DOSED IN MG/24 HR) 7 mg/24hr patch Place 1 patch (7 mg total) onto the skin daily. (Patient not taking: Reported on 01/29/2015) 14 patch 1  . omeprazole (PRILOSEC) 40 MG capsule Twice a day for 1-2 weeks then once daily (Patient taking differently: Take 40 mg by mouth daily. ) 60 capsule 5  . Oxycodone HCl 10 MG TABS Take 1 tablet (10 mg total) by mouth every 6 (six) hours as needed. 90 tablet 0  . sertraline (ZOLOFT) 50 MG tablet Take 50 mg by mouth daily.     No current facility-administered medications for this visit.   Family History  Problem Relation Age of Onset  . Heart attack Father   . COPD Mother   . Emphysema Mother    Social History   Social History  . Marital Status:  Legally Separated    Spouse Name: N/A  . Number of Children: 3  . Years of Education: N/A   Occupational History  .  [employer]   Social History Main Topics  . Smoking status: Current Every Day Smoker -- 0.20 packs/day for 34 years    Types: Cigarettes  . Smokeless tobacco: Never Used     Comment: Smokes a little less. DOWN TO 1 PACK A WEEK  . Alcohol Use: No  . Drug Use: No  . Sexual Activity: Not on file   Other Topics Concern  . Not on file   Social History Narrative   The patient is married. Patient has 3 children. 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   The patient has a history of smoking 2 packs of cigarette a day for 34 years. Patient current is trying to quit smoking. Patient denies use of alcohol. Patient denies use of other illicit drugs.   Review of Systems: Review of Systems  Constitutional: Negative for weight loss and malaise/fatigue.  HENT: Positive for sore throat.   Respiratory: Negative for cough and shortness of breath.   Cardiovascular: Negative for chest pain and palpitations.  Gastrointestinal: Negative for abdominal pain.  Musculoskeletal: Negative for myalgias.  Neurological: Negative for weakness.  Psychiatric/Behavioral: Negative for depression.  All other systems reviewed and are negative.    Objective:  Physical Exam: Filed Vitals:   07/15/15 1322  BP: 108/64  Pulse: 77  Temp: 98.5 F (36.9 C)  TempSrc: Oral  Height: 5\' 11"  (1.803 m)  Weight: 143 lb 8 oz (65.091 kg)  SpO2: 100%   Physical Exam  Constitutional: He is well-developed, well-nourished, and in no distress.  HENT:  Mouth/Throat: Oropharynx is clear and moist. No oropharyngeal exudate.  Neck: Neck supple. No tracheal deviation present.  Cardiovascular: Normal rate and regular rhythm.   Pulmonary/Chest: Effort normal and breath sounds normal. He has no wheezes.  Nursing note and vitals reviewed.   Assessment & Plan:  Case discussed with Dr.  [name]  Hypothyroidism HPI: Has had variable TSH in the past.  This was due to non compliance due to cost of his medications.  [facility] meds [facility] and he is able to afford the synthroid.  He has been taking the synthroid daily for the last 2 months.  He feels his energy levels are OK.  His weight is stable.  His sleep is normal.  He has no palpitations.  A: Hypothyroidism  P: Continue synthroid 19mcg. Check TSH at follow up in 2 months.  Tobacco use disorder HPI: Down to 3 cigarettes. Thinks he can quit completely  A: Tobacco use disorder  P: Given encouragement to set quit date.  Esophageal dysphagia HPI: He did go to see Dr Wynetta Emery unfortunately his orange card was out of date and he was not able to be seen.  It appears from the note I saw from Dr Wynetta Emery that there is still concern for opoid's as a cause of his dysphagia.    A: Esophageal dysphagia.  P: Asked Mr Preval to fill out orange card paperwork and follow up with Dr Wynetta Emery. Will try to taper Opoids.  Discussed this at length with Mr. Kaneko and he is in agreement.  He was able to make the 90 pills he was last prescribed last about 33 days.  I will reduce him with a slow taper to 85 pills for this next month and 80 pills to follow that.  He will try to have this last as long as possible and we will plan a follow up visit in 2 months.  He is welcome to come earlier or call if he has issues.      Medications Ordered Meds ordered this encounter  Medications  . DISCONTD: Oxycodone HCl 10 MG TABS    Sig: Take 1 tablet (10 mg total) by mouth every 6 (six) hours as needed.    Dispense:  85 tablet    Refill:  0    Rx 1/2  . Oxycodone HCl 10 MG TABS    Sig: Take 1 tablet (10 mg total) by mouth every 6 (six) hours as needed.    Dispense:  80 tablet    Refill:  0    Rx 2/2  May be filled 30 days from print date   Other Orders No orders of the defined types were placed in this encounter.   Follow  Up: Return in about 2 months (around 09/15/2015).

## 2015-07-19 NOTE — Progress Notes (Signed)
Case discussed with Dr. Hoffman soon after the resident saw the patient.  We reviewed the resident's history and exam and pertinent patient test results.  I agree with the assessment, diagnosis, and plan of care documented in the resident's note. 

## 2015-08-13 MED FILL — oxyCODONE HCL 10 MG TABS: 10 | 20 days supply | Qty: 80 | Fill #0

## 2015-09-09 ENCOUNTER — Other Ambulatory Visit: Payer: Self-pay | Admitting: Internal Medicine

## 2015-09-09 DIAGNOSIS — C320 Malignant neoplasm of glottis: Secondary | ICD-10-CM

## 2015-09-09 NOTE — Telephone Encounter (Signed)
Patient is requesting pain medication refill. Says he will run out over the weekend.

## 2015-09-09 NOTE — Telephone Encounter (Signed)
Last appt 12/22 Next appt 02/23 Last refill 12/22 Last uds 02/2015

## 2015-09-10 ENCOUNTER — Other Ambulatory Visit: Payer: Self-pay | Admitting: Internal Medicine

## 2015-09-10 MED ORDER — OXYCODONE HCL 10 MG PO TABS: 10.0000 mg | ORAL_TABLET | Freq: Four times a day (QID) | ORAL | Status: DC | PRN

## 2015-09-10 NOTE — Telephone Encounter (Signed)
Pt requesting oxycodone to be filled by today. Please call pt back.

## 2015-09-10 NOTE — Telephone Encounter (Signed)
Will plan to continue the slow taper down to 75 pills, would like him to be seen for follow up before next fill.

## 2015-09-10 NOTE — Telephone Encounter (Signed)
rx is ready, tried to call pt but nuber goes straight to recording

## 2015-09-10 NOTE — Telephone Encounter (Signed)
Called, [phone], got a recording saying could not lm, person unavailable

## 2015-09-13 MED FILL — oxyCODONE HCL 10 MG TABS: 10 | 18 days supply | Qty: 75 | Fill #0

## 2015-09-15 ENCOUNTER — Telehealth: Payer: Self-pay | Admitting: Internal Medicine

## 2015-09-15 NOTE — Telephone Encounter (Signed)
APPT. REMINDER CALL, NO ANSWER, NO VOICE MAIL °

## 2015-09-16 ENCOUNTER — Encounter: Payer: Self-pay | Admitting: Internal Medicine

## 2015-09-28 ENCOUNTER — Telehealth: Payer: Self-pay | Admitting: Internal Medicine

## 2015-09-28 NOTE — Telephone Encounter (Signed)
APPT. REMINDER CALL, LMTCB °

## 2015-09-29 ENCOUNTER — Ambulatory Visit: Payer: Self-pay

## 2015-09-29 ENCOUNTER — Encounter: Payer: Self-pay | Admitting: Internal Medicine

## 2015-09-30 ENCOUNTER — Encounter: Payer: Self-pay | Admitting: Internal Medicine

## 2015-09-30 ENCOUNTER — Other Ambulatory Visit: Payer: Self-pay | Admitting: Pharmacist

## 2015-10-06 ENCOUNTER — Telehealth: Payer: Self-pay | Admitting: Internal Medicine

## 2015-10-06 NOTE — Telephone Encounter (Signed)
APPT. REMINDER CALL, LMTCB °

## 2015-10-07 ENCOUNTER — Ambulatory Visit: Payer: Self-pay

## 2015-10-11 ENCOUNTER — Telehealth: Payer: Self-pay | Admitting: Internal Medicine

## 2015-10-11 DIAGNOSIS — C320 Malignant neoplasm of glottis: Secondary | ICD-10-CM

## 2015-10-11 IMAGING — RF DG ESOPHAGUS
14 of 24 series · 14 of 24 positions shown · non-contrast
Comparison: 05/08/2013 PET

CLINICAL DATA: Feeling of fullness within the throat since
radiation therapy beginning in [REDACTED]. Head neck primary
malignancy.

EXAM:
ESOPHOGRAM / BARIUM SWALLOW / BARIUM TABLET STUDY
TECHNIQUE: Combined double contrast and single contrast examination performed
using effervescent crystals, thick barium liquid, and thin barium
liquid. The patient was observed with fluoroscopy swallowing a 13mm
barium sulphate tablet.
FLUOROSCOPY TIME:  5 min and 1 second

[Series 1: run · 1 of 8 slices shown (1 of 14)]
[im 1/8]
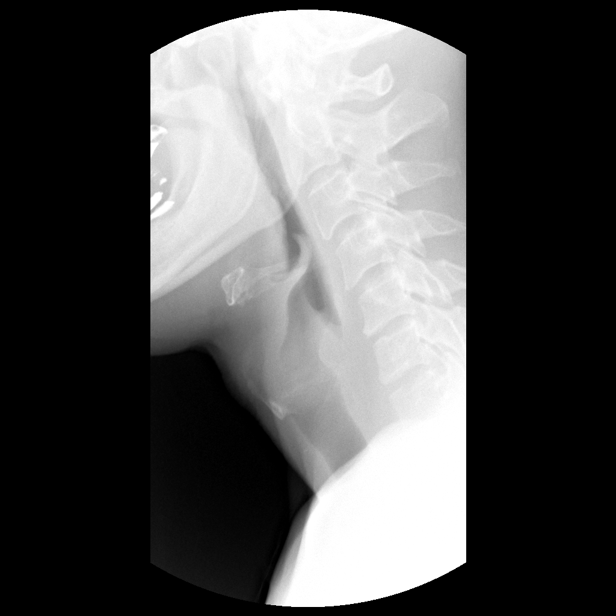

[Series 3: run · 1 of 1 slices shown (2 of 14)]
[im 1/1]
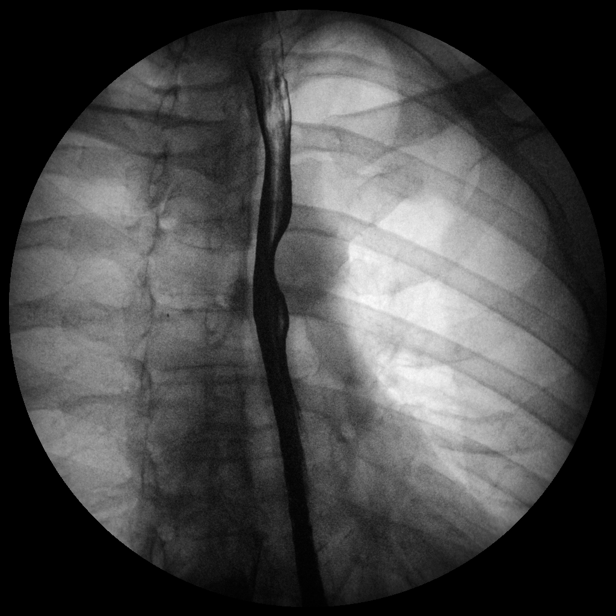

[Series 5: run · 1 of 1 slices shown (3 of 14)]
[im 1/1]
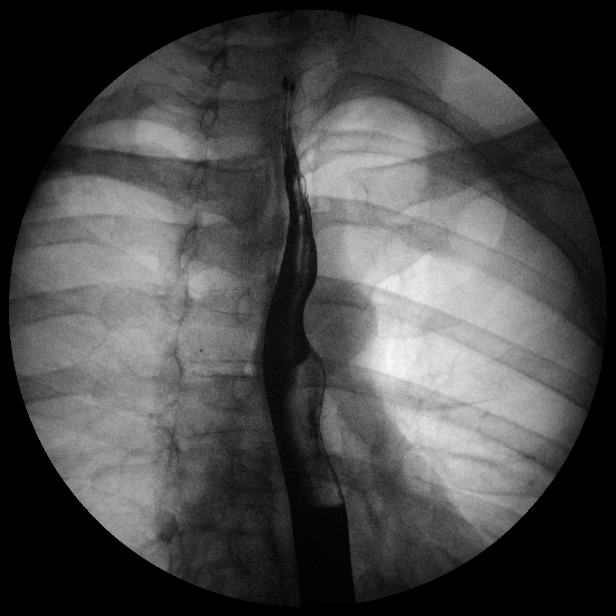

[Series 7: run · 1 of 1 slices shown (4 of 14)]
[im 1/1]
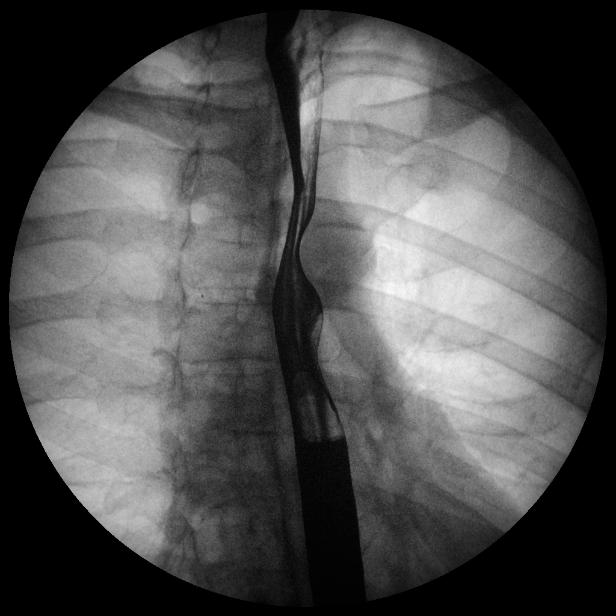

[Series 8: run · 1 of 1 slices shown (5 of 14)]
[im 1/1]
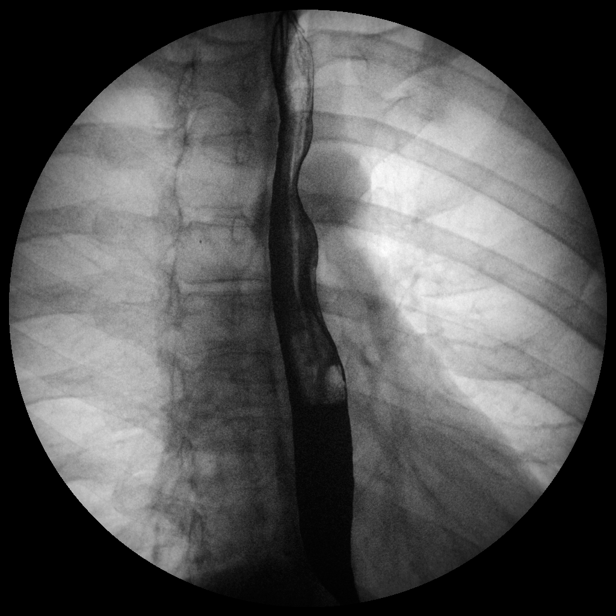

[Series 10: run · 1 of 1 slices shown (6 of 14)]
[im 1/1]
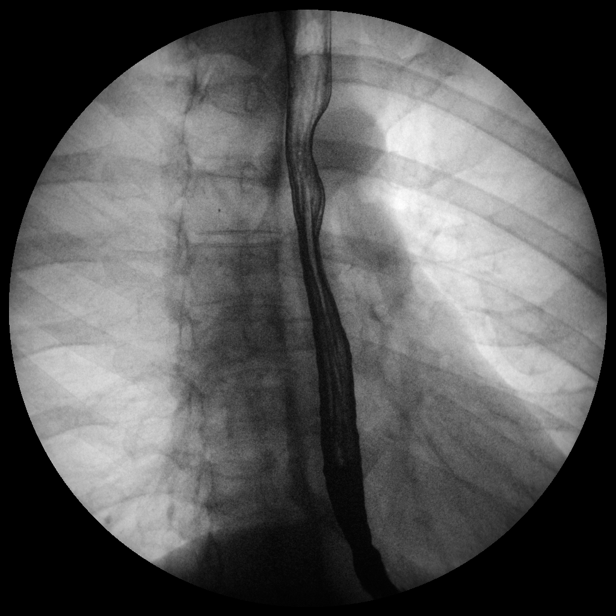

[Series 12: run · 1 of 1 slices shown (7 of 14)]
[im 1/1]
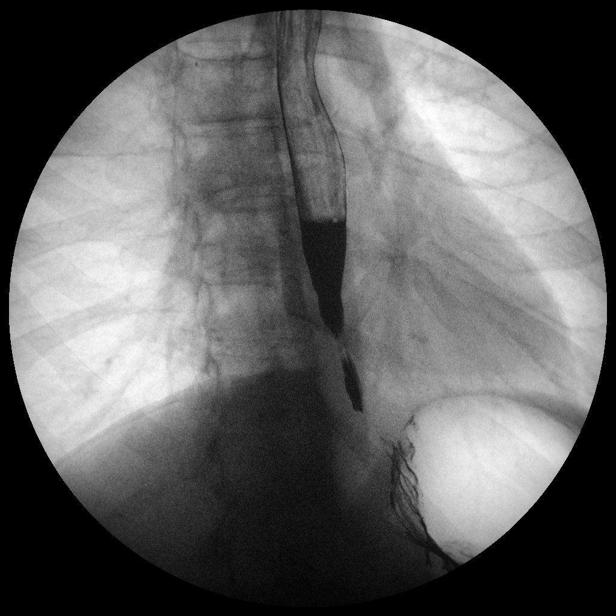

[Series 13: run · 1 of 1 slices shown (8 of 14)]
[im 1/1]
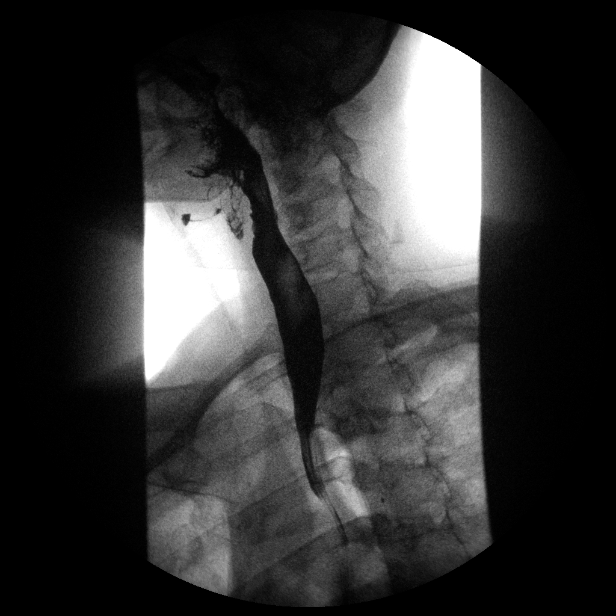

[Series 15: run · 1 of 1 slices shown (9 of 14)]
[im 1/1]
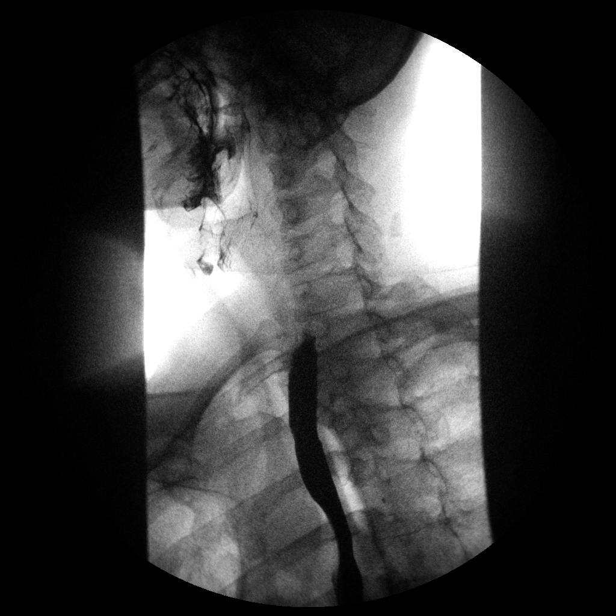

[Series 17: run · 1 of 1 slices shown (10 of 14)]
[im 1/1]
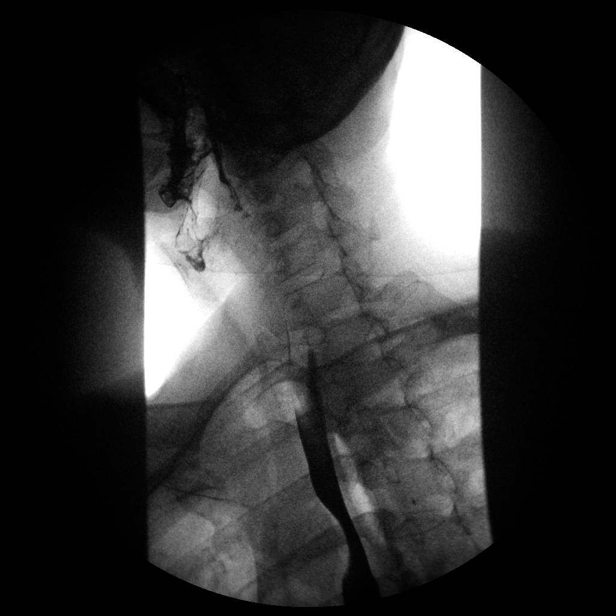

[Series 19: run · 1 of 1 slices shown (11 of 14)]
[im 1/1]
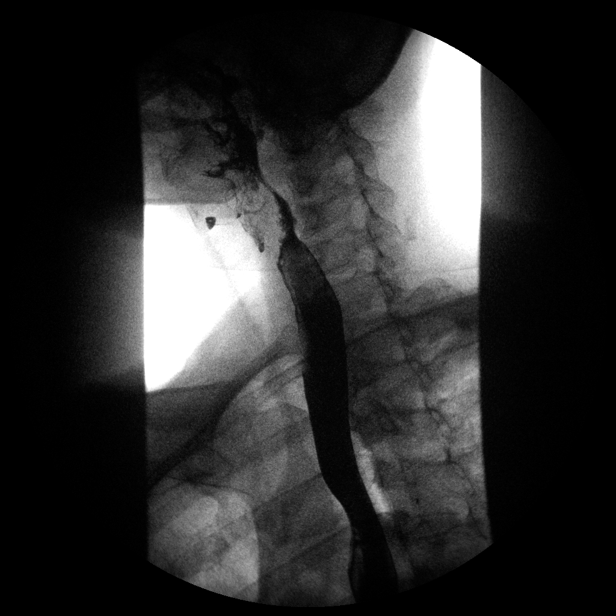

[Series 20: run · 1 of 1 slices shown (12 of 14)]
[im 1/1]
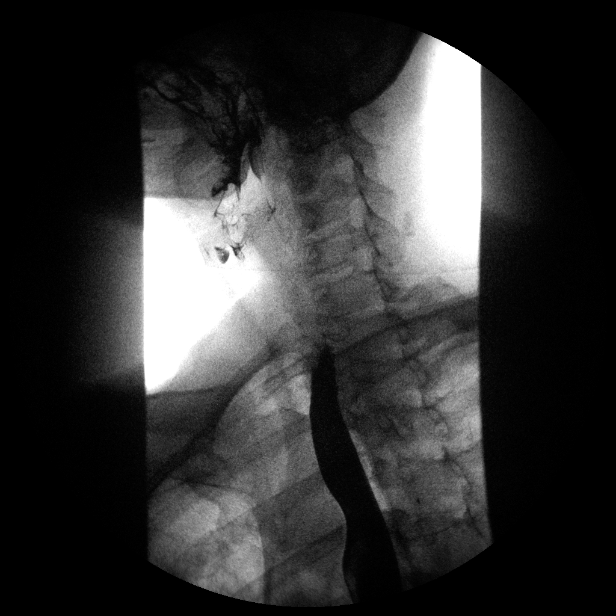

[Series 22: run · 1 of 1 slices shown (13 of 14)]
[im 1/1]
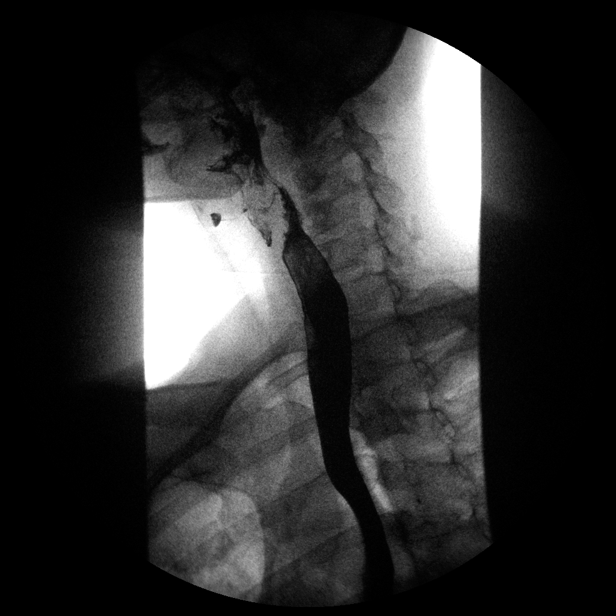

[Series 24: run · 1 of 1 slices shown (14 of 14)]
[im 1/1]
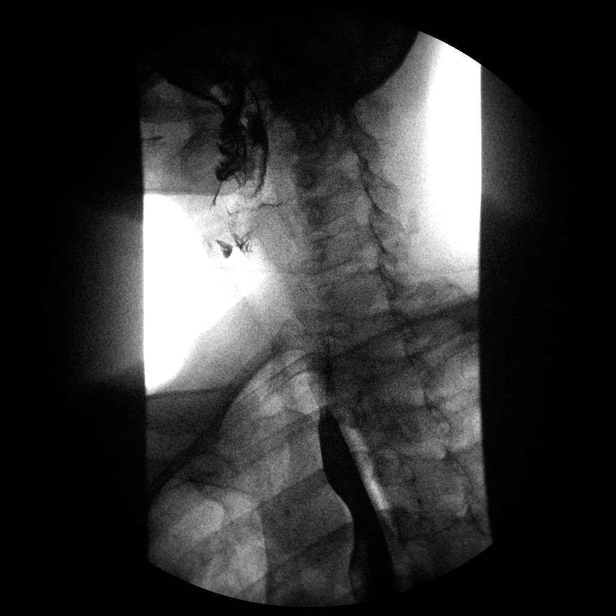

[14 of 24 positions shown; findings below may reference images not displayed]

FINDINGS: Hypo pharyngeal portion of the exam demonstrates moved mild
narrowing of the contrast column at the level of the proximal
esophagus. Example image 6 of series 1.

Double contrast evaluation of the esophagus demonstrates no mucosal
abnormality.

Evaluation of primary peristalsis demonstrates a normal primary
peristaltic wave on each swallow.

Full column evaluation of the esophagus demonstrates an area of
incomplete distension at the gastroesophageal junction/ esophageal
vestibule. This distends maximally on series 67. No mucosal
abnormality in this area.

13 mm barium tablet has persistent delayed passage at the distal
esophagus. Series 77.
IMPRESSION: 1. Area of mild underdistention at the gastroesophageal
junction/esophageal vestibule. Concurrent marked delayed passage of
a 13 mm barium tablet at this level. Cannot exclude mild (likely
inflammatory/peptic) stricture in this area. Endoscopy should be
considered.
2. Mild narrowing of the contrast column at the C5-6 level. This may
represent an area of radiation induced stricture of the proximal
esophagus. This would also be best evaluated with endoscopy.
These results will be called to the ordering clinician or
representative by the Radiologist Assistant, and communication
documented in the PACS or zVision Dashboard.

## 2015-10-11 NOTE — Telephone Encounter (Signed)
Pt needs refill of pain medication.

## 2015-10-11 NOTE — Telephone Encounter (Signed)
Last ordered 2/17 Last appt 12/22, no future appts, has cancelled recent appts Last uds 02/2015

## 2015-10-12 NOTE — Telephone Encounter (Signed)
Patient needs appointment for refill, has No showed to last 2 appointments

## 2015-10-13 ENCOUNTER — Encounter: Payer: Self-pay | Admitting: Internal Medicine

## 2015-10-13 ENCOUNTER — Ambulatory Visit (INDEPENDENT_AMBULATORY_CARE_PROVIDER_SITE_OTHER): Payer: Self-pay | Admitting: Internal Medicine

## 2015-10-13 VITALS — BP 136/82 | HR 88 | Temp 98.3°F | Ht 71.0 in | Wt 153.1 lb

## 2015-10-13 DIAGNOSIS — C320 Malignant neoplasm of glottis: Secondary | ICD-10-CM

## 2015-10-13 DIAGNOSIS — E034 Atrophy of thyroid (acquired): Secondary | ICD-10-CM

## 2015-10-13 DIAGNOSIS — F17200 Nicotine dependence, unspecified, uncomplicated: Secondary | ICD-10-CM

## 2015-10-13 DIAGNOSIS — R42 Dizziness and giddiness: Secondary | ICD-10-CM

## 2015-10-13 DIAGNOSIS — F17210 Nicotine dependence, cigarettes, uncomplicated: Secondary | ICD-10-CM

## 2015-10-13 DIAGNOSIS — I959 Hypotension, unspecified: Secondary | ICD-10-CM

## 2015-10-13 DIAGNOSIS — E039 Hypothyroidism, unspecified: Secondary | ICD-10-CM

## 2015-10-13 DIAGNOSIS — E038 Other specified hypothyroidism: Secondary | ICD-10-CM

## 2015-10-13 DIAGNOSIS — Z923 Personal history of irradiation: Secondary | ICD-10-CM

## 2015-10-13 MED ORDER — NICOTINE 7 MG/24HR TD PT24: 7.0000 mg | MEDICATED_PATCH | Freq: Every day | TRANSDERMAL | Status: DC

## 2015-10-13 MED ORDER — NICOTINE POLACRILEX 2 MG MT GUM: 2.0000 mg | CHEWING_GUM | OROMUCOSAL | Status: DC | PRN

## 2015-10-13 MED ORDER — OXYCODONE HCL 10 MG PO TABS: 10.0000 mg | ORAL_TABLET | Freq: Four times a day (QID) | ORAL | Status: DC | PRN

## 2015-10-13 MED ORDER — OMEPRAZOLE 40 MG PO CPDR: 40.0000 mg | DELAYED_RELEASE_CAPSULE | Freq: Every day | ORAL | Status: DC

## 2015-10-13 MED FILL — oxyCODONE HCL 10 MG TABS: 10 | 17 days supply | Qty: 70 | Fill #0

## 2015-10-13 NOTE — Patient Instructions (Signed)
1. PLease make a follow up appointment for 1 month.   2. Please take all medications as previously prescribed with the following changes:  Continue Prilosec, Synthroid.   Please make a follow up appointment with Dr. Wynetta Emery and set up [facility] so that we can refer you to ENT for further workup of your worsening hoarseness.   3. If you have worsening of your symptoms or new symptoms arise, please call the clinic [phone]), or go to the ER immediately if symptoms are severe.

## 2015-10-13 NOTE — Progress Notes (Signed)
Subjective:   Patient ID: [name] male   DOB: [birth year] 50 y.o.   MRN: [id number]  HPI: [name] is a 50 y.o. male w/ PMHx of Hypothyroidism and tobacco abuse, presents to the clinic today for a follow-up visit regarding his thyroid function. Patient also states he has had some recent lightheadedness and numbness and tingling in his fingertips. He also states his hoarseness has gotten worse over the past few weeks. He otherwise feels he has been doing well. Has not taken his Oxycodone in three days and is having quite a bit of throat pain recently. No fever, chills, nausea, vomiting. He has gained some weight recently, about 10 lbs since his last clinic visit. Has not seen radiation oncology or GI doctor for his dysphagia in some time.   Past Medical History  Diagnosis Date  . Hypothyroidism   . Heart murmur     child  . Headache(784.0)   . 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 Hx of radiation therapy 05/19/13- 06/27/13    glottic larynx, right true vocal cord 6300 cGy 28 sessions  . Shortness of breath    Current Outpatient Prescriptions  Medication Sig Dispense Refill  . albuterol (PROVENTIL HFA;VENTOLIN HFA) 108 (90 BASE) MCG/ACT inhaler Inhale 1-2 puffs into the lungs every 6 (six) hours as needed for wheezing or shortness of breath.    . levothyroxine (SYNTHROID, LEVOTHROID) 175 MCG tablet Take 1 tablet (175 mcg total) by mouth daily before breakfast. 30 tablet 5  . nicotine (NICODERM CQ - DOSED IN MG/24 HOURS) 14 mg/24hr patch Place 1 patch (14 mg total) onto the skin daily. (Patient not taking: Reported on 01/29/2015) 14 patch 0  . nicotine (NICODERM CQ - DOSED IN MG/24 HR) 7 mg/24hr patch Place 1 patch (7 mg total) onto the skin daily. (Patient not taking: Reported on 01/29/2015) 14 patch 1  . omeprazole (PRILOSEC) 40 MG capsule Twice a day for 1-2 weeks then once daily (Patient taking differently: Take 40 mg by mouth daily. ) 60 capsule 5  . Oxycodone HCl 10 MG TABS  Take 1 tablet (10 mg total) by mouth every 6 (six) hours as needed. 75 tablet 0  . sertraline (ZOLOFT) 50 MG tablet Take 50 mg by mouth daily.     No current facility-administered medications for this visit.    Review of Systems: General: Positive for hoarseness. Denies fever, chills, diaphoresis, appetite change and fatigue.  Respiratory: Denies SOB, DOE, cough, and wheezing.   Cardiovascular: Denies chest pain and palpitations.  Gastrointestinal: Denies nausea, vomiting, abdominal pain, and diarrhea.  Genitourinary: Denies dysuria, increased frequency, and flank pain. Endocrine: Denies hot or cold intolerance, polyuria, and polydipsia. Musculoskeletal: Denies myalgias, back pain, joint swelling, arthralgias and gait problem.  Skin: Denies pallor, rash and wounds.  Neurological: Positive for mild paresthesias and lightheadedness. Denies dizziness, seizures, syncope, weakness, and headaches.  Psychiatric/Behavioral: Denies mood changes, and sleep disturbances.  Objective:   Physical Exam: Filed Vitals:   10/13/15 1430  BP: 136/82  Pulse: 88  Temp: 98.3 F (36.8 C)  TempSrc: Oral  Height: 5\' 11"  (1.803 m)  Weight: 153 lb 1.6 oz (69.446 kg)  SpO2: 100%    General: Thin appearing male, alert, cooperative, NAD. HEENT: PERRL, EOMI. Moist mucus membranes. Oropharynx without any obvious abnormalities. Neck: Full range of motion without pain, supple, no lymphadenopathy or carotid bruits. No significant tenderness over the neck. Hoarseness of voice.  Lungs: Clear to ascultation bilaterally, normal work  of respiration, no wheezes, rales, rhonchi Heart: RRR, no murmurs, gallops, or rubs Abdomen: Soft, non-tender, non-distended, BS + Extremities: No cyanosis, clubbing, or edema Neurologic: Alert & oriented X3, cranial nerves II-XII intact, strength grossly intact, sensation intact to light touch. NO FOCAL DEFICITS.    Assessment & Plan:   Please see problem based assessment and  plan.

## 2015-10-14 LAB — BMP8+ANION GAP
Anion Gap: 17 mmol/L (ref 10.0–18.0)
BUN/Creatinine Ratio: 16 (ref 9–20)
BUN: 17 mg/dL (ref 6–24)
CHLORIDE: 101 mmol/L (ref 96–106)
CO2: 23 mmol/L (ref 18–29)
Calcium: 9.6 mg/dL (ref 8.7–10.2)
Creatinine, Ser: 1.04 mg/dL (ref 0.76–1.27)
GFR calc Af Amer: 96 mL/min/{1.73_m2} (ref >59)
GFR, EST NON AFRICAN AMERICAN: 83 mL/min/{1.73_m2} (ref >59)
GLUCOSE: 97 mg/dL (ref 65–99)
POTASSIUM: 4 mmol/L (ref 3.5–5.2)
Sodium: 141 mmol/L (ref 134–144)

## 2015-10-14 LAB — TSH: TSH: 18.74 u[IU]/mL — ABNORMAL HIGH (ref 0.450–4.500)

## 2015-10-14 NOTE — Assessment & Plan Note (Addendum)
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 Has not seen rad/onc in some time. Now with new hoarseness and neck pain. No obvious findings on exam, no significant lymphadenopathy, no tenderness on exam. Suspect this is related to post-radiation changes, although some concern given new worsening of hoarseness.  -Patient advised to follow up with Dr. Isidore Moos; patient states she has done laryngoscopy many times in the past. Feel this is important. If patient unable to schedule his won appointment, will arrange a referral. [place] card paper work pending.  -Refilled Oxycodone 10 mg q6h prn #70. Previously received #75, although PCP is attempting to taper medication.

## 2015-10-14 NOTE — Assessment & Plan Note (Signed)
Patient states he has been compliant with his Synthroid, takes 175 mcg daily. Previous TSH 117.7 in 04/2015, now 18.7. Weight has increased since his last visit, although actually improved BMI.  -Continue current dose, recheck in 3 months.

## 2015-10-14 NOTE — Progress Notes (Signed)
Internal Medicine Clinic Attending  Case discussed with Dr. Jones at the time of the visit.  We reviewed the resident's history and exam and pertinent patient test results.  I agree with the assessment, diagnosis, and plan of care documented in the resident's note.  

## 2015-10-14 NOTE — Assessment & Plan Note (Signed)
Still smoking 3-7 cigs daily.  -Refilled Nicoderm 7 mg patch -Also given Rx for Nicorette to use in addition to patch.

## 2015-10-14 NOTE — Assessment & Plan Note (Addendum)
Patient mildly orthostatic on exam, says he has not been drinking much fluids lately. Does state his lightheadedness is mostly with standing. Does say he is able to maintain adequate po intake, not associated with previous issues with dysphagia, says this has not been as significant of a problem lately. Suspect lightheadedness is related to orthostasis and mild dehydration. Cr near baseline.  -Advised increased fluid intake -If this continues to be an issue, can undergo further workup

## 2015-11-09 ENCOUNTER — Other Ambulatory Visit: Payer: Self-pay

## 2015-11-09 DIAGNOSIS — C320 Malignant neoplasm of glottis: Secondary | ICD-10-CM

## 2015-11-09 NOTE — Telephone Encounter (Signed)
Pt requesting oxycodone to be filled. °

## 2015-11-09 NOTE — Telephone Encounter (Signed)
Last written 3/22 Next appt 6/1

## 2015-11-10 MED ORDER — OXYCODONE HCL 10 MG PO TABS: 10.0000 mg | ORAL_TABLET | Freq: Three times a day (TID) | ORAL | Status: DC | PRN

## 2015-11-10 NOTE — Telephone Encounter (Signed)
Will continue taper, 65 pills this month 60 pills next month.  Needs to be seen before additional scripts.  Must be seen if increased pain.

## 2015-11-10 NOTE — Telephone Encounter (Signed)
Pt informed, he must schedule an appt w/ dr Heber [place] and keep the appt

## 2015-11-11 MED FILL — oxyCODONE HCL 10 MG TABS: 10 | 21 days supply | Qty: 65 | Fill #0

## 2015-11-12 ENCOUNTER — Ambulatory Visit: Payer: Self-pay | Admitting: Internal Medicine

## 2015-11-18 ENCOUNTER — Telehealth: Payer: Self-pay | Admitting: Internal Medicine

## 2015-11-18 NOTE — Telephone Encounter (Signed)
APPT. REMINDER CALL, LMTCB °

## 2015-11-19 ENCOUNTER — Ambulatory Visit: Payer: Self-pay

## 2015-12-07 ENCOUNTER — Telehealth: Payer: Self-pay | Admitting: Internal Medicine

## 2015-12-07 ENCOUNTER — Telehealth: Payer: Self-pay

## 2015-12-07 NOTE — Telephone Encounter (Signed)
LVM to notify patient that we have a prescription here for the pain medicine and see why he needs the prilosec refilled. My our records he should not need any at this time.

## 2015-12-07 NOTE — Telephone Encounter (Signed)
Requesting the nurse to call back.

## 2015-12-07 NOTE — Telephone Encounter (Signed)
Needs refill of omeprazole (PRILOSEC) 40 MG capsule [facility] on [address]. Also wants pain medication

## 2015-12-10 MED FILL — oxyCODONE HCL 10 MG TABS: 10 | 20 days supply | Qty: 60 | Fill #0

## 2015-12-15 ENCOUNTER — Ambulatory Visit
Admission: RE | Admit: 2015-12-15 | Discharge: 2015-12-15 | Disposition: A | Discharge status: Home / Self Care | Payer: Self-pay | Source: Ambulatory Visit | Attending: Radiation Oncology | Admitting: Radiation Oncology

## 2015-12-15 ENCOUNTER — Telehealth: Payer: Self-pay

## 2015-12-15 NOTE — Telephone Encounter (Signed)
[name] called me back to let me know that he was unable to get a ride here today, and would need to reschedule. I offered to transfer him to our schedulers to reschedule, but he declined and stated he would call to set up a new appointment.

## 2015-12-15 NOTE — Telephone Encounter (Signed)
I called Mr. [name] to inquire about his status regarding his appointment today with Dr. Isidore Moos, it was at 11:00. I was only able to leave a voice mail message with a request to call me back.

## 2015-12-23 ENCOUNTER — Ambulatory Visit: Payer: Self-pay | Admitting: Internal Medicine

## 2015-12-24 ENCOUNTER — Encounter: Payer: Self-pay | Admitting: Internal Medicine

## 2015-12-29 ENCOUNTER — Ambulatory Visit: Payer: Self-pay

## 2016-01-11 ENCOUNTER — Other Ambulatory Visit: Payer: Self-pay

## 2016-01-11 DIAGNOSIS — C320 Malignant neoplasm of glottis: Secondary | ICD-10-CM

## 2016-01-11 NOTE — Telephone Encounter (Signed)
Last filled 5/19 appt 6/26 0815 dr Heber [place]

## 2016-01-11 NOTE — Telephone Encounter (Signed)
Pt requesting oxycodone to be filled. °

## 2016-01-13 MED ORDER — OXYCODONE HCL 10 MG PO TABS: 10.0000 mg | ORAL_TABLET | Freq: Three times a day (TID) | ORAL | Status: DC | PRN

## 2016-01-13 NOTE — Telephone Encounter (Signed)
Called, no answer, no option to leave vmail

## 2016-01-17 ENCOUNTER — Telehealth: Payer: Self-pay

## 2016-01-17 ENCOUNTER — Ambulatory Visit: Payer: Self-pay | Admitting: Internal Medicine

## 2016-01-17 NOTE — Telephone Encounter (Signed)
Tried to call °

## 2016-01-17 NOTE — Telephone Encounter (Signed)
Pt want to know if pain med is ready. Please call pt back.  

## 2016-01-18 MED FILL — oxyCODONE HCL 10 MG TABS: 10 | 3 days supply | Qty: 10 | Fill #0

## 2016-01-18 NOTE — Telephone Encounter (Signed)
Pt will per dr Heber [place] be seen 6/28 at 1115, it is stressed to pt that he must be seen or there will be no more pain med scripts, appt made and pt states he will try his best

## 2016-01-19 ENCOUNTER — Encounter: Payer: Self-pay | Admitting: Internal Medicine

## 2016-01-19 ENCOUNTER — Ambulatory Visit: Payer: Self-pay | Admitting: Internal Medicine

## 2016-02-10 ENCOUNTER — Encounter: Payer: Self-pay | Admitting: Internal Medicine

## 2016-02-14 ENCOUNTER — Ambulatory Visit: Payer: Self-pay

## 2016-02-16 ENCOUNTER — Telehealth: Payer: Self-pay | Admitting: Internal Medicine

## 2016-02-16 NOTE — Telephone Encounter (Signed)
APPT. REMINDER CALL, LMTCB °

## 2016-02-17 ENCOUNTER — Ambulatory Visit (INDEPENDENT_AMBULATORY_CARE_PROVIDER_SITE_OTHER): Payer: Self-pay | Admitting: Pharmacist

## 2016-02-17 ENCOUNTER — Encounter: Payer: Self-pay | Admitting: Internal Medicine

## 2016-02-17 ENCOUNTER — Ambulatory Visit: Payer: Self-pay

## 2016-02-17 ENCOUNTER — Ambulatory Visit (INDEPENDENT_AMBULATORY_CARE_PROVIDER_SITE_OTHER): Payer: Self-pay | Admitting: Internal Medicine

## 2016-02-17 VITALS — BP 135/84 | HR 63 | Temp 98.2°F | Ht 71.0 in

## 2016-02-17 DIAGNOSIS — Z9119 Patient's noncompliance with other medical treatment and regimen: Secondary | ICD-10-CM

## 2016-02-17 DIAGNOSIS — Z7189 Other specified counseling: Secondary | ICD-10-CM

## 2016-02-17 DIAGNOSIS — Z719 Counseling, unspecified: Secondary | ICD-10-CM

## 2016-02-17 DIAGNOSIS — F17200 Nicotine dependence, unspecified, uncomplicated: Secondary | ICD-10-CM

## 2016-02-17 DIAGNOSIS — E038 Other specified hypothyroidism: Secondary | ICD-10-CM

## 2016-02-17 DIAGNOSIS — C320 Malignant neoplasm of glottis: Secondary | ICD-10-CM

## 2016-02-17 DIAGNOSIS — F17210 Nicotine dependence, cigarettes, uncomplicated: Secondary | ICD-10-CM

## 2016-02-17 DIAGNOSIS — Z9114 Patient's other noncompliance with medication regimen: Secondary | ICD-10-CM

## 2016-02-17 DIAGNOSIS — J439 Emphysema, unspecified: Secondary | ICD-10-CM

## 2016-02-17 DIAGNOSIS — E034 Atrophy of thyroid (acquired): Secondary | ICD-10-CM

## 2016-02-17 DIAGNOSIS — Z923 Personal history of irradiation: Secondary | ICD-10-CM

## 2016-02-17 DIAGNOSIS — Z79899 Other long term (current) drug therapy: Secondary | ICD-10-CM

## 2016-02-17 MED ORDER — LEVOTHYROXINE SODIUM 175 MCG PO TABS: 175.0000 ug | ORAL_TABLET | Freq: Every day | ORAL | 3 refills | Status: DC

## 2016-02-17 MED ORDER — ALBUTEROL SULFATE HFA 108 (90 BASE) MCG/ACT IN AERS: 1.0000 | INHALATION_SPRAY | Freq: Four times a day (QID) | RESPIRATORY_TRACT | 3 refills | Status: DC | PRN

## 2016-02-17 MED ORDER — OXYCODONE HCL 10 MG PO TABS: 10.0000 mg | ORAL_TABLET | Freq: Three times a day (TID) | ORAL | 0 refills | Status: DC | PRN

## 2016-02-17 MED ORDER — OMEPRAZOLE 20 MG PO CPDR: 20.0000 mg | DELAYED_RELEASE_CAPSULE | Freq: Every day | ORAL | 3 refills | Status: DC

## 2016-02-17 MED ORDER — LEVOTHYROXINE SODIUM 175 MCG PO TABS: 175.0000 ug | ORAL_TABLET | Freq: Every day | ORAL | 0 refills | Status: DC

## 2016-02-17 MED FILL — LEVOTHYROXINE 175 MCG TAB: 175 | 30 days supply | Qty: 30 | Fill #0

## 2016-02-17 MED FILL — oxyCODONE HCL 10 MG TABS: 10 | 16 days supply | Qty: 50 | Fill #0

## 2016-02-17 NOTE — Assessment & Plan Note (Signed)
I suspect his fatigue and generalized malaise is due to hypothyroidism. We have never been able to get him a steady state on his Synthroid medication due to variable compliance. Today again asked Dr. Maudie Mercury our clinical pharmacist to assist. I have instructed [name] of the importance of taking the Synthroid medication. We have previously transferred this prescription to [facility] so that it will be $4. Today we will try to attempt to get a 30 day supply from the current pharmacy as well as follow-up paperwork through the med assist program.  Assessment Uncontrolled hypothyroidism  Plan Continue levothyroxine 175 g daily Deferred TSH as it will not change management today

## 2016-02-17 NOTE — Assessment & Plan Note (Signed)
I stressed the importance of follow-up with specialist for monitoring of side effects of his post radiation therapy.  As well as evaluation for cancer recurrence. He understands and reports that he will reschedule his appointment with Dr. Isidore Moos.

## 2016-02-17 NOTE — Progress Notes (Signed)
[facility] Subjective:  HPI: [name] is a 50 y.o. male with a PMH detailed below who presents for Overdue follow-up for hypothyroidism and chronic pain. He is no showed multiple appointments over the last few months and I stopped refilling his oxycodone. He reports to me that he lost his job at [employer]. He is not been able to afford his medications. He also notes that he feels terrible to elaborate on this he notes that he is exhausted and has little energy and continues to have his throat pain. Despite the decreased oxycodone. Reports that he's still has difficulty swallowing. He missed his appointment with Dr. Basilio Cairo in May due to lack of transportation he notes that he had to have a friend dropped him off here today.    Review of Systems: Review of Systems  Constitutional: Positive for malaise/fatigue. Negative for chills and fever.  Eyes: Negative for blurred vision.  Respiratory: Negative for shortness of breath.   Cardiovascular: Negative for chest pain.  Neurological: Positive for weakness.  Psychiatric/Behavioral: Negative for substance abuse.    Objective:  Physical Exam: Vitals:   02/17/16 1058  BP: 135/84  Pulse: 63  Temp: 98.2 F (36.8 C)  TempSrc: Oral  SpO2: 100%  Height: 5\' 11"  (1.803 m)   Physical Exam  Constitutional: He is well-developed, well-nourished, and in no distress.  Neck:  He has some tenderness on the left side of his neck/throat  Cardiovascular: Normal rate, regular rhythm and normal heart sounds.   Pulmonary/Chest: Effort normal and breath sounds normal. No respiratory distress.  Abdominal: Soft. Bowel sounds are normal.  Musculoskeletal: He exhibits no edema.  Lymphadenopathy:    He has no cervical adenopathy.  Skin:  Darkened skin over anterior neck c/w post radiation changes  Nursing note and vitals reviewed.  Assessment & Plan:  Glottis carcinoma I stressed the importance of follow-up with specialist  for monitoring of side effects of his post radiation therapy.  As well as evaluation for cancer recurrence. He understands and reports that he will reschedule his appointment with Dr. Basilio [place].  Hypothyroidism I suspect his fatigue and generalized malaise is due to hypothyroidism. We have never been able to get him a steady state on his Synthroid medication due to variable compliance. Today again asked Dr. Selena Batten our clinical pharmacist to assist. I have instructed [name] of the importance of taking the Synthroid medication. We have previously transferred this prescription to [facility] so that it will be $4. Today we will try to attempt to get a 30 day supply from the current pharmacy as well as follow-up paperwork through the med assist program.  Assessment Uncontrolled hypothyroidism  Plan Continue levothyroxine 175 g daily Deferred TSH as it will not change management today  Tobacco use disorder He has not been able to afford the nicotine patches. He is cut down on his own to about a fourth pack a day. I have encouraged him to Quit completely especially as this will allow him to afford life-saving medications like Synthroid.   Medications Ordered Meds ordered this encounter  Medications  . DISCONTD: Oxycodone HCl 10 MG TABS    Sig: Take 1 tablet (10 mg total) by mouth every 8 (eight) hours as needed.    Dispense:  50 tablet    Refill:  0    Rx 1/2 To be filled 30 days after last prescription  . Oxycodone HCl 10 MG TABS    Sig: Take 1 tablet (10 mg  total) by mouth every 8 (eight) hours as needed.    Dispense:  50 tablet    Refill:  0    Rx 2/2 To be filled 30 days after last prescription  . DISCONTD: levothyroxine (SYNTHROID, LEVOTHROID) 175 MCG tablet    Sig: Take 1 tablet (175 mcg total) by mouth daily before breakfast.    Dispense:  30 tablet    Refill:  0    340b Chris  . omeprazole (PRILOSEC) 20 MG capsule    Sig: Take 1 capsule (20 mg total) by mouth daily. Application faxed  today, for questions pls call [phone]    Dispense:  90 capsule    Refill:  3  . albuterol (PROVENTIL HFA;VENTOLIN HFA) 108 (90 Base) MCG/ACT inhaler    Sig: Inhale 1-2 puffs into the lungs every 6 (six) hours as needed for wheezing or shortness of breath. Proventil. Application faxed today, for questions pls call [phone]    Dispense:  18 g    Refill:  3  . levothyroxine (SYNTHROID, LEVOTHROID) 175 MCG tablet    Sig: Take 1 tablet (175 mcg total) by mouth daily before breakfast. Application faxed today, for questions pls call [phone]    Dispense:  90 tablet    Refill:  3   Other Orders No orders of the defined types were placed in this encounter.  Follow Up: Return in about 2 months (around 04/19/2016).

## 2016-02-17 NOTE — Assessment & Plan Note (Signed)
He has not been able to afford the nicotine patches. He is cut down on his own to about a fourth pack a day. I have encouraged him to Quit completely especially as this will allow him to afford life-saving medications like Synthroid.

## 2016-02-18 ENCOUNTER — Encounter: Payer: Self-pay | Admitting: Pharmacist

## 2016-02-18 NOTE — Progress Notes (Signed)
Levothyroxine therapy was reviewed with the patient, including instructions, indication, goals of therapy, potential side effects, importance of adherence, and safe use.  Patient has not been taking levothyroxine, states he is unable to afford. Referred to [facility] outpatient pharmacy and [place] [facility] for further support.   Patient verbalized understanding by repeating back information and was advised to contact me if further medication-related questions arise. Patient was also provided an information handout.

## 2016-02-18 NOTE — Patient Instructions (Signed)
Patient educated about medication as defined in this encounter and verbalized understanding by repeating back instructions provided.   

## 2016-02-29 MED ORDER — ALBUTEROL SULFATE HFA 108 (90 BASE) MCG/ACT IN AERS
1.0000 | INHALATION_SPRAY | Freq: Four times a day (QID) | RESPIRATORY_TRACT | 3 refills | Status: DC | PRN
Start: 2016-02-29 — End: 2016-05-15

## 2016-02-29 NOTE — Addendum Note (Signed)
Addended by: Forde Dandy on: 02/29/2016 01:27 PM   Modules accepted: Orders

## 2016-03-20 MED FILL — oxyCODONE HCL 10 MG TABS: 10 | 16 days supply | Qty: 50 | Fill #0

## 2016-03-21 NOTE — Progress Notes (Signed)
error 

## 2016-03-24 ENCOUNTER — Ambulatory Visit
Admission: RE | Admit: 2016-03-24 | Discharge: 2016-03-24 | Disposition: A | Discharge status: Home / Self Care | Payer: Self-pay | Source: Ambulatory Visit | Attending: Radiation Oncology | Admitting: Radiation Oncology

## 2016-03-24 ENCOUNTER — Telehealth: Payer: Self-pay | Admitting: *Deleted

## 2016-03-24 NOTE — Telephone Encounter (Signed)
CALLED PATIENT TO RESCHEDULE FU APPT. FOR TODAY, PER DR. SQUIRE REQUEST, RESCHEDULED FOR 03-31-16 @ 4:20 PM, LVM FOR A  RETURN CALL

## 2016-03-28 NOTE — Progress Notes (Signed)
error 

## 2016-03-31 ENCOUNTER — Ambulatory Visit
Admission: RE | Admit: 2016-03-31 | Discharge: 2016-03-31 | Disposition: A | Discharge status: Home / Self Care | Payer: Self-pay | Source: Ambulatory Visit | Attending: Radiation Oncology | Admitting: Radiation Oncology

## 2016-04-19 ENCOUNTER — Telehealth: Payer: Self-pay | Admitting: Internal Medicine

## 2016-04-19 NOTE — Telephone Encounter (Signed)
APT. REMINDER CALL, LMTCB °

## 2016-04-20 ENCOUNTER — Encounter: Payer: Self-pay | Admitting: Internal Medicine

## 2016-04-20 ENCOUNTER — Telehealth: Payer: Self-pay | Admitting: Internal Medicine

## 2016-04-20 ENCOUNTER — Other Ambulatory Visit: Payer: Self-pay | Admitting: *Deleted

## 2016-04-20 DIAGNOSIS — C320 Malignant neoplasm of glottis: Secondary | ICD-10-CM

## 2016-04-20 NOTE — Telephone Encounter (Signed)
Pain med refill °

## 2016-04-20 NOTE — Telephone Encounter (Signed)
Wasn't he supposed to have an appointment this morning? It looks like he has now rescheduled for a month later. I have really been trying to make sure he makes it to his appointments, do you know what excuse he gave for rescheduling?

## 2016-04-21 NOTE — Telephone Encounter (Signed)
Pt did not come to appt thurs 9/28 when you would have written his script Today he called wanting to pick up script, stated his car broke down yesterday on the way to appt, but he can come pick up script now Have given him an appt with you tues 10/3 at 1515 Last filled 8/28 for #50, will be out for 4 days on tuesday

## 2016-04-21 NOTE — Telephone Encounter (Signed)
Please see note from today 

## 2016-04-24 NOTE — Telephone Encounter (Signed)
Ok thank you helen 

## 2016-04-25 ENCOUNTER — Ambulatory Visit (INDEPENDENT_AMBULATORY_CARE_PROVIDER_SITE_OTHER): Payer: Self-pay | Admitting: Internal Medicine

## 2016-04-25 VITALS — BP 104/68 | HR 76 | Temp 98.0°F | Ht 71.0 in | Wt 147.6 lb

## 2016-04-25 DIAGNOSIS — R1314 Dysphagia, pharyngoesophageal phase: Secondary | ICD-10-CM

## 2016-04-25 DIAGNOSIS — Z923 Personal history of irradiation: Secondary | ICD-10-CM

## 2016-04-25 DIAGNOSIS — Z79899 Other long term (current) drug therapy: Secondary | ICD-10-CM

## 2016-04-25 DIAGNOSIS — E034 Atrophy of thyroid (acquired): Secondary | ICD-10-CM

## 2016-04-25 DIAGNOSIS — C320 Malignant neoplasm of glottis: Secondary | ICD-10-CM

## 2016-04-25 DIAGNOSIS — R1319 Other dysphagia: Secondary | ICD-10-CM

## 2016-04-25 DIAGNOSIS — R1310 Dysphagia, unspecified: Secondary | ICD-10-CM

## 2016-04-25 MED ORDER — OXYCODONE HCL 10 MG PO TABS: 10.0000 mg | ORAL_TABLET | Freq: Three times a day (TID) | ORAL | 0 refills | Status: DC | PRN

## 2016-04-25 MED FILL — oxyCODONE HCL 10 MG TABS: 10 | 15 days supply | Qty: 45 | Fill #0

## 2016-04-25 NOTE — Progress Notes (Signed)
[name] presents for follow up of radiation completed 06/27/13 to his Larynx.    Pain issues, if any: He reports pain when swallowing. He is taking 10mg  of oxycodone twice daily, morning and night Using a feeding tube?: No Weight changes, if any: He has noticed a weight loss.  Wt Readings from Last 3 Encounters:  04/28/16 142 lb 12.8 oz (64.8 kg)  04/25/16 147 lb 9.6 oz (67 kg)  10/13/15 153 lb 1.6 oz (69.4 kg)   Swallowing issues, if any: He reports having to think about food when he swallows. He is eating anything he wants. He does need to cut his food well, and chew well when swallowing. He is drinking water throughout the day.  Smoking or chewing tobacco? He is smoking about 5-6 cigarettes daily. No smokeless tobacco use. He is not drinking alcohol.  Using fluoride trays daily? N/A Last ENT visit was on: Dr. Wilburn Cornelia, no appointment recently. Other notable issues, if any:  His voice has changed. He tells me "it comes and goes". Some days his voice is normal, and other days he is not able to vocalize.  He is not using his teeth to help him eat.  He reports fatigue, and needs to lay down to rest during the day.  02/17/16 He saw Dr. Heber [place] who called in a prescription for Levothyroxine 175 mcg. Last TSH in [facility] 10/13/15 was 18.740 He has been taking his Levothyroxine consistently over the last month. He tells me he recently had his TSH level checked 04/25/16.  BP 107/89   Pulse 80   Temp 98 F (36.7 C)   Ht 5\' 11"  (1.803 m)   Wt 142 lb 12.8 oz (64.8 kg)   SpO2 100% Comment: room air  BMI 19.92 kg/m

## 2016-04-25 NOTE — Progress Notes (Addendum)
   CC: pain with swallowing  HPI:  Mr.Zi E Pettijohn is a 50 y.o. with a PMH listed below. He presents to clinic for pain management and management of his hypothyroidism.   Please see problem based Assessment and Plan for status of patients chronic conditions.  Past Medical History:  Diagnosis Date  . Headache(784.0)   . Heart murmur    child  . Hx of radiation therapy 05/19/13- 06/27/13   glottic larynx, right true vocal cord 6300 cGy 28 sessions  . Hypothyroidism   . Shortness of breath   . 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   Cardiovascular: Negative for chest pain and palpitations.  Gastrointestinal: Negative for constipation, diarrhea, nausea and vomiting.       Dysphasia    Physical Exam:  Vitals:   04/25/16 1540  BP: 104/68  Pulse: 76  Temp: 98 F (36.7 C)  TempSrc: Oral  SpO2: 100%  Weight: 147 lb 9.6 oz (67 kg)  Height: 5\' 11"  (1.803 m)   Physical Exam  Constitutional: NAD CV: RRR, no murmurs, rubs or gallops appreciated Resp: CTAB Neck: overall increased caliber of anterior neck with minor skin changes consistent with radiation therapy. No overt enlargement of thyroid.   Assessment & Plan:   See Encounters Tab for problem based charting.   Patient seen with Dr. Angelia Mould   Alphonzo Grieve, MD Internal Medicine PGY1

## 2016-04-25 NOTE — Assessment & Plan Note (Signed)
Patient states he has a follow-up appointment with Dr. Isidore Moos for evaluation for cancer recurrence as he has continued hoarseness and dysphasia. He states he has an appointment next Friday with Dr. Isidore Moos.

## 2016-04-25 NOTE — Patient Instructions (Addendum)
We refilled your pain medicine for two months. The first month you will get 45 pills, and the second month you will get 40 pills.   We are rechecking your thyroid level today; I'm glad you've been able to get your medications! That's great!  You will see Dr. Heber [place] in 2 months.

## 2016-04-25 NOTE — Assessment & Plan Note (Signed)
Patient continues to take narcotics twice a day for his dysphasia. He describes that has a burning, grinding sensation with swallowing. He states he has not tried any topical throat sprays or over-the-counter medications for his dysphasia. We discussed continued tapering of his narcotics and patient is in agreement. Patient was encouraged to see Dr. Wynetta Emery again for reevaluation of his dysphasia.  Plan --Patient provided with prescription for oxycodone 10 mg: Month 1-45 tabs; month 2-40 tabs. --Patient follow-up in 2 months with PCP Dr. Heber [place]

## 2016-04-25 NOTE — Assessment & Plan Note (Addendum)
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 Patient has been able to take his levothyroxin 175 g consistently for the past 2 months. With the help of Dr. Maudie Mercury he has his medications mailed and he is able to afford them. Patient states he does have increased energy, feels better overall, and he denies any diarrhea or constipation.  Plan --Check TSH > 74.9; discussed with Dr. Heber [place], will continue synthroid 175 mcg for now --Follow-up with PCP Dr. Heber [place] in 2 months

## 2016-04-26 LAB — TSH: TSH: 74.9 u[IU]/mL — ABNORMAL HIGH (ref 0.450–4.500)

## 2016-04-26 NOTE — Progress Notes (Signed)
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 63 Gray in 28 fractions to larynx completed 06/27/2013    ICD-9-CM ICD-10-CM   1. 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 Vocal cord cancer (HCC) 161.0 C32.0 SLP modified barium swallow     DG Chest 2 View  4. Tobacco use disorder 305.1 F17.200 DG Chest 2 View     Narrative:  The patient returns today for routine follow-up. The patient saw Dr. Jari Favre, a resident of [facility], for pain and hypothyroidism management on 04/25/16. The plan was to check his TSH and adjust Synthroid as needed. With the help of Dr. Maudie Mercury he has his medications mailed and he is able to afford them and follow-up with his PCP, Dr. Heber [place], in 2 months. Dr. Jari Favre discussed tapering the patient's narcotics and encouraged the patient to see Dr. Wynetta Emery again for re-evaluation of the patient's dysphasia (for which he has not tried any topical throat sprays or over-the-counter medications).  Pain issues, if any: He reports pain when swallowing. He is taking 10mg  of oxycodone twice daily, morning and night Using a feeding tube?: No Weight changes, if any: He has noticed a weight loss. Unexplained.    Wt Readings from Last 3 Encounters:  04/28/16 142 lb 12.8 oz (64.8 kg)  04/25/16 147 lb 9.6 oz (67 kg)  10/13/15 153 lb 1.6 oz (69.4 kg)   Swallowing issues, if any: Recurrent dysphagia. He reports having to think about food when he swallows. He is eating anything he wants. He does need to cut his food well and chew well when swallowing. He is drinking water throughout the day.    Smoking or chewing tobacco? He is smoking about 5-6 cigarettes daily. No smokeless tobacco use. He is not drinking alcohol.  Using fluoride trays daily? N/A Last ENT visit was on: Dr. Wilburn Cornelia, no appointment recently. Other notable issues, if any: His voice has changed. He tells me "it comes and goes". Some days his voice is normal and other days he is not able to vocalize. He is not using his teeth to help him eat. He reports fatigue, and needs to lay down to rest during the day.  02/17/16: He saw Dr. Heber [place] who called in a prescription for Levothyroxine 175 mcg. The patient has not been taking his Levothyroxine regularly and therefore despite an abnormal TSH, his PCP kept him at his same dose and will recheck his lab work in a few months. TSH was checked on 04/25/16. He reports worsening dysphagia and horseness that comes and goes.  ALLERGIES:  is allergic to fentanyl; citalopram; and morphine and related.  Meds: Current Outpatient Prescriptions  Medication Sig Dispense Refill  . albuterol (PROVENTIL HFA;VENTOLIN HFA) 108 (90 Base) MCG/ACT inhaler Inhale 1-2 puffs into the lungs every 6 (six) hours as needed for wheezing or shortness of breath. 18 g 3  . levothyroxine (SYNTHROID, LEVOTHROID) 175 MCG tablet Take 1 tablet (175 mcg total) by mouth daily before breakfast. Application faxed today, for questions pls call [id number] 90 tablet 3  . omeprazole (PRILOSEC) 20 MG capsule  Take 1 capsule (20 mg total) by mouth daily. Application faxed today, for questions pls call [id number] 90 capsule 3  . Oxycodone HCl 10 MG TABS Take 1 tablet (10 mg total) by mouth every 8 (eight) hours as needed. 40 tablet 0   No current facility-administered medications for this encounter.     Physical Findings: The patient is in no acute distress. Patient is alert and oriented.  height is 5\' 11"  (1.803 m) and weight is 142 lb 12.8 oz (64.8 kg). His temperature is 98 F (36.7 C). His blood pressure is 107/89 and  his pulse is 80. His oxygen saturation is 100%.   General: In no acute distress. Slightly hoarse. Neck: No palpable adenopathy in the cervical or supraclavicular regions. Skin: Skin intact over his neck.  PROCEDURE NOTE: After anesthetizing the nasal cavity with topical lidocaine and phenylephrine, the flexible endoscope was introduced and passed through the nasal cavity No lesions appreciated in the pharynx, supraglottitis, or glottis. The cords are symmetrically mobile.  Lab Findings: Lab Results  Component Value Date   WBC 5.0 02/21/2015   HGB 12.4 (L) 02/21/2015   HCT 36.6 (L) 02/21/2015   MCV 97.3 02/21/2015   PLT 217 02/21/2015    Lab Results  Component Value Date   TSH 74.900 (H) 04/25/2016    Radiographic Findings: No results found.  Impression/Plan:    1) Head and Neck Cancer Status: NED  2) Nutritional Status: He is losing some weight, but his weight has wax and waned in the past. The patient has lost 11 lbs since March 2017.  3) Risk Factors: The patient has been educated about risk factors including alcohol and tobacco abuse; they understand that avoidance of alcohol and tobacco is important to prevent recurrences as well as other cancers.  The patient continues to smoke 5-6 cigarettes daily without current motivation to quit. Given his smoking history, I will order a chest X-ray.  4) Swallowing: He has recurrent dysphagia. I will order an MBSS and follow up with Dr. Wilburn Cornelia in case of a pharyngeal process.  If Dr. Wilburn Cornelia believes gastroenterology should be involved then a subsequent referral may be made to Dr. Wynetta Emery.  5) Dental: s/p extractions, full (teeth were outside RT field)  6) TSH elevated, addressed by PCP as above in narrative. Lab Results  Component Value Date   TSH 74.900 (H) 04/25/2016    7) Follow-up in 1 year, sooner if needed. The patient is scheduled to see Dr. Heber [place] on 06/22/16. I am referring him to Dr. Wilburn Cornelia for dysphagia  advice and repeat laryngoscopy. He has not done one in some time due to the patient's non-compliance with ENT follow up. I will order a chest X-ray given the patient's history and continue use of smoking. The patient was encouraged to call with any issues or questions before then. _____________________________________   [name], MD  This document serves as a record of services personally performed by [name], MD. It was created on her behalf by Darcus Austin, a trained medical scribe. The creation of this record is based on the scribe's personal observations and the provider's statements to them. This document has been checked and approved by the attending provider.

## 2016-04-28 ENCOUNTER — Ambulatory Visit
Admission: RE | Admit: 2016-04-28 | Discharge: 2016-04-28 | Disposition: A | Discharge status: Home / Self Care | Payer: Self-pay | Source: Ambulatory Visit | Attending: Radiation Oncology | Admitting: Radiation Oncology

## 2016-04-28 ENCOUNTER — Encounter: Payer: Self-pay | Admitting: Radiation Oncology

## 2016-04-28 ENCOUNTER — Encounter: Payer: Self-pay | Admitting: Internal Medicine

## 2016-04-28 ENCOUNTER — Telehealth: Payer: Self-pay | Admitting: Internal Medicine

## 2016-04-28 VITALS — BP 107/89 | HR 80 | Temp 98.0°F | Ht 71.0 in | Wt 142.8 lb

## 2016-04-28 DIAGNOSIS — F17210 Nicotine dependence, cigarettes, uncomplicated: Secondary | ICD-10-CM | POA: Insufficient documentation

## 2016-04-28 DIAGNOSIS — C320 Malignant neoplasm of glottis: Secondary | ICD-10-CM | POA: Insufficient documentation

## 2016-04-28 DIAGNOSIS — E039 Hypothyroidism, unspecified: Secondary | ICD-10-CM | POA: Insufficient documentation

## 2016-04-28 DIAGNOSIS — Z79899 Other long term (current) drug therapy: Secondary | ICD-10-CM | POA: Insufficient documentation

## 2016-04-28 DIAGNOSIS — F17200 Nicotine dependence, unspecified, uncomplicated: Secondary | ICD-10-CM

## 2016-04-28 DIAGNOSIS — R1310 Dysphagia, unspecified: Secondary | ICD-10-CM | POA: Insufficient documentation

## 2016-04-28 MED ORDER — LARYNGOSCOPY SOLUTION RAD-ONC
15.0000 mL | Freq: Once | TOPICAL | Status: AC
  Administered 2016-04-28: 15 mL via TOPICAL
  Filled 2016-04-28: qty 15

## 2016-04-28 NOTE — Telephone Encounter (Signed)
Attempted to contact patient multiple times about test results without answer; no identifying information on VM outgoing message - did not leave message. Will send letter about test results. TSH is still elevated. Discussed with PCP, Dr. Heber [place]; he agrees that we will keep the synthroid at the current dose of 147mcg daily and check TSH again at PCP appointment.   Alphonzo Grieve, MD IMTS - PGY1 Pager [phone]

## 2016-05-01 ENCOUNTER — Telehealth: Payer: Self-pay | Admitting: *Deleted

## 2016-05-01 ENCOUNTER — Other Ambulatory Visit (HOSPITAL_COMMUNITY): Payer: Self-pay | Admitting: Radiation Oncology

## 2016-05-01 DIAGNOSIS — R1310 Dysphagia, unspecified: Secondary | ICD-10-CM

## 2016-05-01 NOTE — Telephone Encounter (Signed)
CALLED PATIENT TO INFORM OF FU WITH DR. Isidore Moos ON 05/04/17 @ 11 AM, LVM FOR A RETURN CALL

## 2016-05-01 NOTE — Telephone Encounter (Signed)
CALLED PATIENT TO INFORM OF MODIFIED BARIUM SWALLOW ON 05-16-16- ARRIVAL TIME - 12:45 PM AND HE WILL HAVE HIS CHEST X-RAY DONE ON 05-16-16 ALSO, SPOKE WITH PATIENT AND HE IS AWARE OF THESE TESTS

## 2016-05-10 ENCOUNTER — Telehealth: Payer: Self-pay | Admitting: *Deleted

## 2016-05-10 NOTE — Telephone Encounter (Signed)
Oncology Nurse Navigator Documentation  Per Dr. Pearlie Oyster guidance, called [facility] to arrange follow-up visit.  Spoke with Michel Bickers, requested that patient be contacted and appt arranged to see Dr. Wilburn Cornelia next available following 05/16/16 MBSS noted Mr. Holligan needs laryngoscopy and assessment for dysphagia.       She verbalized understanding.  Gayleen Orem, RN, BSN, [place] at [place] [phone]

## 2016-05-10 NOTE — Telephone Encounter (Signed)
Oncology Nurse Navigator Documentation  LVMM for [name] reminding him of his 05/16/16 1300/1245 arrival for MBSS at [facility] next Tuesday, informed him he can expect call from Dr. Victorio Palm office to schedule follow-up appt subsequent to MBSS to further evaluate dysphagia he reported during follow-up with Dr. Isidore Moos earlier this month.  Gayleen Orem, RN, BSN, [place] at [place] [phone]

## 2016-05-12 ENCOUNTER — Telehealth: Payer: Self-pay | Admitting: Pharmacist

## 2016-05-12 DIAGNOSIS — J439 Emphysema, unspecified: Secondary | ICD-10-CM

## 2016-05-12 NOTE — Progress Notes (Signed)
Returning patient message. No answer, left patient a message, will call back

## 2016-05-15 MED ORDER — ALBUTEROL SULFATE HFA 108 (90 BASE) MCG/ACT IN AERS: 1.0000 | INHALATION_SPRAY | Freq: Four times a day (QID) | RESPIRATORY_TRACT | 3 refills | Status: DC | PRN

## 2016-05-15 NOTE — Progress Notes (Signed)
Patient requested transfer of albuterol prescription to [place]. Prescription sent.

## 2016-05-15 NOTE — Addendum Note (Signed)
Addended by: Forde Dandy on: 05/15/2016 03:47 PM   Modules accepted: Orders

## 2016-05-16 ENCOUNTER — Ambulatory Visit (HOSPITAL_COMMUNITY)
Admission: RE | Admit: 2016-05-16 | Discharge: 2016-05-16 | Disposition: A | Discharge status: Home / Self Care | Payer: Self-pay | Source: Ambulatory Visit | Attending: Radiation Oncology | Admitting: Radiation Oncology

## 2016-05-16 ENCOUNTER — Ambulatory Visit: Payer: Self-pay | Admitting: Pharmacist

## 2016-05-16 DIAGNOSIS — R1310 Dysphagia, unspecified: Secondary | ICD-10-CM | POA: Insufficient documentation

## 2016-05-16 DIAGNOSIS — C320 Malignant neoplasm of glottis: Secondary | ICD-10-CM

## 2016-05-16 DIAGNOSIS — F17200 Nicotine dependence, unspecified, uncomplicated: Secondary | ICD-10-CM

## 2016-05-17 ENCOUNTER — Telehealth: Payer: Self-pay

## 2016-05-17 NOTE — Telephone Encounter (Signed)
I called and spoke to [name]. I let him know that his recent CXR was stable with no sign of cancer at the request of Dr. Isidore Moos. He voiced his appreciation. He also informed me that he plans to call Dr. Wilburn Cornelia to schedule an appointment with him. He knows to call if he has any further questions.

## 2016-05-18 ENCOUNTER — Encounter: Payer: Self-pay | Admitting: Internal Medicine

## 2016-05-24 ENCOUNTER — Telehealth: Payer: Self-pay

## 2016-05-24 NOTE — Telephone Encounter (Signed)
Bethena Roys from the pharmacy needs to speak with a nurse regarding oxycodone.

## 2016-05-24 NOTE — Telephone Encounter (Signed)
Pharm verifying that  Narcotic must wait 30 days

## 2016-05-25 MED FILL — oxyCODONE HCL 10 MG TABS: 10 | 13 days supply | Qty: 40 | Fill #0

## 2016-06-21 ENCOUNTER — Telehealth: Payer: Self-pay | Admitting: Internal Medicine

## 2016-06-21 NOTE — Telephone Encounter (Signed)
APT. REMINDER CALL, LMTCB °

## 2016-06-22 ENCOUNTER — Ambulatory Visit (INDEPENDENT_AMBULATORY_CARE_PROVIDER_SITE_OTHER): Payer: Self-pay | Admitting: Internal Medicine

## 2016-06-22 ENCOUNTER — Telehealth: Payer: Self-pay

## 2016-06-22 ENCOUNTER — Encounter: Payer: Self-pay | Admitting: Internal Medicine

## 2016-06-22 VITALS — BP 114/67 | HR 82 | Temp 97.5°F | Ht 71.0 in | Wt 142.4 lb

## 2016-06-22 DIAGNOSIS — E039 Hypothyroidism, unspecified: Secondary | ICD-10-CM

## 2016-06-22 DIAGNOSIS — Z79899 Other long term (current) drug therapy: Secondary | ICD-10-CM

## 2016-06-22 DIAGNOSIS — R1310 Dysphagia, unspecified: Secondary | ICD-10-CM

## 2016-06-22 DIAGNOSIS — R490 Dysphonia: Secondary | ICD-10-CM

## 2016-06-22 DIAGNOSIS — F17210 Nicotine dependence, cigarettes, uncomplicated: Secondary | ICD-10-CM

## 2016-06-22 DIAGNOSIS — R1319 Other dysphagia: Secondary | ICD-10-CM

## 2016-06-22 DIAGNOSIS — C320 Malignant neoplasm of glottis: Secondary | ICD-10-CM

## 2016-06-22 DIAGNOSIS — E034 Atrophy of thyroid (acquired): Secondary | ICD-10-CM

## 2016-06-22 DIAGNOSIS — Z0000 Encounter for general adult medical examination without abnormal findings: Secondary | ICD-10-CM

## 2016-06-22 MED ORDER — OXYCODONE HCL 10 MG PO TABS: 10.0000 mg | ORAL_TABLET | Freq: Three times a day (TID) | ORAL | 0 refills | Status: DC | PRN

## 2016-06-22 MED FILL — oxyCODONE HCL 10 MG TABS: 10 | 13 days supply | Qty: 40 | Fill #0

## 2016-06-22 NOTE — Progress Notes (Signed)
[facility] Subjective:  HPI: [name] is a 50 y.o. male who presents for follow up of throat pain, hypothyroidism.  Please see problem based charting below for the status of his chronic medical problems     Review of Systems: Review of Systems  Constitutional: Negative for fever and malaise/fatigue.  HENT: Positive for sore throat.   Respiratory: Negative for shortness of breath and wheezing.   Cardiovascular: Negative for chest pain and leg swelling.  Gastrointestinal: Negative for constipation and diarrhea.    Objective:  Physical Exam: Vitals:   06/22/16 1005  BP: 114/67  Pulse: 82  Temp: 97.5 F (36.4 C)  TempSrc: Oral  SpO2: 100%  Weight: 142 lb 6.4 oz (64.6 kg)  Height: 5\' 11"  (1.803 m)   Physical Exam  Constitutional: He is well-developed, well-nourished, and in no distress.  Cardiovascular: Normal rate and regular rhythm.   Pulmonary/Chest: Effort normal and breath sounds normal.  Abdominal: Soft. Bowel sounds are normal.  Musculoskeletal: He exhibits no edema.  Nursing note and vitals reviewed.  Assessment & Plan:  Glottis carcinoma HPI: He fortunately has seen Dr Isidore Moos since our last visit.  She ordered a SLP evaluation and MBS, this has returned technically normal however he was noted to have pain and increased work to swallow and it was consider that he may have some degree of fibrosis of muscle.  Dr Isidore Moos has recommended follow up with Dr Wilburn Cornelia (ENT) and depending on his evaluation repeat evaluation by GI (Dr Wynetta Emery).  He reports that he has continue to have hoarseness, and difficulty (painful swallowing).  I have decreased his pain medication for the last few visits with the last prescription for only 40 pills of oxycodone.  He reports that he typically takes the medication in the morning, he notes it is most effective if he takes 2 pills.  This has lead him to run out early.  A: Hx of Glottis  carcinoma  P: Referral to ENT Will continue Oxycodone 10mg  #40 pills this month.  Discussed that due to his abnormal esophogeal manometry we really need to wean him off completely. And will decrease to 35 pills next month.  Esophageal dysphagia A: Esophagel dysphagia  P:Will await ENT referral may need repeat GI evaluation, would like to get him off oxycodone completely.  Hypothyroidism HPI: He reports 100% compliance with synthroid 141mcg a day since we have been able to get his medication from [place] [facility].  He denies any palpatiation, changes in hair, nails, or sleep patters.  He has lost weight since our last visit.  A: Hypothyroidism  P: Check TSH>>> returned 0.049, given the change from 74 at last check and his body weight I suspect he may be on too high of a dose of synthroid.  We now know that he does indeed absorb synthoid and it is effective when taken.  Therefore I will decrease him to 149mcg which is more in line with his estimated need (~115mcg)  Hoarseness of voice A: Hoarseness of voice  P: Repeat Evaluation with ENT   Medications Ordered Meds ordered this encounter  Medications  . DISCONTD: Oxycodone HCl 10 MG TABS    Sig: Take 1 tablet (10 mg total) by mouth every 8 (eight) hours as needed.    Dispense:  40 tablet    Refill:  0    Rx1/2 To be filled 30 days after last prescription  . Oxycodone HCl 10 MG TABS    Sig: Take  1 tablet (10 mg total) by mouth every 8 (eight) hours as needed.    Dispense:  35 tablet    Refill:  0    Rx2/2 To be filled 30 days after last prescription  . levothyroxine (SYNTHROID, LEVOTHROID) 125 MCG tablet    Sig: Take 1 tablet (125 mcg total) by mouth daily before breakfast. Void previous prescription on file for levothyroxine    Dispense:  90 tablet    Refill:  0   Other Orders Orders Placed This Encounter  Procedures  . TSH  . HIV antibody (with reflex)  . Hepatitis C antibody  . ToxAssure Select,+Antidepr,UR    Standing  Status:   Future    Standing Expiration Date:   06/22/2017  . Ambulatory referral to ENT    Referral Priority:   Routine    Referral Type:   Consultation    Referral Reason:   Specialty Services Required    Requested Specialty:   Otolaryngology    Number of Visits Requested:   1   Follow Up: No Follow-up on file.

## 2016-06-22 NOTE — Telephone Encounter (Signed)
Talked to Dr Heber Anderson - stated ok to fill Oxycodone rx early. Called and talked to pharmacy at [facility] - gave ok to fill rx early.

## 2016-06-22 NOTE — Telephone Encounter (Signed)
Please call pt back regarding oxycodone.  

## 2016-06-22 NOTE — Patient Instructions (Signed)
I will call you with the results from the labs.  I want you to see Dr Wilburn Cornelia again for follow up.

## 2016-06-22 NOTE — Telephone Encounter (Signed)
Bethena Roys from [facility] asking to speak with a nurse regarding oxycodone. Please call back

## 2016-06-23 LAB — HIV ANTIBODY (ROUTINE TESTING W REFLEX): HIV Screen 4th Generation wRfx: NONREACTIVE

## 2016-06-23 LAB — TSH: TSH: 0.449 u[IU]/mL — ABNORMAL LOW (ref 0.450–4.500)

## 2016-06-23 LAB — HEPATITIS C ANTIBODY: Hep C Virus Ab: 0.1 s/co ratio (ref 0.0–0.9)

## 2016-06-23 MED ORDER — LEVOTHYROXINE SODIUM 125 MCG PO TABS: 125.0000 ug | ORAL_TABLET | Freq: Every day | ORAL | 0 refills | Status: DC

## 2016-06-29 NOTE — Assessment & Plan Note (Signed)
A: Esophagel dysphagia  P:Will await ENT referral may need repeat GI evaluation, would like to get him off oxycodone completely.

## 2016-06-29 NOTE — Assessment & Plan Note (Signed)
HPI: He fortunately has seen Dr Isidore Moos since our last visit.  She ordered a SLP evaluation and MBS, this has returned technically normal however he was noted to have pain and increased work to swallow and it was consider that he may have some degree of fibrosis of muscle.  Dr Isidore Moos has recommended follow up with Dr Wilburn Cornelia (ENT) and depending on his evaluation repeat evaluation by GI (Dr Wynetta Emery).  He reports that he has continue to have hoarseness, and difficulty (painful swallowing).  I have decreased his pain medication for the last few visits with the last prescription for only 40 pills of oxycodone.  He reports that he typically takes the medication in the morning, he notes it is most effective if he takes 2 pills.  This has lead him to run out early.  A: Hx of Glottis carcinoma  P: Referral to ENT Will continue Oxycodone 10mg  #40 pills this month.  Discussed that due to his abnormal esophogeal manometry we really need to wean him off completely. And will decrease to 35 pills next month.

## 2016-06-29 NOTE — Assessment & Plan Note (Signed)
HPI: He reports 100% compliance with synthroid 154mcg a day since we have been able to get his medication from [facility].  He denies any palpatiation, changes in hair, nails, or sleep patters.  He has lost weight since our last visit.  A: Hypothyroidism  P: Check TSH>>> returned 0.049, given the change from 74 at last check and his body weight I suspect he may be on too high of a dose of synthroid.  We now know that he does indeed absorb synthoid and it is effective when taken.  Therefore I will decrease him to 121mcg which is more in line with his estimated need (~179mcg)

## 2016-06-29 NOTE — Assessment & Plan Note (Signed)
A: Hoarseness of voice  P: Repeat Evaluation with ENT

## 2016-07-14 ENCOUNTER — Telehealth: Payer: Self-pay | Admitting: Internal Medicine

## 2016-07-14 NOTE — Telephone Encounter (Signed)
CALLED PT. LMTCB, TIME TO RENEW GCCN CARD

## 2016-08-21 ENCOUNTER — Other Ambulatory Visit: Payer: Self-pay

## 2016-08-21 DIAGNOSIS — C320 Malignant neoplasm of glottis: Secondary | ICD-10-CM

## 2016-08-21 NOTE — Telephone Encounter (Signed)
Last office visit: 06/22/2017 Last UDS: 03/11/2015 (future order on file) Last Refill: pt given 2 rxs during 06/22/16 OV (which will make him due around 08/22/2016) Next appt: none scheduled

## 2016-08-21 NOTE — Telephone Encounter (Signed)
Oxycodone HCl 10 MG TABS, refill request. 

## 2016-08-22 MED ORDER — OXYCODONE HCL 10 MG PO TABS: 10.0000 mg | ORAL_TABLET | Freq: Every day | ORAL | 0 refills | Status: DC | PRN

## 2016-08-22 NOTE — Telephone Encounter (Signed)
I have stressed with [name] the need for regular follow up appointments for narcotic medications.  Today I will allow for another prescription but will continue the taper down to 30 pills.  He needs to come in to see me before the next Rx.  I am fine with adding him on to overflow or double booking if needed.

## 2016-08-22 NOTE — Telephone Encounter (Signed)
Called pt, lm for rtc 

## 2016-08-23 MED FILL — oxyCODONE HCL 10 MG TABS: 10 | 30 days supply | Qty: 30 | Fill #0

## 2016-08-23 NOTE — Telephone Encounter (Addendum)
Will also make pt an appt for 09/21/2016 w/pcp.  Will attach appt to prescription.Despina Hidden Cassady1/31/20181:56 PM

## 2016-08-24 ENCOUNTER — Telehealth: Payer: Self-pay | Admitting: Internal Medicine

## 2016-08-24 NOTE — Telephone Encounter (Signed)
CALLED PT, LMTCB, TIME TO RENEW GCCN CARD

## 2016-08-30 NOTE — Addendum Note (Signed)
Addended by: Truddie Crumble on: 08/30/2016 11:38 AM   Modules accepted: Orders

## 2016-09-12 ENCOUNTER — Encounter: Payer: Self-pay | Admitting: *Deleted

## 2016-09-15 ENCOUNTER — Other Ambulatory Visit: Payer: Self-pay

## 2016-09-15 DIAGNOSIS — C320 Malignant neoplasm of glottis: Secondary | ICD-10-CM

## 2016-09-15 NOTE — Telephone Encounter (Signed)
Oxycodone HCl 10 MG TABS, Refill request.

## 2016-09-20 NOTE — Telephone Encounter (Signed)
Needs to speak with a nurse regarding pain med. Please call back.

## 2016-09-21 ENCOUNTER — Encounter: Payer: Self-pay | Admitting: Internal Medicine

## 2016-09-21 MED ORDER — OXYCODONE HCL 10 MG PO TABS: 10.0000 mg | ORAL_TABLET | Freq: Every day | ORAL | 0 refills | Status: DC | PRN

## 2016-09-21 NOTE — Telephone Encounter (Signed)
I did not realize at first the cancellation was due to my schedule.  So I will provide him with a refill, I will bring it down later today.

## 2016-09-21 NOTE — Telephone Encounter (Signed)
[name] really does need to be seen, he has a long history of no shows that has limited his medical care.

## 2016-09-21 NOTE — Telephone Encounter (Signed)
Pt informed

## 2016-09-21 NOTE — Telephone Encounter (Signed)
Will continue taper, decrease by 5 pills.

## 2016-09-21 NOTE — Telephone Encounter (Signed)
Read your note. You need to see pt before refill.  He has appt today with you but was cancelled & now rescheduled for 3/22. Do you want him to come in for [facility] appt?  Patient is out of his pain meds. Pls advice!

## 2016-09-22 MED FILL — oxyCODONE HCL 10 MG TABS: 10 | 30 days supply | Qty: 25 | Fill #0

## 2016-09-28 ENCOUNTER — Telehealth: Payer: Self-pay | Admitting: *Deleted

## 2016-09-28 NOTE — Telephone Encounter (Signed)
On 09-28-16 fax medical records to [place] law office. It was Dr Isidore Moos office notes

## 2016-10-11 ENCOUNTER — Telehealth: Payer: Self-pay | Admitting: Internal Medicine

## 2016-10-11 ENCOUNTER — Encounter: Payer: Self-pay | Admitting: Internal Medicine

## 2016-10-11 DIAGNOSIS — Z79891 Long term (current) use of opiate analgesic: Secondary | ICD-10-CM

## 2016-10-11 HISTORY — DX: Long term (current) use of opiate analgesic: Z79.891

## 2016-10-11 NOTE — Telephone Encounter (Signed)
APT. REMINDER CALL, LMTCB °

## 2016-10-11 NOTE — Progress Notes (Deleted)
[facility] Subjective:  HPI: [name] is a 51 y.o. male who presents for ***     Review of Systems: *** Objective:  Physical Exam: There were no vitals filed for this visit. *** Assessment & Plan:  No problem-specific Assessment & Plan notes found for this encounter.   Medications Ordered No orders of the defined types were placed in this encounter.  Other Orders No orders of the defined types were placed in this encounter.  Follow Up: No Follow-up on file.

## 2016-10-12 ENCOUNTER — Encounter: Payer: Self-pay | Admitting: Internal Medicine

## 2016-10-23 ENCOUNTER — Other Ambulatory Visit: Payer: Self-pay

## 2016-10-23 DIAGNOSIS — C320 Malignant neoplasm of glottis: Secondary | ICD-10-CM

## 2016-10-23 NOTE — Telephone Encounter (Signed)
Oxycodone HCl 10 MG TABS, refill request.

## 2016-10-23 NOTE — Telephone Encounter (Signed)
Rx last written 09/21/16. Last OV was 06/22/16 w/Dr Heber [place]. No f/u appt scheduled. UDS 03/11/15.  Called pt - stated he's basically homeless since November - nowhere to stay/no transportation. He did schedule an appt for May.

## 2016-10-24 ENCOUNTER — Telehealth: Payer: Self-pay | Admitting: *Deleted

## 2016-10-24 NOTE — Telephone Encounter (Signed)
Bonnita Nasuti stated she talked mto pt this morning.

## 2016-10-24 NOTE — Telephone Encounter (Signed)
Pt rtc to "person that called me", read previous note, informed pt that the narcotic was denied and reasons being. He was not happy and somewhat sarcastic but nurse informed him that missed appts due to transportation but being able to come to clinic in appr 25 mins usually to pick up scripts was not acceptable and seemed odd, he laughed and stated "yeah, it works out that way". States he is walking from [place] to [place] right now and could dr Heber [place] see him, spoke to dr Heber Wise dr Heber Roberts will see him either tomorrow am or Friday pm, pt states he will be here in clinic tomorrow am at [place]. She will make template for dr Heber  and remove dr j. Hoffman appt

## 2016-10-24 NOTE — Telephone Encounter (Signed)
Called pt - no answer; left message to give [place] a call to explain refill denial.

## 2016-10-24 NOTE — Telephone Encounter (Signed)
I cannot refill the oxycodone, it was refilled last month to allow him to be seen but he missed the appointment.  He has a long history of No shows.

## 2016-10-25 ENCOUNTER — Encounter: Payer: Self-pay | Admitting: Internal Medicine

## 2016-10-25 ENCOUNTER — Ambulatory Visit (INDEPENDENT_AMBULATORY_CARE_PROVIDER_SITE_OTHER): Payer: Self-pay | Admitting: Internal Medicine

## 2016-10-25 VITALS — BP 110/68 | HR 83 | Temp 97.7°F | Wt 150.3 lb

## 2016-10-25 DIAGNOSIS — Z5900 Homelessness unspecified: Secondary | ICD-10-CM | POA: Insufficient documentation

## 2016-10-25 DIAGNOSIS — Z79891 Long term (current) use of opiate analgesic: Secondary | ICD-10-CM

## 2016-10-25 DIAGNOSIS — Z923 Personal history of irradiation: Secondary | ICD-10-CM

## 2016-10-25 DIAGNOSIS — F17210 Nicotine dependence, cigarettes, uncomplicated: Secondary | ICD-10-CM

## 2016-10-25 DIAGNOSIS — Z9119 Patient's noncompliance with other medical treatment and regimen: Secondary | ICD-10-CM

## 2016-10-25 DIAGNOSIS — J439 Emphysema, unspecified: Secondary | ICD-10-CM

## 2016-10-25 DIAGNOSIS — Z598 Other problems related to housing and economic circumstances: Secondary | ICD-10-CM

## 2016-10-25 DIAGNOSIS — C320 Malignant neoplasm of glottis: Secondary | ICD-10-CM

## 2016-10-25 DIAGNOSIS — Z8589 Personal history of malignant neoplasm of other organs and systems: Secondary | ICD-10-CM

## 2016-10-25 DIAGNOSIS — Z8521 Personal history of malignant neoplasm of larynx: Secondary | ICD-10-CM

## 2016-10-25 DIAGNOSIS — E034 Atrophy of thyroid (acquired): Secondary | ICD-10-CM

## 2016-10-25 DIAGNOSIS — R1319 Other dysphagia: Secondary | ICD-10-CM

## 2016-10-25 DIAGNOSIS — Z79899 Other long term (current) drug therapy: Secondary | ICD-10-CM

## 2016-10-25 DIAGNOSIS — R490 Dysphonia: Secondary | ICD-10-CM

## 2016-10-25 DIAGNOSIS — R1310 Dysphagia, unspecified: Secondary | ICD-10-CM

## 2016-10-25 MED ORDER — OXYCODONE HCL 10 MG PO TABS: 10.0000 mg | ORAL_TABLET | Freq: Every day | ORAL | 0 refills | Status: DC | PRN

## 2016-10-25 MED FILL — oxyCODONE HCL 10 MG TABS: 10 | 15 days supply | Qty: 15 | Fill #0

## 2016-10-25 NOTE — Assessment & Plan Note (Signed)
-   Will need to follow up with dr Wynetta Emery, I hope he will be completely off narcotics in the next 2 months.

## 2016-10-25 NOTE — Assessment & Plan Note (Signed)
-  Continue albtuterol PRN

## 2016-10-25 NOTE — Assessment & Plan Note (Signed)
-  Recheck TSH - Continue Synthroid 154mcg daily

## 2016-10-25 NOTE — Progress Notes (Signed)
[facility] Subjective:  HPI: [name] is a 51 y.o. male who presents for follow up of Hypothyroidism and dysphagia.  He continues to have some hoarseness and dysphagia.  He does not think he has lost any weight.  He feels that the oxycodone helps, he has been taking it once a day, has not had any for the last 4-5 days.  He reports he has been taking omeprazole.  He has been taking his levothyroxine, he thinks he only went down to the 133mcg dose about 1-2 months ago. (he was previously on 160mcg but not very adherent.  He denies any worsening fatigue, changes in sleep, hair, nails.  He continues to smoke, reports about 4-5 cigarettes a day, trying to cut down.  Occasionally gets SOB, uses albuterol as needed.  He reports that he is now homeless, has been staying with friends. He is having trouble finding work. He has contacted a Chief Executive Officer and working on obtaining disability,   Review of Systems: Per HPI Objective:  Physical Exam: Vitals:   10/25/16 0843  BP: 110/68  Pulse: 83  Temp: 97.7 F (36.5 C)  TempSrc: Oral  SpO2: 100%  Weight: 150 lb 4.8 oz (68.2 kg)  Physical Exam  Constitutional: He is well-developed, well-nourished, and in no distress.  Smells of heavy smoking  HENT:  Mouth/Throat: Oropharynx is clear and moist.  Eyes: Conjunctivae are normal.  Cardiovascular: Normal rate, regular rhythm and normal heart sounds.   Pulmonary/Chest: Effort normal and breath sounds normal. He has no wheezes.  Musculoskeletal: He exhibits no edema.  Nursing note and vitals reviewed.   Assessment & Plan:  Hypothyroidism due to acquired atrophy of thyroid -Recheck TSH - Continue Synthroid 184mcg daily  Esophageal dysphagia - Will need to follow up with dr Wynetta Emery, I hope he will be completely off narcotics in the next 2 months.  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 I cannot see much clear benefit at this point and we have been working to discontinue this as it may be contributing to his dysphasia.  He is concerned about stopping abruptly.  I will provide him with 15 pills of oxycodone 10mg  this month and 10 pills for next month and have him plan to stop completely after that.  Homelessness -Referral to social work  I also discussed with him today his frequent no shows at our appointments,  I stressed to him the importance of regular follow up and that no show appointments mean that other people cannot get seen at our clinic.  I have informed him that he has already meet criteria for dismissal from our clinic.  I informed him he will be dismissed if he misses further appointments.  Medications Ordered Meds ordered this encounter  Medications  . DISCONTD: Oxycodone HCl 10 MG TABS    Sig: Take 1 tablet (10 mg total) by mouth daily as needed.    Dispense:  15 tablet    Refill:  0    Rx1/2 To be filled 30 days after last prescription  . Oxycodone HCl 10 MG TABS    Sig: Take 1 tablet (10 mg total) by mouth daily as needed.    Dispense:  10 tablet    Refill:  0    Rx2/2  To be filled 30 days after last prescription   Other Orders Orders Placed This Encounter  Procedures  . TSH  . Ambulatory referral to Social Work    Referral Priority:   Routine    Referral Type:   Consultation    Referral Reason:   Specialty Services Required    Number of Visits Requested:   1   Follow Up: Return in about 3 months (around 01/24/2017).

## 2016-10-25 NOTE — Assessment & Plan Note (Signed)
-  Follow up with Dr Isidore Moos in October

## 2016-10-25 NOTE — Assessment & Plan Note (Signed)
-   Patient reports he has been taking pain medication daily. I cannot see much clear benefit at this point and we have been working to discontinue this as it may be contributing to his dysphasia.  He is concerned about stopping abruptly.  I will provide him with 15 pills of oxycodone 10mg  this month and 10 pills for next month and have him plan to stop completely after that.

## 2016-10-25 NOTE — Patient Instructions (Signed)
I will call with the results of you thyroid level and adjust your medication if needed.

## 2016-10-25 NOTE — Assessment & Plan Note (Signed)
-  Referral to social work 

## 2016-10-26 LAB — TSH: TSH: 63.19 u[IU]/mL — AB (ref 0.450–4.500)

## 2016-10-27 MED ORDER — LEVOTHYROXINE SODIUM 175 MCG PO TABS: 175.0000 ug | ORAL_TABLET | Freq: Every day | ORAL | 3 refills | Status: DC

## 2016-10-27 NOTE — Addendum Note (Signed)
Addended by: Joni Reining C on: 10/27/2016 11:08 AM   Modules accepted: Orders

## 2016-11-02 NOTE — Telephone Encounter (Signed)
Helen can you please close this enounter °

## 2016-11-30 ENCOUNTER — Encounter: Payer: Self-pay | Admitting: Internal Medicine

## 2017-03-19 ENCOUNTER — Emergency Department (HOSPITAL_COMMUNITY): Payer: Self-pay

## 2017-03-19 ENCOUNTER — Inpatient Hospital Stay (HOSPITAL_COMMUNITY)
Admission: EM | Admit: 2017-03-19 | Discharge: 2017-03-27 | DRG: 917 | Disposition: A | Discharge status: Home / Self Care | Payer: Self-pay | Attending: Family Medicine | Admitting: Family Medicine

## 2017-03-19 ENCOUNTER — Inpatient Hospital Stay (HOSPITAL_COMMUNITY): Payer: Self-pay

## 2017-03-19 ENCOUNTER — Encounter (HOSPITAL_COMMUNITY): Payer: Self-pay | Admitting: Emergency Medicine

## 2017-03-19 DIAGNOSIS — R4189 Other symptoms and signs involving cognitive functions and awareness: Secondary | ICD-10-CM

## 2017-03-19 DIAGNOSIS — F17210 Nicotine dependence, cigarettes, uncomplicated: Secondary | ICD-10-CM | POA: Diagnosis present

## 2017-03-19 DIAGNOSIS — E875 Hyperkalemia: Secondary | ICD-10-CM | POA: Diagnosis present

## 2017-03-19 DIAGNOSIS — Z681 Body mass index (BMI) 19 or less, adult: Secondary | ICD-10-CM

## 2017-03-19 DIAGNOSIS — Z4659 Encounter for fitting and adjustment of other gastrointestinal appliance and device: Secondary | ICD-10-CM

## 2017-03-19 DIAGNOSIS — J9601 Acute respiratory failure with hypoxia: Secondary | ICD-10-CM | POA: Diagnosis present

## 2017-03-19 DIAGNOSIS — N179 Acute kidney failure, unspecified: Secondary | ICD-10-CM | POA: Diagnosis present

## 2017-03-19 DIAGNOSIS — F1123 Opioid dependence with withdrawal: Secondary | ICD-10-CM | POA: Diagnosis present

## 2017-03-19 DIAGNOSIS — G9340 Encephalopathy, unspecified: Secondary | ICD-10-CM | POA: Diagnosis present

## 2017-03-19 DIAGNOSIS — H9190 Unspecified hearing loss, unspecified ear: Secondary | ICD-10-CM | POA: Diagnosis present

## 2017-03-19 DIAGNOSIS — F419 Anxiety disorder, unspecified: Secondary | ICD-10-CM | POA: Diagnosis present

## 2017-03-19 DIAGNOSIS — E039 Hypothyroidism, unspecified: Secondary | ICD-10-CM | POA: Diagnosis present

## 2017-03-19 DIAGNOSIS — T50904A Poisoning by unspecified drugs, medicaments and biological substances, undetermined, initial encounter: Secondary | ICD-10-CM

## 2017-03-19 DIAGNOSIS — Z888 Allergy status to other drugs, medicaments and biological substances status: Secondary | ICD-10-CM

## 2017-03-19 DIAGNOSIS — Z8249 Family history of ischemic heart disease and other diseases of the circulatory system: Secondary | ICD-10-CM

## 2017-03-19 DIAGNOSIS — Z9289 Personal history of other medical treatment: Secondary | ICD-10-CM

## 2017-03-19 DIAGNOSIS — Z8521 Personal history of malignant neoplasm of larynx: Secondary | ICD-10-CM

## 2017-03-19 DIAGNOSIS — Z452 Encounter for adjustment and management of vascular access device: Secondary | ICD-10-CM

## 2017-03-19 DIAGNOSIS — R579 Shock, unspecified: Secondary | ICD-10-CM

## 2017-03-19 DIAGNOSIS — I959 Hypotension, unspecified: Secondary | ICD-10-CM

## 2017-03-19 DIAGNOSIS — F11221 Opioid dependence with intoxication delirium: Secondary | ICD-10-CM | POA: Diagnosis present

## 2017-03-19 DIAGNOSIS — J9811 Atelectasis: Secondary | ICD-10-CM | POA: Diagnosis present

## 2017-03-19 DIAGNOSIS — Z885 Allergy status to narcotic agent status: Secondary | ICD-10-CM

## 2017-03-19 DIAGNOSIS — G8929 Other chronic pain: Secondary | ICD-10-CM | POA: Diagnosis present

## 2017-03-19 DIAGNOSIS — T401X1A Poisoning by heroin, accidental (unintentional), initial encounter: Principal | ICD-10-CM | POA: Diagnosis present

## 2017-03-19 DIAGNOSIS — R578 Other shock: Secondary | ICD-10-CM | POA: Diagnosis present

## 2017-03-19 DIAGNOSIS — Z923 Personal history of irradiation: Secondary | ICD-10-CM

## 2017-03-19 LAB — MAGNESIUM: MAGNESIUM: 1.6 mg/dL — AB (ref 1.7–2.4)

## 2017-03-19 LAB — CBC WITH DIFFERENTIAL/PLATELET
BASOS PCT: 0 %
Basophils Absolute: 0 10*3/uL (ref 0.0–0.1)
EOS ABS: 0.1 10*3/uL (ref 0.0–0.7)
EOS PCT: 2 %
HCT: 38.4 % — ABNORMAL LOW (ref 39.0–52.0)
HEMOGLOBIN: 12.7 g/dL — AB (ref 13.0–17.0)
LYMPHS ABS: 0.7 10*3/uL (ref 0.7–4.0)
Lymphocytes Relative: 18 %
MCH: 31.8 pg (ref 26.0–34.0)
MCHC: 33.1 g/dL (ref 30.0–36.0)
MCV: 96.2 fL (ref 78.0–100.0)
MONO ABS: 0.1 10*3/uL (ref 0.1–1.0)
MONOS PCT: 3 %
Neutro Abs: 3.1 10*3/uL (ref 1.7–7.7)
Neutrophils Relative %: 77 %
Platelets: 144 10*3/uL — ABNORMAL LOW (ref 150–400)
RBC: 3.99 MIL/uL — ABNORMAL LOW (ref 4.22–5.81)
RDW: 15 % (ref 11.5–15.5)
WBC: 4 10*3/uL (ref 4.0–10.5)

## 2017-03-19 LAB — I-STAT VENOUS BLOOD GAS, ED
Acid-Base Excess: 1 mmol/L (ref 0.0–2.0)
Acid-base deficit: 1 mmol/L (ref 0.0–2.0)
BICARBONATE: 26.1 mmol/L (ref 20.0–28.0)
Bicarbonate: 24.3 mmol/L (ref 20.0–28.0)
O2 SAT: 100 %
O2 Saturation: 99 %
PCO2 VEN: 40.4 mmHg — AB (ref 44.0–60.0)
PH VEN: 7.419 (ref 7.250–7.430)
PO2 VEN: 122 mmHg — AB (ref 32.0–45.0)
TCO2: 27 mmol/L (ref 22–32)
TCO2: 26 mmol/L (ref 22–32)
pCO2, Ven: 40.5 mmHg — ABNORMAL LOW (ref 44.0–60.0)
pH, Ven: 7.386 (ref 7.250–7.430)
pO2, Ven: 446 mmHg — ABNORMAL HIGH (ref 32.0–45.0)

## 2017-03-19 LAB — RAPID URINE DRUG SCREEN, HOSP PERFORMED
AMPHETAMINES: NOT DETECTED
BARBITURATES: NOT DETECTED
BENZODIAZEPINES: POSITIVE — AB
COCAINE: POSITIVE — AB
OPIATES: POSITIVE — AB
TETRAHYDROCANNABINOL: POSITIVE — AB

## 2017-03-19 LAB — COMPREHENSIVE METABOLIC PANEL
ALT: 25 U/L (ref 17–63)
ALT: 25 U/L (ref 17–63)
ANION GAP: 7 (ref 5–15)
AST: 48 U/L — AB (ref 15–41)
AST: 46 U/L — AB (ref 15–41)
Albumin: 2.4 g/dL — ABNORMAL LOW (ref 3.5–5.0)
Albumin: 2.6 g/dL — ABNORMAL LOW (ref 3.5–5.0)
Alkaline Phosphatase: 23 U/L — ABNORMAL LOW (ref 38–126)
Alkaline Phosphatase: 25 U/L — ABNORMAL LOW (ref 38–126)
Anion gap: 6 (ref 5–15)
BILIRUBIN TOTAL: 0.7 mg/dL (ref 0.3–1.2)
BUN: 19 mg/dL (ref 6–20)
BUN: 16 mg/dL (ref 6–20)
CHLORIDE: 110 mmol/L (ref 101–111)
CHLORIDE: 110 mmol/L (ref 101–111)
CO2: 24 mmol/L (ref 22–32)
CO2: 21 mmol/L — ABNORMAL LOW (ref 22–32)
CREATININE: 1.48 mg/dL — AB (ref 0.61–1.24)
Calcium: 7.6 mg/dL — ABNORMAL LOW (ref 8.9–10.3)
Calcium: 6.9 mg/dL — ABNORMAL LOW (ref 8.9–10.3)
Creatinine, Ser: 1.43 mg/dL — ABNORMAL HIGH (ref 0.61–1.24)
GFR calc Af Amer: >60 mL/min (ref >60)
GFR, EST NON AFRICAN AMERICAN: 53 mL/min — AB (ref >60)
GFR, EST NON AFRICAN AMERICAN: 55 mL/min — AB (ref >60)
Glucose, Bld: 93 mg/dL (ref 65–99)
Glucose, Bld: 169 mg/dL — ABNORMAL HIGH (ref 65–99)
POTASSIUM: 3.6 mmol/L (ref 3.5–5.1)
POTASSIUM: 4 mmol/L (ref 3.5–5.1)
SODIUM: 140 mmol/L (ref 135–145)
Sodium: 138 mmol/L (ref 135–145)
TOTAL PROTEIN: 4.1 g/dL — AB (ref 6.5–8.1)
Total Bilirubin: 0.7 mg/dL (ref 0.3–1.2)
Total Protein: 3.8 g/dL — ABNORMAL LOW (ref 6.5–8.1)

## 2017-03-19 LAB — I-STAT CHEM 8, ED
BUN: 28 mg/dL — ABNORMAL HIGH (ref 6–20)
CALCIUM ION: 0.88 mmol/L — AB (ref 1.15–1.40)
CHLORIDE: 106 mmol/L (ref 101–111)
Creatinine, Ser: 1.4 mg/dL — ABNORMAL HIGH (ref 0.61–1.24)
GLUCOSE: 93 mg/dL (ref 65–99)
HCT: 41 % (ref 39.0–52.0)
HEMOGLOBIN: 13.9 g/dL (ref 13.0–17.0)
POTASSIUM: 5.4 mmol/L — AB (ref 3.5–5.1)
SODIUM: 138 mmol/L (ref 135–145)
TCO2: 26 mmol/L (ref 22–32)

## 2017-03-19 LAB — URINALYSIS, COMPLETE (UACMP) WITH MICROSCOPIC
BILIRUBIN URINE: NEGATIVE
Glucose, UA: NEGATIVE mg/dL
Hgb urine dipstick: NEGATIVE
Ketones, ur: NEGATIVE mg/dL
Leukocytes, UA: NEGATIVE
Nitrite: NEGATIVE
PH: 6 (ref 5.0–8.0)
Protein, ur: NEGATIVE mg/dL
SPECIFIC GRAVITY, URINE: 1.023 (ref 1.005–1.030)
SQUAMOUS EPITHELIAL / LPF: NONE SEEN

## 2017-03-19 LAB — PHOSPHORUS: PHOSPHORUS: 2.9 mg/dL (ref 2.5–4.6)

## 2017-03-19 LAB — CBC
HEMATOCRIT: 40.7 % (ref 39.0–52.0)
Hemoglobin: 13.3 g/dL (ref 13.0–17.0)
MCH: 31.7 pg (ref 26.0–34.0)
MCHC: 32.7 g/dL (ref 30.0–36.0)
MCV: 96.9 fL (ref 78.0–100.0)
PLATELETS: 147 10*3/uL — AB (ref 150–400)
RBC: 4.2 MIL/uL — ABNORMAL LOW (ref 4.22–5.81)
RDW: 15.3 % (ref 11.5–15.5)
WBC: 5.4 10*3/uL (ref 4.0–10.5)

## 2017-03-19 LAB — TYPE AND SCREEN
ABO/RH(D): A POS
ANTIBODY SCREEN: NEGATIVE

## 2017-03-19 LAB — COOXEMETRY PANEL
CARBOXYHEMOGLOBIN: 2.1 % — AB (ref 0.5–1.5)
METHEMOGLOBIN: 1.3 % (ref 0.0–1.5)
O2 Saturation: 58.8 %
TOTAL HEMOGLOBIN: 13.7 g/dL (ref 12.0–16.0)

## 2017-03-19 LAB — CORTISOL: CORTISOL PLASMA: 14.9 ug/dL

## 2017-03-19 LAB — APTT: APTT: 31 s (ref 24–36)

## 2017-03-19 LAB — GLUCOSE, CAPILLARY
Glucose-Capillary: 196 mg/dL — ABNORMAL HIGH (ref 65–99)
Glucose-Capillary: 182 mg/dL — ABNORMAL HIGH (ref 65–99)

## 2017-03-19 LAB — PROTIME-INR
INR: 1.1
Prothrombin Time: 14.3 seconds (ref 11.4–15.2)

## 2017-03-19 LAB — AMMONIA: AMMONIA: 51 umol/L — AB (ref 9–35)

## 2017-03-19 LAB — LACTIC ACID, PLASMA: LACTIC ACID, VENOUS: 1.7 mmol/L (ref 0.5–1.9)

## 2017-03-19 LAB — PROCALCITONIN

## 2017-03-19 LAB — CBG MONITORING, ED: Glucose-Capillary: 97 mg/dL (ref 65–99)

## 2017-03-19 LAB — ABO/RH: ABO/RH(D): A POS

## 2017-03-19 LAB — MRSA PCR SCREENING: MRSA BY PCR: NEGATIVE

## 2017-03-19 LAB — TSH: TSH: 55.749 u[IU]/mL — AB (ref 0.350–4.500)

## 2017-03-19 LAB — I-STAT TROPONIN, ED: TROPONIN I, POC: 0 ng/mL (ref 0.00–0.08)

## 2017-03-19 LAB — BRAIN NATRIURETIC PEPTIDE: B Natriuretic Peptide: 6.8 pg/mL (ref 0.0–100.0)

## 2017-03-19 LAB — ETHANOL: Alcohol, Ethyl (B): <5 mg/dL (ref <5)

## 2017-03-19 LAB — AMYLASE: AMYLASE: 20 U/L — AB (ref 28–100)

## 2017-03-19 LAB — LIPASE, BLOOD: Lipase: 23 U/L (ref 11–51)

## 2017-03-19 MED ORDER — MIDAZOLAM HCL 2 MG/2ML IJ SOLN: 2.0000 mg | INTRAMUSCULAR | Status: DC | PRN

## 2017-03-19 MED ORDER — ETOMIDATE 2 MG/ML IV SOLN
INTRAVENOUS | Status: AC | PRN
  Administered 2017-03-19: 20 mg via INTRAVENOUS

## 2017-03-19 MED ORDER — NALOXONE HCL 2 MG/2ML IJ SOSY
PREFILLED_SYRINGE | INTRAMUSCULAR | Status: AC
  Filled 2017-03-19: qty 2

## 2017-03-19 MED ORDER — PANTOPRAZOLE SODIUM 40 MG PO PACK
40.0000 mg | PACK | Freq: Every day | ORAL | Status: DC
  Administered 2017-03-19 – 2017-03-20 (×2): 40 mg
  Filled 2017-03-19 (×3): qty 20

## 2017-03-19 MED ORDER — FENTANYL CITRATE (PF) 100 MCG/2ML IJ SOLN: 100.0000 ug | INTRAMUSCULAR | Status: DC | PRN

## 2017-03-19 MED ORDER — HEPARIN SODIUM (PORCINE) 5000 UNIT/ML IJ SOLN
5000.0000 [IU] | Freq: Three times a day (TID) | INTRAMUSCULAR | Status: DC
  Administered 2017-03-19 – 2017-03-27 (×21): 5000 [IU] via SUBCUTANEOUS
  Filled 2017-03-19 (×23): qty 1

## 2017-03-19 MED ORDER — SODIUM CHLORIDE 0.9 % IV BOLUS (SEPSIS)
1000.0000 mL | Freq: Once | INTRAVENOUS | Status: AC
  Administered 2017-03-19: 1000 mL via INTRAVENOUS

## 2017-03-19 MED ORDER — FENTANYL 2500MCG IN NS 250ML (10MCG/ML) PREMIX INFUSION
10.0000 ug/h | INTRAVENOUS | Status: DC
  Administered 2017-03-19: 25 ug/h via INTRAVENOUS
  Filled 2017-03-19 (×2): qty 250

## 2017-03-19 MED ORDER — ALBUTEROL (5 MG/ML) CONTINUOUS INHALATION SOLN
2.5000 mg/h | INHALATION_SOLUTION | RESPIRATORY_TRACT | Status: DC
  Administered 2017-03-19: 2.5 mg/h via RESPIRATORY_TRACT
  Filled 2017-03-19: qty 20

## 2017-03-19 MED ORDER — INSULIN ASPART 100 UNIT/ML ~~LOC~~ SOLN: 0.0000 [IU] | SUBCUTANEOUS | Status: DC

## 2017-03-19 MED ORDER — DEXTROSE 5 % IV SOLN
0.0000 ug/min | INTRAVENOUS | Status: DC
  Administered 2017-03-19: 10 ug/min via INTRAVENOUS
  Administered 2017-03-20: 12 ug/min via INTRAVENOUS
  Filled 2017-03-19 (×3): qty 4

## 2017-03-19 MED ORDER — FENTANYL CITRATE (PF) 100 MCG/2ML IJ SOLN
INTRAMUSCULAR | Status: AC
  Administered 2017-03-19: 100 ug
  Filled 2017-03-19: qty 2

## 2017-03-19 MED ORDER — MIDAZOLAM HCL 2 MG/2ML IJ SOLN
INTRAMUSCULAR | Status: AC
  Filled 2017-03-19: qty 2

## 2017-03-19 MED ORDER — ROCURONIUM BROMIDE 50 MG/5ML IV SOLN
INTRAVENOUS | Status: AC | PRN
  Administered 2017-03-19: 80 mg via INTRAVENOUS

## 2017-03-19 MED ORDER — ASPIRIN 300 MG RE SUPP: 300.0000 mg | RECTAL | Status: AC

## 2017-03-19 MED ORDER — NALOXONE HCL 2 MG/2ML IJ SOSY
2.0000 mg | PREFILLED_SYRINGE | Freq: Once | INTRAMUSCULAR | Status: AC
  Administered 2017-03-19: 2 mg via INTRAVENOUS

## 2017-03-19 MED ORDER — DEXTROSE-NACL 5-0.45 % IV SOLN
INTRAVENOUS | Status: DC
  Administered 2017-03-19: 17:00:00 via INTRAVENOUS

## 2017-03-19 MED ORDER — IOPAMIDOL (ISOVUE-370) INJECTION 76%
INTRAVENOUS | Status: AC
  Administered 2017-03-19: 50 mL
  Filled 2017-03-19: qty 50

## 2017-03-19 MED ORDER — SODIUM CHLORIDE 0.9 % IV SOLN: 250.0000 mL | INTRAVENOUS | Status: DC | PRN

## 2017-03-19 MED ORDER — SODIUM CHLORIDE 0.9 % IV SOLN
INTRAVENOUS | Status: DC
  Administered 2017-03-19: 22:00:00 via INTRAVENOUS
  Administered 2017-03-20: 75 mL/h via INTRAVENOUS

## 2017-03-19 MED ORDER — ACETAMINOPHEN 325 MG PO TABS: 650.0000 mg | ORAL_TABLET | ORAL | Status: DC | PRN

## 2017-03-19 MED ORDER — ASPIRIN 81 MG PO CHEW: 324.0000 mg | CHEWABLE_TABLET | ORAL | Status: AC

## 2017-03-19 MED ORDER — MIDAZOLAM HCL 2 MG/2ML IJ SOLN: 1.0000 mg | INTRAMUSCULAR | Status: DC | PRN

## 2017-03-19 MED ORDER — DEXTROSE 5 % IV SOLN
0.0000 ug/min | INTRAVENOUS | Status: DC
  Administered 2017-03-19: 20 ug/min via INTRAVENOUS
  Filled 2017-03-19: qty 4

## 2017-03-19 MED ORDER — NOREPINEPHRINE BITARTRATE 1 MG/ML IV SOLN: 0.0000 ug/min | Freq: Once | INTRAVENOUS | Status: DC

## 2017-03-19 MED ORDER — INSULIN ASPART 100 UNIT/ML ~~LOC~~ SOLN
0.0000 [IU] | SUBCUTANEOUS | Status: DC
Start: 2017-03-20 — End: 2017-03-21
  Administered 2017-03-20 (×2): 1 [IU] via SUBCUTANEOUS
  Administered 2017-03-20: 2 [IU] via SUBCUTANEOUS
  Administered 2017-03-20: 1 [IU] via SUBCUTANEOUS
  Administered 2017-03-21: 2 [IU] via SUBCUTANEOUS

## 2017-03-19 MED ORDER — INSULIN ASPART 100 UNIT/ML ~~LOC~~ SOLN: 0.0000 [IU] | Freq: Three times a day (TID) | SUBCUTANEOUS | Status: DC

## 2017-03-19 NOTE — ED Notes (Signed)
Zoll placed on patient.

## 2017-03-19 NOTE — ED Notes (Signed)
Doctor at bedside.

## 2017-03-19 NOTE — ED Notes (Signed)
In and our performed by Coralyn Pear EMT with nurse and nurse tech assisting. Patient tensed all extremities attempted to touch penis.

## 2017-03-19 NOTE — ED Notes (Signed)
Nurse transported patient to CT

## 2017-03-19 NOTE — Progress Notes (Signed)
[facility] Progress Note Patient Name: [name] DOB: July 07, 1966 MRN: [id number]   Date of Service  03/19/2017  HPI/Events of Note  Pulling at tubes, lines  eICU Interventions  restraints     Intervention Category Major Interventions: Change in mental status - evaluation and management  Simonne Maffucci 03/19/2017, 8:00 PM

## 2017-03-19 NOTE — ED Notes (Signed)
Friend arrived who is deaf and interpretor 2 minutes later.  Friend states went to donate plasma 0800 feeling normal. On the way home home feeling dizzy and told friend to call 911 at 1300.

## 2017-03-19 NOTE — H&P (Signed)
PULMONARY / CRITICAL CARE MEDICINE   Name: ELLSWORTH Whitehead MRN: [id number] DOB: [birth year]    ADMISSION DATE:  03/19/2017 CONSULTATION DATE:  03/19/2017  REFERRING MD:  Dr. Dayna Barker  CHIEF COMPLAINT:  Unresponsive  HISTORY OF PRESENT ILLNESS:  HPI obtained from medical chart review as patient is currently intubated and sedated.  51 year old male with PMH as below significant for but not limited to hypothyroidism, dysphagia, ? 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  Patient had reportedly donated plasma this morning around 0800 and felt dizzy on the way home.  Friend reported he injected heroin at some point and then found unresponsive by family/ girlfriend around 1300.  Upon arrival to ER, patient obtunded and hypotensive despite 3L NS IVF.  No response to narcan.  UDS positive for opiates, cocaine, benzodiazepines, and THC.  Labs noted for k 3.6, glucose 93, sCr 1.48, AST 48, ammonia 51, WBC 4.0, UA neg, EKG non-acute, CT head negative. Patient intubated for airway protection and CVL placed.  Patient with downward gaze.  Neurology consulted.  CTA head/ neck pending. PCCM to admit.    PAST MEDICAL HISTORY :  He  has a past medical history of Headache(784.0); Heart murmur; radiation therapy (05/19/13- 06/27/13); Hypothyroidism; Shortness of breath; Squamous cell carcinoma of vocal cord (HCC) (09/11/2013); and Vocal cord cancer ([facility]) (05/01/2013).  PAST SURGICAL HISTORY: He  has a past surgical history that includes Tonsillectomy; wisdon teeth; Direct laryngoscopy (Right, 04/24/2013); Microlaryngoscopy (Right, 04/24/2013); Multiple extractions with alveoloplasty (N/A, 09/30/2013); Esophagogastroduodenoscopy (egd) with propofol (N/A, 12/30/2013); Balloon dilation (N/A, 12/30/2013); and Esophageal manometry (N/A, 09/07/2014).  Allergies  Allergen Reactions  . Fentanyl Nausea And Vomiting   . Citalopram Other (See Comments)    Dizziness and shakiness.  . Morphine And Related Nausea And Vomiting    No current facility-administered medications on file prior to encounter.    Current Outpatient Prescriptions on File Prior to Encounter  Medication Sig  . albuterol (PROVENTIL HFA;VENTOLIN HFA) 108 (90 Base) MCG/ACT inhaler Inhale 1-2 puffs into the lungs every 6 (six) hours as needed for wheezing or shortness of breath.    FAMILY HISTORY:  His indicated that his mother is deceased. He indicated that his father is deceased.    SOCIAL HISTORY: He  reports that he has been smoking Cigarettes.  He has a 6.80 pack-year smoking history. He has never used smokeless tobacco. He reports that he does not drink alcohol or use drugs.  REVIEW OF SYSTEMS:  Unable to assess.  SUBJECTIVE:  Currently on levophed 10 mcg/min  VITAL SIGNS: BP (!) 81/57   Pulse 79   Temp 97.8 F (36.6 C) (Rectal)   Resp 15   Ht 6' (1.829 m)   SpO2 100%   HEMODYNAMICS:    VENTILATOR SETTINGS: Vent Mode: PRVC FiO2 (%):  [100 %] 100 % Set Rate:  [18 bmp] 18 bmp Vt Set:  [678 mL] 620 mL PEEP:  [5 cmH20] 5 cmH20 Plateau Pressure:  [13 cmH20] 13 cmH20  INTAKE / OUTPUT: No intake/output data recorded.  PHYSICAL EXAMINATION: General:  Adult male lying in ER stretcher in NAD HEENT: MM pink/moist, ETT, pupils 5/=/reactive, no JVD Neuro: unresponsive, no gag or cough CV: rrr, no murmur PULM: even/non-labored, lungs bilaterally coarse on MV GI: soft, bsx4 active  Extremities: warm/dry, no edema  Skin: no rashes or lesions, tattoos  LABS:  BMET  Recent Labs Lab 03/19/17 1332 03/19/17 1343  NA 138 140  K 5.4* 3.6  CL 106 110  CO2  --  24  BUN 28* 19  CREATININE 1.40* 1.48*  GLUCOSE 93 93    Electrolytes  Recent Labs Lab 03/19/17 1343  CALCIUM 7.6*    CBC  Recent Labs Lab 03/19/17 1332 03/19/17 1343  WBC  --  4.0  HGB 13.9 12.7*  HCT 41.0 38.4*  PLT  --  144*     Coag's No results for input(s): APTT, INR in the last 168 hours.  Sepsis Markers  Recent Labs Lab 03/19/17 1343  LATICACIDVEN 1.7    ABG No results for input(s): PHART, PCO2ART, PO2ART in the last 168 hours.  Liver Enzymes  Recent Labs Lab 03/19/17 1343  AST 48*  ALT 25  ALKPHOS 23*  BILITOT 0.7  ALBUMIN 2.4*    Cardiac Enzymes No results for input(s): TROPONINI, PROBNP in the last 168 hours.  Glucose  Recent Labs Lab 03/19/17 1339  GLUCAP 97    Imaging Ct Head Wo Contrast  Result Date: 03/19/2017 CLINICAL DATA:  Altered consciousness. History of vocal cord cancer. Radiation therapy. EXAM: CT HEAD WITHOUT CONTRAST TECHNIQUE: Contiguous axial images were obtained from the base of the skull through the vertex without intravenous contrast. COMPARISON:  03/20/2013 FINDINGS: Brain: No mass lesion, hemorrhage, hydrocephalus, acute infarct, intra-axial, or extra-axial fluid collection. Vascular: No hyperdense vessel or unexpected calcification. Skull: Similar mild left supraorbital scalp soft tissue fullness. Sinuses/Orbits: Normal imaged portions of the orbits and globes. Mucous retention cysts or polyps in the right maxillary sinus. Minimal ethmoid air cell mucosal thickening. Hypoplastic right frontal sinus. Clear mastoid air cells. Other: None. IMPRESSION: 1.  No acute intracranial abnormality. 2. Mild sinus disease. Electronically Signed   By: Abigail Miyamoto M.D.   On: 03/19/2017 13:58   Dg Chest Portable 1 View  Result Date: 03/19/2017 CLINICAL DATA:  Altered level of consciousness EXAM: PORTABLE CHEST 1 VIEW COMPARISON:  PA and lateral chest x-ray of May 16, 2016 FINDINGS: Lungs are adequately inflated and clear. The heart and pulmonary vascularity are normal. The mediastinum is normal in width. There is no pleural effusion. There is calcification in the wall of the aortic arch. IMPRESSION: There is no acute cardiopulmonary abnormality. Electronically Signed   By:  David  Martinique M.D.   On: 03/19/2017 14:11   STUDIES:  8/27 CT head >> neg for acute, mild sinus disease 8/27 CTA head/ neck >>   CULTURES: 8/27 BCx 2 >> 8/27 UC >>  ANTIBIOTICS: none  SIGNIFICANT EVENTS: 8/27 Admit  LINES/TUBES: 8/27 ETT >> 8/27 R TL CVC >>  DISCUSSION: 51 year old male with history of vocal cord cancer that supposedly he is getting treatment for but took himself off chemo 3 wks ago who used benzos, THC, narcotic and cocaine and was found unresponsive.  On exam, lungs are clear but patient is still paralyzed so difficult to ascertain full exam.  I reviewed CXR myself, no acute disease noted and ETT is in good position on the head and neck CT.  ASSESSMENT / PLAN:  PULMONARY A: VDRF due to inability to protect his airway. P:   - Full vent support - Adjust vent for ABG - Wean as patient's drugs wear off.  CARDIOVASCULAR A:  Circulatory shock due to polypharmacy most likely, no signs of infection here P:  - Hydrate - Levophed - CVP - If not improving then will consider  echo and other alternatives.  RENAL A:   Hyperkalemia that resolved and mild AKI P:   - Hydrate - BMET in AM - Replace electrolytes as indicated  GASTROINTESTINAL A:   No active issues P:   - PPI - TF in AM if not extubatable in AM  HEMATOLOGIC A:   No active issues P:  - CBC in AM - Transfuse per ICU protocol  INFECTIOUS A:   No active issues P:   - Check PCT - Monitor WBC and fever curve  ENDOCRINE A:   No active issues   P:   - Check cortisol - Stress dose steroids for now  NEUROLOGIC A:   Unresponsive due to drug OD ?CVA P:   RASS goal: 0 - Per neuro - Fentanyl drip if needed - PRN versed if needed - EEG - CTA noted.  FAMILY  - Updates: Patient is sedated, intubated and pressed.  Need to clarify code status given history above.  CSW will obtain contact information for family.  - Inter-disciplinary family meet or Palliative Care meeting due by:   day 7  The patient is critically ill with multiple organ systems failure and requires high complexity decision making for assessment and support, frequent evaluation and titration of therapies, application of advanced monitoring technologies and extensive interpretation of multiple databases.   Critical Care Time devoted to patient care services described in this note is  35  Minutes. This time reflects time of care of this signee Dr Jennet Maduro. This critical care time does not reflect procedure time, or teaching time or supervisory time of PA/NP/Med student/Med Resident etc but could involve care discussion time.  Rush Farmer, M.D. [facility]. Pager: [phone]. After hours pager: [phone].  03/19/2017, 5:21 PM

## 2017-03-19 NOTE — Procedures (Signed)
History: 51 year old male with altered mental status  Sedation: Recent intubation  Technique: This is a 21 channel routine scalp EEG performed at the bedside with bipolar and monopolar montages arranged in accordance to the international 10/20 system of electrode placement. One channel was dedicated to EKG recording.    Background: The background consists of generalized, irregular at times quasi-rhythmic theta activity as well as frontally predominant beta activity. There is no reactivity to noxious stimulation or eye opening/closure. There is no epileptiform discharge noted.  Photic stimulation: Physiologic driving is not performed  EEG Abnormalities: 1) generalized irregular slow activity 2) lack of reactivity  Clinical Interpretation: This EEG is consistent with a  generalized non-specific cerebral dysfunction (encephalopathy). This could be due to medication effect, toxic metabolic encephalopathy, anoxic brain injury, among other causes.  There was no seizure or seizure predisposition recorded on this study. Please note that a normal EEG does not preclude the possibility of epilepsy.   Roland Rack, MD [facility] [phone]  If 7pm- 7am, please page neurology on call as listed in [place].

## 2017-03-19 NOTE — ED Notes (Signed)
Took manual blood pressure in left arm it was 70/59 took right arm it was 80/59 notified RN Marya Amsler of both pressures patient is resting

## 2017-03-19 NOTE — Consult Note (Signed)
NEURO HOSPITALIST CONSULT NOTE   Requestig physician: Dr. Dayna Barker   Reason for Consult: AMS in setting of possible OD   History obtained from:  Chart   HPI:                                                                                                                                          [name] is an 51 y.o. male who apparently this morning donated plasma and on the drive home was complaining of chest pain. At this point patient is obtunded and unable to give history however history was obtained from family members were in the room. It is unclear when patient had taken paraphernalia however his urine drug screen did show positive for opiates, cocaine, benzodiazepines, THC. Currently patient is stting at 100 but is hypotensive with blood pressures 62/47 after 3 L of fluid. Patient has received multiple doses of Narcan with no relief. At this time is unclear the etiology of his of tenderness. There is concern obviously for overdose versus possible seizure versus possible posterior circulation infarct  Past Medical History:  Diagnosis Date  . Headache(784.0)   . Heart murmur    child  . Hx of radiation therapy 05/19/13- 06/27/13   glottic larynx, right true vocal cord 6300 cGy 28 sessions  . Hypothyroidism   . Shortness of breath   . 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 BALLOON DILATION N/A 12/30/2013   Procedure: BALLOON DILATION;  Surgeon: Leslie Fair, MD;  Location: [facility];  Service: Endoscopy;  Laterality: N/A;  . DIRECT LARYNGOSCOPY Right 04/24/2013   Procedure: DIRECT LARYNGOSCOPY and BIOPSY OF TONGUE;  Surgeon: Leslie Belfast, MD;  Location: [facility];  Service: ENT;  Laterality: Right;  . ESOPHAGEAL MANOMETRY N/A 09/07/2014   Procedure: ESOPHAGEAL MANOMETRY (EM);  Surgeon: Leslie Fair, MD;  Location: [facility];  Service:  Endoscopy;  Laterality: N/A;  . ESOPHAGOGASTRODUODENOSCOPY (EGD) WITH PROPOFOL N/A 12/30/2013   Procedure: ESOPHAGOGASTRODUODENOSCOPY (EGD) WITH PROPOFOL;  Surgeon: Leslie Fair, MD;  Location: [facility];  Service: Endoscopy;  Laterality: N/A;  . MICROLARYNGOSCOPY Right 04/24/2013   Procedure: MICROLARYNGOSCOPY and RIGHT VOCAL CORD BIOPSY;  Surgeon: Leslie Belfast, MD;  Location: [facility];  Service: ENT;  Laterality: Right;  . MULTIPLE EXTRACTIONS WITH ALVEOLOPLASTY N/A 09/30/2013   Procedure: Extraction of tooth #'s 2,3,4,5,6,7,8,9,10,11,12,15,22,23,24,25,26,27,28 with alveoloplasty;  Surgeon: [name], DDS;  Location: [facility];  Service: Oral Surgery;  Laterality: N/A;  . TONSILLECTOMY     as a child  . wisdon teeth     age 24    Family History  Problem Relation Age of Onset  . COPD Mother   .  Emphysema Mother   . Heart attack Father      Social History:  reports that he has been smoking Cigarettes.  He has a 6.80 pack-year smoking history. He has never used smokeless tobacco. He reports that he does not drink alcohol or use drugs.  Allergies  Allergen Reactions  . Fentanyl Nausea And Vomiting  . Citalopram Other (See Comments)    Dizziness and shakiness.  . Morphine And Related Nausea And Vomiting    MEDICATIONS:                                                                                                                     Current Facility-Administered Medications  Medication Dose Route Frequency Provider Last Rate Last Dose  . naloxone (NARCAN) 2 MG/2ML injection           . norepinephrine (LEVOPHED) 4 mg in dextrose 5 % 250 mL (0.016 mg/mL) infusion  0-40 mcg/min Intravenous Once [name], Leslie Cornea, MD      . norepinephrine (LEVOPHED) 4 mg in dextrose 5 % 250 mL (0.016 mg/mL) infusion  0-100 mcg/min Intravenous Titrated [name], Leslie Cornea, MD       Current Outpatient Prescriptions  Medication Sig Dispense Refill  . albuterol (PROVENTIL HFA;VENTOLIN HFA) 108 (90 Base)  MCG/ACT inhaler Inhale 1-2 puffs into the lungs every 6 (six) hours as needed for wheezing or shortness of breath. 18 g 3  . levothyroxine (SYNTHROID, LEVOTHROID) 175 MCG tablet Take 1 tablet (175 mcg total) by mouth daily before breakfast. Void previous prescription on file for levothyroxine (Patient not taking: Reported on 03/19/2017) 90 tablet 3  . omeprazole (PRILOSEC) 20 MG capsule Take 1 capsule (20 mg total) by mouth daily. Application faxed today, for questions pls call [phone] (Patient not taking: Reported on 03/19/2017) 90 capsule 3  . Oxycodone HCl 10 MG TABS Take 1 tablet (10 mg total) by mouth daily as needed. (Patient not taking: Reported on 03/19/2017) 10 tablet 0      ROS:                                                                                                                                       History obtained from unobtainable from patient due to altered mental status  General ROS: negative for - chills, fatigue, fever, night sweats, weight gain or weight loss Psychological ROS: negative for - behavioral disorder, hallucinations, memory difficulties, mood swings or  suicidal ideation Ophthalmic ROS: negative for - blurry vision, double vision, eye pain or loss of vision ENT ROS: negative for - epistaxis, nasal discharge, oral lesions, sore throat, tinnitus or vertigo Allergy and Immunology ROS: negative for - hives or itchy/watery eyes Hematological and Lymphatic ROS: negative for - bleeding problems, bruising or swollen lymph nodes Endocrine ROS: negative for - galactorrhea, hair pattern changes, polydipsia/polyuria or temperature intolerance Respiratory ROS: negative for - cough, hemoptysis, shortness of breath or wheezing Cardiovascular ROS: negative for - chest pain, dyspnea on exertion, edema or irregular heartbeat Gastrointestinal ROS: negative for - abdominal pain, diarrhea, hematemesis, nausea/vomiting or stool incontinence Genito-Urinary ROS: negative for -  dysuria, hematuria, incontinence or urinary frequency/urgency Musculoskeletal ROS: negative for - joint swelling or muscular weakness Neurological ROS: as noted in HPI Dermatological ROS: negative for rash and skin lesion changes   Blood pressure (!) 68/44, pulse 73, temperature 97.8 F (36.6 C), temperature source Rectal, resp. rate 11, SpO2 99 %.   Neurologic Examination:                                                                                                      HEENT-  Normocephalic, no lesions, without obvious abnormality.  Normal external eye and conjunctiva.  Normal TM's bilaterally.  Normal auditory canals and external ears. Normal external nose, mucus membranes and septum.  Normal pharynx. Cardiovascular- S1, S2 normal, pulses palpable throughout   Lungs- chest clear, no wheezing, rales, normal symmetric air entry Abdomen- normal findings: bowel sounds normal Extremities- no edema Lymph-no adenopathy palpable Musculoskeletal-no joint tenderness, deformity or swelling Skin-warm and dry, no hyperpigmentation, vitiligo, or suspicious lesions  Neurological Examination Mental Status: Patient is obtunded, snoring, he is deaf so he is not following any verbal commands. Patient does localize to pain with sternal rub especially with his right hand. Cranial Nerves: II: unable to get a good visual exam as patient's eyes are deviated did downward III,IV, VI: eyes are closed, when opened he does not have a doll's eyes pupils are reactive. V,VII: face symmetric,winces to noxious stimuli VIII: deaf IX,X: unable to visualize  Motor: Moving all extremities antigravity and as noted above he localizes to noxious stimuli in his sternum with his right hand. Sensory: reacts to noxious stimuli Deep Tendon Reflexes: 2+ and symmetric throughout Plantars: Right: downgoing   Left: downgoing Cerebellar: Unable to obtain due to mental status Gait:not tested      Lab Results: Basic  Metabolic Panel:  Recent Labs Lab 03/19/17 1332 03/19/17 1343  NA 138 140  K 5.4* 3.6  CL 106 110  CO2  --  24  GLUCOSE 93 93  BUN 28* 19  CREATININE 1.40* 1.48*  CALCIUM  --  7.6*    Liver Function Tests:  Recent Labs Lab 03/19/17 1343  AST 48*  ALT 25  ALKPHOS 23*  BILITOT 0.7  PROT 3.8*  ALBUMIN 2.4*   No results for input(s): LIPASE, AMYLASE in the last 168 hours.  Recent Labs Lab 03/19/17 1343  AMMONIA 51*    CBC:  Recent Labs Lab 03/19/17  1332 03/19/17 1343  WBC  --  4.0  NEUTROABS  --  3.1  HGB 13.9 12.7*  HCT 41.0 38.4*  MCV  --  96.2  PLT  --  144*    Cardiac Enzymes: No results for input(s): CKTOTAL, CKMB, CKMBINDEX, TROPONINI in the last 168 hours.  Lipid Panel: No results for input(s): CHOL, TRIG, HDL, CHOLHDL, VLDL, LDLCALC in the last 168 hours.  CBG:  Recent Labs Lab 03/19/17 2353  IRWERX 54    Microbiology: No results found for this or any previous visit.  Coagulation Studies: No results for input(s): LABPROT, INR in the last 72 hours.  Imaging: Ct Head Wo Contrast  Result Date: 03/19/2017 CLINICAL DATA:  Altered consciousness. History of vocal cord cancer. Radiation therapy. EXAM: CT HEAD WITHOUT CONTRAST TECHNIQUE: Contiguous axial images were obtained from the base of the skull through the vertex without intravenous contrast. COMPARISON:  03/20/2013 FINDINGS: Brain: No mass lesion, hemorrhage, hydrocephalus, acute infarct, intra-axial, or extra-axial fluid collection. Vascular: No hyperdense vessel or unexpected calcification. Skull: Similar mild left supraorbital scalp soft tissue fullness. Sinuses/Orbits: Normal imaged portions of the orbits and globes. Mucous retention cysts or polyps in the right maxillary sinus. Minimal ethmoid air cell mucosal thickening. Hypoplastic right frontal sinus. Clear mastoid air cells. Other: None. IMPRESSION: 1.  No acute intracranial abnormality. 2. Mild sinus disease. Electronically Signed    By: [name] M.D.   On: 03/19/2017 13:58   Dg Chest Portable 1 View  Result Date: 03/19/2017 CLINICAL DATA:  Altered level of consciousness EXAM: PORTABLE CHEST 1 VIEW COMPARISON:  PA and lateral chest x-ray of May 16, 2016 FINDINGS: Lungs are adequately inflated and clear. The heart and pulmonary vascularity are normal. The mediastinum is normal in width. There is no pleural effusion. There is calcification in the wall of the aortic arch. IMPRESSION: There is no acute cardiopulmonary abnormality. Electronically Signed   By: [name] M.D.   On: 03/19/2017 14:11       Assessment and plan per attending neurologist  Etta Quill PA-C [facility] [phone]  03/19/2017, 3:42 PM   Assessment/Plan:  51 year old male presenting to the emergency room in a obtunded state with multiple positives on his urine drug screen. Given his lack of response to Narcan, I do think that ruling out possible intervenable causes would be prudent. To this end I would favor getting a CTA to rule out basilar thrombosis as well as stat EEG to rule out ongoing status.  My suspicion is that this is primarily substance-induced, but he is being evaluated further caused by ED as well.   Recommend: 1-CTA of head and neck with venous phase of head 2- STAT EEG 3) will follow.   Roland Rack, MD [facility] [phone]  If 7pm- 7am, please page neurology on call as listed in [facility].

## 2017-03-19 NOTE — ED Notes (Signed)
Daughter and daughter's boyfriend arrived and spoke with Doctor. Doctor will consult neurology.

## 2017-03-19 NOTE — ED Notes (Signed)
Returned from CT.

## 2017-03-19 NOTE — ED Notes (Signed)
Patient response to Doctor looking in bilateral ears moving bilateral arms and head side to side.

## 2017-03-19 NOTE — ED Provider Notes (Signed)
[facility] DEPT Provider Note   CSN: [id number] Arrival date & time: 03/19/17  1257     History   Chief Complaint Chief Complaint  Patient presents with  . Loss of Consciousness    HPI [name] is a 51 y.o. male.  Patient gave plasma and came home and told his girlfriend many years and he felt dizzy, nauseated and not well and sat down and for her to call EMS and subsequently became unresponsive. On initial exam patient is not responsive and not participating with history. EMS stated had hypotension and normal blood sugar. He apparently had a recent history of some type of throat cancer. No interventions prior to arrival. She also stated that he stopped treatment as he is tired of the throat cancer and just wanted to die. She stated made multiple statements regarding that same thought of wanting to die. No suicidal thoughts but not any desire to live. Initially attempted to contact daughter who was not able to be contacted.    Loss of Consciousness   This is a new problem. The current episode started less than 1 hour ago.   LEVEL V CAVEAT 2/2 UNRESPONSIVENESS  Past Medical History:  Diagnosis Date  . Headache(784.0)   . Heart murmur    child  . Hx of radiation therapy 05/19/13- 06/27/13   glottic larynx, right true vocal cord 6300 cGy 28 sessions  . Hypothyroidism   . Shortness of breath   . 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 Acute respiratory failure with hypoxemia ([place]) 03/19/2017  . Homelessness 10/25/2016  . Long term (current) use of opiate analgesic 10/11/2016  . Emphysema of lung ([place]) 12/18/2014  . Esophageal dysphagia 09/10/2014  . Tobacco use disorder 09/10/2014  . Routine health maintenance 07/09/2014  . Hypothyroidism due to acquired atrophy of thyroid 06/12/2014  . GERD (gastroesophageal reflux disease) 06/12/2014  . Hx of radiation  therapy   . 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 05/06/2013  . Hoarseness of voice 03/17/2013    Past Surgical History:  Procedure Laterality Date  . BALLOON DILATION N/A 12/30/2013   Procedure: BALLOON DILATION;  Surgeon: Garlan Fair, MD;  Location: [facility];  Service: Endoscopy;  Laterality: N/A;  . DIRECT LARYNGOSCOPY Right 04/24/2013   Procedure: DIRECT LARYNGOSCOPY and BIOPSY OF TONGUE;  Surgeon: Jerrell Belfast, MD;  Location: [facility];  Service: ENT;  Laterality: Right;  . ESOPHAGEAL MANOMETRY N/A 09/07/2014   Procedure: ESOPHAGEAL MANOMETRY (EM);  Surgeon: Garlan Fair, MD;  Location: [facility];  Service: Endoscopy;  Laterality: N/A;  . ESOPHAGOGASTRODUODENOSCOPY (EGD) WITH PROPOFOL N/A 12/30/2013   Procedure: ESOPHAGOGASTRODUODENOSCOPY (EGD) WITH PROPOFOL;  Surgeon: Garlan Fair, MD;  Location: [facility];  Service: Endoscopy;  Laterality: N/A;  . MICROLARYNGOSCOPY Right 04/24/2013   Procedure: MICROLARYNGOSCOPY and RIGHT VOCAL CORD BIOPSY;  Surgeon: Jerrell Belfast, MD;  Location: [facility];  Service: ENT;  Laterality: Right;  . MULTIPLE EXTRACTIONS WITH ALVEOLOPLASTY N/A 09/30/2013   Procedure: Extraction of tooth #'s 2,3,4,5,6,7,8,9,10,11,12,15,22,23,24,25,26,27,28 with alveoloplasty;  Surgeon: Lenn Cal, DDS;  Location: [facility];  Service: Oral Surgery;  Laterality: N/A;  . TONSILLECTOMY     as a child  . wisdon teeth     age 10       Home Medications    Prior to Admission medications   Medication Sig  Start Date End Date Taking? Authorizing Provider  albuterol (PROVENTIL HFA;VENTOLIN HFA) 108 (90 Base) MCG/ACT inhaler Inhale 1-2 puffs into the lungs every 6 (six) hours as needed for wheezing or shortness of breath. 05/15/16   Lucious [name], DO    Family History Family History  Problem Relation Age of Onset  . COPD Mother   . Emphysema Mother   . Heart attack Father     Social History Social History  Substance  Use Topics  . Smoking status: Current Every Day Smoker    Packs/day: 0.20    Years: 34.00    Types: Cigarettes  . Smokeless tobacco: Never Used     Comment: DOWN TO 4 - 5 CIGARETTES A DAY  . Alcohol use No     Allergies   Fentanyl; Citalopram; and Morphine and related   Review of Systems Review of Systems  Unable to perform ROS: Patient unresponsive  Cardiovascular: Positive for syncope.     Physical Exam Updated Vital Signs BP (!) 141/99   Pulse (!) 50   Temp 97.8 F (36.6 C) (Rectal)   Resp 18   Ht 6' (1.829 m)   SpO2 100%   Physical Exam  Constitutional: He appears well-developed and well-nourished.  HENT:  Head: Normocephalic and atraumatic.  Eyes: Conjunctivae are normal.  Neck: Neck supple.  Cardiovascular: Normal rate.  Exam reveals no decreased pulses.   Pulmonary/Chest: No respiratory distress.  Abdominal: Soft.  Musculoskeletal: He exhibits no edema or deformity.  Neurological: GCS eye subscore is 1. GCS verbal subscore is 1. GCS motor subscore is 4.  Skin: Skin is warm and dry. Capillary refill takes 2 to 3 seconds.  Psychiatric:  Can't evaluate  Nursing note and vitals reviewed.    ED Treatments / Results  Labs (all labs ordered are listed, but only abnormal results are displayed) Labs Reviewed  AMMONIA - Abnormal; Notable for the following:       Result Value   Ammonia 51 (*)    All other components within normal limits  COMPREHENSIVE METABOLIC PANEL - Abnormal; Notable for the following:    Creatinine, Ser 1.48 (*)    Calcium 7.6 (*)    Total Protein 3.8 (*)    Albumin 2.4 (*)    AST 48 (*)    Alkaline Phosphatase 23 (*)    GFR calc non Af Amer 53 (*)    All other components within normal limits  RAPID URINE DRUG SCREEN, HOSP PERFORMED - Abnormal; Notable for the following:    Opiates POSITIVE (*)    Cocaine POSITIVE (*)    Benzodiazepines POSITIVE (*)    Tetrahydrocannabinol POSITIVE (*)    All other components within normal  limits  CBC WITH DIFFERENTIAL/PLATELET - Abnormal; Notable for the following:    RBC 3.99 (*)    Hemoglobin 12.7 (*)    HCT 38.4 (*)    Platelets 144 (*)    All other components within normal limits  URINALYSIS, COMPLETE (UACMP) WITH MICROSCOPIC - Abnormal; Notable for the following:    Bacteria, UA RARE (*)    All other components within normal limits  I-STAT CHEM 8, ED - Abnormal; Notable for the following:    Potassium 5.4 (*)    BUN 28 (*)    Creatinine, Ser 1.40 (*)    Calcium, Ion 0.88 (*)    All other components within normal limits  I-STAT VENOUS BLOOD GAS, ED - Abnormal; Notable for the following:    pCO2, Ven 40.4 (*)  pO2, Ven 122.0 (*)    All other components within normal limits  I-STAT VENOUS BLOOD GAS, ED - Abnormal; Notable for the following:    pCO2, Ven 40.5 (*)    pO2, Ven 446.0 (*)    All other components within normal limits  CULTURE, BLOOD (ROUTINE X 2)  CULTURE, BLOOD (ROUTINE X 2)  URINE CULTURE  LACTIC ACID, PLASMA  ETHANOL  URINALYSIS, ROUTINE W REFLEX MICROSCOPIC  MISC [facility] TEST (SEND OUT)  BLOOD GAS, ARTERIAL  COOXEMETRY PANEL  TSH  CBC  COMPREHENSIVE METABOLIC PANEL  MAGNESIUM  PHOSPHORUS  AMYLASE  LIPASE, BLOOD  BRAIN NATRIURETIC PEPTIDE  CORTISOL  PROTIME-INR  APTT  BLOOD GAS, ARTERIAL  CBC  BASIC METABOLIC PANEL  BLOOD GAS, ARTERIAL  MAGNESIUM  PHOSPHORUS  PROCALCITONIN  PROCALCITONIN  I-STAT TROPONIN, ED  CBG MONITORING, ED  TYPE AND SCREEN  ABO/RH    EKG  EKG Interpretation  Date/Time:  Monday March 19 2017 13:06:57 EDT Ventricular Rate:  89 PR Interval:    QRS Duration: 85 QT Interval:  335 QTC Calculation: 408 R Axis:   86 Text Interpretation:  Sinus rhythm Ventricular premature complex Aberrant conduction of SV complex(es) Borderline T abnormalities, diffuse leads poor baseline, poor quality Confirmed by Merrily Pew [phone]) on 03/19/2017 1:18:20 PM Also confirmed by Merrily Pew [phone]), editor  Drema Pry [phone])  on 03/19/2017 1:52:42 PM       Radiology Ct Angio Head W Or Wo Contrast  Result Date: 03/19/2017 CLINICAL DATA:  51 y/o M; altered mental status. 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 EXAM: CT ANGIOGRAPHY HEAD AND NECK TECHNIQUE: Multidetector CT imaging of the head and neck was performed using the standard protocol during bolus administration of intravenous contrast. Multiplanar CT image reconstructions and MIPs were obtained to evaluate the vascular anatomy. Carotid stenosis measurements (when applicable) are obtained utilizing NASCET criteria, using the distal internal carotid diameter as the denominator. CONTRAST:  50 cc Isovue 370 COMPARISON:  03/19/2017 CT head FINDINGS: CTA NECK FINDINGS Aortic arch: Standard branching. Imaged portion shows no evidence of aneurysm or dissection. No significant stenosis of the major arch vessel origins. Right carotid system: No evidence of dissection, stenosis (50% or greater) or occlusion. Mild non stenotic calcified plaque of the right carotid bifurcation. Left carotid system: No evidence of dissection, stenosis (50% or greater) or occlusion. Vertebral arteries: Codominant. No evidence of dissection, stenosis (50% or greater) or occlusion. Skeleton: Negative. Other neck: Debris within oral and nasopharynx is likely due to intubation. Right central venous catheter tip below the field of view in the SVC. Upper chest: Paraseptal and centrilobular emphysema of the right greater than left lung apices. Smooth interlobular septal thickening of the lungs compatible with mild interstitial edema. Review of the MIP images confirms the above findings CTA HEAD FINDINGS Anterior circulation: No significant stenosis, proximal occlusion, aneurysm, or vascular malformation. Posterior circulation: No significant stenosis, proximal occlusion, aneurysm, or vascular malformation. Venous sinuses: As permitted by contrast timing, patent. Anatomic variants: Patent  anterior communicating artery and diminutive bilateral posterior communicating arteries. Delayed phase: No abnormal intracranial enhancement. Review of the MIP images confirms the above findings IMPRESSION: 1. Patent carotid and vertebral arteries. No dissection, aneurysm, or hemodynamically significant stenosis utilizing NASCET criteria. 2. Patent circle of Willis. No large vessel occlusion, aneurysm, or significant stenosis. 3. Paraseptal and centrilobular emphysema of lung apices. Mild interstitial pulmonary edema. Electronically Signed   By: Kristine Garbe M.D.   On: 03/19/2017 17:26   Ct Head Wo Contrast  Result Date: 03/19/2017 CLINICAL DATA:  Altered consciousness. History of vocal cord cancer. Radiation therapy. EXAM: CT HEAD WITHOUT CONTRAST TECHNIQUE: Contiguous axial images were obtained from the base of the skull through the vertex without intravenous contrast. COMPARISON:  03/20/2013 FINDINGS: Brain: No mass lesion, hemorrhage, hydrocephalus, acute infarct, intra-axial, or extra-axial fluid collection. Vascular: No hyperdense vessel or unexpected calcification. Skull: Similar mild left supraorbital scalp soft tissue fullness. Sinuses/Orbits: Normal imaged portions of the orbits and globes. Mucous retention cysts or polyps in the right maxillary sinus. Minimal ethmoid air cell mucosal thickening. Hypoplastic right frontal sinus. Clear mastoid air cells. Other: None. IMPRESSION: 1.  No acute intracranial abnormality. 2. Mild sinus disease. Electronically Signed   By: Abigail Miyamoto M.D.   On: 03/19/2017 13:58   Ct Angio Neck W Or Wo Contrast  Result Date: 03/19/2017 CLINICAL DATA:  50 y/o M; altered mental status. 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 EXAM: CT ANGIOGRAPHY HEAD AND NECK TECHNIQUE: Multidetector CT imaging of the head and neck was performed using the standard protocol during bolus administration of intravenous contrast. Multiplanar CT image reconstructions and MIPs were obtained  to evaluate the vascular anatomy. Carotid stenosis measurements (when applicable) are obtained utilizing NASCET criteria, using the distal internal carotid diameter as the denominator. CONTRAST:  50 cc Isovue 370 COMPARISON:  03/19/2017 CT head FINDINGS: CTA NECK FINDINGS Aortic arch: Standard branching. Imaged portion shows no evidence of aneurysm or dissection. No significant stenosis of the major arch vessel origins. Right carotid system: No evidence of dissection, stenosis (50% or greater) or occlusion. Mild non stenotic calcified plaque of the right carotid bifurcation. Left carotid system: No evidence of dissection, stenosis (50% or greater) or occlusion. Vertebral arteries: Codominant. No evidence of dissection, stenosis (50% or greater) or occlusion. Skeleton: Negative. Other neck: Debris within oral and nasopharynx is likely due to intubation. Right central venous catheter tip below the field of view in the SVC. Upper chest: Paraseptal and centrilobular emphysema of the right greater than left lung apices. Smooth interlobular septal thickening of the lungs compatible with mild interstitial edema. Review of the MIP images confirms the above findings CTA HEAD FINDINGS Anterior circulation: No significant stenosis, proximal occlusion, aneurysm, or vascular malformation. Posterior circulation: No significant stenosis, proximal occlusion, aneurysm, or vascular malformation. Venous sinuses: As permitted by contrast timing, patent. Anatomic variants: Patent anterior communicating artery and diminutive bilateral posterior communicating arteries. Delayed phase: No abnormal intracranial enhancement. Review of the MIP images confirms the above findings IMPRESSION: 1. Patent carotid and vertebral arteries. No dissection, aneurysm, or hemodynamically significant stenosis utilizing NASCET criteria. 2. Patent circle of Willis. No large vessel occlusion, aneurysm, or significant stenosis. 3. Paraseptal and centrilobular  emphysema of lung apices. Mild interstitial pulmonary edema. Electronically Signed   By: Kristine Garbe M.D.   On: 03/19/2017 17:26   Dg Chest Portable 1 View  Result Date: 03/19/2017 CLINICAL DATA:  Chest pain, loss of consciousness. Endotracheal and OG tube placement. EXAM: PORTABLE CHEST 1 VIEW COMPARISON:  03/19/2017 and prior exams FINDINGS: The cardiomediastinal silhouette is unremarkable. An endotracheal tube with tip 7 cm above the carina, OG tube with tip overlying the mid stomach and right IJ central venous catheter with tip overlying the mid SVC noted. Mild pulmonary vascular congestion identified. There is no evidence of focal airspace disease, pulmonary edema, suspicious pulmonary nodule/mass, pleural effusion, or pneumothorax. No acute bony abnormalities are identified. IMPRESSION: 1. Support apparatus as described. 2. Mild pulmonary vascular congestion. Electronically Signed   By: Dellis Filbert  Hu M.D.   On: 03/19/2017 17:34   Dg Chest Portable 1 View  Result Date: 03/19/2017 CLINICAL DATA:  Altered level of consciousness EXAM: PORTABLE CHEST 1 VIEW COMPARISON:  PA and lateral chest x-ray of May 16, 2016 FINDINGS: Lungs are adequately inflated and clear. The heart and pulmonary vascularity are normal. The mediastinum is normal in width. There is no pleural effusion. There is calcification in the wall of the aortic arch. IMPRESSION: There is no acute cardiopulmonary abnormality. Electronically Signed   By: David  Martinique M.D.   On: 03/19/2017 14:11    Procedures  CRITICAL CARE Performed by: Merrily Pew Total critical care time: 65 minutes Critical care time was exclusive of separately billable procedures and treating other patients. Critical care was necessary to treat or prevent imminent or life-threatening deterioration. Critical care was time spent personally by me on the following activities: development of treatment plan with patient and/or surrogate as well as nursing,  discussions with consultants, evaluation of patient's response to treatment, examination of patient, obtaining history from patient or surrogate, ordering and performing treatments and interventions, ordering and review of laboratory studies, ordering and review of radiographic studies, pulse oximetry and re-evaluation of patient's condition.   Procedure Name: Intubation Date/Time: 03/19/2017 5:31 PM Performed by: Merrily Pew Pre-anesthesia Checklist: Patient identified Oxygen Delivery Method: Nasal cannula Preoxygenation: Pre-oxygenation with 100% oxygen Induction Type: IV induction and Rapid sequence Ventilation: Mask ventilation without difficulty Laryngoscope Size: Miller and 3 Grade View: Grade II Tube size: 7.5 mm Number of attempts: 1 Airway Equipment and Method: Rigid stylet Placement Confirmation: ETT inserted through vocal cords under direct vision Secured at: 23 cm Tube secured with: ETT holder Dental Injury: Teeth and Oropharynx as per pre-operative assessment  Difficulty Due To: Difficulty was anticipated and Difficult Airway-due to vocal cord/laryngeal edema Future Recommendations: Recommend- induction with short-acting agent, and alternative techniques readily available Comments: 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     .Central Line Date/Time: 03/19/2017 5:40 PM Performed by: Merrily Pew Authorized by: Merrily Pew   Consent:    Consent obtained:  Emergent situation Pre-procedure details:    Hand hygiene: Hand hygiene performed prior to insertion     Sterile barrier technique: All elements of maximal sterile technique followed     Skin preparation:  2% chlorhexidine   Skin preparation agent: Skin preparation agent completely dried prior to procedure   Procedure details:    Location:  R internal jugular   Site selection rationale:  [place], ease of use   Patient position:  Flat   Procedural supplies:   Triple lumen   Catheter size:  7.5 Fr   Ultrasound guidance: yes     Sterile ultrasound techniques: Sterile gel and sterile probe covers were used     Number of attempts:  1   Successful placement: yes   Post-procedure details:    Post-procedure:  Dressing applied and line sutured   Assessment:  Blood return through all ports   Patient tolerance of procedure:  Tolerated well, no immediate complications   (including critical care time)  Medications Ordered in ED Medications  naloxone (NARCAN) 2 MG/2ML injection (not administered)  midazolam (VERSED) 2 MG/2ML injection (not administered)  fentaNYL (SUBLIMAZE) 100 MCG/2ML injection (not administered)  fentaNYL (SUBLIMAZE) injection 100 mcg (not administered)  fentaNYL (SUBLIMAZE) injection 100 mcg (not administered)  dextrose 5 %-0.45 % sodium chloride infusion ( Intravenous New Bag/Given 03/19/17 1726)  0.9 %  sodium chloride infusion (not administered)  aspirin chewable tablet 324 mg (not administered)    Or  aspirin suppository 300 mg (not administered)  heparin injection 5,000 Units (not administered)  pantoprazole sodium (PROTONIX) 40 mg/20 mL oral suspension 40 mg (not administered)  0.9 %  sodium chloride infusion (not administered)  albuterol (PROVENTIL,VENTOLIN) solution continuous neb (not administered)  acetaminophen (TYLENOL) tablet 650 mg (not administered)  fentaNYL 255mcg in NS 233mL (38mcg/ml) infusion-PREMIX (not administered)  midazolam (VERSED) injection 1-2 mg (not administered)  norepinephrine (LEVOPHED) 4 mg in dextrose 5 % 250 mL (0.016 mg/mL) infusion (not administered)  sodium chloride 0.9 % bolus 1,000 mL (0 mLs Intravenous Stopped 03/19/17 1405)  sodium chloride 0.9 % bolus 1,000 mL (0 mLs Intravenous Stopped 03/19/17 1448)  naloxone (NARCAN) injection 2 mg (2 mg Intravenous Given 03/19/17 1445)  sodium chloride 0.9 % bolus 1,000 mL (0 mLs Intravenous Stopped 03/19/17 1709)  iopamidol (ISOVUE-370) 76 %  injection (50 mLs  Contrast Given 03/19/17 1638)     Initial Impression / Assessment and Plan / ED Course  I have reviewed the triage vital signs and the nursing notes.  Pertinent labs & imaging results that were available during my care of the patient were reviewed by me and considered in my medical decision making (see chart for details).     Somewhat difficult case in the leg care secondary to the girlfriend stating the patient would not want life extending measures. However the patient's daughter arrived and stated that those absolutely not true. She states that he makes off the hand remarks sometimes about not want to live that certain things happen but he has never really been suicidal or wanting to die. She stated the reason why he stopped chemotherapy was because the treatment was over and he was in remission. I had attempted to contact ethics however they never called me back. As I was not clear that the patient would've wanted nothing done and the next of kin stated that he would want everythin, I felt that maximal care was indicated at that time.  Because of that I contacted neurology to evaluate the patient and started him on Levophed for his blood pressures. I also intubated for airway protection. Neurology recommended he get a CTA to evaluate for a basilar artery occlusion which was normal. The rest of his workup is only positive for multiple drug on his UDS. At this point I don't know if this was an ingestion of some sort of beta blocker, calcium channel blocker or clonidine as he does have hypotension that unexplained and no concurrent tachycardia.  Tried Narcan without any response.  Also considered neurogenic shock but no recent trauma or evidence of spinal cord injury.  Also possibly hypothyroidism and he does have a history of hypothyroidism so could be myxedema coma however is not hypothermic.  Blood sugar is normal.  No evidence of stroke.  No obvious infection.  Discussed  with ICU who will admit for the same.    Final Clinical Impressions(s) / ED Diagnoses   Final diagnoses:  Hypotensive episode  Unresponsive  Shock ([place])     Momen Ham, Corene Cornea, MD 03/19/17 1742

## 2017-03-19 NOTE — ED Notes (Signed)
Friend said she remembered patient did tell her he was unable to urinate this morning and did not eat anything.

## 2017-03-19 NOTE — ED Notes (Signed)
Condom cath placed on pt 

## 2017-03-19 NOTE — ED Triage Notes (Addendum)
Pt arrives from home by [facility] after being unresponsive per wife after donating plasma. EMS reports pt is responsive to touch and painful stimulus, however pt is deaf.  Pt will not open on arrival pt is responsive to touch and moves his hand away when I touch his hand. Deaf interpretor has been called. Pt is warm and dry.

## 2017-03-19 NOTE — ED Notes (Signed)
Hermann Dottavio (daughter) and next of kin: [phone]

## 2017-03-19 NOTE — ED Notes (Signed)
Xray called for portable  

## 2017-03-19 NOTE — ED Notes (Signed)
Checked patient rectal temp it was 97.6 notified RN of blood sugar

## 2017-03-19 NOTE — Progress Notes (Signed)
Patient transferred to 2M03 without incidence.  Report given to receiving RT.

## 2017-03-19 NOTE — ED Notes (Signed)
Friend now stated after patient came home donating plasma patient and friend used IV heroin.

## 2017-03-19 NOTE — ED Notes (Signed)
EEG at bedside.

## 2017-03-19 NOTE — ED Notes (Signed)
Friend stated patient with [facility] for his cancer 3 months ago.

## 2017-03-19 NOTE — Progress Notes (Signed)
EEG completed, results pending. 

## 2017-03-20 ENCOUNTER — Inpatient Hospital Stay (HOSPITAL_COMMUNITY): Payer: Self-pay

## 2017-03-20 DIAGNOSIS — T50904S Poisoning by unspecified drugs, medicaments and biological substances, undetermined, sequela: Secondary | ICD-10-CM

## 2017-03-20 DIAGNOSIS — J9601 Acute respiratory failure with hypoxia: Secondary | ICD-10-CM

## 2017-03-20 DIAGNOSIS — I959 Hypotension, unspecified: Secondary | ICD-10-CM

## 2017-03-20 LAB — BLOOD GAS, ARTERIAL
Acid-base deficit: 2 mmol/L (ref 0.0–2.0)
Bicarbonate: 21.4 mmol/L (ref 20.0–28.0)
DRAWN BY: 362771
FIO2: 35
MECHVT: 620 mL
O2 SAT: 96.6 %
PEEP/CPAP: 5 cmH2O
PH ART: 7.451 — AB (ref 7.350–7.450)
PO2 ART: 86.2 mmHg (ref 83.0–108.0)
Patient temperature: 98.6
RATE: 18 resp/min
pCO2 arterial: 31.1 mmHg — ABNORMAL LOW (ref 32.0–48.0)

## 2017-03-20 LAB — URINE CULTURE: Culture: NO GROWTH

## 2017-03-20 LAB — GLUCOSE, CAPILLARY
GLUCOSE-CAPILLARY: 143 mg/dL — AB (ref 65–99)
GLUCOSE-CAPILLARY: 114 mg/dL — AB (ref 65–99)
GLUCOSE-CAPILLARY: 109 mg/dL — AB (ref 65–99)
Glucose-Capillary: 122 mg/dL — ABNORMAL HIGH (ref 65–99)
Glucose-Capillary: 120 mg/dL — ABNORMAL HIGH (ref 65–99)
Glucose-Capillary: 117 mg/dL — ABNORMAL HIGH (ref 65–99)

## 2017-03-20 LAB — CBC
HEMATOCRIT: 38.5 % — AB (ref 39.0–52.0)
Hemoglobin: 13 g/dL (ref 13.0–17.0)
MCH: 32.1 pg (ref 26.0–34.0)
MCHC: 33.8 g/dL (ref 30.0–36.0)
MCV: 95.1 fL (ref 78.0–100.0)
PLATELETS: 156 10*3/uL (ref 150–400)
RBC: 4.05 MIL/uL — ABNORMAL LOW (ref 4.22–5.81)
RDW: 15.6 % — AB (ref 11.5–15.5)
WBC: 6.7 10*3/uL (ref 4.0–10.5)

## 2017-03-20 LAB — PROCALCITONIN: Procalcitonin: <0.1 ng/mL

## 2017-03-20 LAB — BASIC METABOLIC PANEL
Anion gap: 4 — ABNORMAL LOW (ref 5–15)
BUN: 13 mg/dL (ref 6–20)
CALCIUM: 7.1 mg/dL — AB (ref 8.9–10.3)
CO2: 24 mmol/L (ref 22–32)
Chloride: 115 mmol/L — ABNORMAL HIGH (ref 101–111)
Creatinine, Ser: 1.17 mg/dL (ref 0.61–1.24)
GFR calc Af Amer: >60 mL/min (ref >60)
GLUCOSE: 152 mg/dL — AB (ref 65–99)
POTASSIUM: 3.4 mmol/L — AB (ref 3.5–5.1)
Sodium: 143 mmol/L (ref 135–145)

## 2017-03-20 LAB — ECHOCARDIOGRAM COMPLETE
HEIGHTINCHES: 72 in
Weight: 2349.22 oz

## 2017-03-20 LAB — T4, FREE: Free T4: 0.25 ng/dL — ABNORMAL LOW (ref 0.61–1.12)

## 2017-03-20 LAB — PHOSPHORUS: PHOSPHORUS: 1.7 mg/dL — AB (ref 2.5–4.6)

## 2017-03-20 LAB — MAGNESIUM: MAGNESIUM: 2 mg/dL (ref 1.7–2.4)

## 2017-03-20 MED ORDER — VITAL AF 1.2 CAL PO LIQD
1000.0000 mL | ORAL | Status: DC
  Administered 2017-03-21: 1000 mL

## 2017-03-20 MED ORDER — NOREPINEPHRINE BITARTRATE 1 MG/ML IV SOLN
0.0000 ug/min | INTRAVENOUS | Status: DC
  Administered 2017-03-20: 20 ug/min via INTRAVENOUS
  Filled 2017-03-20: qty 16

## 2017-03-20 MED ORDER — ORAL CARE MOUTH RINSE
15.0000 mL | Freq: Four times a day (QID) | OROMUCOSAL | Status: DC
  Administered 2017-03-20 – 2017-03-21 (×4): 15 mL via OROMUCOSAL

## 2017-03-20 MED ORDER — DOPAMINE-DEXTROSE 3.2-5 MG/ML-% IV SOLN: 0.0000 ug/kg/min | INTRAVENOUS | Status: DC

## 2017-03-20 MED ORDER — CHLORHEXIDINE GLUCONATE 0.12% ORAL RINSE (MEDLINE KIT)
15.0000 mL | Freq: Two times a day (BID) | OROMUCOSAL | Status: DC
  Administered 2017-03-20 – 2017-03-21 (×3): 15 mL via OROMUCOSAL

## 2017-03-20 MED ORDER — ATROPINE SULFATE 1 MG/10ML IJ SOSY
PREFILLED_SYRINGE | INTRAMUSCULAR | Status: AC
  Filled 2017-03-20: qty 10

## 2017-03-20 MED ORDER — PRO-STAT SUGAR FREE PO LIQD: 30.0000 mL | Freq: Two times a day (BID) | ORAL | Status: DC

## 2017-03-20 MED ORDER — CHLORHEXIDINE GLUCONATE CLOTH 2 % EX PADS
6.0000 | MEDICATED_PAD | Freq: Every day | CUTANEOUS | Status: DC
  Administered 2017-03-20: 6 via TOPICAL

## 2017-03-20 MED ORDER — POTASSIUM PHOSPHATES 15 MMOLE/5ML IV SOLN
30.0000 mmol | Freq: Once | INTRAVENOUS | Status: AC
  Administered 2017-03-20: 30 mmol via INTRAVENOUS
  Filled 2017-03-20: qty 10

## 2017-03-20 MED ORDER — LACTATED RINGERS IV BOLUS (SEPSIS)
750.0000 mL | Freq: Once | INTRAVENOUS | Status: AC
  Administered 2017-03-20: 750 mL via INTRAVENOUS

## 2017-03-20 MED ORDER — DEXTROSE 5 % IV SOLN
0.0000 ug/min | INTRAVENOUS | Status: DC
  Administered 2017-03-20: 6 ug/min via INTRAVENOUS
  Filled 2017-03-20 (×3): qty 16

## 2017-03-20 MED ORDER — HYDROCORTISONE NA SUCCINATE PF 100 MG IJ SOLR
50.0000 mg | Freq: Four times a day (QID) | INTRAMUSCULAR | Status: DC
  Administered 2017-03-20 – 2017-03-26 (×24): 50 mg via INTRAVENOUS
  Filled 2017-03-20 (×3): qty 2
  Filled 2017-03-20: qty 1
  Filled 2017-03-20 (×5): qty 2
  Filled 2017-03-20: qty 1
  Filled 2017-03-20 (×7): qty 2
  Filled 2017-03-20 (×2): qty 1
  Filled 2017-03-20 (×2): qty 2
  Filled 2017-03-20: qty 1
  Filled 2017-03-20 (×5): qty 2

## 2017-03-20 MED ORDER — SODIUM CHLORIDE 0.9% FLUSH
10.0000 mL | Freq: Two times a day (BID) | INTRAVENOUS | Status: DC
  Administered 2017-03-20 – 2017-03-21 (×3): 10 mL

## 2017-03-20 MED ORDER — SODIUM CHLORIDE 0.9% FLUSH: 10.0000 mL | INTRAVENOUS | Status: DC | PRN

## 2017-03-20 NOTE — Progress Notes (Signed)
Per pt's daughter, Pt does not have a POA or a living will at this time. Daughter suggested that it may fall back to her Verline Lema) when it comes to making decisions.    [name], MSW, [facility] Clinical Social Worker [phone]

## 2017-03-20 NOTE — Progress Notes (Signed)
PULMONARY / CRITICAL CARE MEDICINE   Name: [name] MRN: [id number] DOB: 1965-11-04    ADMISSION DATE:  03/19/2017 CONSULTATION DATE:  03/19/2017  REFERRING MD:  Dr. Dayna Barker  CHIEF COMPLAINT:  Unresponsive  HISTORY OF PRESENT ILLNESS:  HPI obtained from medical chart review as patient is currently intubated and sedated.  51 year old male with PMH as below significant for but not limited to hypothyroidism, dysphagia, ? 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  Patient had reportedly donated plasma this morning around 0800 and felt dizzy on the way home.  Friend reported he injected heroin at some point and then found unresponsive by family/ girlfriend around 1300.  Upon arrival to ER, patient obtunded and hypotensive despite 3L NS IVF.  No response to narcan.  UDS positive for opiates, cocaine, benzodiazepines, and THC.  Labs noted for k 3.6, glucose 93, sCr 1.48, AST 48, ammonia 51, WBC 4.0, UA neg, EKG non-acute, CT head negative. Patient intubated for airway protection and CVL placed.  Patient with downward gaze.  Neurology consulted.  CTA head/ neck pending. PCCM to admit.    SUBJECTIVE:  Completely unresponsive on 20 mcg of levo  VITAL SIGNS: BP (!) 84/63   Pulse (!) 54   Temp (!) 97.5 F (36.4 C) (Oral)   Resp 18   Ht 6' (1.829 m)   Wt 66.6 kg (146 lb 13.2 oz)   SpO2 98%   BMI 19.91 kg/m   HEMODYNAMICS: CVP:  [12 mmHg-14 mmHg] 12 mmHg  VENTILATOR SETTINGS: Vent Mode: PRVC FiO2 (%):  [35 %-100 %] 40 % Set Rate:  [18 bmp] 18 bmp Vt Set:  [620 mL] 620 mL PEEP:  [5 cmH20] 5 cmH20 Plateau Pressure:  [13 cmH20-19 cmH20] 19 cmH20  INTAKE / OUTPUT: I/O last 3 completed shifts: In: 2966.4 [I.V.:1966.4; IV Piggyback:1000] Out: 0737 [Urine:3275; Emesis/NG output:150]  PHYSICAL EXAMINATION: General:  Acutely ill adult male, NAD, unresponsive HEENT:  [place]/AT, PERRL, EOM-I and MMM Neuro: Unresponsive, not following any commands, does not withdraw to pain on sedation CV: RRR, Nl S1/S2, -M/R/G. PULM: Bibasilar crackles GI: Soft, NT, ND and +BS Extremities: -edema and -tenderness Skin: no rashes or lesions, tattoos   LABS:  BMET  Recent Labs Lab 03/19/17 1343 03/19/17 1752 03/20/17 0230  NA 140 138 143  K 3.6 4.0 3.4*  CL 110 110 115*  CO2 24 21* 24  BUN 19 16 13   CREATININE 1.48* 1.43* 1.17  GLUCOSE 93 169* 152*   Electrolytes  Recent Labs Lab 03/19/17 1343 03/19/17 1752 03/20/17 0230  CALCIUM 7.6* 6.9* 7.1*  MG  --  1.6* 2.0  PHOS  --  2.9 1.7*    CBC  Recent Labs Lab 03/19/17 1343 03/19/17 1752 03/20/17 0230  WBC 4.0 5.4 6.7  HGB 12.7* 13.3 13.0  HCT 38.4* 40.7 38.5*  PLT 144* 147* 156    Coag's  Recent Labs Lab 03/19/17 1752  APTT 31  INR 1.10    Sepsis Markers  Recent Labs Lab 03/19/17 1343 03/19/17 1752 03/20/17 0230  LATICACIDVEN 1.7  --   --   PROCALCITON  --  <0.10 <0.10    ABG  Recent Labs Lab 03/20/17 0220  PHART 7.451*  PCO2ART 31.1*  PO2ART 86.2    Liver Enzymes  Recent Labs Lab 03/19/17 1343 03/19/17 1752  AST 48* 46*  ALT 25 25  ALKPHOS 23* 25*  BILITOT 0.7 0.7  ALBUMIN 2.4* 2.6*    Cardiac Enzymes No results for input(s): TROPONINI, PROBNP in the last 168 hours.  Glucose  Recent Labs Lab 03/19/17 1339 03/19/17 2140 03/19/17 2331 03/20/17 0300 03/20/17 0716  GLUCAP 97 196* 182* 143* 122*    Imaging Ct Angio Head W Or Wo Contrast  Result Date: 03/19/2017 CLINICAL DATA:  51 y/o M; altered mental status. 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 EXAM: CT ANGIOGRAPHY HEAD AND NECK TECHNIQUE: Multidetector CT imaging of the head and neck was performed using the standard protocol during bolus administration of intravenous contrast. Multiplanar CT image reconstructions and MIPs were obtained to evaluate the vascular anatomy. Carotid stenosis measurements  (when applicable) are obtained utilizing NASCET criteria, using the distal internal carotid diameter as the denominator. CONTRAST:  50 cc Isovue 370 COMPARISON:  03/19/2017 CT head FINDINGS: CTA NECK FINDINGS Aortic arch: Standard branching. Imaged portion shows no evidence of aneurysm or dissection. No significant stenosis of the major arch vessel origins. Right carotid system: No evidence of dissection, stenosis (50% or greater) or occlusion. Mild non stenotic calcified plaque of the right carotid bifurcation. Left carotid system: No evidence of dissection, stenosis (50% or greater) or occlusion. Vertebral arteries: Codominant. No evidence of dissection, stenosis (50% or greater) or occlusion. Skeleton: Negative. Other neck: Debris within oral and nasopharynx is likely due to intubation. Right central venous catheter tip below the field of view in the SVC. Upper chest: Paraseptal and centrilobular emphysema of the right greater than left lung apices. Smooth interlobular septal thickening of the lungs compatible with mild interstitial edema. Review of the MIP images confirms the above findings CTA HEAD FINDINGS Anterior circulation: No significant stenosis, proximal occlusion, aneurysm, or vascular malformation. Posterior circulation: No significant stenosis, proximal occlusion, aneurysm, or vascular malformation. Venous sinuses: As permitted by contrast timing, patent. Anatomic variants: Patent anterior communicating artery and diminutive bilateral posterior communicating arteries. Delayed phase: No abnormal intracranial enhancement. Review of the MIP images confirms the above findings IMPRESSION: 1. Patent carotid and vertebral arteries. No dissection, aneurysm, or hemodynamically significant stenosis utilizing NASCET criteria. 2. Patent circle of Willis. No large vessel occlusion, aneurysm, or significant stenosis. 3. Paraseptal and centrilobular emphysema of lung apices. Mild interstitial pulmonary edema.  Electronically Signed   By: Kristine Garbe M.D.   On: 03/19/2017 17:26   Ct Head Wo Contrast  Result Date: 03/19/2017 CLINICAL DATA:  Altered consciousness. History of vocal cord cancer. Radiation therapy. EXAM: CT HEAD WITHOUT CONTRAST TECHNIQUE: Contiguous axial images were obtained from the base of the skull through the vertex without intravenous contrast. COMPARISON:  03/20/2013 FINDINGS: Brain: No mass lesion, hemorrhage, hydrocephalus, acute infarct, intra-axial, or extra-axial fluid collection. Vascular: No hyperdense vessel or unexpected calcification. Skull: Similar mild left supraorbital scalp soft tissue fullness. Sinuses/Orbits: Normal imaged portions of the orbits and globes. Mucous retention cysts or polyps in the right maxillary sinus. Minimal ethmoid air cell mucosal thickening. Hypoplastic right frontal sinus. Clear mastoid air cells. Other: None. IMPRESSION: 1.  No acute intracranial abnormality. 2. Mild sinus disease. Electronically Signed   By: Abigail Miyamoto M.D.   On: 03/19/2017 13:58   Ct Angio Neck W Or Wo Contrast  Result Date: 03/19/2017 CLINICAL DATA:  51 y/o M; altered mental status. 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 EXAM: CT ANGIOGRAPHY HEAD AND NECK TECHNIQUE: Multidetector CT imaging of the head and neck was performed using the standard protocol during bolus administration of intravenous contrast. Multiplanar CT image reconstructions  and MIPs were obtained to evaluate the vascular anatomy. Carotid stenosis measurements (when applicable) are obtained utilizing NASCET criteria, using the distal internal carotid diameter as the denominator. CONTRAST:  50 cc Isovue 370 COMPARISON:  03/19/2017 CT head FINDINGS: CTA NECK FINDINGS Aortic arch: Standard branching. Imaged portion shows no evidence of aneurysm or dissection. No significant stenosis of the major arch vessel origins. Right carotid system: No evidence of dissection, stenosis (50% or greater) or occlusion. Mild non  stenotic calcified plaque of the right carotid bifurcation. Left carotid system: No evidence of dissection, stenosis (50% or greater) or occlusion. Vertebral arteries: Codominant. No evidence of dissection, stenosis (50% or greater) or occlusion. Skeleton: Negative. Other neck: Debris within oral and nasopharynx is likely due to intubation. Right central venous catheter tip below the field of view in the SVC. Upper chest: Paraseptal and centrilobular emphysema of the right greater than left lung apices. Smooth interlobular septal thickening of the lungs compatible with mild interstitial edema. Review of the MIP images confirms the above findings CTA HEAD FINDINGS Anterior circulation: No significant stenosis, proximal occlusion, aneurysm, or vascular malformation. Posterior circulation: No significant stenosis, proximal occlusion, aneurysm, or vascular malformation. Venous sinuses: As permitted by contrast timing, patent. Anatomic variants: Patent anterior communicating artery and diminutive bilateral posterior communicating arteries. Delayed phase: No abnormal intracranial enhancement. Review of the MIP images confirms the above findings IMPRESSION: 1. Patent carotid and vertebral arteries. No dissection, aneurysm, or hemodynamically significant stenosis utilizing NASCET criteria. 2. Patent circle of Willis. No large vessel occlusion, aneurysm, or significant stenosis. 3. Paraseptal and centrilobular emphysema of lung apices. Mild interstitial pulmonary edema. Electronically Signed   By: Kristine Garbe M.D.   On: 03/19/2017 17:26   Dg Chest Port 1 View  Result Date: 03/19/2017 CLINICAL DATA:  Central line placement EXAM: PORTABLE CHEST 1 VIEW COMPARISON:  03/19/2017 FINDINGS: Endotracheal tube tip is approximately 7.5 cm superior to the carina. Esophageal tube tip is below the diaphragm but not included. Right-sided central venous catheter tip overlies the upper SVC. No pneumothorax. Development of  right basilar opacity. IMPRESSION: 1. Right-sided central venous catheter tip overlies the SVC. No pneumothorax. 2. Development of right basilar opacity, which may reflect atelectasis or aspiration. Electronically Signed   By: Donavan Foil M.D.   On: 03/19/2017 21:43   Dg Chest Portable 1 View  Result Date: 03/19/2017 CLINICAL DATA:  Chest pain, loss of consciousness. Endotracheal and OG tube placement. EXAM: PORTABLE CHEST 1 VIEW COMPARISON:  03/19/2017 and prior exams FINDINGS: The cardiomediastinal silhouette is unremarkable. An endotracheal tube with tip 7 cm above the carina, OG tube with tip overlying the mid stomach and right IJ central venous catheter with tip overlying the mid SVC noted. Mild pulmonary vascular congestion identified. There is no evidence of focal airspace disease, pulmonary edema, suspicious pulmonary nodule/mass, pleural effusion, or pneumothorax. No acute bony abnormalities are identified. IMPRESSION: 1. Support apparatus as described. 2. Mild pulmonary vascular congestion. Electronically Signed   By: Margarette Canada M.D.   On: 03/19/2017 17:34   Dg Chest Portable 1 View  Result Date: 03/19/2017 CLINICAL DATA:  Altered level of consciousness EXAM: PORTABLE CHEST 1 VIEW COMPARISON:  PA and lateral chest x-ray of May 16, 2016 FINDINGS: Lungs are adequately inflated and clear. The heart and pulmonary vascularity are normal. The mediastinum is normal in width. There is no pleural effusion. There is calcification in the wall of the aortic arch. IMPRESSION: There is no acute cardiopulmonary abnormality. Electronically Signed  By: David  Martinique M.D.   On: 03/19/2017 14:11   STUDIES:  8/27 CT head >> neg for acute, mild sinus disease 8/27 CTA head/ neck >>   CULTURES: 8/27 BCx 2 >> 8/27 UC >>  ANTIBIOTICS: none  SIGNIFICANT EVENTS: 8/27 Admit  LINES/TUBES: 8/27 ETT >> 8/27 R TL CVC >>  DISCUSSION: 51 year old male with history of vocal cord cancer that supposedly he  is getting treatment for but took himself off chemo 3 wks ago who used benzos, THC, narcotic and cocaine and was found unresponsive.  On exam, lungs are clear but patient is still paralyzed so difficult to ascertain full exam.  I reviewed CXR myself, no acute disease noted and ETT is in good position on the head and neck CT.  ASSESSMENT / PLAN:  PULMONARY A: VDRF due to inability to protect his airway. P:   - Begin PS trials if able to wake up. - Adjust vent for ABG - D/C fentanyl - RLL opacification, however, no WBC and no fever, to likely atelectasis will not start abx, procal <0.10  CARDIOVASCULAR A:  Circulatory shock due to polypharmacy most likely, no signs of infection here P:  - IVF NS at 75 ml/hr - Levophed titrate for MAP of 65 - CVP q4 hours - 2D echo for ?PAP and EF  RENAL A:   Hyperkalemia that resolved and mild AKI P:   - Hydrate as above - BMET in AM - Replace electrolytes as indicated (K and Phos)  GASTROINTESTINAL A:   No active issues P:   - PPI - Consult nutrition for TF as per nutrition  HEMATOLOGIC A:   No active issues P:  - CBC in AM - Transfuse per ICU protocol  INFECTIOUS A:   No active issues P:   - Check PCT - Monitor WBC and fever curve  ENDOCRINE A:   No active issues   P:   - Cortisol 14.9 with shock - Start stress dose steroids  NEUROLOGIC A:   Unresponsive due to drug OD ?CVA P:   RASS goal: 0 - Per neuro - D/C fentanyl drip - D/C PRN versed - EEG per neuro - CTA noted.  FAMILY  - Updates: No family bedside to update.  - Inter-disciplinary family meet or Palliative Care meeting due by:  day 7  The patient is critically ill with multiple organ systems failure and requires high complexity decision making for assessment and support, frequent evaluation and titration of therapies, application of advanced monitoring technologies and extensive interpretation of multiple databases.   Critical Care Time devoted to  patient care services described in this note is  35  Minutes. This time reflects time of care of this signee Dr Jennet Maduro. This critical care time does not reflect procedure time, or teaching time or supervisory time of PA/NP/Med student/Med Resident etc but could involve care discussion time.  Rush Farmer, M.D. [facility] Pulmonary/Critical Care Medicine. Pager: [phone]. After hours pager: [phone].  03/19/2017, 5:21 PM

## 2017-03-20 NOTE — Progress Notes (Signed)
  Echocardiogram 2D Echocardiogram has been performed.  Tresa Res 03/20/2017, 12:12 PM

## 2017-03-20 NOTE — Clinical Social Work Note (Addendum)
Clinical Social Work Assessment  Patient Details  Name: [name] MRN: [id number] Date of Birth: 1966/03/21  Date of referral:  03/20/17               Reason for consult:  Substance Use/ETOH Abuse                Permission sought to share information with:  Family Supports Permission granted to share information::  Yes, Verbal Permission Granted  Name::     Tareek Sabo  (pt's daughter)  Agency::     Relationship::     Contact Information:     Housing/Transportation Living arrangements for the past 2 months:  Single Family Home (with girlfriend Lattie Haw. ) Source of Information:  Adult Children Caroleen Hamman ) Patient Interpreter Needed:  None Criminal Activity/Legal Involvement Pertinent to Current Situation/Hospitalization:  No - Comment as needed Significant Relationships:  Adult Children, Significant Other, Friend Lives with:  Self, Significant Other Do you feel safe going back to the place where you live?  Yes Need for family participation in patient care:  Yes (Comment)  Care giving concerns:  CSW spoke with pt's daughter Verline Lema to complete assessment. At this time Verline Lema has not expressed any concerns to [place].    Social Worker assessment / plan:  CSW spoke with pt's daughter via phone being that pt is still intubated. CSW was informed that pt is from home with pt's girlfriend Lattie Haw). Verline Lema reports that pt and girlfriend have lived together for about 4-5 months. She also reports that pt has never really as a stable place to live due to divorces and other stressors in life. Supports for pt include daughter, a friend named International aid/development worker, and a Theme park manager that has been at the hospital with pt for awhile. CSW was informed that Lattie Haw isn't much of a support for pt due to her being deaf. CSW was informed that pt may be open to rehab if suggested at the time of discharge. At this time per [facility] pt does not have a POA, however Kaitlyn suggested that it would fall back to her if anything  were to happen to pt.   Employment status:  Other (Comment) Insurance information:  Self Pay (Medicaid Pending) PT Recommendations:  Not assessed at this time Information / Referral to community resources:     Patient/Family's Response to care:  Pt's daughter appeared to be understanding and accepting of CSW plan of care at this time.   Patient/Family's Understanding of and Emotional Response to Diagnosis, Current Treatment, and Prognosis:  No further questions or concerns have been presented to CSW at this time.   Emotional Assessment Appearance:  Appears stated age Attitude/Demeanor/Rapport:  Unable to Assess (pt is intubated at this time. ) Affect (typically observed):  Unable to Assess (pt is intubated ) Orientation:   (pt is intubated at this time. ) Alcohol / Substance use:  Tobacco Use Psych involvement (Current and /or in the community):  No (Comment)  Discharge Needs  Concerns to be addressed:  No discharge needs identified Readmission within the last 30 days:  No Current discharge risk:  None Barriers to Discharge:  No Barriers Identified   Wetzel Bjornstad, [place] 03/20/2017, 1:51 PM

## 2017-03-20 NOTE — Progress Notes (Addendum)
Initial Nutrition Assessment  DOCUMENTATION CODES:   Not applicable  INTERVENTION:    Vital AF 1.2 at 65 ml/h (1560 ml per day)  Provides 1872 kcal, 117 gm protein, 1265 ml free water daily  Monitor magnesium, potassium, and phosphorus daily for at least 3 days, MD to replete as needed, as pt is at risk for refeeding syndrome given baseline low phosphorus and potassium  NUTRITION DIAGNOSIS:   Inadequate oral intake related to inability to eat as evidenced by NPO status.  GOAL:   Patient will meet greater than or equal to 90% of their needs  MONITOR:   Vent status, Labs, TF tolerance, I & O's  REASON FOR ASSESSMENT:   Consult Enteral/tube feeding initiation and management  ASSESSMENT:   51 yo male with PMH of hypothyroidism, dysphagia, chronic pain, vocal cord cancer s/p SRT 2014 who was brought to the ED on 8/27 after being found unresponsive at home after injecting Heroin earlier in the day. Required intubation on admission.  Received MD Consult for TF initiation and management. OGT in place. Nutrition-Focused physical exam completed. Findings are no fat depletion, mild muscle depletion, and unable to assess edema.  Patient is currently intubated on ventilator support MV: 10.8 L/min Temp (24hrs), Avg:96.9 F (36.1 C), Min:94 F (34.4 C), Max:99.2 F (37.3 C)  Labs reviewed: potassium 3.4 (L), phosphorus 1.7 (L) CBG's: 143-122 Medications reviewed and include K Phos.  Diet Order:  Diet NPO time specified  Skin:  Reviewed, no issues  Last BM:  unknown  Height:   Ht Readings from Last 1 Encounters:  03/19/17 6' (1.829 m)    Weight:   Wt Readings from Last 1 Encounters:  03/20/17 146 lb 13.2 oz (66.6 kg)    Ideal Body Weight:  80.9 kg  BMI:  Body mass index is 19.91 kg/m.  Estimated Nutritional Needs:   Kcal:  4235  Protein:  100-115 gm  Fluid:  2 L  EDUCATION NEEDS:   No education needs identified at this time  Molli Barrows, [place],  [place], [place] Pager [phone] After Hours Pager [phone]

## 2017-03-20 NOTE — Progress Notes (Signed)
   03/20/17 1523  Vent Select  Invasive or Noninvasive Invasive  Adult Vent Y  Airway 7.5 mm  Placement Date/Time: 03/19/17 1612   Placed By: ED Physician  Airway Device: Endotracheal Tube  Laryngoscope Blade: Miller;3  ETT Types: Subglottic  Size (mm): 7.5 mm  Cuffed: Cuffed  Insertion attempts: 1  Airway Equipment: Stylet  Placement Confirma...  Secured at (cm) 23 cm  Measured From Lips  Secured Location Right  Secured By Actuary Repositioned Yes  Adult Ventilator Settings  Vent Type Servo i  Humidity HME  Vent Mode PRVC  Vt Set 620 mL  Set Rate 18 bmp  FiO2 (%) 40 %  I Time 0.9 Sec(s)  PEEP 5 cmH20  Adult Ventilator Measurements  Peak Airway Pressure 21 L/min  Mean Airway Pressure 9 cmH20  Plateau Pressure 18 cmH20  Resp Rate Spontaneous 0 br/min  Resp Rate Total 18 br/min  Exhaled Vt 621 mL  Measured Ve 10.8 mL  I:E Ratio Measured 1:2.7  SpO2 100 %  Adult Ventilator Alarms  Alarms On Y  Breath Sounds  Bilateral Breath Sounds Clear;Diminished    Placed patient on rest mode due to no patient effort while weaning.  Will attempt again at a later time.

## 2017-03-20 NOTE — Progress Notes (Signed)
SLP Cancellation Note  Patient Details Name: [name] MRN: [id number] DOB: 1966/05/16   Cancelled treatment:       Reason Eval/Treat Not Completed: Patient not medically ready. Intubated   Meisha Salone, Katherene Ponto 03/20/2017, 7:52 AM

## 2017-03-20 NOTE — Progress Notes (Signed)
Subjective: Much improved  Exam: Vitals:   03/20/17 1115 03/20/17 1131  BP: (!) 80/53   Pulse: 71   Resp: 16   Temp:  99.2 F (37.3 C)  SpO2: 97%    Gen: In bed, NAD Resp: non-labored breathing, no acute distress Abd: soft, nt  Neuro: MS: Opens eyes to voice, follows commands to show thumbs and wiggle toes CN: Pupils reactive to light bilaterally Motor: He moves all extremities to command, but does not cooperate with formal strength testing Sensory: Responds to noxious stimulation in all 4 extremities  Pertinent Labs: Hypocalcemia, elevated creatinine  Impression: 51 year old male who presented following likely overdose with altered mental status and refractory shock. He was hypotensive for a period of time, I wonder if that played the major role in his mental status change or if it was directly related to whatver substance he took.  Recommendations: 1) MRI brain 2) we will continue to follow  Roland Rack, MD [facility] [phone]  If 7pm- 7am, please page neurology on call as listed in [place].

## 2017-03-20 NOTE — Care Management Note (Signed)
Case Management Note  Patient Details  Name: [name] MRN: [id number] Date of Birth: Sep 20, 1965  Subjective/Objective:   Pt admitted with RF - went unresponsive               Action/Plan:   Pt is independent from home with girlfriend.  Pt recently completed chemo for throat cancer .  Pt is ventilated.  Pt has positive drug screen - CSW consulted.  CM will continue to follow for discharge needs   Expected Discharge Date:                  Expected Discharge Plan:  Home/Self Care  In-House Referral:  Clinical Social Work  Discharge planning Services  CM Consult  Post Acute Care Choice:    Choice offered to:     DME Arranged:    DME Agency:     HH Arranged:    HH Agency:     Status of Service:     If discussed at [employer], dates discussed:    Additional Comments:  Maryclare Labrador, RN 03/20/2017, 9:15 AM

## 2017-03-21 ENCOUNTER — Inpatient Hospital Stay (HOSPITAL_COMMUNITY): Payer: Self-pay

## 2017-03-21 DIAGNOSIS — R4189 Other symptoms and signs involving cognitive functions and awareness: Secondary | ICD-10-CM

## 2017-03-21 DIAGNOSIS — R579 Shock, unspecified: Secondary | ICD-10-CM

## 2017-03-21 DIAGNOSIS — T50901A Poisoning by unspecified drugs, medicaments and biological substances, accidental (unintentional), initial encounter: Secondary | ICD-10-CM

## 2017-03-21 LAB — BLOOD GAS, ARTERIAL
ACID-BASE DEFICIT: 2.3 mmol/L — AB (ref 0.0–2.0)
BICARBONATE: 20.8 mmol/L (ref 20.0–28.0)
Drawn by: 31777
FIO2: 40
LHR: 18 {breaths}/min
O2 Saturation: 96.1 %
PEEP/CPAP: 5 cmH2O
PO2 ART: 80.6 mmHg — AB (ref 83.0–108.0)
Patient temperature: 98.6
VT: 620 mL
pCO2 arterial: 28.6 mmHg — ABNORMAL LOW (ref 32.0–48.0)
pH, Arterial: 7.475 — ABNORMAL HIGH (ref 7.350–7.450)

## 2017-03-21 LAB — BASIC METABOLIC PANEL
ANION GAP: 5 (ref 5–15)
BUN: 10 mg/dL (ref 6–20)
CHLORIDE: 115 mmol/L — AB (ref 101–111)
CO2: 23 mmol/L (ref 22–32)
Calcium: 7 mg/dL — ABNORMAL LOW (ref 8.9–10.3)
Creatinine, Ser: 1.26 mg/dL — ABNORMAL HIGH (ref 0.61–1.24)
GFR calc Af Amer: >60 mL/min (ref >60)
Glucose, Bld: 138 mg/dL — ABNORMAL HIGH (ref 65–99)
POTASSIUM: 3.4 mmol/L — AB (ref 3.5–5.1)
SODIUM: 143 mmol/L (ref 135–145)

## 2017-03-21 LAB — CBC
HCT: 37.7 % — ABNORMAL LOW (ref 39.0–52.0)
HEMOGLOBIN: 12.4 g/dL — AB (ref 13.0–17.0)
MCH: 31.3 pg (ref 26.0–34.0)
MCHC: 32.9 g/dL (ref 30.0–36.0)
MCV: 95.2 fL (ref 78.0–100.0)
Platelets: 169 10*3/uL (ref 150–400)
RBC: 3.96 MIL/uL — AB (ref 4.22–5.81)
RDW: 15.7 % — ABNORMAL HIGH (ref 11.5–15.5)
WBC: 9.4 10*3/uL (ref 4.0–10.5)

## 2017-03-21 LAB — GLUCOSE, CAPILLARY
GLUCOSE-CAPILLARY: 119 mg/dL — AB (ref 65–99)
Glucose-Capillary: 141 mg/dL — ABNORMAL HIGH (ref 65–99)
Glucose-Capillary: 135 mg/dL — ABNORMAL HIGH (ref 65–99)

## 2017-03-21 LAB — MAGNESIUM: MAGNESIUM: 1.7 mg/dL (ref 1.7–2.4)

## 2017-03-21 LAB — PROCALCITONIN: Procalcitonin: <0.1 ng/mL

## 2017-03-21 LAB — PHOSPHORUS: Phosphorus: 2.7 mg/dL (ref 2.5–4.6)

## 2017-03-21 MED ORDER — POTASSIUM CHLORIDE 20 MEQ/15ML (10%) PO SOLN
20.0000 meq | ORAL | Status: DC
  Administered 2017-03-21: 20 meq
  Filled 2017-03-21: qty 15

## 2017-03-21 MED ORDER — ACETAMINOPHEN 325 MG PO TABS
650.0000 mg | ORAL_TABLET | ORAL | Status: DC | PRN
  Administered 2017-03-21 – 2017-03-27 (×2): 650 mg via ORAL
  Filled 2017-03-21 (×2): qty 2

## 2017-03-21 MED ORDER — POTASSIUM CHLORIDE CRYS ER 20 MEQ PO TBCR
40.0000 meq | EXTENDED_RELEASE_TABLET | Freq: Two times a day (BID) | ORAL | Status: AC
  Administered 2017-03-21 (×2): 40 meq via ORAL
  Filled 2017-03-21 (×2): qty 2

## 2017-03-21 MED ORDER — LACTATED RINGERS IV SOLN
INTRAVENOUS | Status: DC
  Administered 2017-03-21 – 2017-03-22 (×3): via INTRAVENOUS
  Administered 2017-03-23: 100 mL/h via INTRAVENOUS
  Administered 2017-03-23: 06:00:00 via INTRAVENOUS
  Administered 2017-03-24: 1000 mL via INTRAVENOUS
  Administered 2017-03-25 – 2017-03-27 (×7): via INTRAVENOUS

## 2017-03-21 NOTE — Progress Notes (Signed)
Pt arrived on unit from 2M03 at 1550. VSS, pt oriented to room and sitting in chair. Call bell within reach, will continue to monitor.

## 2017-03-21 NOTE — Procedures (Signed)
Extubation Procedure Note  Patient Details:   Name: [name] DOB: 08/19/1965 MRN: [id number]   Airway Documentation:     Evaluation  O2 sats: stable throughout Complications: No apparent complications Patient did tolerate procedure well. Bilateral Breath Sounds: Diminished, Clear   Yes pt able to vocalize.   Pt extubated to 2L [place] per MD order. Pt able to breathe around deflated cuff. No stridor noted at this time. Pt has strong, adequate cough. IS performed 641mL x 10.    Irineo Axon [facility] 03/21/2017, 9:06 AM

## 2017-03-21 NOTE — Progress Notes (Signed)
SLP Cancellation Note  Patient Details Name: [name] MRN: [id number] DOB: August 30, 1965   Cancelled treatment:       Reason Eval/Treat Not Completed: Medical issues which prohibited therapy (On Vent. Will f/u 8/30. )  Gabriel Rainwater [place], CCC-SLP [phone]  [name] [name] 03/21/2017, 8:25 AM

## 2017-03-21 NOTE — Progress Notes (Signed)
Pts daughter at [facility] and concerned that Pt is not at baseline mental status. States "he sounds like my bipolar mother". Daughter states patient is usually level headed. Daughter is concerned and needed reassurement and support that Pt is getting better and mental status will improve

## 2017-03-21 NOTE — Progress Notes (Signed)
Pt's triple lumen IJ has some issues, upon assessment. One lumen is not capped, one lumen is infusing LR, and the third lumen still has pressure monitor attached. MD notified and DC order placed. IV team has been consulted. Awaiting DC of IJ.

## 2017-03-21 NOTE — Evaluation (Signed)
Physical Therapy Evaluation Patient Details Name: [name] MRN: [id number] DOB: 27-Jul-1965 Today's Date: 03/21/2017   History of Present Illness  Pt adm after drug overdose. Intubated on admission and extubated 03/21/17. PMH - polysubstance abuse, vocal cord cancer, and chronic pain.  Clinical Impression  Pt admitted with above diagnosis and presents to PT with functional limitations due to deficits listed below (See PT problem list). Pt needs skilled PT to maximize independence and safety to allow discharge to home with girlfriend. Expect pt will make rapid progress with mobility.     Follow Up Recommendations No PT follow up    Equipment Recommendations  None recommended by PT    Recommendations for Other Services       Precautions / Restrictions Precautions Precautions: Fall Restrictions Weight Bearing Restrictions: No      Mobility  Bed Mobility Overal bed mobility: Needs Assistance Bed Mobility: Supine to Sit     Supine to sit: Supervision     General bed mobility comments: Incr time  Transfers Overall transfer level: Needs assistance Equipment used: None Transfers: Sit to/from Stand Sit to Stand: Min guard         General transfer comment: Assist for safety and balance  Ambulation/Gait Ambulation/Gait assistance: Min assist;+2 safety/equipment Ambulation Distance (Feet): 160 Feet Assistive device: 1 person hand held assist Gait Pattern/deviations: Step-through pattern;Decreased stride length Gait velocity: decr Gait velocity interpretation: Below normal speed for age/gender General Gait Details: Unsteady gait requiring min assist for balance  Stairs            Wheelchair Mobility    Modified Rankin (Stroke Patients Only)       Balance Overall balance assessment: Needs assistance Sitting-balance support: No upper extremity supported;Feet supported Sitting balance-Leahy Scale: Good     Standing balance support: No upper extremity  supported Standing balance-Leahy Scale: Fair                               Pertinent Vitals/Pain Pain Assessment: No/denies pain    Home Living Family/patient expects to be discharged to:: Private residence Living Arrangements: Spouse/significant other Available Help at Discharge: Family;Available 24 hours/day Type of Home: House Home Access: Stairs to enter Entrance Stairs-Rails: Right Entrance Stairs-Number of Steps: 2 Home Layout: One level Home Equipment: None      Prior Function Level of Independence: Independent               Hand Dominance        Extremity/Trunk Assessment   Upper Extremity Assessment Upper Extremity Assessment: Overall WFL for tasks assessed    Lower Extremity Assessment Lower Extremity Assessment: Generalized weakness       Communication   Communication: Other (comment) (voice hoarse)  Cognition Arousal/Alertness: Awake/alert Behavior During Therapy: WFL for tasks assessed/performed Overall Cognitive Status: Impaired/Different from baseline Area of Impairment: Problem solving                             Problem Solving: Slow processing        General Comments      Exercises     Assessment/Plan    PT Assessment Patient needs continued PT services  PT Problem List Decreased strength;Decreased balance;Decreased mobility       PT Treatment Interventions Gait training;Functional mobility training;Therapeutic activities;Therapeutic exercise;Balance training;Patient/family education;DME instruction    PT Goals (Current goals can be found in the Care Plan  section)  Acute Rehab PT Goals Patient Stated Goal: Not stated PT Goal Formulation: With patient Time For Goal Achievement: 03/28/17 Potential to Achieve Goals: Good    Frequency Min 3X/week   Barriers to discharge        Co-evaluation               AM-PAC PT "6 Clicks" Daily Activity  Outcome Measure Difficulty turning over in bed  (including adjusting bedclothes, sheets and blankets)?: None Difficulty moving from lying on back to sitting on the side of the bed? : A Little Difficulty sitting down on and standing up from a chair with arms (e.g., wheelchair, bedside commode, etc,.)?: A Little Help needed moving to and from a bed to chair (including a wheelchair)?: A Little Help needed walking in hospital room?: A Little Help needed climbing 3-5 steps with a railing? : A Little 6 Click Score: 19    End of Session Equipment Utilized During Treatment: Gait belt Activity Tolerance: Patient tolerated treatment well Patient left: in chair;with call bell/phone within reach;with chair alarm set Nurse Communication: Mobility status PT Visit Diagnosis: Unsteadiness on feet (R26.81);Muscle weakness (generalized) (M62.81)    Time: 5465-0354 PT Time Calculation (min) (ACUTE ONLY): 21 min   Charges:   PT Evaluation $PT Eval Moderate Complexity: 1 Mod     PT G CodesMarland Kitchen        [facility] [place] 03/21/2017, 2:36 PM

## 2017-03-21 NOTE — Progress Notes (Signed)
Subjective: Patient awake, alert, extubated  Exam: Vitals:   03/21/17 0800 03/21/17 0900  BP: 94/72 101/65  Pulse: 73 69  Resp: 12 12  Temp: 99.2 F (37.3 C)   SpO2: 92% 94%   Gen: In bed, NAD Resp: non-labored breathing, no acute distress Abd: soft, nt  Neuro: MS: awake, alert, oriented to month, place, year PY:KDXIP, VFF Motor: good strength bilaterally  Impression: 51 yo M with overdose. With his improvement, will cancel MRI.   Recommendations: 1)No further recommendations at this time. Please call with further questions or concerns.   Roland Rack, MD [facility] [phone]  If 7pm- 7am, please page neurology on call as listed in [place].

## 2017-03-21 NOTE — Progress Notes (Signed)
   Rn requesting restraint renewal  Plan - 1h restraint oprder given  Dr. Brand Males, M.D., [facility].C.P Pulmonary and Critical Care Medicine Staff Physician [place] [facility] Pager: [phone], If no answer or between  15:00h - 7:00h: call [phone]  03/21/2017 6:56 AM

## 2017-03-21 NOTE — Progress Notes (Signed)
[facility] ADULT ICU REPLACEMENT PROTOCOL FOR AM LAB REPLACEMENT ONLY  The patient does apply for the [facility] Adult ICU Electrolyte Replacment Protocol based on the criteria listed below:   1. Is GFR >/= 40 ml/min? Yes.    Patient's GFR today is >60 2. Is urine output >/= 0.5 ml/kg/hr for the last 6 hours? Yes.   Patient's UOP is 0.54 ml/kg/hr 3. Is BUN < 60 mg/dL? Yes.    Patient's BUN today is 10 4. Abnormal electrolyte(s): 3.4 5. Ordered repletion with: per protocol 6. If a panic level lab has been reported, has the CCM MD in charge been notified? Yes.  .   Physician:  Dr. Delene Loll, [place] 03/21/2017 5:28 AM

## 2017-03-21 NOTE — Progress Notes (Signed)
Pt is alert and oriented x4, but is having mild hallucinations/paranoia, thinks that the lift is a camera and that people are watching him.

## 2017-03-21 NOTE — Progress Notes (Addendum)
Patient has not voided since foley catheter was removed at 4pm in ICU. Bladder scanned patient at 2330 and scanned 23mL of urine in the bladder. Simond,MD from [facility] paged. Simond,MD returned page and informed this RN that 228mL of urine was not enough to catherize so just bladder scan him again in 6 hours and let him know if patient is retaining the urine at that time. Patient reports that he does not have to urge to void right now. MD stated let him know if patient does not void at all. Will continue to monitor and treat per MD orders.

## 2017-03-21 NOTE — Progress Notes (Signed)
PULMONARY / CRITICAL CARE MEDICINE   Name: [name] MRN: [id number] DOB: 06/13/1966    ADMISSION DATE:  03/19/2017 CONSULTATION DATE:  03/19/2017  REFERRING MD:  Dr. Mesner  CHIEF COMPLAINT:  Unresponsive  HISTORY OF PRESENT ILLNESS:  HPI obtained from medical chart review as patient is currently intubated and sedated.  51 year old male with PMH as below significant for but not limited to hypothyroidism, dysphagia, ? 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 Admitted w/ working dx of presumed heroine overdose and shock  SUBJECTIVE:  Awake, passed SBT  VITAL SIGNS: BP 94/72   Pulse 73   Temp 98.3 F (36.8 C) (Axillary)   Resp 12   Ht 6' (1.829 m)   Wt 162 lb 4.1 oz (73.6 kg)   SpO2 92%   BMI 22.01 kg/m   HEMODYNAMICS: CVP:  [5 mmHg-7 mmHg] 7 mmHg  VENTILATOR SETTINGS: Vent Mode: CPAP;PSV FiO2 (%):  [30 %-40 %] 30 % Set Rate:  [18 bmp] 18 bmp Vt Set:  [620 mL] 620 mL PEEP:  [5 cmH20] 5 cmH20 Pressure Support:  [5 cmH20] 5 cmH20 Plateau Pressure:  [10 cmH20-18 cmH20] 11 cmH20  INTAKE / OUTPUT:  Intake/Output Summary (Last 24 hours) at 03/21/17 0825 Last data filed at 03/21/17 0800  Gross per 24 hour  Intake           3572.4 ml  Output             1525 ml  Net           2047.4 ml     PHYSICAL EXAMINATION: General appearance:  51 Year old  Male/NAD, currently attempts to be conversant -> follows commands Eyes: anicteric sclerae, moist conjunctivae; PERRL, EOMI bilaterally. Mouth:  membranes and no mucosal ulcerations; normal hard and soft palate, orally intubated Neck: Trachea midline; neck supple, no JVD Lungs/chest: CTA, with normal respiratory effort and no intercostal retractions excellent F/Vt on PSV CV: RRR, no MRGs  Abdomen: Soft, non-tender; no masses or HSM Extremities: trace peripheral edema l Skin: Normal temperature, turgor  and texture; no rash, ulcers or subcutaneous nodules Psych: Appropriate affect, alert and oriented to person, place and time   LABS:  BMET  Recent Labs Lab 03/19/17 1752 03/20/17 0230 03/21/17 0409  NA 138 143 143  K 4.0 3.4* 3.4*  CL 110 115* 115*  CO2 21* 24 23  BUN 16 13 10  CREATININE 1.43* 1.17 1.26*  GLUCOSE 169* 152* 138*   Electrolytes  Recent Labs Lab 03/19/17 1752 03/20/17 0230 03/21/17 0409  CALCIUM 6.9* 7.1* 7.0*  MG 1.6* 2.0 1.7  PHOS 2.9 1.7* 2.7    CBC  Recent Labs Lab 03/19/17 1752 03/20/17 0230 03/21/17 0409  WBC 5.4 6.7 9.4  HGB 13.3 13.0 12.4*  HCT 40.7 38.5* 37.7*  PLT 147* 156 169    Coag's  Recent Labs Lab 03/19/17 1752  APTT 31  INR 1.10    Sepsis Markers  Recent Labs Lab 03/19/17 1343 03/19/17 1752 03/20/17 0230 03/21/17 0409  LATICACIDVEN 1.7  --   --   --   PROCALCITON  --  <0.10 <0.10 <0.10    ABG  Recent Labs Lab 03/20/17 0220 03/21/17 0425  PHART 7.451* 7.475*  PCO2ART 31.1* 28.6*  PO2ART 86.2 80.6*    Liver Enzymes  Recent Labs Lab 03/19/17 1343 03/19/17 1752  AST   48* 46*  ALT 25 25  ALKPHOS 23* 25*  BILITOT 0.7 0.7  ALBUMIN 2.4* 2.6*    Cardiac Enzymes No results for input(s): TROPONINI, PROBNP in the last 168 hours.  Glucose  Recent Labs Lab 03/20/17 1130 03/20/17 1511 03/20/17 2118 03/20/17 2327 03/21/17 0405 03/21/17 0734  GLUCAP 114* 120* 117* 109* 141* 119*    Imaging Dg Chest Port 1 View  Result Date: 03/21/2017 CLINICAL DATA:  Shortness of breath. EXAM: PORTABLE CHEST 1 VIEW COMPARISON:  03/19/2017. FINDINGS: Endotracheal tube and NG tube noted in stable position. Right IJ line stable position. Right base atelectasis/infiltrate, improved from prior exam. No pleural effusion or pneumothorax . IMPRESSION: 1. Lines and tubes in stable position. 2.  Right base atelectasis/infiltrate, improved from prior exam. Electronically Signed   By: Thomas  Register   On: 03/21/2017  06:50   Dg Abd Portable 1v  Result Date: 03/20/2017 CLINICAL DATA:  Encounter for orogastric (OG) tube placement EXAM: PORTABLE ABDOMEN - 1 VIEW COMPARISON:  None. FINDINGS: Nasal/ orogastric tube passes below the diaphragm into the proximal stomach. There is no bowel dilation to suggest bowel obstruction. Lung bases are clear. IMPRESSION: Nasal/ orogastric tube tip projects in the proximal stomach. Electronically Signed   By: David  Ormond M.D.   On: 03/20/2017 20:12   STUDIES:  8/27 CT head >> neg for acute, mild sinus disease 8/27 CTA head/ neck >> 1. Patent carotid and vertebral arteries. No dissection, aneurysm, or hemodynamically significant stenosis utilizing NASCET criteria.2. Patent circle of Willis. No large vessel occlusion, aneurysm, or significant stenosis. 3. Paraseptal and centrilobular emphysema of lung apices. Mild interstitial pulmonary edema. ECHO 8/28: ef nml 55-60%, no WM abnormality   CULTURES: 8/27 BCx 2 >> 8/27 UC >>neg  ANTIBIOTICS: none  SIGNIFICANT EVENTS: 8/27 Admit  LINES/TUBES: 8/27 ETT >> 8/27 R TL CVC >>  DISCUSSION: 51 year old male with history of vocal cord cancer that supposedly he is getting treatment for but took himself off chemo 3 wks ago who used benzos, THC, narcotic and cocaine (which he denies) and was found unresponsive & in shock.  Now awake Passed SBT Weaning pressors.  ECHO nml -extubate -wean oxygen -assess swallow -out of ICU and to medicine later today IF no issues.   ASSESSMENT / PLAN:  PULMONARY A: VDRF due to inability to protect his airway. RLL ATX ->Now fully awake PCXR personally reviewed: improved right basilar atelectasis.  ->ABG w/ resp alk.  ->passed SBT P:   Extubate Wean Oxygen  Mobilize IS Asp precautions   CARDIOVASCULAR A:  Circulatory shock due to polypharmacy most likely, no signs of infection here ECHO: 55-60%, NML wall motion ->levophed weaning, now on ~ 4 mcgs P:  Cont IVFs (increased  rate to 100ml/hr) Wean levophed  Cont stress dose steroids  RENAL A:   AKI-->cr a little worse. Suspect that this is to some extent d/t hypovolemia still  Hypokalemia  Mild hyperchloremia  P:   Replace K LR at 100ml/hr Repeat Chem in am   GASTROINTESTINAL A:   H/o Vocal Cord cancer s/p XRT  H/o dysphagia  P:   SLP eval for swallow Adv diet as tolerated  HEMATOLOGIC A:   No active issues P:  Trend cbc [place] heparin   INFECTIOUS A:   No active issues No sig fever Wbc nml P:   Trend fever and wbc curve   ENDOCRINE A:   Relative adrenal insuff P:   Dc ssi and cbgs Stress dose steroids    NEUROLOGIC A:   Unresponsive due to drug OD ?CVA P:     FAMILY  - Updates: No family bedside to update.  My cct 70 minutes Erick Colace ACNP-BC [place] Pager # [phone] OR # [phone] if no answer  03/19/2017, 5:21 PM

## 2017-03-22 DIAGNOSIS — F19129 Other psychoactive substance abuse with intoxication, unspecified: Secondary | ICD-10-CM

## 2017-03-22 DIAGNOSIS — F1390 Sedative, hypnotic, or anxiolytic use, unspecified, uncomplicated: Secondary | ICD-10-CM

## 2017-03-22 DIAGNOSIS — F17210 Nicotine dependence, cigarettes, uncomplicated: Secondary | ICD-10-CM

## 2017-03-22 DIAGNOSIS — F11221 Opioid dependence with intoxication delirium: Secondary | ICD-10-CM | POA: Diagnosis present

## 2017-03-22 DIAGNOSIS — R4182 Altered mental status, unspecified: Secondary | ICD-10-CM

## 2017-03-22 HISTORY — DX: Opioid dependence with intoxication delirium: F11.221

## 2017-03-22 LAB — BASIC METABOLIC PANEL
Anion gap: 6 (ref 5–15)
BUN: 16 mg/dL (ref 6–20)
CHLORIDE: 114 mmol/L — AB (ref 101–111)
CO2: 22 mmol/L (ref 22–32)
CREATININE: 1.17 mg/dL (ref 0.61–1.24)
Calcium: 7.8 mg/dL — ABNORMAL LOW (ref 8.9–10.3)
GFR calc Af Amer: >60 mL/min (ref >60)
GFR calc non Af Amer: >60 mL/min (ref >60)
Glucose, Bld: 126 mg/dL — ABNORMAL HIGH (ref 65–99)
POTASSIUM: 3.8 mmol/L (ref 3.5–5.1)
SODIUM: 142 mmol/L (ref 135–145)

## 2017-03-22 MED ORDER — LORAZEPAM 2 MG/ML IJ SOLN
2.0000 mg | INTRAMUSCULAR | Status: DC | PRN
  Administered 2017-03-22 – 2017-03-26 (×9): 2 mg via INTRAVENOUS
  Filled 2017-03-22 (×10): qty 1

## 2017-03-22 MED ORDER — METHOCARBAMOL 500 MG PO TABS
500.0000 mg | ORAL_TABLET | Freq: Three times a day (TID) | ORAL | Status: AC | PRN
  Filled 2017-03-22: qty 1

## 2017-03-22 MED ORDER — THIAMINE HCL 100 MG/ML IJ SOLN: 100.0000 mg | Freq: Every day | INTRAMUSCULAR | Status: DC

## 2017-03-22 MED ORDER — ADULT MULTIVITAMIN W/MINERALS CH
1.0000 | ORAL_TABLET | Freq: Every day | ORAL | Status: DC
  Administered 2017-03-22 – 2017-03-27 (×6): 1 via ORAL
  Filled 2017-03-22 (×6): qty 1

## 2017-03-22 MED ORDER — LORAZEPAM 2 MG/ML IJ SOLN
2.0000 mg | INTRAMUSCULAR | Status: AC
  Administered 2017-03-22: 2 mg via INTRAVENOUS

## 2017-03-22 MED ORDER — NAPROXEN 250 MG PO TABS
500.0000 mg | ORAL_TABLET | Freq: Two times a day (BID) | ORAL | Status: AC | PRN
  Administered 2017-03-24 – 2017-03-25 (×2): 500 mg via ORAL
  Filled 2017-03-22 (×4): qty 1

## 2017-03-22 MED ORDER — LORAZEPAM 2 MG/ML IJ SOLN
INTRAMUSCULAR | Status: AC
Start: 2017-03-22 — End: 2017-03-22
  Filled 2017-03-22: qty 1

## 2017-03-22 MED ORDER — HALOPERIDOL 1 MG PO TABS
2.0000 mg | ORAL_TABLET | Freq: Once | ORAL | Status: AC
  Administered 2017-03-22: 2 mg via ORAL
  Filled 2017-03-22: qty 2

## 2017-03-22 MED ORDER — LOPERAMIDE HCL 2 MG PO CAPS: 2.0000 mg | ORAL_CAPSULE | ORAL | Status: AC | PRN

## 2017-03-22 MED ORDER — HALOPERIDOL LACTATE 5 MG/ML IJ SOLN: 2.0000 mg | Freq: Four times a day (QID) | INTRAMUSCULAR | Status: DC | PRN

## 2017-03-22 MED ORDER — FOLIC ACID 1 MG PO TABS
1.0000 mg | ORAL_TABLET | Freq: Every day | ORAL | Status: DC
  Administered 2017-03-22 – 2017-03-27 (×6): 1 mg via ORAL
  Filled 2017-03-22 (×6): qty 1

## 2017-03-22 MED ORDER — LORAZEPAM 2 MG/ML IJ SOLN
2.0000 mg | Freq: Once | INTRAMUSCULAR | Status: AC
  Administered 2017-03-22: 2 mg via INTRAVENOUS

## 2017-03-22 MED ORDER — ONDANSETRON 4 MG PO TBDP: 4.0000 mg | ORAL_TABLET | Freq: Four times a day (QID) | ORAL | Status: AC | PRN

## 2017-03-22 MED ORDER — LORAZEPAM 0.5 MG PO TABS
0.5000 mg | ORAL_TABLET | Freq: Four times a day (QID) | ORAL | Status: DC | PRN
  Administered 2017-03-23 – 2017-03-27 (×6): 0.5 mg via ORAL
  Filled 2017-03-22 (×6): qty 1

## 2017-03-22 MED ORDER — LORAZEPAM 2 MG/ML IJ SOLN: 1.0000 mg | Freq: Four times a day (QID) | INTRAMUSCULAR | Status: DC | PRN

## 2017-03-22 MED ORDER — PANTOPRAZOLE SODIUM 40 MG PO TBEC
40.0000 mg | DELAYED_RELEASE_TABLET | Freq: Every day | ORAL | Status: DC
  Administered 2017-03-22 – 2017-03-27 (×6): 40 mg via ORAL
  Filled 2017-03-22 (×6): qty 1

## 2017-03-22 MED ORDER — LORAZEPAM 2 MG/ML IJ SOLN
INTRAMUSCULAR | Status: AC
  Filled 2017-03-22: qty 1

## 2017-03-22 MED ORDER — LORAZEPAM 2 MG/ML IJ SOLN
2.0000 mg | Freq: Once | INTRAMUSCULAR | Status: AC
  Administered 2017-03-22: 2 mg via INTRAMUSCULAR

## 2017-03-22 MED ORDER — HYDROXYZINE HCL 25 MG PO TABS
25.0000 mg | ORAL_TABLET | Freq: Four times a day (QID) | ORAL | Status: AC | PRN
  Administered 2017-03-26: 25 mg via ORAL
  Filled 2017-03-22: qty 1

## 2017-03-22 MED ORDER — DICYCLOMINE HCL 20 MG PO TABS: 20.0000 mg | ORAL_TABLET | Freq: Four times a day (QID) | ORAL | Status: AC | PRN

## 2017-03-22 MED ORDER — DIPHENHYDRAMINE HCL 25 MG PO CAPS
50.0000 mg | ORAL_CAPSULE | Freq: Four times a day (QID) | ORAL | Status: DC | PRN
  Administered 2017-03-22 – 2017-03-27 (×4): 50 mg via ORAL
  Filled 2017-03-22 (×5): qty 2

## 2017-03-22 MED ORDER — HALOPERIDOL 0.5 MG PO TABS
2.0000 mg | ORAL_TABLET | Freq: Four times a day (QID) | ORAL | Status: DC | PRN
  Filled 2017-03-22: qty 1

## 2017-03-22 MED ORDER — LORAZEPAM 2 MG/ML IJ SOLN
INTRAMUSCULAR | Status: AC
Start: 2017-03-22 — End: 2017-03-22
  Administered 2017-03-22: 2 mg via INTRAVENOUS
  Filled 2017-03-22: qty 1

## 2017-03-22 MED ORDER — LORAZEPAM 1 MG PO TABS: 1.0000 mg | ORAL_TABLET | Freq: Four times a day (QID) | ORAL | Status: DC | PRN

## 2017-03-22 MED ORDER — VITAMIN B-1 100 MG PO TABS
100.0000 mg | ORAL_TABLET | Freq: Every day | ORAL | Status: DC
  Administered 2017-03-22 – 2017-03-27 (×6): 100 mg via ORAL
  Filled 2017-03-22 (×6): qty 1

## 2017-03-22 MED ORDER — LORAZEPAM 1 MG PO TABS
1.0000 mg | ORAL_TABLET | Freq: Four times a day (QID) | ORAL | Status: DC | PRN
Start: 2017-03-22 — End: 2017-03-22
  Administered 2017-03-22: 1 mg via ORAL
  Filled 2017-03-22: qty 1

## 2017-03-22 MED ORDER — HALOPERIDOL 2 MG PO TABS
2.0000 mg | ORAL_TABLET | Freq: Once | ORAL | Status: DC
  Filled 2017-03-22 (×2): qty 1

## 2017-03-22 NOTE — Progress Notes (Signed)
Nutrition Follow-up  DOCUMENTATION CODES:   Not applicable  INTERVENTION:   -Ensure Enlive po BID, each supplement provides 350 kcal and 20 grams of protein -Continue MVI daily  NUTRITION DIAGNOSIS:   Inadequate oral intake related to poor appetite as evidenced by meal completion < 50%.  Progressing  GOAL:   Patient will meet greater than or equal to 90% of their needs  Progressing  MONITOR:   PO intake, Supplement acceptance, Labs, Weight trends, Skin, I & O's  REASON FOR ASSESSMENT:   Consult Enteral/tube feeding initiation and management  ASSESSMENT:   51 yo male with PMH of hypothyroidism, dysphagia, chronic pain, vocal cord cancer s/p SRT 2014 who was brought to the ED on 8/27 after being found unresponsive at home after injecting Heroin earlier in the day. Required intubation on admission.  8/28- extubated, transferred from ICU to medical floor 8/30- s/p BSE, recommend continue regular consistency diet with thin liquids  Spoke with pt, who reports fair appetite. He estimates he consumed 50% of his dinner last night, however, did not eat breakfast this morning ("I just wasn't feeling it"). Documented meal completion 40%. He reports fair appetite PTA- consumes 3 meals per day (Breakfast: bacon, eggs, toast, Lunch: burger and fries, Dinner: meat, starch, vegetable). His roommate prepares his meals. He is looking forward to his burger that he ordered for lunch.  Pt denies any weight loss PTA.   Discussed with pt importance of good meal intake to promote healing. Due to variable meal completion, RD will order supplement to optimize nutritional intake.   Labs reviewed: CBGS: 119-135.   Diet Order:  Diet Heart Room service appropriate? Yes with Assist; Fluid consistency: Thin  Skin:  Reviewed, no issues  Last BM:  PTA  Height:   Ht Readings from Last 1 Encounters:  03/19/17 6' (1.829 m)    Weight:   Wt Readings from Last 1 Encounters:  03/22/17 162 lb 8 oz  (73.7 kg)    Ideal Body Weight:  80.9 kg  BMI:  Body mass index is 22.04 kg/m.  Estimated Nutritional Needs:   Kcal:  1850-2050  Protein:  95-100 grams  Fluid:  1.8-2.0 L  EDUCATION NEEDS:   Education needs addressed  Anniece Bleiler A. Jimmye Norman, RD, LDN, CDE Pager: [phone] After hours Pager: [phone]

## 2017-03-22 NOTE — Evaluation (Signed)
Clinical/Bedside Swallow Evaluation Patient Details  Name: [name] MRN: [id number] Date of Birth: 04/26/1966  Today's Date: 03/22/2017 Time: SLP Start Time (ACUTE ONLY): 9518 SLP Stop Time (ACUTE ONLY): 0930 SLP Time Calculation (min) (ACUTE ONLY): 14 min  Past Medical History:  Past Medical History:  Diagnosis Date  . Headache(784.0)   . Heart murmur    child  . Hx of radiation therapy 05/19/13- 06/27/13   glottic larynx, right true vocal cord 6300 cGy 28 sessions  . Hypothyroidism   . Shortness of breath   . Squamous cell carcinoma of vocal cord ([place]) 09/11/2013  . Vocal cord cancer ([place]) 05/01/2013   Past Surgical History:  Past Surgical History:  Procedure Laterality Date  . BALLOON DILATION N/A 12/30/2013   Procedure: BALLOON DILATION;  Surgeon: Garlan Fair, MD;  Location: [facility];  Service: Endoscopy;  Laterality: N/A;  . DIRECT LARYNGOSCOPY Right 04/24/2013   Procedure: DIRECT LARYNGOSCOPY and BIOPSY OF TONGUE;  Surgeon: Jerrell Belfast, MD;  Location: [facility];  Service: ENT;  Laterality: Right;  . ESOPHAGEAL MANOMETRY N/A 09/07/2014   Procedure: ESOPHAGEAL MANOMETRY (EM);  Surgeon: Garlan Fair, MD;  Location: [facility];  Service: Endoscopy;  Laterality: N/A;  . ESOPHAGOGASTRODUODENOSCOPY (EGD) WITH PROPOFOL N/A 12/30/2013   Procedure: ESOPHAGOGASTRODUODENOSCOPY (EGD) WITH PROPOFOL;  Surgeon: Garlan Fair, MD;  Location: [facility];  Service: Endoscopy;  Laterality: N/A;  . MICROLARYNGOSCOPY Right 04/24/2013   Procedure: MICROLARYNGOSCOPY and RIGHT VOCAL CORD BIOPSY;  Surgeon: Jerrell Belfast, MD;  Location: [facility];  Service: ENT;  Laterality: Right;  . MULTIPLE EXTRACTIONS WITH ALVEOLOPLASTY N/A 09/30/2013   Procedure: Extraction of tooth #'s 2,3,4,5,6,7,8,9,10,11,12,15,22,23,24,25,26,27,28 with alveoloplasty;  Surgeon: Lenn Cal, DDS;  Location: [facility];  Service: Oral Surgery;  Laterality: N/A;  . TONSILLECTOMY     as a child  . wisdon teeth      age 5   HPI:  Pt adm after drug overdose. Intubated on admission and extubated 03/21/17. PMH - polysubstance abuse, vocal cord cancer s/p XRT in 2014, dysphagia, and chronic pain.MBS in 2015 notes a relatively normal oropharyngeal swallow without penetration, aspiration, or stasis. Did note c/o globus consistent with esophageal dysphagia.  Esophagram complete same month noted possible lower and upper esophageal stricture. S/p dilation in 2015. Repeat MBS complete in 2017 noted again a normal oropharyngeal swallow.    Assessment / Plan / Recommendation Clinical Impression  Patient presents with what appears to be a normal oropharyngeal swallow consistent with previous 2 MBS results. No c/o globus or esophageal discomfort today. No SLP f/u indicated.  SLP Visit Diagnosis: Dysphagia, unspecified (R13.10)    Aspiration Risk  Mild aspiration risk    Diet Recommendation Regular;Thin liquid   Liquid Administration via: Cup;Straw Medication Administration: Whole meds with liquid Supervision: Patient able to self feed Compensations: Slow rate;Small sips/bites;Follow solids with liquid Postural Changes: Remain upright for at least 30 minutes after po intake;Seated upright at 90 degrees    Other  Recommendations Oral Care Recommendations: Oral care BID   Follow up Recommendations None        Swallow Study   General HPI: Pt adm after drug overdose. Intubated on admission and extubated 03/21/17. PMH - polysubstance abuse, vocal cord cancer s/p XRT in 2014, dysphagia, and chronic pain.MBS in 2015 notes a relatively normal oropharyngeal swallow without penetration, aspiration, or stasis. Did note c/o globus consistent with esophageal dysphagia.  Esophagram complete same month noted possible lower and upper esophageal stricture. S/p dilation in 2015.  Repeat MBS complete in 2017 noted again a normal oropharyngeal swallow.  Type of Study: Bedside Swallow Evaluation Previous Swallow Assessment: see  HPI Diet Prior to this Study: Regular;Thin liquids Temperature Spikes Noted: No Respiratory Status: Room air History of Recent Intubation: Yes Length of Intubations (days): 2 days Date extubated: 03/21/17 Behavior/Cognition: Alert;Cooperative;Pleasant mood Oral Cavity Assessment: Within Functional Limits Oral Care Completed by SLP: No Oral Cavity - Dentition: Edentulous Vision: Functional for self-feeding Self-Feeding Abilities: Able to feed self Patient Positioning: Upright in chair Baseline Vocal Quality: Hoarse (returned to baseline s/p extubation per patient) Volitional Cough: Strong Volitional Swallow: Able to elicit    Oral/Motor/Sensory Function Overall Oral Motor/Sensory Function: Within functional limits   Ice Chips Ice chips: Not tested   Thin Liquid Thin Liquid: Within functional limits Presentation: Self Fed;Straw    Nectar Thick Nectar Thick Liquid: Not tested   Honey Thick Honey Thick Liquid: Not tested   Puree Puree: Not tested   Solid  Carmelite Violet MA, CCC-SLP [phone]    Solid: Within functional limits Presentation: Self Fed        Audreyana Huntsberry [name] 03/22/2017,9:39 AM

## 2017-03-22 NOTE — Progress Notes (Signed)
Throughout my worklist, it has asked me to document on restraints. When this patient was transferred to this unit his order for restraints had been discontinued and there were no orders for him to have restraints. I have not documented on any restraints this shift and the patient has not worn any restraints this whole shift and there is no order for restraints.

## 2017-03-22 NOTE — Progress Notes (Signed)
Physical Therapy Treatment Patient Details Name: [name] MRN: [id number] DOB: 1966-04-01 Today's Date: 03/22/2017    History of Present Illness Pt adm after drug overdose. Intubated on admission and extubated 03/21/17. PMH - polysubstance abuse, vocal cord cancer, and chronic pain.    PT Comments    Pt with decreased balance with higher level balance challenges. Pt has poor insight into these balance deficits.    Follow Up Recommendations  No PT follow up     Equipment Recommendations  None recommended by PT    Recommendations for Other Services       Precautions / Restrictions Precautions Precautions: Fall Restrictions Weight Bearing Restrictions: No    Mobility  Bed Mobility   Bed Mobility: Supine to Sit     Supine to sit: Modified independent (Device/Increase time)        Transfers   Equipment used: None Transfers: Sit to/from Stand Sit to Stand: Min guard         General transfer comment: A for steadying upon standing  Ambulation/Gait Ambulation/Gait assistance: Min assist;Min guard Ambulation Distance (Feet): 200 Feet Assistive device: None Gait Pattern/deviations: Decreased stride length;Drifts right/left Gait velocity: decr Gait velocity interpretation: Below normal speed for age/gender General Gait Details: Amb with IV pole for steadying gait for 1st half of gait.  Amb without AD 2nd half with no LOB, but more unsteady and tends to drift and light stagger   Stairs            Wheelchair Mobility    Modified Rankin (Stroke Patients Only)       Balance Overall balance assessment: Needs assistance Sitting-balance support: No upper extremity supported;Feet supported Sitting balance-Leahy Scale: Good     Standing balance support: No upper extremity supported Standing balance-Leahy Scale: Fair   Single Leg Stance - Right Leg: 4 Single Leg Stance - Left Leg: 2 Tandem Stance - Right Leg: 8         High Level Balance  Comments: Pt performed SLS, tandem stance for balance challenge.  Stood with narrow BOS and perterbations applied with no LOB.            Cognition Arousal/Alertness: Awake/alert Behavior During Therapy: WFL for tasks assessed/performed Overall Cognitive Status: Impaired/Different from baseline Area of Impairment: Problem solving                             Problem Solving: Slow processing        Exercises      General Comments        Pertinent Vitals/Pain Pain Assessment: No/denies pain    Home Living                      Prior Function            PT Goals (current goals can now be found in the care plan section) Acute Rehab PT Goals Patient Stated Goal: Not stated PT Goal Formulation: With patient Time For Goal Achievement: 03/28/17 Potential to Achieve Goals: Good Progress towards PT goals: Progressing toward goals    Frequency    Min 3X/week      PT Plan Current plan remains appropriate    Co-evaluation              AM-PAC PT "6 Clicks" Daily Activity  Outcome Measure  Difficulty turning over in bed (including adjusting bedclothes, sheets and blankets)?: None Difficulty moving from lying on back  to sitting on the side of the bed? : None Difficulty sitting down on and standing up from a chair with arms (e.g., wheelchair, bedside commode, etc,.)?: A Little Help needed moving to and from a bed to chair (including a wheelchair)?: A Little Help needed walking in hospital room?: A Little Help needed climbing 3-5 steps with a railing? : A Little 6 Click Score: 20    End of Session Equipment Utilized During Treatment: Gait belt Activity Tolerance: Patient tolerated treatment well Patient left: in chair;with call bell/phone within reach;with nursing/sitter in room Freight forwarder) Nurse Communication: Mobility status PT Visit Diagnosis: Unsteadiness on feet (R26.81);Muscle weakness (generalized) (M62.81)     Time: 1561-5379 PT  Time Calculation (min) (ACUTE ONLY): 13 min  Charges:  $Gait Training: 8-22 mins                    G Codes:       Jaliyah Fotheringham L. Tamala Julian, [place] Pager [phone] 03/22/2017    Galen Manila 03/22/2017, 11:02 AM

## 2017-03-22 NOTE — Progress Notes (Signed)
Heard commotion in the hall someone calling "Help!" Followed charge RN and Surveyor, quantity down to find patient in hallway pushing staff trying to get away from [place]. He pulled his telemetry box off and threw it in the direction of my Surveyor, quantity who was able to move out of the way. He then jerked his IV out and continued to move down the hall. He exited at the end of the hall near [place] room 1 down the stair well running out into the parking lot near the loading dock. Dr. Wendee Beavers paged and stated patient needed to be involuntarily committed at this time with his erratic behavior. Orders received and patient escorted back to the room with security assistance. He allowed [place] to start a new IV and place his heart monitor back onto him. Sitter at bedside for safety.

## 2017-03-22 NOTE — Progress Notes (Signed)
PROGRESS NOTE    [name]  ZHY:[id number] DOB: 25-May-1966 DOA: 03/19/2017 PCP: Lucious [name], DO    Brief Narrative:  Patient is a 51 y/o that presented to the hospital after heroin overdose. Required intubation and treatment by our critical care doctors. Neurology consulted by signed off as patient's symptoms were deemed secondary to overdose.  Assessment & Plan:   Active Problems:   Acute respiratory failure with hypoxemia ([place]) - resolved  OD - on heroine. Placed on clonidine order set for withdrawal symptoms - psychiatry consulted for further evaluation and recommendations.  - place on CIWA protocol - pt wanted to leave but he is still confused. I believe he is a harm to himself at this time.  - would appreciate psychiatry recommendations for medical management of heroin withdrawal if it occurs  DVT prophylaxis: Heparin Code Status: Full Family Communication: d/c family at bedside. Disposition Plan: d/c if cleared by psychiatry   Consultants:   psychiatry  Procedures:   Antimicrobials: none  Subjective: Per family patient has had confusion and is not quite himself yet.  Objective: Vitals:   03/21/17 1553 03/21/17 2308 03/22/17 0500 03/22/17 0556  BP: 103/69 119/80  119/82  Pulse: 95 72  86  Resp: 18 19  20   Temp:  97.7 F (36.5 C)  98.1 F (36.7 C)  TempSrc:  Oral  Oral  SpO2: 98% 99%    Weight:   73.7 kg (162 lb 8 oz)   Height:        Intake/Output Summary (Last 24 hours) at 03/22/17 1210 Last data filed at 03/22/17 0900  Gross per 24 hour  Intake             1990 ml  Output              600 ml  Net             1390 ml   Filed Weights   03/20/17 0242 03/21/17 0412 03/22/17 0500  Weight: 66.6 kg (146 lb 13.2 oz) 73.6 kg (162 lb 4.1 oz) 73.7 kg (162 lb 8 oz)    Examination:  General exam: Appears calm and comfortable, in nad. Sitting in chair Respiratory system: Clear to auscultation. Respiratory effort normal. Equal chest  rise. Cardiovascular system: S1 & S2 heard, RRR.  Gastrointestinal system: Abdomen is nondistended, soft and nontender. No organomegaly or masses felt. Normal bowel sounds heard. Central nervous system:  No focal neurological deficits, some confusion Extremities: Symmetric 5 x 5 power. Skin: No rashes, lesions or ulcers, on limited exam. Psychiatry:  Mood & affect appropriate. confusion    Data Reviewed: I have personally reviewed following labs and imaging studies  CBC:  Recent Labs Lab 03/19/17 1332 03/19/17 1343 03/19/17 1752 03/20/17 0230 03/21/17 0409  WBC  --  4.0 5.4 6.7 9.4  NEUTROABS  --  3.1  --   --   --   HGB 13.9 12.7* 13.3 13.0 12.4*  HCT 41.0 38.4* 40.7 38.5* 37.7*  MCV  --  96.2 96.9 95.1 95.2  PLT  --  144* 147* 156 295   Basic Metabolic Panel:  Recent Labs Lab 03/19/17 1343 03/19/17 1752 03/20/17 0230 03/21/17 0409 03/22/17 0517  NA 140 138 143 143 142  K 3.6 4.0 3.4* 3.4* 3.8  CL 110 110 115* 115* 114*  CO2 24 21* 24 23 22   GLUCOSE 93 169* 152* 138* 126*  BUN 19 16 13 10 16   CREATININE 1.48* 1.43* 1.17 1.26*  1.17  CALCIUM 7.6* 6.9* 7.1* 7.0* 7.8*  MG  --  1.6* 2.0 1.7  --   PHOS  --  2.9 1.7* 2.7  --    GFR: Estimated Creatinine Clearance: 76.1 mL/min (by C-G formula based on SCr of 1.17 mg/dL). Liver Function Tests:  Recent Labs Lab 03/19/17 1343 03/19/17 1752  AST 48* 46*  ALT 25 25  ALKPHOS 23* 25*  BILITOT 0.7 0.7  PROT 3.8* 4.1*  ALBUMIN 2.4* 2.6*    Recent Labs Lab 03/19/17 1752  LIPASE 23  AMYLASE 20*    Recent Labs Lab 03/19/17 1343  AMMONIA 51*   Coagulation Profile:  Recent Labs Lab 03/19/17 1752  INR 1.10   Cardiac Enzymes: No results for input(s): CKTOTAL, CKMB, CKMBINDEX, TROPONINI in the last 168 hours. BNP (last 3 results) No results for input(s): PROBNP in the last 8760 hours. HbA1C: No results for input(s): HGBA1C in the last 72 hours. CBG:  Recent Labs Lab 03/20/17 2118 03/20/17 2327  03/21/17 0405 03/21/17 0734 03/21/17 1125  GLUCAP 117* 109* 141* 119* 135*   Lipid Profile: No results for input(s): CHOL, HDL, LDLCALC, TRIG, CHOLHDL, LDLDIRECT in the last 72 hours. Thyroid Function Tests:  Recent Labs  03/19/17 1752 03/20/17 1000  TSH 55.749*  --   FREET4  --  0.25*   Anemia Panel: No results for input(s): VITAMINB12, FOLATE, FERRITIN, TIBC, IRON, RETICCTPCT in the last 72 hours. Sepsis Labs:  Recent Labs Lab 03/19/17 1343 03/19/17 1752 03/20/17 0230 03/21/17 0409  PROCALCITON  --  <0.10 <0.10 <0.10  LATICACIDVEN 1.7  --   --   --     Recent Results (from the past 240 hour(s))  Blood Cultures (routine x 2)     Status: None (Preliminary result)   Collection Time: 03/19/17  1:42 PM  Result Value Ref Range Status   Specimen Description BLOOD LEFT HAND  Final   Special Requests   Final    BOTTLES DRAWN AEROBIC AND ANAEROBIC Blood Culture adequate volume   Culture NO GROWTH 3 DAYS  Final   Report Status PENDING  Incomplete  Urine culture     Status: None   Collection Time: 03/19/17  2:19 PM  Result Value Ref Range Status   Specimen Description URINE, RANDOM  Final   Special Requests NONE  Final   Culture NO GROWTH  Final   Report Status 03/20/2017 FINAL  Final  Blood Cultures (routine x 2)     Status: None (Preliminary result)   Collection Time: 03/19/17  2:20 PM  Result Value Ref Range Status   Specimen Description BLOOD LEFT ARM  Final   Special Requests   Final    BOTTLES DRAWN AEROBIC AND ANAEROBIC Blood Culture adequate volume   Culture NO GROWTH 3 DAYS  Final   Report Status PENDING  Incomplete  MRSA PCR Screening     Status: None   Collection Time: 03/19/17  8:04 PM  Result Value Ref Range Status   MRSA by PCR NEGATIVE NEGATIVE Final    Comment:        The GeneXpert MRSA Assay (FDA approved for NASAL specimens only), is one component of a comprehensive MRSA colonization surveillance program. It is not intended to diagnose  MRSA infection nor to guide or monitor treatment for MRSA infections.      Radiology Studies: Dg Chest Port 1 View  Result Date: 03/21/2017 CLINICAL DATA:  Shortness of breath. EXAM: PORTABLE CHEST 1 VIEW COMPARISON:  03/19/2017. FINDINGS: Endotracheal  tube and NG tube noted in stable position. Right IJ line stable position. Right base atelectasis/infiltrate, improved from prior exam. No pleural effusion or pneumothorax . IMPRESSION: 1. Lines and tubes in stable position. 2.  Right base atelectasis/infiltrate, improved from prior exam. Electronically Signed   By: Marcello Moores  Register   On: 03/21/2017 06:50   Dg Abd Portable 1v  Result Date: 03/20/2017 CLINICAL DATA:  Encounter for orogastric (OG) tube placement EXAM: PORTABLE ABDOMEN - 1 VIEW COMPARISON:  None. FINDINGS: Nasal/ orogastric tube passes below the diaphragm into the proximal stomach. There is no bowel dilation to suggest bowel obstruction. Lung bases are clear. IMPRESSION: Nasal/ orogastric tube tip projects in the proximal stomach. Electronically Signed   By: Lajean Manes M.D.   On: 03/20/2017 20:12    Scheduled Meds: . folic acid  1 mg Oral Daily  . heparin  5,000 Units Subcutaneous Q8H  . hydrocortisone sodium succinate  50 mg Intravenous Q6H  . multivitamin with minerals  1 tablet Oral Daily  . pantoprazole  40 mg Oral Daily  . thiamine  100 mg Oral Daily   Continuous Infusions: . albuterol 2.5 mg/hr (03/19/17 1834)  . lactated ringers 100 mL/hr at 03/22/17 0453     LOS: 3 days    Time spent: > 35 minutes  Velvet Bathe, MD [facility] Pager [phone]  If 7PM-7AM, please contact night-coverage [email or web address] Password [email or web address] 03/22/2017, 12:10 PM

## 2017-03-22 NOTE — Progress Notes (Signed)
This nurse was in the room while dr j completed his assessment and pt stated " I want to go to rehab and I want help" social work consult was ordered. Will ctm

## 2017-03-22 NOTE — Significant Event (Addendum)
Rapid Response Event Note  Overview: Time Called: 1930 Arrival Time: 1932 Event Type: Other (Comment)  Initial Focused Assessment: Called by RNs to evaluate patient for CIWA score of 25.  Per RNs, patient has been very agitated and aggressive, already in four point restraints, RNs had no PRN CIWA medications ordered, currently patient has had any medications since around 1500.  Upon arrival patient was extremely agitated, + hallucinations, + tremors, + verbally aggressive, + thrashing in the bed.  I paged the MD, spoke with Dr. Myna Hidalgo, we discussed the patient's current presentation. Vital signs are stable, sitter at the bedside, speech incoherent at times, + IVC. UDS on admission + opiates, cocaine, benzodiazepines, THC, and subjectively heroin.   Interventions: - Order for Ativan 2mg  IV now, given at 1943 - Will transfer patient to SDU level of care for SDU CIWA scale, which will allow more medication coverage if needed per patient's presentation. - CIWA still 26 after Ativan 2mg  IV, I spoke with Dr. Myna Hidalgo again, will give an additional 2mg  now, given at 2000. - Total 4mg  Ativan IV, VS prior to and after administration remain stable. - I inserted a second IV.  Plan of Care (if not transferred): - Awaiting SDU bed. - Patient is tenuous in current state of withdrawal, please call RR RN if needed.   Event Summary: Name of Physician Notified: Dr. Myna Hidalgo at [phone]    at    Outcome: Transferred (Comment)  Event End Time: 2100  Netty Starring

## 2017-03-22 NOTE — Consult Note (Signed)
[facility] Face-to-Face Psychiatry Consult   Reason for Consult:  Capacity evaluation Referring Physician:  Dr. Vega Patient Identification: [name] MRN:  [id number] Principal Diagnosis: Polysubstance dependence including opioid type drug, episodic abuse, with delirium (HCC) Diagnosis:   Patient Active Problem List   Diagnosis Date Noted  . Shock (HCC) [R57.9]   . Unresponsive [R41.89]   . Acute respiratory failure with hypoxemia (HCC) [J96.01] 03/19/2017  . Homelessness [Z59.0] 10/25/2016  . Long term (current) use of opiate analgesic [Z79.891] 10/11/2016  . Emphysema of lung (HCC) [J43.9] 12/18/2014  . Esophageal dysphagia [R13.10] 09/10/2014  . Tobacco use disorder [F17.200] 09/10/2014  . Routine health maintenance [Z00.00] 07/09/2014  . 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 GERD (gastroesophageal reflux disease) [K21.9] 06/12/2014  . Hx of radiation therapy [Z92.3]   . 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 Hoarseness of voice [R49.0] 03/17/2013    Total Time spent with patient: 1 hour  Subjective:   [name] is a 51 y.o. male patient admitted with AMS and polysubstance abuse with intoxication.  HPI: [name] is a 51 years old male, seen, chart reviewed and case discussed with staff RN, patient ex-wife and daughter were at bedside with patient consent for this face-to-face psychiatric consultation and evaluation. Patient admitted to the hospital with altered mental status since then his mental status has been improved. Potentially patient required intubation for airway protection on admission. Patient was hallucinating as for the staff and family members. Reportedly patient has been suffering with polysubstance abuse including heroin Patient presented with altered mental status and came to the emergency department August 27 after being found unresponsive at home. Patient urine drug  screen is positive for multiple drugs including opiates, cocaine, benzodiazepines and tetrahydrocannabinol. Patient endorses using/abusing on the illicit drugs. Patient reportedly working for construction and whenever he has no drug use them. Patient told his daughter who would like to go to detox and rehabilitation treatment after this hospitalization. Patient denies symptoms of depression, anxiety, psychosis, suicidal and homicidal ideation, intention or plans.  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 Patient has no substance abuse detox treatment so rehabilitation treatments.  Risk to Self: Is patient at risk for suicide?: No Risk to Others:   Prior Inpatient Therapy:   Prior Outpatient Therapy:    Past Medical History:  Past Medical History:  Diagnosis Date  . Headache(784.0)   . Heart murmur    child  . Hx of radiation therapy 05/19/13- 06/27/13   glottic larynx, right true vocal cord 6300 cGy 28 sessions  . Hypothyroidism   . Shortness of breath   . 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 Vocal cord cancer (HCC) 05/01/2013    Past Surgical History:  Procedure Laterality Date  . BALLOON DILATION N/A 12/30/2013   Procedure: BALLOON DILATION;  Surgeon: Martin K Johnson, MD;  Location: [facility];  Service: Endoscopy;  Laterality: N/A;  . DIRECT LARYNGOSCOPY Right 04/24/2013   Procedure: DIRECT LARYNGOSCOPY and BIOPSY OF TONGUE;  Surgeon: [name], MD;  Location: [facility];  Service: ENT;  Laterality: Right;  . ESOPHAGEAL MANOMETRY N/A 09/07/2014   Procedure: ESOPHAGEAL MANOMETRY (EM);  Surgeon: Martin K Johnson, MD;  Location: [facility];  Service: Endoscopy;  Laterality: N/A;  .   ESOPHAGOGASTRODUODENOSCOPY (EGD) WITH PROPOFOL N/A 12/30/2013   Procedure: ESOPHAGOGASTRODUODENOSCOPY  (EGD) WITH PROPOFOL;  Surgeon: Garlan Fair, MD;  Location: [facility];  Service: Endoscopy;  Laterality: N/A;  . MICROLARYNGOSCOPY Right 04/24/2013   Procedure: MICROLARYNGOSCOPY and RIGHT VOCAL CORD BIOPSY;  Surgeon: Jerrell Belfast, MD;  Location: [facility];  Service: ENT;  Laterality: Right;  . MULTIPLE EXTRACTIONS WITH ALVEOLOPLASTY N/A 09/30/2013   Procedure: Extraction of tooth #'s 2,3,4,5,6,7,8,9,10,11,12,15,22,23,24,25,26,27,28 with alveoloplasty;  Surgeon: Lenn Cal, DDS;  Location: [facility];  Service: Oral Surgery;  Laterality: N/A;  . TONSILLECTOMY     as a child  . wisdon teeth     age 71   Family History:  Family History  Problem Relation Age of Onset  . COPD Mother   . Emphysema Mother   . Heart attack Father    Family Psychiatric  History: unknown Social History:  History  Alcohol Use No     History  Drug Use No    Social History   Social History  . Marital status: Legally Separated    Spouse name: N/A  . Number of children: 3  . Years of education: N/A   Occupational History  .  [employer]   Social History Main Topics  . Smoking status: Current Every Day Smoker    Packs/day: 0.20    Years: 34.00    Types: Cigarettes  . Smokeless tobacco: Never Used     Comment: DOWN TO 4 - 5 CIGARETTES A DAY  . Alcohol use No  . Drug use: No  . Sexual activity: Not Asked   Other Topics Concern  . None   Social History Narrative   The patient is married. Patient has 3 children. 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   The patient has a history of smoking 2 packs of cigarette a day for 34 years. Patient current is trying to quit smoking. Patient denies use of alcohol. Patient denies use of other illicit drugs.   Additional Social History:    Allergies:   Allergies  Allergen Reactions  . Fentanyl Nausea And Vomiting  . Citalopram Other (See Comments)    Dizziness and shakiness.  . Morphine And Related Nausea And Vomiting     Labs:  Results for orders placed or performed during the hospital encounter of 03/19/17 (from the past 48 hour(s))  Glucose, capillary     Status: Abnormal   Collection Time: 03/20/17 11:30 AM  Result Value Ref Range   Glucose-Capillary 114 (H) 65 - 99 mg/dL   Comment 1 Capillary Specimen    Comment 2 Notify RN   Glucose, capillary     Status: Abnormal   Collection Time: 03/20/17  3:11 PM  Result Value Ref Range   Glucose-Capillary 120 (H) 65 - 99 mg/dL   Comment 1 Capillary Specimen   Glucose, capillary     Status: Abnormal   Collection Time: 03/20/17  9:18 PM  Result Value Ref Range   Glucose-Capillary 117 (H) 65 - 99 mg/dL   Comment 1 Capillary Specimen   Glucose, capillary     Status: Abnormal   Collection Time: 03/20/17 11:27 PM  Result Value Ref Range   Glucose-Capillary 109 (H) 65 - 99 mg/dL   Comment 1 Capillary Specimen   Glucose, capillary     Status: Abnormal   Collection Time: 03/21/17  4:05 AM  Result Value Ref Range   Glucose-Capillary 141 (H) 65 - 99 mg/dL  Comment 1 Notify RN    Comment 2 Document in Chart   Procalcitonin     Status: None   Collection Time: 03/21/17  4:09 AM  Result Value Ref Range   Procalcitonin <0.10 ng/mL    Comment:        Interpretation: PCT (Procalcitonin) <= 0.5 ng/mL: Systemic infection (sepsis) is not likely. Local bacterial infection is possible. (NOTE)         ICU PCT Algorithm               Non ICU PCT Algorithm    ----------------------------     ------------------------------         PCT < 0.25 ng/mL                 PCT < 0.1 ng/mL     Stopping of antibiotics            Stopping of antibiotics       strongly encouraged.               strongly encouraged.    ----------------------------     ------------------------------       PCT level decrease by               PCT < 0.25 ng/mL       >= 80% from peak PCT       OR PCT 0.25 - 0.5 ng/mL          Stopping of antibiotics                                              encouraged.     Stopping of antibiotics           encouraged.    ----------------------------     ------------------------------       PCT level decrease by              PCT >= 0.25 ng/mL       < 80% from peak PCT        AND PCT >= 0.5 ng/mL            Continuin g antibiotics                                              encouraged.       Continuing antibiotics            encouraged.    ----------------------------     ------------------------------     PCT level increase compared          PCT > 0.5 ng/mL         with peak PCT AND          PCT >= 0.5 ng/mL             Escalation of antibiotics                                          strongly encouraged.      Escalation of antibiotics        strongly encouraged.   CBC     Status: Abnormal   Collection  Time: 03/21/17  4:09 AM  Result Value Ref Range   WBC 9.4 4.0 - 10.5 K/uL   RBC 3.96 (L) 4.22 - 5.81 MIL/uL   Hemoglobin 12.4 (L) 13.0 - 17.0 g/dL   HCT 37.7 (L) 39.0 - 52.0 %   MCV 95.2 78.0 - 100.0 fL   MCH 31.3 26.0 - 34.0 pg   MCHC 32.9 30.0 - 36.0 g/dL   RDW 15.7 (H) 11.5 - 15.5 %   Platelets 169 150 - 400 K/uL  Basic metabolic panel     Status: Abnormal   Collection Time: 03/21/17  4:09 AM  Result Value Ref Range   Sodium 143 135 - 145 mmol/L   Potassium 3.4 (L) 3.5 - 5.1 mmol/L   Chloride 115 (H) 101 - 111 mmol/L   CO2 23 22 - 32 mmol/L   Glucose, Bld 138 (H) 65 - 99 mg/dL   BUN 10 6 - 20 mg/dL   Creatinine, Ser 1.26 (H) 0.61 - 1.24 mg/dL   Calcium 7.0 (L) 8.9 - 10.3 mg/dL   GFR calc non Af Amer >60 >60 mL/min   GFR calc Af Amer >60 >60 mL/min    Comment: (NOTE) The eGFR has been calculated using the CKD EPI equation. This calculation has not been validated in all clinical situations. eGFR's persistently <60 mL/min signify possible Chronic Kidney Disease.    Anion gap 5 5 - 15  Magnesium     Status: None   Collection Time: 03/21/17  4:09 AM  Result Value Ref Range   Magnesium 1.7 1.7 - 2.4 mg/dL  Phosphorus      Status: None   Collection Time: 03/21/17  4:09 AM  Result Value Ref Range   Phosphorus 2.7 2.5 - 4.6 mg/dL  Blood gas, arterial     Status: Abnormal   Collection Time: 03/21/17  4:25 AM  Result Value Ref Range   FIO2 40.00    Delivery systems VENTILATOR    Mode PRESSURE REGULATED VOLUME CONTROL    VT 620 mL   LHR 18 resp/min   Peep/cpap 5.0 cm H20   pH, Arterial 7.475 (H) 7.350 - 7.450   pCO2 arterial 28.6 (L) 32.0 - 48.0 mmHg   pO2, Arterial 80.6 (L) 83.0 - 108.0 mmHg   Bicarbonate 20.8 20.0 - 28.0 mmol/L   Acid-base deficit 2.3 (H) 0.0 - 2.0 mmol/L   O2 Saturation 96.1 %   Patient temperature 98.6    Collection site RIGHT RADIAL    Drawn by [id number]    Sample type ARTERIAL DRAW    Allens test (pass/fail) PASS PASS  Glucose, capillary     Status: Abnormal   Collection Time: 03/21/17  7:34 AM  Result Value Ref Range   Glucose-Capillary 119 (H) 65 - 99 mg/dL   Comment 1 Capillary Specimen   Glucose, capillary     Status: Abnormal   Collection Time: 03/21/17 11:25 AM  Result Value Ref Range   Glucose-Capillary 135 (H) 65 - 99 mg/dL   Comment 1 Capillary Specimen   BMET in AM     Status: Abnormal   Collection Time: 03/22/17  5:17 AM  Result Value Ref Range   Sodium 142 135 - 145 mmol/L   Potassium 3.8 3.5 - 5.1 mmol/L   Chloride 114 (H) 101 - 111 mmol/L   CO2 22 22 - 32 mmol/L   Glucose, Bld 126 (H) 65 - 99 mg/dL   BUN 16 6 - 20 mg/dL   Creatinine, Ser 1.17 0.61 -   1.24 mg/dL   Calcium 7.8 (L) 8.9 - 10.3 mg/dL   GFR calc non Af Amer >60 >60 mL/min   GFR calc Af Amer >60 >60 mL/min    Comment: (NOTE) The eGFR has been calculated using the CKD EPI equation. This calculation has not been validated in all clinical situations. eGFR's persistently <60 mL/min signify possible Chronic Kidney Disease.    Anion gap 6 5 - 15    Current Facility-Administered Medications  Medication Dose Route Frequency Provider Last Rate Last Dose  . acetaminophen (TYLENOL) tablet 650 mg  650  mg Oral Q4H PRN Babcock, Peter E, NP   650 mg at 03/21/17 0943  . albuterol (PROVENTIL,VENTOLIN) solution continuous neb  2.5 mg/hr Nebulization Continuous Yacoub, Wesam G, MD 0.5 mL/hr at 03/19/17 1834 2.5 mg/hr at 03/19/17 1834  . dicyclomine (BENTYL) tablet 20 mg  20 mg Oral Q6H PRN Vega, Orlando, MD      . folic acid (FOLVITE) tablet 1 mg  1 mg Oral Daily Vega, Orlando, MD   1 mg at 03/22/17 1039  . heparin injection 5,000 Units  5,000 Units Subcutaneous Q8H Yacoub, Wesam G, MD   5,000 Units at 03/22/17 0500  . hydrocortisone sodium succinate (SOLU-CORTEF) 100 MG injection 50 mg  50 mg Intravenous Q6H Yacoub, Wesam G, MD   50 mg at 03/22/17 1040  . hydrOXYzine (ATARAX/VISTARIL) tablet 25 mg  25 mg Oral Q6H PRN Vega, Orlando, MD      . lactated ringers infusion   Intravenous Continuous Babcock, Peter E, NP 100 mL/hr at 03/22/17 0453    . loperamide (IMODIUM) capsule 2-4 mg  2-4 mg Oral PRN Vega, Orlando, MD      . LORazepam (ATIVAN) tablet 1 mg  1 mg Oral Q6H PRN Vega, Orlando, MD       Or  . LORazepam (ATIVAN) injection 1 mg  1 mg Intravenous Q6H PRN Vega, Orlando, MD      . methocarbamol (ROBAXIN) tablet 500 mg  500 mg Oral Q8H PRN Vega, Orlando, MD      . multivitamin with minerals tablet 1 tablet  1 tablet Oral Daily Vega, Orlando, MD   1 tablet at 03/22/17 1040  . naproxen (NAPROSYN) tablet 500 mg  500 mg Oral BID PRN Vega, Orlando, MD      . ondansetron (ZOFRAN-ODT) disintegrating tablet 4 mg  4 mg Oral Q6H PRN Vega, Orlando, MD      . pantoprazole (PROTONIX) EC tablet 40 mg  40 mg Oral Daily Vega, Orlando, MD      . thiamine (VITAMIN B-1) tablet 100 mg  100 mg Oral Daily Vega, Orlando, MD   100 mg at 03/22/17 1039   Or  . thiamine (B-1) injection 100 mg  100 mg Intravenous Daily Vega, Orlando, MD        Musculoskeletal: Strength & Muscle Tone: decreased Gait & Station: unable to stand Patient leans: N/A  Psychiatric Specialty Exam: Physical Exam as per history and physical  ROS  complaining about hallucinations, substance abuse and history of squamous cell carcinoma focal cord. No Fever-chills, No Headache, No changes with Vision or hearing, reports vertigo No problems swallowing food or Liquids, No Chest pain, Cough or Shortness of Breath, No Abdominal pain, No Nausea or Vommitting, Bowel movements are regular, No Blood in stool or Urine, No dysuria, No new skin rashes or bruises, No new joints pains-aches,  No new weakness, tingling, numbness in any extremity, No recent weight gain or loss, No polyuria,   polydypsia or polyphagia,  A full 10 point Review of Systems was done, except as stated above, all other Review of Systems were negative.  Blood pressure 119/82, pulse 86, temperature 98.1 F (36.7 C), temperature source Oral, resp. rate 20, height 6' (1.829 m), weight 73.7 kg (162 lb 8 oz), SpO2 99 %.Body mass index is 22.04 kg/m.  General Appearance: Guarded  Eye Contact:  Good  Speech:  Clear and Coherent  Volume:  Normal  Mood:  Anxious and Depressed  Affect:  Constricted and Depressed  Thought Process:  Coherent and Goal Directed  Orientation:  Full (Time, Place, and Person)  Thought Content:  Hallucinations: Auditory Visual and Rumination  Suicidal Thoughts:  No  Homicidal Thoughts:  No  Memory:  Immediate;   Fair Recent;   Fair Remote;   Fair  Judgement:  Impaired  Insight:  Fair  Psychomotor Activity:  Restlessness  Concentration:  Concentration: Fair and Attention Span: Poor  Recall:  [place] of Knowledge:  Good  Language:  Good  Akathisia:  Negative  Handed:  Right  AIMS (if indicated):     Assets:  Communication Skills Desire for Improvement Financial Resources/Insurance Housing Leisure Time Physical Health Social Support Talents/Skills Transportation Vocational/Educational  ADL's:  Impaired  Cognition:  Impaired,  Mild and Moderate  Sleep:        Treatment Plan Summary: 51 years old male with polysubstance  dependence including opioid type drug, episodic abuse with delirium. Patient mental status has been improved since admitted to the hospital.  Recommendation: Patient does not meet criteria for capacity to make his own medical decisions as for his current delirium and his mental status has been in and out with hallucinations and non reality, patient mental capacity may improve with the supportive treatment.  We start Haldol 2 mg every 6 hours as needed for agitation along with Ativan 0.5 mg and Benadryl 50 mg as needed for agitation and aggressive behaviors.   Monitor for opiate withdrawal symptoms and CIWA protocol  Patient will benefit from residential substance abuse rehabilitation after completing his treatment.  Patient daughter mention about [facility] and he agree for it Will refer to CSW for substance abuse rehabilitation as after care plan  Daily contact with patient to assess and evaluate symptoms and progress in treatment and Medication management  Disposition: Patient does not meet criteria for psychiatric inpatient admission. Supportive therapy provided about ongoing stressors.  Ambrose Finland, MD 03/22/2017 11:11 AM

## 2017-03-23 ENCOUNTER — Encounter (HOSPITAL_COMMUNITY): Payer: Self-pay | Admitting: *Deleted

## 2017-03-23 DIAGNOSIS — F11221 Opioid dependence with intoxication delirium: Secondary | ICD-10-CM

## 2017-03-23 MED ORDER — NICOTINE 14 MG/24HR TD PT24: 14.0000 mg | MEDICATED_PATCH | Freq: Every day | TRANSDERMAL | Status: DC

## 2017-03-23 MED ORDER — NICOTINE 14 MG/24HR TD PT24
14.0000 mg | MEDICATED_PATCH | Freq: Every day | TRANSDERMAL | Status: DC
  Administered 2017-03-23 – 2017-03-27 (×5): 14 mg via TRANSDERMAL
  Filled 2017-03-23 (×5): qty 1

## 2017-03-23 NOTE — Care Management Note (Signed)
Case Management Note  Patient Details  Name: [name] MRN: [id number] Date of Birth: 04-Nov-1965  Subjective/Objective:  From home with girlfriend,  presented to the hospital after heroin overdose. Required intubation and treatment by critical care doctors. Neurology consulted but signed off as patient's symptoms were deemed secondary to overdose.  Patient tried to leave hospital yesterday and is now IVC'D.  He is in 4 point restraints.                Action/Plan: NCM will follow for dc needs.   Expected Discharge Date:                  Expected Discharge Plan:  Home/Self Care  In-House Referral:  Clinical Social Work  Discharge planning Services  CM Consult  Post Acute Care Choice:    Choice offered to:     DME Arranged:    DME Agency:     HH Arranged:    HH Agency:     Status of Service:  In process, will continue to follow  If discussed at Long Length of Stay Meetings, dates discussed:    Additional Comments:  Zenon Mayo, RN 03/23/2017, 2:44 PM

## 2017-03-23 NOTE — Progress Notes (Signed)
Transferred patient to 3MW01 via bed in bilateral wrist and ankle restraints. Amanda,RN received patient.

## 2017-03-23 NOTE — Plan of Care (Signed)
Problem: Activity: Goal: Risk for activity intolerance will decrease Outcome: Progressing Discussed with patient plan of care for the evening, pain management and restraints with no teach back displayed at this time.

## 2017-03-23 NOTE — Progress Notes (Signed)
CSW confirmed IVC receipt by Magistrate's office.  [name] LCSWA [phone]

## 2017-03-23 NOTE — Progress Notes (Signed)
PROGRESS NOTE    [name]  KDT:[id number] DOB: Feb 15, 1966 DOA: 03/19/2017 PCP: Lucious [name], DO    Brief Narrative:  Patient is a 51 y/o that presented to the hospital after heroin overdose. Required intubation and treatment by our critical care doctors. Neurology consulted by signed off as patient's symptoms were deemed secondary to overdose.  Assessment & Plan:   Active Problems:   Acute respiratory failure with hypoxemia ([place]) - resolved  OD - on heroine. Continue clonidine order set for withdrawal symptoms - psychiatry consulted for further evaluation and recommendations. They have recommended haldol, ativan, and benadryl - place on CIWA protocol - pt wanted to leave but he is still confused. I believe he is a harm to himself at this time.   DVT prophylaxis: Heparin Code Status: Full Family Communication: no family at bedside currently Disposition Plan: d/c with improvement in condition and resolution of withdrawal symptoms.   Consultants:   psychiatry  Procedures:   Antimicrobials: none  Subjective: Still confused  Objective: Vitals:   03/23/17 0325 03/23/17 0332 03/23/17 0725 03/23/17 1208  BP: 101/65  108/70 101/65  Pulse:   62 61  Resp: 16  16 17   Temp: 98.5 F (36.9 C)  99.1 F (37.3 C) 99.1 F (37.3 C)  TempSrc: Oral  Axillary Axillary  SpO2: 98%  97% 98%  Weight:  73.5 kg (162 lb 0.6 oz)    Height:        Intake/Output Summary (Last 24 hours) at 03/23/17 1210 Last data filed at 03/23/17 0848  Gross per 24 hour  Intake              150 ml  Output              350 ml  Net             -200 ml   Filed Weights   03/21/17 0412 03/22/17 0500 03/23/17 0332  Weight: 73.6 kg (162 lb 4.1 oz) 73.7 kg (162 lb 8 oz) 73.5 kg (162 lb 0.6 oz)    Examination:  General exam: in bed, in nad. Confused. Respiratory system: Clear to auscultation. Respiratory effort normal. Equal chest rise. Cardiovascular system: S1 & S2 heard, RRR.    Gastrointestinal system: Abdomen is nondistended, soft and nontender. No organomegaly or masses felt. Normal bowel sounds heard. Central nervous system:  No focal neurological deficits, some confusion Extremities: Symmetric 5 x 5 power. In restraints Skin: No rashes, lesions or ulcers, on limited exam. Psychiatry:  Confusion unable to assess well.  Data Reviewed: I have personally reviewed following labs and imaging studies  CBC:  Recent Labs Lab 03/19/17 1332 03/19/17 1343 03/19/17 1752 03/20/17 0230 03/21/17 0409  WBC  --  4.0 5.4 6.7 9.4  NEUTROABS  --  3.1  --   --   --   HGB 13.9 12.7* 13.3 13.0 12.4*  HCT 41.0 38.4* 40.7 38.5* 37.7*  MCV  --  96.2 96.9 95.1 95.2  PLT  --  144* 147* 156 998   Basic Metabolic Panel:  Recent Labs Lab 03/19/17 1343 03/19/17 1752 03/20/17 0230 03/21/17 0409 03/22/17 0517  NA 140 138 143 143 142  K 3.6 4.0 3.4* 3.4* 3.8  CL 110 110 115* 115* 114*  CO2 24 21* 24 23 22   GLUCOSE 93 169* 152* 138* 126*  BUN 19 16 13 10 16   CREATININE 1.48* 1.43* 1.17 1.26* 1.17  CALCIUM 7.6* 6.9* 7.1* 7.0* 7.8*  MG  --  1.6* 2.0 1.7  --   PHOS  --  2.9 1.7* 2.7  --    GFR: Estimated Creatinine Clearance: 75.9 mL/min (by C-G formula based on SCr of 1.17 mg/dL). Liver Function Tests:  Recent Labs Lab 03/19/17 1343 03/19/17 1752  AST 48* 46*  ALT 25 25  ALKPHOS 23* 25*  BILITOT 0.7 0.7  PROT 3.8* 4.1*  ALBUMIN 2.4* 2.6*    Recent Labs Lab 03/19/17 1752  LIPASE 23  AMYLASE 20*    Recent Labs Lab 03/19/17 1343  AMMONIA 51*   Coagulation Profile:  Recent Labs Lab 03/19/17 1752  INR 1.10   Cardiac Enzymes: No results for input(s): CKTOTAL, CKMB, CKMBINDEX, TROPONINI in the last 168 hours. BNP (last 3 results) No results for input(s): PROBNP in the last 8760 hours. HbA1C: No results for input(s): HGBA1C in the last 72 hours. CBG:  Recent Labs Lab 03/20/17 2118 03/20/17 2327 03/21/17 0405 03/21/17 0734 03/21/17 1125   GLUCAP 117* 109* 141* 119* 135*   Lipid Profile: No results for input(s): CHOL, HDL, LDLCALC, TRIG, CHOLHDL, LDLDIRECT in the last 72 hours. Thyroid Function Tests: No results for input(s): TSH, T4TOTAL, FREET4, T3FREE, THYROIDAB in the last 72 hours. Anemia Panel: No results for input(s): VITAMINB12, FOLATE, FERRITIN, TIBC, IRON, RETICCTPCT in the last 72 hours. Sepsis Labs:  Recent Labs Lab 03/19/17 1343 03/19/17 1752 03/20/17 0230 03/21/17 0409  PROCALCITON  --  <0.10 <0.10 <0.10  LATICACIDVEN 1.7  --   --   --     Recent Results (from the past 240 hour(s))  Blood Cultures (routine x 2)     Status: None (Preliminary result)   Collection Time: 03/19/17  1:42 PM  Result Value Ref Range Status   Specimen Description BLOOD LEFT HAND  Final   Special Requests   Final    BOTTLES DRAWN AEROBIC AND ANAEROBIC Blood Culture adequate volume   Culture NO GROWTH 3 DAYS  Final   Report Status PENDING  Incomplete  Urine culture     Status: None   Collection Time: 03/19/17  2:19 PM  Result Value Ref Range Status   Specimen Description URINE, RANDOM  Final   Special Requests NONE  Final   Culture NO GROWTH  Final   Report Status 03/20/2017 FINAL  Final  Blood Cultures (routine x 2)     Status: None (Preliminary result)   Collection Time: 03/19/17  2:20 PM  Result Value Ref Range Status   Specimen Description BLOOD LEFT ARM  Final   Special Requests   Final    BOTTLES DRAWN AEROBIC AND ANAEROBIC Blood Culture adequate volume   Culture NO GROWTH 3 DAYS  Final   Report Status PENDING  Incomplete  MRSA PCR Screening     Status: None   Collection Time: 03/19/17  8:04 PM  Result Value Ref Range Status   MRSA by PCR NEGATIVE NEGATIVE Final    Comment:        The GeneXpert MRSA Assay (FDA approved for NASAL specimens only), is one component of a comprehensive MRSA colonization surveillance program. It is not intended to diagnose MRSA infection nor to guide or monitor treatment  for MRSA infections.      Radiology Studies: No results found.  Scheduled Meds: . folic acid  1 mg Oral Daily  . heparin  5,000 Units Subcutaneous Q8H  . hydrocortisone sodium succinate  50 mg Intravenous Q6H  . multivitamin with minerals  1 tablet Oral Daily  . pantoprazole  40 mg Oral Daily  . thiamine  100 mg Oral Daily   Continuous Infusions: . albuterol 2.5 mg/hr (03/19/17 1834)  . lactated ringers 100 mL/hr at 03/23/17 0614     LOS: 4 days    Time spent: > 35 minutes  Velvet Bathe, MD [facility] Pager [phone]  If 7PM-7AM, please contact night-coverage [email or web address] Password [email or web address] 03/23/2017, 12:10 PM

## 2017-03-23 NOTE — Plan of Care (Signed)
Problem: Activity: Goal: Risk for activity intolerance will decrease Outcome: Not Progressing Patient is in four point restraints.

## 2017-03-23 NOTE — Progress Notes (Signed)
Report given to [facility] in 3MW at 2105.

## 2017-03-24 LAB — CULTURE, BLOOD (ROUTINE X 2)
CULTURE: NO GROWTH
Culture: NO GROWTH
Special Requests: ADEQUATE
Special Requests: ADEQUATE

## 2017-03-24 MED ORDER — SODIUM CHLORIDE 0.9 % IV BOLUS (SEPSIS)
1000.0000 mL | Freq: Once | INTRAVENOUS | Status: AC
  Administered 2017-03-24: 1000 mL via INTRAVENOUS

## 2017-03-24 NOTE — Progress Notes (Signed)
Dr Wendee Beavers text paged alerted to Pt's low urine output and soft BP 's at times today - order received for infusion of 1 Liter of NS IV

## 2017-03-24 NOTE — Progress Notes (Signed)
PROGRESS NOTE    DRAKO MAESE  NID:[id number] DOB: Sep 16, 1965 DOA: 03/19/2017 PCP: Lucious [name], DO    Brief Narrative:  Patient is a 51 y/o that presented to the hospital after heroin overdose. Required intubation and treatment by our critical care doctors. Neurology consulted by signed off as patient's symptoms were deemed secondary to overdose.  Assessment & Plan:   Active Problems:   Acute respiratory failure with hypoxemia ([place]) - resolved  OD - on heroine. Continue clonidine order set for withdrawal symptoms - psychiatry consulted for further evaluation and recommendations. They have recommended haldol, ativan, and benadryl. Will continue - place on CIWA protocol - pt wanted to leave but he is still confused. I believe he is a harm to himself at this time.   Nicotine abuse - will provide nicotine patch for withdrawals  DVT prophylaxis: Heparin Code Status: Full Family Communication: no family at bedside currently Disposition Plan: d/c with improvement in condition and resolution of withdrawal symptoms. Will discuss with social worker to get patient into facility which may help patient abstain from using drugs   Consultants:   psychiatry  Procedures:   Antimicrobials: none  Subjective: Pt wants to get into facility which will help him lay off drugs  Objective: Vitals:   03/24/17 0712 03/24/17 1000 03/24/17 1100 03/24/17 1153  BP: 109/76 113/75 104/77   Pulse: 68 63 (!) 58   Resp: 16 18 17    Temp: 98.9 F (37.2 C)   98 F (36.7 C)  TempSrc: Axillary   Oral  SpO2: 97% 96% 96%   Weight:      Height:        Intake/Output Summary (Last 24 hours) at 03/24/17 1317 Last data filed at 03/24/17 0600  Gross per 24 hour  Intake             5100 ml  Output              750 ml  Net             4350 ml   Filed Weights   03/23/17 0332 03/24/17 0300 03/24/17 0314  Weight: 73.5 kg (162 lb 0.6 oz) 74.6 kg (164 lb 7.4 oz) 74.6 kg (164 lb 7.4 oz)     Examination:  General exam: in bed, in nad. Alert and awake Respiratory system: Clear to auscultation. Respiratory effort normal. Equal chest rise. Cardiovascular system: S1 & S2 heard, RRR. No rubs Gastrointestinal system: Abdomen is nondistended, soft and nontender. No organomegaly or masses felt. Normal bowel sounds heard. Central nervous system:  No focal neurological deficits, some confusion Extremities: Symmetric 5 x 5 power.  Skin: No rashes, lesions or ulcers, on limited exam. Psychiatry:  Confusion unable to assess well.  Data Reviewed: I have personally reviewed following labs and imaging studies  CBC:  Recent Labs Lab 03/19/17 1332 03/19/17 1343 03/19/17 1752 03/20/17 0230 03/21/17 0409  WBC  --  4.0 5.4 6.7 9.4  NEUTROABS  --  3.1  --   --   --   HGB 13.9 12.7* 13.3 13.0 12.4*  HCT 41.0 38.4* 40.7 38.5* 37.7*  MCV  --  96.2 96.9 95.1 95.2  PLT  --  144* 147* 156 144   Basic Metabolic Panel:  Recent Labs Lab 03/19/17 1343 03/19/17 1752 03/20/17 0230 03/21/17 0409 03/22/17 0517  NA 140 138 143 143 142  K 3.6 4.0 3.4* 3.4* 3.8  CL 110 110 115* 115* 114*  CO2 24 21* 24  23 22  GLUCOSE 93 169* 152* 138* 126*  BUN 19 16 13 10 16   CREATININE 1.48* 1.43* 1.17 1.26* 1.17  CALCIUM 7.6* 6.9* 7.1* 7.0* 7.8*  MG  --  1.6* 2.0 1.7  --   PHOS  --  2.9 1.7* 2.7  --    GFR: Estimated Creatinine Clearance: 77 mL/min (by C-G formula based on SCr of 1.17 mg/dL). Liver Function Tests:  Recent Labs Lab 03/19/17 1343 03/19/17 1752  AST 48* 46*  ALT 25 25  ALKPHOS 23* 25*  BILITOT 0.7 0.7  PROT 3.8* 4.1*  ALBUMIN 2.4* 2.6*    Recent Labs Lab 03/19/17 1752  LIPASE 23  AMYLASE 20*    Recent Labs Lab 03/19/17 1343  AMMONIA 51*   Coagulation Profile:  Recent Labs Lab 03/19/17 1752  INR 1.10   Cardiac Enzymes: No results for input(s): CKTOTAL, CKMB, CKMBINDEX, TROPONINI in the last 168 hours. BNP (last 3 results) No results for input(s):  PROBNP in the last 8760 hours. HbA1C: No results for input(s): HGBA1C in the last 72 hours. CBG:  Recent Labs Lab 03/20/17 2118 03/20/17 2327 03/21/17 0405 03/21/17 0734 03/21/17 1125  GLUCAP 117* 109* 141* 119* 135*   Lipid Profile: No results for input(s): CHOL, HDL, LDLCALC, TRIG, CHOLHDL, LDLDIRECT in the last 72 hours. Thyroid Function Tests: No results for input(s): TSH, T4TOTAL, FREET4, T3FREE, THYROIDAB in the last 72 hours. Anemia Panel: No results for input(s): VITAMINB12, FOLATE, FERRITIN, TIBC, IRON, RETICCTPCT in the last 72 hours. Sepsis Labs:  Recent Labs Lab 03/19/17 1343 03/19/17 1752 03/20/17 0230 03/21/17 0409  PROCALCITON  --  <0.10 <0.10 <0.10  LATICACIDVEN 1.7  --   --   --     Recent Results (from the past 240 hour(s))  Blood Cultures (routine x 2)     Status: None   Collection Time: 03/19/17  1:42 PM  Result Value Ref Range Status   Specimen Description BLOOD LEFT HAND  Final   Special Requests   Final    BOTTLES DRAWN AEROBIC AND ANAEROBIC Blood Culture adequate volume   Culture NO GROWTH 5 DAYS  Final   Report Status 03/24/2017 FINAL  Final  Urine culture     Status: None   Collection Time: 03/19/17  2:19 PM  Result Value Ref Range Status   Specimen Description URINE, RANDOM  Final   Special Requests NONE  Final   Culture NO GROWTH  Final   Report Status 03/20/2017 FINAL  Final  Blood Cultures (routine x 2)     Status: None   Collection Time: 03/19/17  2:20 PM  Result Value Ref Range Status   Specimen Description BLOOD LEFT ARM  Final   Special Requests   Final    BOTTLES DRAWN AEROBIC AND ANAEROBIC Blood Culture adequate volume   Culture NO GROWTH 5 DAYS  Final   Report Status 03/24/2017 FINAL  Final  MRSA PCR Screening     Status: None   Collection Time: 03/19/17  8:04 PM  Result Value Ref Range Status   MRSA by PCR NEGATIVE NEGATIVE Final    Comment:        The GeneXpert MRSA Assay (FDA approved for NASAL specimens only), is  one component of a comprehensive MRSA colonization surveillance program. It is not intended to diagnose MRSA infection nor to guide or monitor treatment for MRSA infections.      Radiology Studies: No results found.  Scheduled Meds: . folic acid  1 mg Oral  Daily  . heparin  5,000 Units Subcutaneous Q8H  . hydrocortisone sodium succinate  50 mg Intravenous Q6H  . multivitamin with minerals  1 tablet Oral Daily  . nicotine  14 mg Transdermal Daily  . pantoprazole  40 mg Oral Daily  . thiamine  100 mg Oral Daily   Continuous Infusions: . albuterol 2.5 mg/hr (03/19/17 1834)  . lactated ringers 1,000 mL (03/24/17 1157)     LOS: 5 days   Time spent: > 35 minutes  Velvet Bathe, MD [facility] Pager [phone]  If 7PM-7AM, please contact night-coverage [email or web address] Password [email or web address] 03/24/2017, 1:17 PM

## 2017-03-24 NOTE — Progress Notes (Signed)
Pt much more alert today - took off wrist restraints Pt much more cooperative oriented to year, place off by just a day with orientation questions Still has a sitter at bedside

## 2017-03-25 MED ORDER — PHENOL 1.4 % MT LIQD
1.0000 | OROMUCOSAL | Status: DC | PRN
  Administered 2017-03-25 – 2017-03-27 (×2): 1 via OROMUCOSAL
  Filled 2017-03-25: qty 177

## 2017-03-25 MED ORDER — TAMSULOSIN HCL 0.4 MG PO CAPS
0.4000 mg | ORAL_CAPSULE | Freq: Once | ORAL | Status: AC
  Administered 2017-03-25: 0.4 mg via ORAL
  Filled 2017-03-25: qty 1

## 2017-03-25 NOTE — Progress Notes (Signed)
PROGRESS NOTE    [name]  QQV:[id number] DOB: August 14, 1965 DOA: 03/19/2017 PCP: Lucious [name], DO    Brief Narrative:  Patient is a 51 y/o that presented to the hospital after heroin overdose. Required intubation and treatment by our critical care doctors. Neurology consulted by signed off as patient's symptoms were deemed secondary to overdose.  Pt transitioned to stepdown unit due to intensity of withdrawal symptoms. Psychiatry consulted for assistance.  Assessment & Plan:   Active Problems:   Acute respiratory failure with hypoxemia ([place]) - resolved  OD - on heroine. Continue clonidine order set for withdrawal symptoms - psychiatry consulted for assistance with withdrawal symptoms. With improvement in symptoms will plan on discharging patient and recommend outpatient social support services for abstinence of illegal drug use.   Nicotine abuse - will provide nicotine patch for withdrawals  DVT prophylaxis: Heparin Code Status: Full Family Communication: no family at bedside currently Disposition Plan: d/c with improvement in condition and resolution of withdrawal symptoms. Will discuss with social worker to get patient into facility which may help patient abstain from using drugs   Consultants:   psychiatry  Procedures:   Antimicrobials: none  Subjective: Pt still wants to get into facility which will help him lay off drugs  Objective: Vitals:   03/25/17 0402 03/25/17 0500 03/25/17 0600 03/25/17 0822  BP: (!) 110/54 105/74 109/80   Pulse: 73 65 73   Resp: 17 15 (!) 22   Temp: 98 F (36.7 C)     TempSrc: Oral   Oral  SpO2: 96% 97% 94%   Weight:      Height:        Intake/Output Summary (Last 24 hours) at 03/25/17 1023 Last data filed at 03/25/17 3329  Gross per 24 hour  Intake          3158.33 ml  Output              620 ml  Net          2538.33 ml   Filed Weights   03/24/17 0300 03/24/17 0314 03/25/17 0222  Weight: 74.6 kg (164 lb 7.4 oz)  74.6 kg (164 lb 7.4 oz) 77.2 kg (170 lb 3.1 oz)    Examination:  General exam: in bed, in nad. Alert and awake Respiratory system: Clear to auscultation. Respiratory effort normal. Equal chest rise. Cardiovascular system: S1 & S2 heard, RRR. No rubs Gastrointestinal system: Abdomen is nondistended, soft and nontender. No organomegaly or masses felt. Normal bowel sounds heard. Central nervous system:  No focal neurological deficits, some confusion Extremities: Symmetric 5 x 5 power.  Skin: No rashes, lesions or ulcers, on limited exam. Psychiatry:  Confusion unable to assess well.  Data Reviewed: I have personally reviewed following labs and imaging studies  CBC:  Recent Labs Lab 03/19/17 1332 03/19/17 1343 03/19/17 1752 03/20/17 0230 03/21/17 0409  WBC  --  4.0 5.4 6.7 9.4  NEUTROABS  --  3.1  --   --   --   HGB 13.9 12.7* 13.3 13.0 12.4*  HCT 41.0 38.4* 40.7 38.5* 37.7*  MCV  --  96.2 96.9 95.1 95.2  PLT  --  144* 147* 156 518   Basic Metabolic Panel:  Recent Labs Lab 03/19/17 1343 03/19/17 1752 03/20/17 0230 03/21/17 0409 03/22/17 0517  NA 140 138 143 143 142  K 3.6 4.0 3.4* 3.4* 3.8  CL 110 110 115* 115* 114*  CO2 24 21* 24 23 22   GLUCOSE 93  169* 152* 138* 126*  BUN 19 16 13 10 16   CREATININE 1.48* 1.43* 1.17 1.26* 1.17  CALCIUM 7.6* 6.9* 7.1* 7.0* 7.8*  MG  --  1.6* 2.0 1.7  --   PHOS  --  2.9 1.7* 2.7  --    GFR: Estimated Creatinine Clearance: 79.7 mL/min (by C-G formula based on SCr of 1.17 mg/dL). Liver Function Tests:  Recent Labs Lab 03/19/17 1343 03/19/17 1752  AST 48* 46*  ALT 25 25  ALKPHOS 23* 25*  BILITOT 0.7 0.7  PROT 3.8* 4.1*  ALBUMIN 2.4* 2.6*    Recent Labs Lab 03/19/17 1752  LIPASE 23  AMYLASE 20*    Recent Labs Lab 03/19/17 1343  AMMONIA 51*   Coagulation Profile:  Recent Labs Lab 03/19/17 1752  INR 1.10   Cardiac Enzymes: No results for input(s): CKTOTAL, CKMB, CKMBINDEX, TROPONINI in the last 168  hours. BNP (last 3 results) No results for input(s): PROBNP in the last 8760 hours. HbA1C: No results for input(s): HGBA1C in the last 72 hours. CBG:  Recent Labs Lab 03/20/17 2118 03/20/17 2327 03/21/17 0405 03/21/17 0734 03/21/17 1125  GLUCAP 117* 109* 141* 119* 135*   Lipid Profile: No results for input(s): CHOL, HDL, LDLCALC, TRIG, CHOLHDL, LDLDIRECT in the last 72 hours. Thyroid Function Tests: No results for input(s): TSH, T4TOTAL, FREET4, T3FREE, THYROIDAB in the last 72 hours. Anemia Panel: No results for input(s): VITAMINB12, FOLATE, FERRITIN, TIBC, IRON, RETICCTPCT in the last 72 hours. Sepsis Labs:  Recent Labs Lab 03/19/17 1343 03/19/17 1752 03/20/17 0230 03/21/17 0409  PROCALCITON  --  <0.10 <0.10 <0.10  LATICACIDVEN 1.7  --   --   --     Recent Results (from the past 240 hour(s))  Blood Cultures (routine x 2)     Status: None   Collection Time: 03/19/17  1:42 PM  Result Value Ref Range Status   Specimen Description BLOOD LEFT HAND  Final   Special Requests   Final    BOTTLES DRAWN AEROBIC AND ANAEROBIC Blood Culture adequate volume   Culture NO GROWTH 5 DAYS  Final   Report Status 03/24/2017 FINAL  Final  Urine culture     Status: None   Collection Time: 03/19/17  2:19 PM  Result Value Ref Range Status   Specimen Description URINE, RANDOM  Final   Special Requests NONE  Final   Culture NO GROWTH  Final   Report Status 03/20/2017 FINAL  Final  Blood Cultures (routine x 2)     Status: None   Collection Time: 03/19/17  2:20 PM  Result Value Ref Range Status   Specimen Description BLOOD LEFT ARM  Final   Special Requests   Final    BOTTLES DRAWN AEROBIC AND ANAEROBIC Blood Culture adequate volume   Culture NO GROWTH 5 DAYS  Final   Report Status 03/24/2017 FINAL  Final  MRSA PCR Screening     Status: None   Collection Time: 03/19/17  8:04 PM  Result Value Ref Range Status   MRSA by PCR NEGATIVE NEGATIVE Final    Comment:        The GeneXpert  MRSA Assay (FDA approved for NASAL specimens only), is one component of a comprehensive MRSA colonization surveillance program. It is not intended to diagnose MRSA infection nor to guide or monitor treatment for MRSA infections.      Radiology Studies: No results found.  Scheduled Meds: . folic acid  1 mg Oral Daily  . heparin  5,000 Units Subcutaneous Q8H  . hydrocortisone sodium succinate  50 mg Intravenous Q6H  . multivitamin with minerals  1 tablet Oral Daily  . nicotine  14 mg Transdermal Daily  . pantoprazole  40 mg Oral Daily  . thiamine  100 mg Oral Daily   Continuous Infusions: . albuterol 2.5 mg/hr (03/19/17 1834)  . lactated ringers 100 mL/hr at 03/25/17 0226     LOS: 6 days   Time spent: > 35 minutes  Velvet Bathe, MD [facility] Pager [phone]  If 7PM-7AM, please contact night-coverage [email or web address] Password [facility] 03/25/2017, 10:23 AM

## 2017-03-25 NOTE — Plan of Care (Signed)
Problem: Pain Managment: Goal: General experience of comfort will improve Outcome: Not Progressing Pt still very anxious  Problem: Coping: Goal: Level of anxiety will decrease Outcome: Not Progressing Pt still very anxious from withdrawal

## 2017-03-26 NOTE — Progress Notes (Signed)
PROGRESS NOTE    [name]  XTG:[id number] DOB: [birth year] DOA: 03/19/2017 PCP: Lucious [name], DO    Brief Narrative:  Patient is a 51 y/o that presented to the hospital after heroin overdose. Required intubation and treatment by our critical care doctors. Neurology consulted by signed off as patient's symptoms were deemed secondary to overdose.  Pt transitioned to stepdown unit due to intensity of withdrawal symptoms. Psychiatry consulted for assistance.  Assessment & Plan:   Active Problems:   Acute respiratory failure with hypoxemia ([place]) - resolved  OD - on heroine. Continue clonidine order set for withdrawal symptoms - psychiatry consulted for assistance with withdrawal symptoms. - Will most likely discharge next am with continued improvement  Nicotine abuse - will provide nicotine patch for withdrawals  DVT prophylaxis: Heparin Code Status: Full Family Communication: no family at bedside currently Disposition Plan: d/c next am most likely   Consultants:   psychiatry  Procedures:   Antimicrobials: none  Subjective: Pt still wants to get into facility which will help him lay off drugs. He has no new complaints.  Objective: Vitals:   03/26/17 0400 03/26/17 0500 03/26/17 0600 03/26/17 0708  BP: 116/78 110/80  113/80  Pulse: 82 69 85 60  Resp: 15 (!) 21  19  Temp:    98.1 F (36.7 C)  TempSrc:    Oral  SpO2: 92% 95% 100% 92%  Weight:      Height:        Intake/Output Summary (Last 24 hours) at 03/26/17 1234 Last data filed at 03/26/17 1100  Gross per 24 hour  Intake             2960 ml  Output              850 ml  Net             2110 ml   Filed Weights   03/24/17 0314 03/25/17 0222 03/26/17 0327  Weight: 74.6 kg (164 lb 7.4 oz) 77.2 kg (170 lb 3.1 oz) 78.9 kg (173 lb 15.1 oz)    Examination:Exam unchanged when compared to 03/25/2017  General exam: in bed, in nad. Alert and awake Respiratory system: Clear to auscultation. Respiratory  effort normal. Equal chest rise. Cardiovascular system: S1 & S2 heard, RRR. No rubs Gastrointestinal system: Abdomen is nondistended, soft and nontender. No organomegaly or masses felt. Normal bowel sounds heard. Central nervous system:  No focal neurological deficits, some confusion Extremities: Symmetric 5 x 5 power.  Skin: No rashes, lesions or ulcers, on limited exam. Psychiatry:  Confusion unable to assess well.  Data Reviewed: I have personally reviewed following labs and imaging studies  CBC:  Recent Labs Lab 03/19/17 1332 03/19/17 1343 03/19/17 1752 03/20/17 0230 03/21/17 0409  WBC  --  4.0 5.4 6.7 9.4  NEUTROABS  --  3.1  --   --   --   HGB 13.9 12.7* 13.3 13.0 12.4*  HCT 41.0 38.4* 40.7 38.5* 37.7*  MCV  --  96.2 96.9 95.1 95.2  PLT  --  144* 147* 156 270   Basic Metabolic Panel:  Recent Labs Lab 03/19/17 1343 03/19/17 1752 03/20/17 0230 03/21/17 0409 03/22/17 0517  NA 140 138 143 143 142  K 3.6 4.0 3.4* 3.4* 3.8  CL 110 110 115* 115* 114*  CO2 24 21* 24 23 22   GLUCOSE 93 169* 152* 138* 126*  BUN 19 16 13 10 16   CREATININE 1.48* 1.43* 1.17 1.26* 1.17  CALCIUM  7.6* 6.9* 7.1* 7.0* 7.8*  MG  --  1.6* 2.0 1.7  --   PHOS  --  2.9 1.7* 2.7  --    GFR: Estimated Creatinine Clearance: 80.1 mL/min (by C-G formula based on SCr of 1.17 mg/dL). Liver Function Tests:  Recent Labs Lab 03/19/17 1343 03/19/17 1752  AST 48* 46*  ALT 25 25  ALKPHOS 23* 25*  BILITOT 0.7 0.7  PROT 3.8* 4.1*  ALBUMIN 2.4* 2.6*    Recent Labs Lab 03/19/17 1752  LIPASE 23  AMYLASE 20*    Recent Labs Lab 03/19/17 1343  AMMONIA 51*   Coagulation Profile:  Recent Labs Lab 03/19/17 1752  INR 1.10   Cardiac Enzymes: No results for input(s): CKTOTAL, CKMB, CKMBINDEX, TROPONINI in the last 168 hours. BNP (last 3 results) No results for input(s): PROBNP in the last 8760 hours. HbA1C: No results for input(s): HGBA1C in the last 72 hours. CBG:  Recent Labs Lab  03/20/17 2118 03/20/17 2327 03/21/17 0405 03/21/17 0734 03/21/17 1125  GLUCAP 117* 109* 141* 119* 135*   Lipid Profile: No results for input(s): CHOL, HDL, LDLCALC, TRIG, CHOLHDL, LDLDIRECT in the last 72 hours. Thyroid Function Tests: No results for input(s): TSH, T4TOTAL, FREET4, T3FREE, THYROIDAB in the last 72 hours. Anemia Panel: No results for input(s): VITAMINB12, FOLATE, FERRITIN, TIBC, IRON, RETICCTPCT in the last 72 hours. Sepsis Labs:  Recent Labs Lab 03/19/17 1343 03/19/17 1752 03/20/17 0230 03/21/17 0409  PROCALCITON  --  <0.10 <0.10 <0.10  LATICACIDVEN 1.7  --   --   --     Recent Results (from the past 240 hour(s))  Blood Cultures (routine x 2)     Status: None   Collection Time: 03/19/17  1:42 PM  Result Value Ref Range Status   Specimen Description BLOOD LEFT HAND  Final   Special Requests   Final    BOTTLES DRAWN AEROBIC AND ANAEROBIC Blood Culture adequate volume   Culture NO GROWTH 5 DAYS  Final   Report Status 03/24/2017 FINAL  Final  Urine culture     Status: None   Collection Time: 03/19/17  2:19 PM  Result Value Ref Range Status   Specimen Description URINE, RANDOM  Final   Special Requests NONE  Final   Culture NO GROWTH  Final   Report Status 03/20/2017 FINAL  Final  Blood Cultures (routine x 2)     Status: None   Collection Time: 03/19/17  2:20 PM  Result Value Ref Range Status   Specimen Description BLOOD LEFT ARM  Final   Special Requests   Final    BOTTLES DRAWN AEROBIC AND ANAEROBIC Blood Culture adequate volume   Culture NO GROWTH 5 DAYS  Final   Report Status 03/24/2017 FINAL  Final  MRSA PCR Screening     Status: None   Collection Time: 03/19/17  8:04 PM  Result Value Ref Range Status   MRSA by PCR NEGATIVE NEGATIVE Final    Comment:        The GeneXpert MRSA Assay (FDA approved for NASAL specimens only), is one component of a comprehensive MRSA colonization surveillance program. It is not intended to diagnose  MRSA infection nor to guide or monitor treatment for MRSA infections.      Radiology Studies: No results found.  Scheduled Meds: . folic acid  1 mg Oral Daily  . heparin  5,000 Units Subcutaneous Q8H  . multivitamin with minerals  1 tablet Oral Daily  . nicotine  14 mg  Transdermal Daily  . pantoprazole  40 mg Oral Daily  . thiamine  100 mg Oral Daily   Continuous Infusions: . albuterol 2.5 mg/hr (03/19/17 1834)  . lactated ringers 100 mL/hr at 03/26/17 0838     LOS: 7 days   Time spent: > 35 minutes  Velvet Bathe, MD [facility] Pager [phone]  If 7PM-7AM, please contact night-coverage [email or web address] Password [facility] 03/26/2017, 12:34 PM

## 2017-03-26 NOTE — Plan of Care (Signed)
Problem: Pain Managment: Goal: General experience of comfort will improve Outcome: Progressing Discussed with patient about plan of care for the evening and pain management with some teach back displayed.  Patient complained of a sore throat and difficulty starting his urine.    Comments: Topics: Ambulation and Mobility; Frequency of Medications and Decreased ability to urinate

## 2017-03-26 NOTE — Progress Notes (Signed)
Physical Therapy Treatment Patient Details Name: [name] MRN: [id number] DOB: Apr 11, 1966 Today's Date: 03/26/2017    History of Present Illness Pt adm after drug overdose. Intubated on admission and extubated 03/21/17. PMH - polysubstance abuse, vocal cord cancer, and chronic pain.    PT Comments    Patient progressing slowly towards PT goals. Requires some encouragement and coaxing for mobility today due to pain "everywhere" and wanting to sleep. Requires Min A for gait training due to staggering and instability. Pt refused further activity. Eager to return home with daughter tomorrow. Pt with soft BP but no dizziness during exercise. Will follow.   Follow Up Recommendations  No PT follow up     Equipment Recommendations  None recommended by PT    Recommendations for Other Services       Precautions / Restrictions Precautions Precautions: Fall Precaution Comments: soft BP Restrictions Weight Bearing Restrictions: No    Mobility  Bed Mobility Overal bed mobility: Needs Assistance Bed Mobility: Supine to Sit;Sit to Supine     Supine to sit: Modified independent (Device/Increase time);HOB elevated Sit to supine: Modified independent (Device/Increase time);HOB elevated   General bed mobility comments: Incr time; no assist needed.  Transfers Overall transfer level: Needs assistance Equipment used: None Transfers: Sit to/from Stand Sit to Stand: Min guard         General transfer comment: Min guard for safety. Stood from [employer], from chair x1.   Ambulation/Gait Ambulation/Gait assistance: Min assist;Min guard Ambulation Distance (Feet): 175 Feet Assistive device: None Gait Pattern/deviations: Decreased stride length;Drifts right/left;Staggering left;Scissoring;Staggering right Gait velocity: decr Gait velocity interpretation: Below normal speed for age/gender General Gait Details: Slow, unsteady gait with staggering right/left, scissoring gait x1, decreased  awm swing. Min A to steady at times, mostly min guard.    Stairs            Wheelchair Mobility    Modified Rankin (Stroke Patients Only)       Balance Overall balance assessment: Needs assistance Sitting-balance support: No upper extremity supported;Feet supported Sitting balance-Leahy Scale: Good     Standing balance support: During functional activity Standing balance-Leahy Scale: Fair Standing balance comment: Able to stand statically without UE support but requires UE support for dynamic standing.                            Cognition Arousal/Alertness: Awake/alert Behavior During Therapy: WFL for tasks assessed/performed Overall Cognitive Status: Impaired/Different from baseline Area of Impairment: Problem solving;Safety/judgement                         Safety/Judgement: Decreased awareness of safety   Problem Solving: Slow processing General Comments: poor insight into deficits.      Exercises      General Comments General comments (skin integrity, edema, etc.): Sitting BP 108/65, Sitting BP post ambulation 106/77      Pertinent Vitals/Pain Pain Assessment: Faces Faces Pain Scale: Hurts whole lot Pain Location: everywhere Pain Descriptors / Indicators: Sore Pain Intervention(s): Monitored during session;Repositioned;Patient requesting pain meds-RN notified;Limited activity within patient's tolerance    Home Living                      Prior Function            PT Goals (current goals can now be found in the care plan section) Progress towards PT goals: Progressing toward goals  Frequency    Min 3X/week      PT Plan Current plan remains appropriate    Co-evaluation              AM-PAC PT "6 Clicks" Daily Activity  Outcome Measure  Difficulty turning over in bed (including adjusting bedclothes, sheets and blankets)?: None Difficulty moving from lying on back to sitting on the side of the bed? :  None Difficulty sitting down on and standing up from a chair with arms (e.g., wheelchair, bedside commode, etc,.)?: None Help needed moving to and from a bed to chair (including a wheelchair)?: A Little Help needed walking in hospital room?: A Little Help needed climbing 3-5 steps with a railing? : A Little 6 Click Score: 21    End of Session Equipment Utilized During Treatment: Gait belt Activity Tolerance: Patient limited by pain;Patient limited by fatigue Patient left: in bed;with call bell/phone within reach;with nursing/sitter in room Nurse Communication: Mobility status PT Visit Diagnosis: Unsteadiness on feet (R26.81);Muscle weakness (generalized) (M62.81)     Time: 1411-1430 PT Time Calculation (min) (ACUTE ONLY): 19 min  Charges:  $Gait Training: 8-22 mins                    G Codes:       Wray Kearns, PT, DPT [place] 03/26/2017, 2:33 PM

## 2017-03-26 NOTE — Progress Notes (Signed)
Patient states he has a problem starting his stream with urination a problem he has had prior to admission.  He was bladder scanned with only 181 ML in bladder pre-voiding last night and 34 ML post voiding.  Patient did get Flomax one-time dose last night to try and help.

## 2017-03-26 NOTE — Progress Notes (Signed)
Case reviewed for LOS; B Lalitha Ilyas RN,MHA,BSN [phone] 

## 2017-03-26 NOTE — Plan of Care (Signed)
Problem: Coping: Goal: Level of anxiety will decrease Outcome: Progressing Pt still anxious from withdrawal

## 2017-03-27 MED ORDER — HYDROXYZINE HCL 25 MG PO TABS: 25.0000 mg | ORAL_TABLET | Freq: Four times a day (QID) | ORAL | 0 refills | Status: DC | PRN

## 2017-03-27 NOTE — Discharge Summary (Signed)
Physician Discharge Summary  [name] GHW:[id number] DOB: 15-Nov-1965 DOA: 03/19/2017  PCP: Leslie [name], DO  Admit date: 03/19/2017 Discharge date: 03/27/2017  Time spent: > 35 minutes  Recommendations for Outpatient Follow-up:  1.    Discharge Diagnoses:  Principal Problem:   Polysubstance dependence including opioid type drug, episodic abuse, with delirium ([place]) Active Problems:   Acute respiratory failure with hypoxemia ([name])   Shock ([name])   Unresponsive   Discharge Condition: stable  Diet recommendation: Heart healthy diet.  Filed Weights   03/25/17 0222 03/26/17 0327 03/27/17 0445  Weight: 77.2 kg (170 lb 3.1 oz) 78.9 kg (173 lb 15.1 oz) 78.9 kg (173 lb 15.1 oz)    History of present illness:  51 y/o that presented to the hospital after heroin overdose. Required intubation and treatment by our critical care doctors. Neurology consulted by signed off as patient's symptoms were deemed secondary to overdose.  Pt transitioned to stepdown unit due to intensity of withdrawal symptoms. Psychiatry consulted for assistance.  Hospital Course:  Acute respiratory failure with hypoxemia ([place]) - resolved  OD - Pt on clonidine for withdrawal in house. He declined any clonidine on discharge. Provide hydroxyzine for anxiety - Pt is now not confused and feels better. Will revoke IVC paperwork as patient is no longer a harm to himself due to resolution his initial confusion. -psychiatry consulted for assistance with withdrawal symptoms.  Nicotine abuse - Provided nicotine patch for withdrawals in house.  Procedures:  None  Consultations:  Psychiatry  Discharge Exam: Vitals:   03/27/17 0731 03/27/17 0811  BP: 113/85 132/85  Pulse: 60 94  Resp: 14   Temp: 98 F (36.7 C)   SpO2: 90% 90%    General: Pt in nad, alert and awake Cardiovascular: rrr, no rubs Respiratory: no increased wob, no wheezes  Discharge Instructions   Discharge Instructions    Call  MD for:  redness, tenderness, or signs of infection (pain, swelling, redness, odor or green/yellow discharge around incision site)    Complete by:  As directed    Call MD for:  severe uncontrolled pain    Complete by:  As directed    Call MD for:  temperature >100.4    Complete by:  As directed    Diet - low sodium heart healthy    Complete by:  As directed    Discharge instructions    Complete by:  As directed    Please abstain from using illegal drugs. Social worker will provide you with information for programs as outpatient.   Increase activity slowly    Complete by:  As directed      Current Discharge Medication List    START taking these medications   Details  hydrOXYzine (ATARAX/VISTARIL) 25 MG tablet Take 1 tablet (25 mg total) by mouth every 6 (six) hours as needed for anxiety. Qty: 30 tablet, Refills: 0      CONTINUE these medications which have NOT CHANGED   Details  albuterol (PROVENTIL HFA;VENTOLIN HFA) 108 (90 Base) MCG/ACT inhaler Inhale 1-2 puffs into the lungs every 6 (six) hours as needed for wheezing or shortness of breath. Qty: 18 g, Refills: 3   Associated Diagnoses: Pulmonary emphysema, unspecified emphysema type ([place])      STOP taking these medications     levothyroxine (SYNTHROID, LEVOTHROID) 175 MCG tablet      omeprazole (PRILOSEC) 20 MG capsule      Oxycodone HCl 10 MG TABS  Allergies  Allergen Reactions  . Fentanyl Nausea And Vomiting  . Citalopram Other (See Comments)    Dizziness and shakiness.  . Morphine And Related Nausea And Vomiting      The results of significant diagnostics from this hospitalization (including imaging, microbiology, ancillary and laboratory) are listed below for reference.    Significant Diagnostic Studies: Ct Angio Head W Or Wo Contrast  Result Date: 03/19/2017 CLINICAL DATA:  51 y/o M; altered mental status. 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 EXAM: CT ANGIOGRAPHY HEAD AND NECK TECHNIQUE: Multidetector  CT imaging of the head and neck was performed using the standard protocol during bolus administration of intravenous contrast. Multiplanar CT image reconstructions and MIPs were obtained to evaluate the vascular anatomy. Carotid stenosis measurements (when applicable) are obtained utilizing NASCET criteria, using the distal internal carotid diameter as the denominator. CONTRAST:  50 cc Isovue 370 COMPARISON:  03/19/2017 CT head FINDINGS: CTA NECK FINDINGS Aortic arch: Standard branching. Imaged portion shows no evidence of aneurysm or dissection. No significant stenosis of the major arch vessel origins. Right carotid system: No evidence of dissection, stenosis (50% or greater) or occlusion. Mild non stenotic calcified plaque of the right carotid bifurcation. Left carotid system: No evidence of dissection, stenosis (50% or greater) or occlusion. Vertebral arteries: Codominant. No evidence of dissection, stenosis (50% or greater) or occlusion. Skeleton: Negative. Other neck: Debris within oral and nasopharynx is likely due to intubation. Right central venous catheter tip below the field of view in the SVC. Upper chest: Paraseptal and centrilobular emphysema of the right greater than left lung apices. Smooth interlobular septal thickening of the lungs compatible with mild interstitial edema. Review of the MIP images confirms the above findings CTA HEAD FINDINGS Anterior circulation: No significant stenosis, proximal occlusion, aneurysm, or vascular malformation. Posterior circulation: No significant stenosis, proximal occlusion, aneurysm, or vascular malformation. Venous sinuses: As permitted by contrast timing, patent. Anatomic variants: Patent anterior communicating artery and diminutive bilateral posterior communicating arteries. Delayed phase: No abnormal intracranial enhancement. Review of the MIP images confirms the above findings IMPRESSION: 1. Patent carotid and vertebral arteries. No dissection, aneurysm, or  hemodynamically significant stenosis utilizing NASCET criteria. 2. Patent circle of Willis. No large vessel occlusion, aneurysm, or significant stenosis. 3. Paraseptal and centrilobular emphysema of lung apices. Mild interstitial pulmonary edema. Electronically Signed   By: Kristine Garbe M.D.   On: 03/19/2017 17:26   Ct Head Wo Contrast  Result Date: 03/19/2017 CLINICAL DATA:  Altered consciousness. History of vocal cord cancer. Radiation therapy. EXAM: CT HEAD WITHOUT CONTRAST TECHNIQUE: Contiguous axial images were obtained from the base of the skull through the vertex without intravenous contrast. COMPARISON:  03/20/2013 FINDINGS: Brain: No mass lesion, hemorrhage, hydrocephalus, acute infarct, intra-axial, or extra-axial fluid collection. Vascular: No hyperdense vessel or unexpected calcification. Skull: Similar mild left supraorbital scalp soft tissue fullness. Sinuses/Orbits: Normal imaged portions of the orbits and globes. Mucous retention cysts or polyps in the right maxillary sinus. Minimal ethmoid air cell mucosal thickening. Hypoplastic right frontal sinus. Clear mastoid air cells. Other: None. IMPRESSION: 1.  No acute intracranial abnormality. 2. Mild sinus disease. Electronically Signed   By: Abigail Miyamoto M.D.   On: 03/19/2017 13:58   Ct Angio Neck W Or Wo Contrast  Result Date: 03/19/2017 CLINICAL DATA:  51 y/o M; altered mental status. 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 EXAM: CT ANGIOGRAPHY HEAD AND NECK TECHNIQUE: Multidetector CT imaging of the head and neck was performed using the standard protocol during bolus administration of  intravenous contrast. Multiplanar CT image reconstructions and MIPs were obtained to evaluate the vascular anatomy. Carotid stenosis measurements (when applicable) are obtained utilizing NASCET criteria, using the distal internal carotid diameter as the denominator. CONTRAST:  50 cc Isovue 370 COMPARISON:  03/19/2017 CT head FINDINGS: CTA NECK FINDINGS  Aortic arch: Standard branching. Imaged portion shows no evidence of aneurysm or dissection. No significant stenosis of the major arch vessel origins. Right carotid system: No evidence of dissection, stenosis (50% or greater) or occlusion. Mild non stenotic calcified plaque of the right carotid bifurcation. Left carotid system: No evidence of dissection, stenosis (50% or greater) or occlusion. Vertebral arteries: Codominant. No evidence of dissection, stenosis (50% or greater) or occlusion. Skeleton: Negative. Other neck: Debris within oral and nasopharynx is likely due to intubation. Right central venous catheter tip below the field of view in the SVC. Upper chest: Paraseptal and centrilobular emphysema of the right greater than left lung apices. Smooth interlobular septal thickening of the lungs compatible with mild interstitial edema. Review of the MIP images confirms the above findings CTA HEAD FINDINGS Anterior circulation: No significant stenosis, proximal occlusion, aneurysm, or vascular malformation. Posterior circulation: No significant stenosis, proximal occlusion, aneurysm, or vascular malformation. Venous sinuses: As permitted by contrast timing, patent. Anatomic variants: Patent anterior communicating artery and diminutive bilateral posterior communicating arteries. Delayed phase: No abnormal intracranial enhancement. Review of the MIP images confirms the above findings IMPRESSION: 1. Patent carotid and vertebral arteries. No dissection, aneurysm, or hemodynamically significant stenosis utilizing NASCET criteria. 2. Patent circle of Willis. No large vessel occlusion, aneurysm, or significant stenosis. 3. Paraseptal and centrilobular emphysema of lung apices. Mild interstitial pulmonary edema. Electronically Signed   By: Kristine Garbe M.D.   On: 03/19/2017 17:26   Dg Chest Port 1 View  Result Date: 03/21/2017 CLINICAL DATA:  Shortness of breath. EXAM: PORTABLE CHEST 1 VIEW COMPARISON:   03/19/2017. FINDINGS: Endotracheal tube and NG tube noted in stable position. Right IJ line stable position. Right base atelectasis/infiltrate, improved from prior exam. No pleural effusion or pneumothorax . IMPRESSION: 1. Lines and tubes in stable position. 2.  Right base atelectasis/infiltrate, improved from prior exam. Electronically Signed   By: Marcello Moores  Register   On: 03/21/2017 06:50   Dg Chest Port 1 View  Result Date: 03/19/2017 CLINICAL DATA:  Central line placement EXAM: PORTABLE CHEST 1 VIEW COMPARISON:  03/19/2017 FINDINGS: Endotracheal tube tip is approximately 7.5 cm superior to the carina. Esophageal tube tip is below the diaphragm but not included. Right-sided central venous catheter tip overlies the upper SVC. No pneumothorax. Development of right basilar opacity. IMPRESSION: 1. Right-sided central venous catheter tip overlies the SVC. No pneumothorax. 2. Development of right basilar opacity, which may reflect atelectasis or aspiration. Electronically Signed   By: Donavan Foil M.D.   On: 03/19/2017 21:43   Dg Chest Portable 1 View  Result Date: 03/19/2017 CLINICAL DATA:  Chest pain, loss of consciousness. Endotracheal and OG tube placement. EXAM: PORTABLE CHEST 1 VIEW COMPARISON:  03/19/2017 and prior exams FINDINGS: The cardiomediastinal silhouette is unremarkable. An endotracheal tube with tip 7 cm above the carina, OG tube with tip overlying the mid stomach and right IJ central venous catheter with tip overlying the mid SVC noted. Mild pulmonary vascular congestion identified. There is no evidence of focal airspace disease, pulmonary edema, suspicious pulmonary nodule/mass, pleural effusion, or pneumothorax. No acute bony abnormalities are identified. IMPRESSION: 1. Support apparatus as described. 2. Mild pulmonary vascular congestion. Electronically Signed   By:  Margarette Canada M.D.   On: 03/19/2017 17:34   Dg Chest Portable 1 View  Result Date: 03/19/2017 CLINICAL DATA:  Altered level  of consciousness EXAM: PORTABLE CHEST 1 VIEW COMPARISON:  PA and lateral chest x-ray of May 16, 2016 FINDINGS: Lungs are adequately inflated and clear. The heart and pulmonary vascularity are normal. The mediastinum is normal in width. There is no pleural effusion. There is calcification in the wall of the aortic arch. IMPRESSION: There is no acute cardiopulmonary abnormality. Electronically Signed   By: David  Martinique M.D.   On: 03/19/2017 14:11   Dg Abd Portable 1v  Result Date: 03/20/2017 CLINICAL DATA:  Encounter for orogastric (OG) tube placement EXAM: PORTABLE ABDOMEN - 1 VIEW COMPARISON:  None. FINDINGS: Nasal/ orogastric tube passes below the diaphragm into the proximal stomach. There is no bowel dilation to suggest bowel obstruction. Lung bases are clear. IMPRESSION: Nasal/ orogastric tube tip projects in the proximal stomach. Electronically Signed   By: Lajean Manes M.D.   On: 03/20/2017 20:12    Microbiology: Recent Results (from the past 240 hour(s))  Blood Cultures (routine x 2)     Status: None   Collection Time: 03/19/17  1:42 PM  Result Value Ref Range Status   Specimen Description BLOOD LEFT HAND  Final   Special Requests   Final    BOTTLES DRAWN AEROBIC AND ANAEROBIC Blood Culture adequate volume   Culture NO GROWTH 5 DAYS  Final   Report Status 03/24/2017 FINAL  Final  Urine culture     Status: None   Collection Time: 03/19/17  2:19 PM  Result Value Ref Range Status   Specimen Description URINE, RANDOM  Final   Special Requests NONE  Final   Culture NO GROWTH  Final   Report Status 03/20/2017 FINAL  Final  Blood Cultures (routine x 2)     Status: None   Collection Time: 03/19/17  2:20 PM  Result Value Ref Range Status   Specimen Description BLOOD LEFT ARM  Final   Special Requests   Final    BOTTLES DRAWN AEROBIC AND ANAEROBIC Blood Culture adequate volume   Culture NO GROWTH 5 DAYS  Final   Report Status 03/24/2017 FINAL  Final  MRSA PCR Screening     Status:  None   Collection Time: 03/19/17  8:04 PM  Result Value Ref Range Status   MRSA by PCR NEGATIVE NEGATIVE Final    Comment:        The GeneXpert MRSA Assay (FDA approved for NASAL specimens only), is one component of a comprehensive MRSA colonization surveillance program. It is not intended to diagnose MRSA infection nor to guide or monitor treatment for MRSA infections.      Labs: Basic Metabolic Panel:  Recent Labs Lab 03/21/17 0409 03/22/17 0517  NA 143 142  K 3.4* 3.8  CL 115* 114*  CO2 23 22  GLUCOSE 138* 126*  BUN 10 16  CREATININE 1.26* 1.17  CALCIUM 7.0* 7.8*  MG 1.7  --   PHOS 2.7  --    Liver Function Tests: No results for input(s): AST, ALT, ALKPHOS, BILITOT, PROT, ALBUMIN in the last 168 hours. No results for input(s): LIPASE, AMYLASE in the last 168 hours. No results for input(s): AMMONIA in the last 168 hours. CBC:  Recent Labs Lab 03/21/17 0409  WBC 9.4  HGB 12.4*  HCT 37.7*  MCV 95.2  PLT 169   Cardiac Enzymes: No results for input(s): CKTOTAL, CKMB, CKMBINDEX, TROPONINI  in the last 168 hours. BNP: BNP (last 3 results)  Recent Labs  03/19/17 1752  BNP 6.8    ProBNP (last 3 results) No results for input(s): PROBNP in the last 8760 hours.  CBG:  Recent Labs Lab 03/20/17 2118 03/20/17 2327 03/21/17 0405 03/21/17 0734 03/21/17 1125  GLUCAP 117* 109* 141* 119* 135*    Signed:  Velvet Bathe MD.  [facility] 03/27/2017, 12:26 PM

## 2017-03-27 NOTE — Progress Notes (Signed)
Physical Therapy Treatment Patient Details Name: [name] MRN: [id number] DOB: 06-27-1966 Today's Date: 03/27/2017    History of Present Illness Pt adm after drug overdose. Intubated on admission and extubated 03/21/17. PMH - polysubstance abuse, vocal cord cancer, and chronic pain.    PT Comments    Pt very pleasant, awake and alert with disorientation to time by 1 day. Pt moving well with increased endurance and balance however deficits in balance still present and pt would benefit from OPPT to further address balance deficits. Will continue to follow and encouraged daily ambulation and HEP with nursing supervision.   HR 94 SpO2 90% on RA BP 132/85 in standing   Follow Up Recommendations  Outpatient PT     Equipment Recommendations  Rolling walker with 5" wheels    Recommendations for Other Services       Precautions / Restrictions Precautions Precautions: Fall    Mobility  Bed Mobility Overal bed mobility: Modified Independent                Transfers Overall transfer level: Modified independent                  Ambulation/Gait Ambulation/Gait assistance: Min guard Ambulation Distance (Feet): 400 Feet Assistive device: Rolling walker (2 wheeled);None Gait Pattern/deviations: Step-through pattern;Decreased stride length   Gait velocity interpretation: Below normal speed for age/gender General Gait Details: pt walked 150' with Rw without deficit with slow gait. With removal of RW continued controlled gait including head turns however with increased speed with partial LOB and assist to recover   Stairs Stairs: Yes   Stair Management: One rail Right;Alternating pattern;Forwards Number of Stairs: 11 General stair comments: pt able to perform stairs with rail without difficulty  Wheelchair Mobility    Modified Rankin (Stroke Patients Only)       Balance Overall balance assessment: Needs assistance   Sitting balance-Leahy Scale: Good        Standing balance-Leahy Scale: Good   Single Leg Stance - Right Leg: 4 Single Leg Stance - Left Leg: 12     [place] - Eyes Opened: 75 [place] - Eyes Closed: 10 High level balance activites: Head turns High Level Balance Comments: Pt able to perform static standing, [place]berg and SLS although with decreased BOS pt with increased difficulty            Cognition Arousal/Alertness: Awake/alert Behavior During Therapy: WFL for tasks assessed/performed Overall Cognitive Status: Within Functional Limits for tasks assessed                                        Exercises General Exercises - Lower Extremity Long Arc Quad: AROM;Both;Seated;10 reps Hip Flexion/Marching: AROM;Both;Seated;10 reps    General Comments        Pertinent Vitals/Pain Pain Assessment: No/denies pain    Home Living                      Prior Function            PT Goals (current goals can now be found in the care plan section) Progress towards PT goals: Progressing toward goals    Frequency           PT Plan Discharge plan needs to be updated;Equipment recommendations need to be updated    Co-evaluation  AM-PAC PT "6 Clicks" Daily Activity  Outcome Measure  Difficulty turning over in bed (including adjusting bedclothes, sheets and blankets)?: None Difficulty moving from lying on back to sitting on the side of the bed? : None Difficulty sitting down on and standing up from a chair with arms (e.g., wheelchair, bedside commode, etc,.)?: None Help needed moving to and from a bed to chair (including a wheelchair)?: A Little Help needed walking in hospital room?: A Little Help needed climbing 3-5 steps with a railing? : A Little 6 Click Score: 21    End of Session Equipment Utilized During Treatment: Gait belt Activity Tolerance: Patient tolerated treatment well Patient left: in chair;with call bell/phone within reach;with nursing/sitter in  room Nurse Communication: Mobility status       Time: 1610-9604 PT Time Calculation (min) (ACUTE ONLY): 27 min  Charges:  $Gait Training: 8-22 mins $Neuromuscular Re-education: 8-22 mins                    G Codes:       Elwyn Reach, [place]    [place] 03/27/2017, 8:15 AM

## 2017-03-27 NOTE — Care Management Note (Signed)
Case Management Note  Patient Details  Name: [name] MRN: [id number] Date of Birth: [birth year]  Subjective/Objective:    From home with girlfriend,  presented to the hospital after heroin overdose. Required intubation and treatment by critical care doctors. Neurology consulted but signed off as patient's symptoms were deemed secondary to overdose.  Patient tried to leave hospital yesterday and is now IVC'D.  He is in 4 point restraints  9/4 [place], BSN - patient for dc today, on visceral, script was sent to [facility], and will be $4.  Patient has bus pass and a follow up apt at [place] per CSW.  He also states he has transportation to [place] to get his medication.                Action/Plan:   Expected Discharge Date:  03/27/17               Expected Discharge Plan:  Home/Self Care  In-House Referral:  Clinical Social Work  Discharge planning Services  CM Consult  Post Acute Care Choice:    Choice offered to:     DME Arranged:    DME Agency:     HH Arranged:    HH Agency:     Status of Service:  Completed, signed off  If discussed at [employer], dates discussed:    Additional Comments:  Zenon Mayo, RN 03/27/2017, 2:48 PM

## 2017-03-27 NOTE — Progress Notes (Signed)
IVC revoked- confirmed that paperwork faxed successfully to magistrate office  CSW spoke with pt concerning rehab for drug use- patient agreeable to inpatient rehab stay.  CSW called [facility] with pt permission and set up appointment for intake assessment for 9/5 at 7:45am- patient informed of these details.  CSW provided bus passes for patient to get to rehab tomorrow morning  CSW signing off  Jorge Ny, [place] Social Worker [phone]

## 2017-03-27 NOTE — Progress Notes (Signed)
Discharge paperwork and meds gone over w/ pt. Pt states no questions or concerns. Pt to be d/c'd to home

## 2017-04-12 ENCOUNTER — Telehealth: Payer: Self-pay

## 2017-04-12 NOTE — Telephone Encounter (Signed)
Questions about medications

## 2017-04-12 NOTE — Telephone Encounter (Signed)
rtc to pt, he states he is worried about his thyroid, states dr Heber [place] told him he would need thyroid med forever and now the dr he saw inpt told him not to take it. appt w/ dr Heber [place] scheduled for 11/1,  Does he need to be seen in [facility] before 11/1? Does he need labwork before then? Does he need to stop taking levothyroxine for now?

## 2017-04-13 ENCOUNTER — Other Ambulatory Visit: Payer: Self-pay | Admitting: *Deleted

## 2017-04-13 DIAGNOSIS — E034 Atrophy of thyroid (acquired): Secondary | ICD-10-CM

## 2017-04-13 MED ORDER — LEVOTHYROXINE SODIUM 175 MCG PO TABS: 175.0000 ug | ORAL_TABLET | Freq: Every day | ORAL | 0 refills | Status: DC

## 2017-04-13 NOTE — Telephone Encounter (Signed)
Can you please tell him that he does need to be on Levothyroxine.  I would recommend he continue the dose of 147mcg daily until he sees me on 11/1 (I would be happy to provide a refill if he does not have any), I do not think additional labs at this time will change management (TSH was very elevated in the hospital, I would be very supprised if they instructed him to stop thyroid medication).

## 2017-04-18 NOTE — Telephone Encounter (Signed)
Have attempted to call pt, no answer

## 2017-04-23 ENCOUNTER — Emergency Department (HOSPITAL_COMMUNITY): Payer: Self-pay

## 2017-04-23 ENCOUNTER — Encounter (HOSPITAL_COMMUNITY): Payer: Self-pay | Admitting: *Deleted

## 2017-04-23 ENCOUNTER — Emergency Department (HOSPITAL_COMMUNITY)
Admission: EM | Admit: 2017-04-23 | Discharge: 2017-04-23 | Disposition: A | Discharge status: Home / Self Care | Payer: Self-pay | Attending: Emergency Medicine | Admitting: Emergency Medicine

## 2017-04-23 DIAGNOSIS — Z8521 Personal history of malignant neoplasm of larynx: Secondary | ICD-10-CM | POA: Insufficient documentation

## 2017-04-23 DIAGNOSIS — Z79899 Other long term (current) drug therapy: Secondary | ICD-10-CM | POA: Insufficient documentation

## 2017-04-23 DIAGNOSIS — R202 Paresthesia of skin: Secondary | ICD-10-CM | POA: Insufficient documentation

## 2017-04-23 DIAGNOSIS — E039 Hypothyroidism, unspecified: Secondary | ICD-10-CM | POA: Insufficient documentation

## 2017-04-23 DIAGNOSIS — F17210 Nicotine dependence, cigarettes, uncomplicated: Secondary | ICD-10-CM | POA: Insufficient documentation

## 2017-04-23 DIAGNOSIS — R1310 Dysphagia, unspecified: Secondary | ICD-10-CM | POA: Insufficient documentation

## 2017-04-23 DIAGNOSIS — F1910 Other psychoactive substance abuse, uncomplicated: Secondary | ICD-10-CM | POA: Insufficient documentation

## 2017-04-23 DIAGNOSIS — L03114 Cellulitis of left upper limb: Secondary | ICD-10-CM | POA: Insufficient documentation

## 2017-04-23 DIAGNOSIS — M542 Cervicalgia: Secondary | ICD-10-CM | POA: Insufficient documentation

## 2017-04-23 LAB — COMPREHENSIVE METABOLIC PANEL
ALBUMIN: 3.5 g/dL (ref 3.5–5.0)
ALK PHOS: 38 U/L (ref 38–126)
ALT: 31 U/L (ref 17–63)
ANION GAP: 5 (ref 5–15)
AST: 36 U/L (ref 15–41)
BILIRUBIN TOTAL: 0.6 mg/dL (ref 0.3–1.2)
BUN: 16 mg/dL (ref 6–20)
CALCIUM: 9 mg/dL (ref 8.9–10.3)
CO2: 28 mmol/L (ref 22–32)
CREATININE: 1.54 mg/dL — AB (ref 0.61–1.24)
Chloride: 107 mmol/L (ref 101–111)
GFR calc non Af Amer: 50 mL/min — ABNORMAL LOW (ref >60)
GFR, EST AFRICAN AMERICAN: 58 mL/min — AB (ref >60)
GLUCOSE: 79 mg/dL (ref 65–99)
Potassium: 4.2 mmol/L (ref 3.5–5.1)
SODIUM: 140 mmol/L (ref 135–145)
TOTAL PROTEIN: 5.9 g/dL — AB (ref 6.5–8.1)

## 2017-04-23 LAB — CBC WITH DIFFERENTIAL/PLATELET
Basophils Absolute: 0 10*3/uL (ref 0.0–0.1)
Basophils Relative: 1 %
EOS ABS: 0.2 10*3/uL (ref 0.0–0.7)
Eosinophils Relative: 3 %
HEMATOCRIT: 38.4 % — AB (ref 39.0–52.0)
HEMOGLOBIN: 12.6 g/dL — AB (ref 13.0–17.0)
LYMPHS ABS: 1.6 10*3/uL (ref 0.7–4.0)
Lymphocytes Relative: 25 %
MCH: 32.5 pg (ref 26.0–34.0)
MCHC: 32.8 g/dL (ref 30.0–36.0)
MCV: 99 fL (ref 78.0–100.0)
MONOS PCT: 6 %
Monocytes Absolute: 0.4 10*3/uL (ref 0.1–1.0)
NEUTROS ABS: 4.2 10*3/uL (ref 1.7–7.7)
NEUTROS PCT: 65 %
Platelets: 202 10*3/uL (ref 150–400)
RBC: 3.88 MIL/uL — AB (ref 4.22–5.81)
RDW: 16.2 % — ABNORMAL HIGH (ref 11.5–15.5)
WBC: 6.4 10*3/uL (ref 4.0–10.5)

## 2017-04-23 MED ORDER — CLINDAMYCIN PHOSPHATE 600 MG/50ML IV SOLN
600.0000 mg | Freq: Once | INTRAVENOUS | Status: AC
  Administered 2017-04-23: 600 mg via INTRAVENOUS
  Filled 2017-04-23: qty 50

## 2017-04-23 MED ORDER — IBUPROFEN 800 MG PO TABS: 800.0000 mg | ORAL_TABLET | Freq: Three times a day (TID) | ORAL | 0 refills | Status: DC

## 2017-04-23 MED ORDER — CLINDAMYCIN HCL 150 MG PO CAPS: 300.0000 mg | ORAL_CAPSULE | Freq: Three times a day (TID) | ORAL | 0 refills | Status: DC

## 2017-04-23 MED ORDER — GADOBENATE DIMEGLUMINE 529 MG/ML IV SOLN
15.0000 mL | Freq: Once | INTRAVENOUS | Status: AC
  Administered 2017-04-23: 15 mL via INTRAVENOUS

## 2017-04-23 NOTE — ED Triage Notes (Signed)
Pt states that he is here with swelling in both his arms and it appears he has a large abscess on left forearm and the start of one on his right.  Pt has numbness to hands and feet since his last time he was here with overdose.

## 2017-04-23 NOTE — ED Provider Notes (Signed)
[facility] DEPT Provider Note   CSN: [id number] Arrival date & time: 04/23/17  1125     History   Chief Complaint Chief Complaint  Patient presents with  . Abscess    HPI [name] is a 51 y.o. male.  HPI  The patient is a 51 year old male, he has a known history significant for drug abuse including heroin for which she was recently admitted to the hospital to the intensive care unit intubated and resuscitated. 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 He reports that since being intubated and resuscitated in the hospital for his significant and severe overdose he has had an ongoing feeling of tingling and numbness in his hands and his feet, he has some difficulty with any sensation in those areas which is totally new. He also complains of a new neck pain in his posterior neck but also has a feeling of dysphagia in his throat. He did have a CT scan of his neck at the time of admission to the hospital without any focal findings at that time. He denies any fevers but has had some progressive lesions on his arms including 2 small spots of purulent pustules on the right forearm as well as a large area approximately 6 cm in diameter of gradually worsening redness and swelling of his left arm. Again the patient denies fevers at this time though sometimes he does feel subjectively hot. There is no lesions on his legs. No pain or tenderness in his abdomen and no coughing or shortness of breath out of his usual cough. The patient is not currently employed, denies any alcohol or drug use since his discharge from the hospital several weeks ago  Past Medical History:  Diagnosis Date  . Headache(784.0)   . Heart murmur    child  . Hx of radiation therapy 05/19/13- 06/27/13   glottic larynx, right true vocal cord 6300 cGy 28 sessions  . Hypothyroidism   . Shortness of breath   . 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 Polysubstance dependence including opioid type drug, episodic abuse, with delirium ([place]) 03/22/2017  . Shock ([place])   . Unresponsive   . Acute respiratory failure with hypoxemia ([place]) 03/19/2017  . Homelessness 10/25/2016  . Long term (current) use of opiate analgesic 10/11/2016  . Emphysema of lung ([place]) 12/18/2014  . Esophageal dysphagia 09/10/2014  . Tobacco use disorder 09/10/2014  . Routine health maintenance 07/09/2014  . Hypothyroidism due to acquired atrophy of thyroid 06/12/2014  . GERD (gastroesophageal reflux disease) 06/12/2014  . Hx of radiation therapy   . 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 05/06/2013  . Hoarseness of voice 03/17/2013    Past Surgical History:  Procedure Laterality Date  . BALLOON DILATION N/A 12/30/2013   Procedure: BALLOON DILATION;  Surgeon: Garlan Fair, MD;  Location: [facility];  Service: Endoscopy;  Laterality: N/A;  . DIRECT LARYNGOSCOPY Right 04/24/2013   Procedure: DIRECT LARYNGOSCOPY and BIOPSY OF TONGUE;  Surgeon: Jerrell Belfast, MD;  Location: [facility];  Service: ENT;  Laterality: Right;  . ESOPHAGEAL MANOMETRY N/A 09/07/2014   Procedure: ESOPHAGEAL MANOMETRY (EM);  Surgeon: Garlan Fair, MD;  Location: [facility];  Service: Endoscopy;  Laterality: N/A;  . ESOPHAGOGASTRODUODENOSCOPY (  EGD) WITH PROPOFOL N/A 12/30/2013   Procedure: ESOPHAGOGASTRODUODENOSCOPY (EGD) WITH PROPOFOL;  Surgeon: Garlan Fair, MD;  Location: [facility];  Service: Endoscopy;  Laterality: N/A;  . MICROLARYNGOSCOPY Right 04/24/2013   Procedure: MICROLARYNGOSCOPY and RIGHT VOCAL CORD BIOPSY;  Surgeon: Jerrell Belfast, MD;  Location: [facility];  Service: ENT;  Laterality: Right;  . MULTIPLE EXTRACTIONS WITH ALVEOLOPLASTY N/A 09/30/2013   Procedure: Extraction of tooth #'s  2,3,4,5,6,7,8,9,10,11,12,15,22,23,24,25,26,27,28 with alveoloplasty;  Surgeon: Lenn Cal, DDS;  Location: [facility];  Service: Oral Surgery;  Laterality: N/A;  . TONSILLECTOMY     as a child  . wisdon teeth     age 34       Home Medications    Prior to Admission medications   Medication Sig Start Date End Date Taking? Authorizing Provider  Aspirin-Salicylamide-Caffeine (BC HEADACHE POWDER PO) Take 1 packet by mouth every 8 (eight) hours as needed (for pain or headaches).   Yes [provider]  clindamycin (CLEOCIN) 150 MG capsule Take 2 capsules (300 mg total) by mouth 3 (three) times daily. May dispense as 150mg  capsules 04/23/17   Noemi Chapel, MD  hydrOXYzine (ATARAX/VISTARIL) 25 MG tablet Take 1 tablet (25 mg total) by mouth every 6 (six) hours as needed for anxiety. Patient not taking: Reported on 04/23/2017 03/27/17   Velvet Bathe, MD  ibuprofen (ADVIL,MOTRIN) 800 MG tablet Take 1 tablet (800 mg total) by mouth 3 (three) times daily. 04/23/17   Noemi Chapel, MD  levothyroxine (SYNTHROID, LEVOTHROID) 175 MCG tablet Take 1 tablet (175 mcg total) by mouth daily before breakfast. Void previous prescription on file for levothyroxine Patient not taking: Reported on 04/23/2017 04/13/17   Lucious [name], DO    Family History Family History  Problem Relation Age of Onset  . COPD Mother   . Emphysema Mother   . Heart attack Father     Social History Social History  Substance Use Topics  . Smoking status: Current Every Day Smoker    Packs/day: 0.20    Years: 34.00    Types: Cigarettes  . Smokeless tobacco: Never Used     Comment: DOWN TO 4 - 5 CIGARETTES A DAY  . Alcohol use No     Allergies   Fentanyl; Citalopram; and Morphine and related   Review of Systems Review of Systems  All other systems reviewed and are negative.    Physical Exam Updated Vital Signs BP 126/84 (BP Location: Left Arm)   Pulse 95   Temp 97.8 F (36.6 C) (Oral)   Resp 20   SpO2  100%   Physical Exam  Constitutional: He appears well-developed and well-nourished. No distress.  HENT:  Head: Normocephalic and atraumatic.  Mouth/Throat: Oropharynx is clear and moist. No oropharyngeal exudate.  The oropharynx is clear and moist, the phonation is raspy but baseline, no signs of abscesses obstructions or difficulty with the airway. No trismus or torticollis.  Eyes: Pupils are equal, round, and reactive to light. Conjunctivae and EOM are normal. Right eye exhibits no discharge. Left eye exhibits no discharge. No scleral icterus.  Neck: Normal range of motion. Neck supple. No JVD present. No thyromegaly present.  Supple neck, no significant lymphadenopathy  Cardiovascular: Normal rate, regular rhythm, normal heart sounds and intact distal pulses.  Exam reveals no gallop and no friction rub.   No murmur heard. Pulmonary/Chest: Effort normal and breath sounds normal. No respiratory distress. He has no wheezes. He has no rales.  Abdominal: Soft. Bowel sounds are normal. He  exhibits no distension and no mass. There is no tenderness.  Musculoskeletal: Normal range of motion. He exhibits tenderness ( Over the red area on the skin of the forearm on the left). He exhibits no edema.  Lymphadenopathy:    He has no cervical adenopathy.  Neurological: He is alert. Coordination normal.  The patient's speech is clear, memory is intact, his coordination is normal, gait is normal, strength is normal in all 4 extremities at the major muscle groups and joints. He has decreased sensation to his lower extremities below the knees as well as his upper extremities below the elbows.  Skin: Skin is warm and dry. Rash noted. There is erythema.  The patient has a area of 6 cm of induration and redness to the left volar forearm over the proximal area. There is supple joints, there is a small pustule over the right forearm.  Psychiatric: He has a normal mood and affect. His behavior is normal.  Nursing  note and vitals reviewed.    ED Treatments / Results  Labs (all labs ordered are listed, but only abnormal results are displayed) Labs Reviewed  CBC WITH DIFFERENTIAL/PLATELET - Abnormal; Notable for the following:       Result Value   RBC 3.88 (*)    Hemoglobin 12.6 (*)    HCT 38.4 (*)    RDW 16.2 (*)    All other components within normal limits  COMPREHENSIVE METABOLIC PANEL - Abnormal; Notable for the following:    Creatinine, Ser 1.54 (*)    Total Protein 5.9 (*)    GFR calc non Af Amer 50 (*)    GFR calc Af Amer 58 (*)    All other components within normal limits    EKG  EKG Interpretation None       Radiology Mr Jeri Cos And Wo Contrast  Result Date: 04/23/2017 CLINICAL DATA:  Initial evaluation for acute neck pain, abscess on left arm, upper extremity numbness. Evaluate for infection. EXAM: MRI HEAD WITHOUT AND WITH CONTRAST MRI CERVICAL SPINE WITHOUT AND WITH CONTRAST TECHNIQUE: Multiplanar, multiecho pulse sequences of the brain and surrounding structures, and cervical spine, to include the craniocervical junction and cervicothoracic junction, were obtained without and with intravenous contrast. CONTRAST:  15 cc MULTIHANCE GADOBENATE DIMEGLUMINE 529 MG/ML IV SOLN COMPARISON:  Prior CT from 03/19/2017. FINDINGS: MRI HEAD FINDINGS Brain: Cerebral volume within normal limits. Few tiny subcentimeter T2/FLAIR hyperintense foci seen involving the supratentorial cerebral white matter, nonspecific, but felt to be within normal limits for age. No foci of restricted diffusion to suggest acute or subacute ischemia. No encephalomalacia to suggest chronic infarction. No susceptibility artifact to suggest acute or chronic intracranial hemorrhage. No mass lesion, midline shift, or mass effect. No hydrocephalus. No extra-axial fluid collection. Major dural sinuses are patent. No abnormal enhancement. No findings to suggest intracranial infection. Pituitary gland and suprasellar region  normal. Midline structures intact and normal. Vascular: Major intracranial vascular flow voids well maintained. Skull and upper cervical spine: Craniocervical junction normal. Bone marrow signal intensity within normal limits. No scalp soft tissue abnormality. Sinuses/Orbits: Globes and oval soft tissues within normal limits. Scattered retention cysts noted within the maxillary sinuses bilaterally. Paranasal sinuses otherwise clear. Small bilateral mastoid effusions noted, right greater than left. Other: None. MRI CERVICAL SPINE FINDINGS Alignment: Straightening with slight reversal of the normal cervical lordosis. Trace retrolisthesis of C5 on C6. Vertebrae: Vertebral body heights maintained. No evidence for acute or chronic fracture. Bone marrow signal intensity within normal limits. No  discrete or worrisome osseous lesions. No abnormal edema or enhancement. Cord: Signal intensity within the cervical spinal cord is normal. No epidural abscess or abnormal enhancement. Posterior Fossa, vertebral arteries, paraspinal tissues: Craniocervical junction normal. Paraspinous soft tissues within normal limits. No abnormal prevertebral edema. No abnormal soft tissue enhancement. Normal intravascular flow voids present within the vertebral arteries bilaterally. Disc levels: C2-C3: Unremarkable. C3-C4:  Unremarkable. C4-C5: Mild diffuse disc bulge with bilateral uncovertebral hypertrophy. Resultant mild left C5 foraminal stenosis. No canal or significant right foraminal encroachment. C5-C6: Diffuse degenerative disc bulge with disc desiccation and intervertebral disc space narrowing. Trace retrolisthesis. Mild marginal endplate spurring. Broad posterior disc osteophyte flattens the ventral thecal sac and results in mild spinal stenosis. Mild bilateral C6 foraminal narrowing. C6-C7:  Unremarkable. C7-T1:  Unremarkable. Visualized upper thoracic spine within normal limits. IMPRESSION: MRI HEAD IMPRESSION: 1. Normal brain MRI.   No acute intracranial abnormality identified. 2. Bilateral mastoid effusions. Findings are nonspecific, and often benign/sterile in nature. Correlation physical exam recommended. MRI CERVICAL SPINE IMPRESSION: 1. No acute abnormality within the cervical spine. No evidence for acute infection. 2. Degenerative disc bulge at C5-6 with resultant mild canal and bilateral C6 foraminal stenosis. 3. Mild disc bulge at C4-5 with resultant mild left C5 foraminal stenosis. Electronically Signed   By: Jeannine Boga M.D.   On: 04/23/2017 17:13   Mr Cervical Spine W Or Wo Contrast  Result Date: 04/23/2017 CLINICAL DATA:  Initial evaluation for acute neck pain, abscess on left arm, upper extremity numbness. Evaluate for infection. EXAM: MRI HEAD WITHOUT AND WITH CONTRAST MRI CERVICAL SPINE WITHOUT AND WITH CONTRAST TECHNIQUE: Multiplanar, multiecho pulse sequences of the brain and surrounding structures, and cervical spine, to include the craniocervical junction and cervicothoracic junction, were obtained without and with intravenous contrast. CONTRAST:  15 cc MULTIHANCE GADOBENATE DIMEGLUMINE 529 MG/ML IV SOLN COMPARISON:  Prior CT from 03/19/2017. FINDINGS: MRI HEAD FINDINGS Brain: Cerebral volume within normal limits. Few tiny subcentimeter T2/FLAIR hyperintense foci seen involving the supratentorial cerebral white matter, nonspecific, but felt to be within normal limits for age. No foci of restricted diffusion to suggest acute or subacute ischemia. No encephalomalacia to suggest chronic infarction. No susceptibility artifact to suggest acute or chronic intracranial hemorrhage. No mass lesion, midline shift, or mass effect. No hydrocephalus. No extra-axial fluid collection. Major dural sinuses are patent. No abnormal enhancement. No findings to suggest intracranial infection. Pituitary gland and suprasellar region normal. Midline structures intact and normal. Vascular: Major intracranial vascular flow voids well  maintained. Skull and upper cervical spine: Craniocervical junction normal. Bone marrow signal intensity within normal limits. No scalp soft tissue abnormality. Sinuses/Orbits: Globes and oval soft tissues within normal limits. Scattered retention cysts noted within the maxillary sinuses bilaterally. Paranasal sinuses otherwise clear. Small bilateral mastoid effusions noted, right greater than left. Other: None. MRI CERVICAL SPINE FINDINGS Alignment: Straightening with slight reversal of the normal cervical lordosis. Trace retrolisthesis of C5 on C6. Vertebrae: Vertebral body heights maintained. No evidence for acute or chronic fracture. Bone marrow signal intensity within normal limits. No discrete or worrisome osseous lesions. No abnormal edema or enhancement. Cord: Signal intensity within the cervical spinal cord is normal. No epidural abscess or abnormal enhancement. Posterior Fossa, vertebral arteries, paraspinal tissues: Craniocervical junction normal. Paraspinous soft tissues within normal limits. No abnormal prevertebral edema. No abnormal soft tissue enhancement. Normal intravascular flow voids present within the vertebral arteries bilaterally. Disc levels: C2-C3: Unremarkable. C3-C4:  Unremarkable. C4-C5: Mild diffuse disc bulge with bilateral uncovertebral hypertrophy.  Resultant mild left C5 foraminal stenosis. No canal or significant right foraminal encroachment. C5-C6: Diffuse degenerative disc bulge with disc desiccation and intervertebral disc space narrowing. Trace retrolisthesis. Mild marginal endplate spurring. Broad posterior disc osteophyte flattens the ventral thecal sac and results in mild spinal stenosis. Mild bilateral C6 foraminal narrowing. C6-C7:  Unremarkable. C7-T1:  Unremarkable. Visualized upper thoracic spine within normal limits. IMPRESSION: MRI HEAD IMPRESSION: 1. Normal brain MRI.  No acute intracranial abnormality identified. 2. Bilateral mastoid effusions. Findings are  nonspecific, and often benign/sterile in nature. Correlation physical exam recommended. MRI CERVICAL SPINE IMPRESSION: 1. No acute abnormality within the cervical spine. No evidence for acute infection. 2. Degenerative disc bulge at C5-6 with resultant mild canal and bilateral C6 foraminal stenosis. 3. Mild disc bulge at C4-5 with resultant mild left C5 foraminal stenosis. Electronically Signed   By: Jeannine Boga M.D.   On: 04/23/2017 17:13    Procedures Procedures (including critical care time)  Medications Ordered in ED Medications  clindamycin (CLEOCIN) IVPB 600 mg (0 mg Intravenous Stopped 04/23/17 1444)  gadobenate dimeglumine (MULTIHANCE) injection 15 mL (15 mLs Intravenous Contrast Given 04/23/17 1628)     Initial Impression / Assessment and Plan / ED Course  I have reviewed the triage vital signs and the nursing notes.  Pertinent labs & imaging results that were available during my care of the patient were reviewed by me and considered in my medical decision making (see chart for details).    The patient does not appear critically ill he does have symptoms of neurologic findings that could be consistent with a prior hypoxic or ischemic event to either his brain or his cervical spine, would also consider infectious etiologies including a epidural abscess, MRI ordered, labs ordered, antibiotics ordered for his infection. The patient adamantly denies any IV drug use and states he only snorts his drugs.  MRI negative Pt stable for d/c Clindamycin given Rx for home, Pt expressed understanding  Final Clinical Impressions(s) / ED Diagnoses   Final diagnoses:  Cellulitis of left upper extremity    New Prescriptions New Prescriptions   CLINDAMYCIN (CLEOCIN) 150 MG CAPSULE    Take 2 capsules (300 mg total) by mouth 3 (three) times daily. May dispense as 150mg  capsules   IBUPROFEN (ADVIL,MOTRIN) 800 MG TABLET    Take 1 tablet (800 mg total) by mouth 3 (three) times daily.       Noemi Chapel, MD 04/23/17 [phone]

## 2017-04-23 NOTE — Discharge Instructions (Signed)
Clinadmycin 3 times daily  ER for worsening swelling, redness, fevers, pain or swelling Ibuprofen only for pain Your MRI did not show stroke or any infections around your spinal cord.

## 2017-04-23 NOTE — ED Notes (Signed)
Patient transported to MRI 

## 2017-05-04 ENCOUNTER — Telehealth: Payer: Self-pay

## 2017-05-04 ENCOUNTER — Ambulatory Visit: Admission: RE | Admit: 2017-05-04 | Payer: Self-pay | Source: Ambulatory Visit | Admitting: Radiation Oncology

## 2017-05-04 NOTE — Telephone Encounter (Signed)
I called and left a voice mail with [name] regarding his missed appointment at 11:00 today. I left our number to call back to reschedule at his earliest convenience.

## 2017-05-23 NOTE — Progress Notes (Deleted)
[facility] Subjective:  HPI: [name] is a 51 y.o. male who presents for hypothyroidism.  Please see Assessment and Plan below for the status of his chronic medical problems.  Review of Systems: *** Objective:  Physical Exam: There were no vitals filed for this visit. *** Assessment & Plan:  No problem-specific Assessment & Plan notes found for this encounter.   Medications Ordered No orders of the defined types were placed in this encounter.  Other Orders No orders of the defined types were placed in this encounter.  Follow Up: No Follow-up on file.

## 2017-05-24 ENCOUNTER — Encounter: Payer: Self-pay | Admitting: Internal Medicine

## 2017-06-01 ENCOUNTER — Telehealth: Payer: Self-pay | Admitting: *Deleted

## 2017-06-01 ENCOUNTER — Ambulatory Visit
Admission: RE | Admit: 2017-06-01 | Discharge: 2017-06-01 | Disposition: A | Discharge status: Home / Self Care | Payer: Self-pay | Source: Ambulatory Visit | Attending: Radiation Oncology | Admitting: Radiation Oncology

## 2017-06-01 NOTE — Telephone Encounter (Signed)
CALLED PATIENT TO RESCHEDULE FU VISIT FOR TODAY, LVM FOR A RETURN CALL

## 2017-06-01 NOTE — Progress Notes (Signed)
error 

## 2017-06-04 ENCOUNTER — Ambulatory Visit: Payer: Self-pay

## 2017-06-05 ENCOUNTER — Other Ambulatory Visit: Payer: Self-pay

## 2017-06-05 ENCOUNTER — Ambulatory Visit: Payer: Self-pay

## 2017-06-05 ENCOUNTER — Encounter (INDEPENDENT_AMBULATORY_CARE_PROVIDER_SITE_OTHER): Payer: Self-pay

## 2017-06-05 ENCOUNTER — Telehealth: Payer: Self-pay

## 2017-06-05 ENCOUNTER — Ambulatory Visit (INDEPENDENT_AMBULATORY_CARE_PROVIDER_SITE_OTHER): Payer: Self-pay | Admitting: Internal Medicine

## 2017-06-05 DIAGNOSIS — E039 Hypothyroidism, unspecified: Secondary | ICD-10-CM

## 2017-06-05 DIAGNOSIS — E034 Atrophy of thyroid (acquired): Secondary | ICD-10-CM

## 2017-06-05 DIAGNOSIS — I10 Essential (primary) hypertension: Secondary | ICD-10-CM

## 2017-06-05 DIAGNOSIS — Z79890 Hormone replacement therapy (postmenopausal): Secondary | ICD-10-CM

## 2017-06-05 DIAGNOSIS — R490 Dysphonia: Secondary | ICD-10-CM

## 2017-06-05 MED ORDER — LEVOTHYROXINE SODIUM 175 MCG PO TABS: 175.0000 ug | ORAL_TABLET | Freq: Every day | ORAL | 0 refills | Status: DC

## 2017-06-05 MED ORDER — HYDROXYZINE HCL 25 MG PO TABS: 25.0000 mg | ORAL_TABLET | Freq: Four times a day (QID) | ORAL | 0 refills | Status: DC | PRN

## 2017-06-05 NOTE — Telephone Encounter (Signed)
Thank you. I thought we sent the script to the health department instead with a new deal to get them for free. He did not need to go to the [facility]?

## 2017-06-05 NOTE — Telephone Encounter (Signed)
Please be sure to add INTERNAL [facility]ICINE PROGRAM to any scripts sent to op pharm

## 2017-06-05 NOTE — Progress Notes (Signed)
Internal Medicine Clinic Attending  I saw and evaluated the patient.  I personally confirmed the key portions of the history and exam documented by Dr. Berline Lopes and I reviewed pertinent patient test results.  The assessment, diagnosis, and plan were formulated together and I agree with the documentation in the resident's note.  I agree that hypothyroidism could cause hoarseness, and his other spectrum of symptoms.  However, with his h/o laryngeal cancer, there is a possibility that his cancer has recurred.  He should at least be evaluated by an ENT physician if he has no improvement in his hoarseness after a few weeks of treatment with levothyroxine.  I would strongly suggest a good contact number for follow up if he does not schedule an appointment to be seen or is lost to follow up.

## 2017-06-05 NOTE — Telephone Encounter (Signed)
Spoke to pt and pharm, resolved

## 2017-06-05 NOTE — Telephone Encounter (Signed)
Needs to speak with a nurse about meds. Please call pt back.  

## 2017-06-05 NOTE — Assessment & Plan Note (Addendum)
Assessment: 3 wk Hx of hoarseness, chills, weakness, fatigue, constipation, cold intolerance, diastolic hypertension, numbness of the feet and hands. Onset occurred 1.5 wks following discontinuation of his levothyroxine. Occurred over 1-2 days. Denied dysphagia, cough, or neck/chest pain. He was evaluated for the same in December 2017 but his symptoms improved incidentally with increased levothyroxine doses.   Plan: Very likely secondary to the patients hypothyroidism, however, given his history or laryngeal cancer, further evaluation is warranted by ENT if resolution fails to occur within 2 weeks of treatment.  Patient given prescription for levo to [facility] health department free homeless program @ 116mcg daily. He said he can obtain this script. Patient strongly advised multiple times to return in 2 weeks.  Phone # given is [phone]

## 2017-06-05 NOTE — Patient Instructions (Addendum)
Continue your thyroid medication at the previously prescribed dose. You may also continue the hydroxyzine.  I would like to see you back in 2-3 weeks if the symptoms have not resolved.  Thank you for your visit to the [facility].

## 2017-06-05 NOTE — Progress Notes (Signed)
   CC: Can't speak  HPI:  [name] is a 51 y.o. presents to clinic for a three week history of difficulty speaking. He has a past medical history significant for throat cancer(vocal cord) s/p radiation therapy. Lost the ability to speak at that time as well. The initial loss of speech eventually recovered after approximately one year. He denied previous issues until recently. He was at the ED for an upper extremity abscess for which he received and completed a course of clindamycin. He believed he was told to discontinue his levothyroxine as the discharge AVS indicates that the patient should ask his PCP before retaking. The patient adamantly denies being asked what medications he was taking at the time although the system indicates that they were marked not taking.  Of recent he has made no significant changes to his lifestyle. He has discontinued the levothyroxine and hydroxyzine as he was advised in the ED. Denied HA, vomiting, muscle pain, leg cramps, diarrhea, abdominal pain, chest pain or palpitations. Attested to chills, numbness of his hands and feet, fatigue and generalized weakness.  Phone [phone]  Past Medical History:  Diagnosis Date  . Headache(784.0)   . Heart murmur    child  . Hx of radiation therapy 05/19/13- 06/27/13   glottic larynx, right true vocal cord 6300 cGy 28 sessions  . Hypothyroidism   . Shortness of breath   . 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  Physical Exam:  Vitals:   06/05/17 0933  BP: (!) 139/100  Pulse: 76  Temp: 97.7 F (36.5 C)  TempSrc: Oral  SpO2: 100%  Weight: 157 lb (71.2 kg)  Height: 5\' 11"  (1.803 m)   Physical Exam  Constitutional: He appears well-developed and well-nourished. No distress.  HENT:  Head: Atraumatic.  Right Ear: Hearing normal.  Left Ear: Hearing normal.  Mouth/Throat: Uvula is midline and  oropharynx is clear and moist.  Eyes: Conjunctivae and EOM are normal.  Neck: Neck supple.  Cardiovascular: Normal rate and regular rhythm.  Pulmonary/Chest: Effort normal and breath sounds normal. No respiratory distress.  Abdominal: Soft. Bowel sounds are normal. He exhibits no distension.  Musculoskeletal: He exhibits no edema.  Vitals reviewed.   Assessment & Plan:   See Encounters Tab for problem based charting.  Patient seen with Dr. Daryll Drown

## 2017-06-18 ENCOUNTER — Emergency Department (HOSPITAL_COMMUNITY)
Admission: EM | Admit: 2017-06-18 | Discharge: 2017-06-18 | Disposition: A | Discharge status: Home / Self Care | Payer: Self-pay | Attending: Emergency Medicine | Admitting: Emergency Medicine

## 2017-06-18 ENCOUNTER — Other Ambulatory Visit: Payer: Self-pay

## 2017-06-18 ENCOUNTER — Encounter (HOSPITAL_COMMUNITY): Payer: Self-pay | Admitting: Emergency Medicine

## 2017-06-18 ENCOUNTER — Emergency Department (HOSPITAL_COMMUNITY): Payer: Self-pay

## 2017-06-18 DIAGNOSIS — Z79899 Other long term (current) drug therapy: Secondary | ICD-10-CM | POA: Insufficient documentation

## 2017-06-18 DIAGNOSIS — Z789 Other specified health status: Secondary | ICD-10-CM

## 2017-06-18 DIAGNOSIS — E039 Hypothyroidism, unspecified: Secondary | ICD-10-CM | POA: Insufficient documentation

## 2017-06-18 DIAGNOSIS — F17210 Nicotine dependence, cigarettes, uncomplicated: Secondary | ICD-10-CM | POA: Insufficient documentation

## 2017-06-18 DIAGNOSIS — Z85828 Personal history of other malignant neoplasm of skin: Secondary | ICD-10-CM | POA: Insufficient documentation

## 2017-06-18 DIAGNOSIS — T401X1A Poisoning by heroin, accidental (unintentional), initial encounter: Secondary | ICD-10-CM | POA: Insufficient documentation

## 2017-06-18 LAB — BASIC METABOLIC PANEL
ANION GAP: 8 (ref 5–15)
BUN: 18 mg/dL (ref 6–20)
CALCIUM: 8.6 mg/dL — AB (ref 8.9–10.3)
CO2: 26 mmol/L (ref 22–32)
CREATININE: 1.36 mg/dL — AB (ref 0.61–1.24)
Chloride: 102 mmol/L (ref 101–111)
GFR, EST NON AFRICAN AMERICAN: 58 mL/min — AB (ref >60)
Glucose, Bld: 243 mg/dL — ABNORMAL HIGH (ref 65–99)
Potassium: 3.6 mmol/L (ref 3.5–5.1)
SODIUM: 136 mmol/L (ref 135–145)

## 2017-06-18 LAB — CBC
HEMATOCRIT: 33.4 % — AB (ref 39.0–52.0)
Hemoglobin: 11 g/dL — ABNORMAL LOW (ref 13.0–17.0)
MCH: 34.3 pg — ABNORMAL HIGH (ref 26.0–34.0)
MCHC: 32.9 g/dL (ref 30.0–36.0)
MCV: 104 fL — ABNORMAL HIGH (ref 78.0–100.0)
Platelets: 173 10*3/uL (ref 150–400)
RBC: 3.21 MIL/uL — ABNORMAL LOW (ref 4.22–5.81)
RDW: 14.3 % (ref 11.5–15.5)
WBC: 3.6 10*3/uL — AB (ref 4.0–10.5)

## 2017-06-18 MED ORDER — NALOXONE HCL 2 MG/2ML IJ SOSY: PREFILLED_SYRINGE | INTRAMUSCULAR | 0 refills | Status: DC

## 2017-06-18 NOTE — ED Triage Notes (Signed)
Pt in via [facility] as overdose from heroin, call out at 1543. EMS states pt was down on ground, pulseless and not breathing when they arrived at 1557. Pt was given 1.5 of Narcan, CPR was started and pulses/breathing/consciousness regained at 1559. Given another 0.5 of Narcan en route. Pt arrives a&ox4, BP 185/95, CBG 186. Admits to snorting Heroin today

## 2017-06-18 NOTE — ED Provider Notes (Signed)
[place] EMERGENCY DEPARTMENT Provider Note   CSN: [id number] Arrival date & time: 06/18/17  1633     History   Chief Complaint Chief Complaint  Patient presents with  . Drug Overdose  . Post-CPR    HPI [name] is a 51 y.o. male.  The history is provided by the patient and the EMS personnel.  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  Per patient and EMS he was with some friends in a car and he snorted heroin.  EMS was then called when the patient became unresponsive.  When EMS arrived he was apneic and pulseless.  EMS arrived on scene approximately 14 minutes after 911 was called.  CPR was started and he was given 1.5 mg of Narcan IN.  He started having spontaneous respirations a minute and a half into the first cycle of CPR and at the end of the 2-minute cycle he was breathing on his own with a normal pulse.  He was given the remaining 0.5 mg of Narcan IV.  His mental status continued to improve in route and he is now alert and oriented x4.  He states that this was accidental and denies SI or HI.  He denies alcohol use.  Endorses snorting heroin 2-3 times a week.  Denies IV drug use.  Denies any other drug use.  Denies any recent fevers.  Denies chest pain, abdominal pain, shortness of breath.  Past Medical History:  Diagnosis Date  . Headache(784.0)   . Heart murmur    child  . Hx of radiation therapy 05/19/13- 06/27/13   glottic larynx, right true vocal cord 6300 cGy 28 sessions  . Hypothyroidism   . Shortness of breath   . Squamous cell carcinoma of vocal cord ([place]) 09/11/2013  . Vocal cord cancer ([place]) 05/01/2013    Patient Active Problem List   Diagnosis Date Noted  . Polysubstance dependence including opioid type drug, episodic abuse, with delirium ([place]) 03/22/2017  . Shock ([place])   . Unresponsive   . Acute respiratory failure with hypoxemia ([place]) 03/19/2017    . Homelessness 10/25/2016  . Long term (current) use of opiate analgesic 10/11/2016  . Emphysema of lung ([place]) 12/18/2014  . Esophageal dysphagia 09/10/2014  . Tobacco use disorder 09/10/2014  . Routine health maintenance 07/09/2014  . Hypothyroidism due to acquired atrophy of thyroid 06/12/2014  . GERD (gastroesophageal reflux disease) 06/12/2014  . Hx of radiation therapy   . 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 05/06/2013  . Hoarseness of voice 03/17/2013    Past Surgical History:  Procedure Laterality Date  . BALLOON DILATION N/A 12/30/2013   Procedure: BALLOON DILATION;  Surgeon: Garlan Fair, MD;  Location: [facility];  Service: Endoscopy;  Laterality: N/A;  . DIRECT LARYNGOSCOPY Right 04/24/2013   Procedure: DIRECT LARYNGOSCOPY and BIOPSY OF TONGUE;  Surgeon: Jerrell Belfast, MD;  Location: [facility];  Service: ENT;  Laterality: Right;  . ESOPHAGEAL MANOMETRY N/A 09/07/2014   Procedure: ESOPHAGEAL MANOMETRY (EM);  Surgeon: Garlan Fair, MD;  Location: [facility];  Service: Endoscopy;  Laterality: N/A;  . ESOPHAGOGASTRODUODENOSCOPY (EGD) WITH PROPOFOL N/A 12/30/2013   Procedure: ESOPHAGOGASTRODUODENOSCOPY (EGD) WITH PROPOFOL;  Surgeon: Garlan Fair, MD;  Location: [facility];  Service: Endoscopy;  Laterality: N/A;  . MICROLARYNGOSCOPY Right 04/24/2013   Procedure: MICROLARYNGOSCOPY and RIGHT VOCAL CORD BIOPSY;  Surgeon: Jerrell Belfast, MD;  Location:  [facility];  Service: ENT;  Laterality: Right;  . MULTIPLE EXTRACTIONS WITH ALVEOLOPLASTY N/A 09/30/2013   Procedure: Extraction of tooth #'s 2,3,4,5,6,7,8,9,10,11,12,15,22,23,24,25,26,27,28 with alveoloplasty;  Surgeon: Lenn Cal, DDS;  Location: [facility];  Service: Oral Surgery;  Laterality: N/A;  . TONSILLECTOMY     as a child  . wisdon teeth     age 33       Home Medications    Prior to Admission medications   Medication Sig Start Date End Date Taking? Authorizing Provider   Aspirin-Salicylamide-Caffeine (BC HEADACHE POWDER PO) Take 1 packet by mouth every 8 (eight) hours as needed (for pain or headaches).    [provider]  clindamycin (CLEOCIN) 150 MG capsule Take 2 capsules (300 mg total) by mouth 3 (three) times daily. May dispense as 113m capsules 04/23/17   MNoemi Chapel MD  hydrOXYzine (ATARAX/VISTARIL) 25 MG tablet Take 1 tablet (25 mg total) every 6 (six) hours as needed by mouth for anxiety. 06/05/17   HKathi Ludwig MD  ibuprofen (ADVIL,MOTRIN) 800 MG tablet Take 1 tablet (800 mg total) by mouth 3 (three) times daily. 04/23/17   MNoemi Chapel MD  levothyroxine (SYNTHROID, LEVOTHROID) 175 MCG tablet Take 1 tablet (175 mcg total) daily before breakfast by mouth. Void previous prescription on file for levothyroxine 06/05/17   HKathi Ludwig MD  naloxone ([place] 2 MG/2ML injection LMa Ringskit 06/18/17   Faylynn Stamos CMali MD    Family History Family History  Problem Relation Age of Onset  . COPD Mother   . Emphysema Mother   . Heart attack Father     Social History Social History   Tobacco Use  . Smoking status: Current Every Day Smoker    Packs/day: 0.20    Years: 34.00    Pack years: 6.80    Types: Cigarettes  . Smokeless tobacco: Never Used  . Tobacco comment: DOWN TO 4 - 5 CIGARETTES A DAY  Substance Use Topics  . Alcohol use: No    Alcohol/week: 0.0 oz  . Drug use: Yes    Comment: Heroin in past     Allergies   Fentanyl; Citalopram; and Morphine and related   Review of Systems Review of Systems  Constitutional: Negative for chills and fever.  HENT: Negative for ear pain and sore throat.   Eyes: Negative for pain and visual disturbance.  Respiratory: Negative for cough and shortness of breath.   Cardiovascular: Negative for chest pain and palpitations.  Gastrointestinal: Negative for abdominal pain and vomiting.  Genitourinary: Negative for dysuria and hematuria.  Musculoskeletal: Negative for  arthralgias and back pain.  Skin: Negative for color change and rash.  Neurological: Negative for seizures and syncope.  All other systems reviewed and are negative.    Physical Exam Updated Vital Signs BP 104/69 (BP Location: Left Arm)   Pulse 86   Temp 97.9 F (36.6 C) (Oral)   Resp 12   Ht 5' 11" (1.803 m)   Wt 68 kg (150 lb)   SpO2 95%   BMI 20.92 kg/m   Physical Exam  Constitutional: He is oriented to person, place, and time. He appears well-developed and well-nourished.  HENT:  Head: Normocephalic and atraumatic.  Eyes: Conjunctivae and EOM are normal. Pupils are equal, round, and reactive to light.  Neck: Neck supple.  Cardiovascular: Normal rate, regular rhythm, S1 normal, S2 normal, normal heart sounds, intact distal pulses and normal pulses. Exam reveals no gallop and no friction rub.  No murmur heard. Pulmonary/Chest: Effort  normal and breath sounds normal. No respiratory distress. He has no decreased breath sounds. He exhibits no tenderness and no crepitus.  Abdominal: Soft. There is no tenderness.  Musculoskeletal: He exhibits no edema.  Neurological: He is alert and oriented to person, place, and time. He has normal strength. No cranial nerve deficit or sensory deficit. GCS eye subscore is 4. GCS verbal subscore is 5. GCS motor subscore is 6.  Skin: Skin is warm and dry.  Psychiatric: He has a normal mood and affect.  Nursing note and vitals reviewed.    ED Treatments / Results  Labs (all labs ordered are listed, but only abnormal results are displayed) Labs Reviewed  CBC - Abnormal; Notable for the following components:      Result Value   WBC 3.6 (*)    RBC 3.21 (*)    Hemoglobin 11.0 (*)    HCT 33.4 (*)    MCV 104.0 (*)    MCH 34.3 (*)    All other components within normal limits  BASIC METABOLIC PANEL - Abnormal; Notable for the following components:   Glucose, Bld 243 (*)    Creatinine, Ser 1.36 (*)    Calcium 8.6 (*)    GFR calc non Af Amer  58 (*)    All other components within normal limits    EKG  EKG Interpretation None       Radiology Dg Chest Port 1 View  Result Date: 06/18/2017 CLINICAL DATA:  51 y/o  M; post CPR. EXAM: PORTABLE CHEST 1 VIEW COMPARISON:  03/21/2017 chest radiograph FINDINGS: Stable heart size and mediastinal contours are within normal limits. Both lungs are clear. Right greater than left lung apex bullous changes. The visualized skeletal structures are unremarkable. IMPRESSION: No acute pulmonary process identified. Electronically Signed   By: Kristine Garbe M.D.   On: 06/18/2017 17:17    Procedures Procedures (including critical care time)  Medications Ordered in ED Medications - No data to display   Initial Impression / Assessment and Plan / ED Course  I have reviewed the triage vital signs and the nursing notes.  Pertinent labs & imaging results that were available during my care of the patient were reviewed by me and considered in my medical decision making (see chart for details).     50 year old male presenting after an accidental overdose on heroin.  Was initially found pulseless and apneic and responded with Narcan and 2 minutes of CPR.  He is currently afebrile and hemodynamically stable.  Alert and oriented x4.  Denies any chest pain or abdominal pain.  Equal breath sounds bilaterally.  Otherwise well-appearing at this time.  We will monitor the patient to ensure that he does not have recurrent symptoms after the Narcan has worn off.  Will obtain basic labs and chest x-ray as he received CPR.  EKG shows nonspecific T wave changes but no active ischemic changes or signs of dysrhythmia and patient is chest pain-free.  He states he had no symptoms prior to snorting heroin. Chest x-ray unremarkable with no signs of rib fracture or pneumothorax or cardiomegaly.  Labs grossly unremarkable.  Patient observed for almost 2 hours and is been hemodynamically stable and alert and  oriented the whole time.  I offered the Narcan kit to him and he denied as he states that he will never do heroin again.  I wrote him a prescription for Narcan just in case he changes his mind.  I instructed him to follow-up with his PCP as  he was wondering how he could get methadone.  Strict return precautions were given verbally.  Patient amenable to plan.  Final Clinical Impressions(s) / ED Diagnoses   Final diagnoses:  Accidental overdose of heroin, initial encounter [facility])    ED Discharge Orders        Ordered    naloxone [facility]) 2 MG/2ML injection     06/18/17 1806       Mardella Nuckles [place], MD 06/18/17 Virgina Jock, MD 06/26/17 [phone]

## 2017-06-19 ENCOUNTER — Telehealth: Payer: Self-pay | Admitting: *Deleted

## 2017-06-19 NOTE — Telephone Encounter (Signed)
Attempted to call pt for scheduled appt in [facility] per dr's Daryll Drown and [place]. Lm for rtc, will continue trying to call pt

## 2017-11-21 ENCOUNTER — Emergency Department (HOSPITAL_COMMUNITY)
Admission: EM | Admit: 2017-11-21 | Discharge: 2017-11-21 | Disposition: A | Discharge status: Home / Self Care | Payer: Self-pay | Attending: Emergency Medicine | Admitting: Emergency Medicine

## 2017-11-21 ENCOUNTER — Encounter (HOSPITAL_COMMUNITY): Payer: Self-pay | Admitting: *Deleted

## 2017-11-21 DIAGNOSIS — Z8521 Personal history of malignant neoplasm of larynx: Secondary | ICD-10-CM | POA: Insufficient documentation

## 2017-11-21 DIAGNOSIS — F17210 Nicotine dependence, cigarettes, uncomplicated: Secondary | ICD-10-CM | POA: Insufficient documentation

## 2017-11-21 DIAGNOSIS — E039 Hypothyroidism, unspecified: Secondary | ICD-10-CM | POA: Insufficient documentation

## 2017-11-21 DIAGNOSIS — N644 Mastodynia: Secondary | ICD-10-CM | POA: Insufficient documentation

## 2017-11-21 DIAGNOSIS — Z79899 Other long term (current) drug therapy: Secondary | ICD-10-CM | POA: Insufficient documentation

## 2017-11-21 DIAGNOSIS — N62 Hypertrophy of breast: Secondary | ICD-10-CM

## 2017-11-21 LAB — COMPREHENSIVE METABOLIC PANEL
ALK PHOS: 50 U/L (ref 38–126)
ALT: 15 U/L — AB (ref 17–63)
ANION GAP: 8 (ref 5–15)
AST: 17 U/L (ref 15–41)
Albumin: 3.3 g/dL — ABNORMAL LOW (ref 3.5–5.0)
BILIRUBIN TOTAL: 0.6 mg/dL (ref 0.3–1.2)
BUN: 22 mg/dL — AB (ref 6–20)
CALCIUM: 9.1 mg/dL (ref 8.9–10.3)
CO2: 27 mmol/L (ref 22–32)
CREATININE: 0.9 mg/dL (ref 0.61–1.24)
Chloride: 106 mmol/L (ref 101–111)
GFR calc Af Amer: >60 mL/min (ref >60)
GFR calc non Af Amer: >60 mL/min (ref >60)
Glucose, Bld: 114 mg/dL — ABNORMAL HIGH (ref 65–99)
Potassium: 4.1 mmol/L (ref 3.5–5.1)
Sodium: 141 mmol/L (ref 135–145)
TOTAL PROTEIN: 5.5 g/dL — AB (ref 6.5–8.1)

## 2017-11-21 LAB — CBC
HCT: 38 % — ABNORMAL LOW (ref 39.0–52.0)
Hemoglobin: 12.8 g/dL — ABNORMAL LOW (ref 13.0–17.0)
MCH: 31.7 pg (ref 26.0–34.0)
MCHC: 33.7 g/dL (ref 30.0–36.0)
MCV: 94.1 fL (ref 78.0–100.0)
PLATELETS: 172 10*3/uL (ref 150–400)
RBC: 4.04 MIL/uL — ABNORMAL LOW (ref 4.22–5.81)
RDW: 13.9 % (ref 11.5–15.5)
WBC: 3.6 10*3/uL — AB (ref 4.0–10.5)

## 2017-11-21 LAB — T4, FREE: Free T4: 1.08 ng/dL (ref 0.82–1.77)

## 2017-11-21 LAB — TSH: TSH: 0.202 u[IU]/mL — ABNORMAL LOW (ref 0.350–4.500)

## 2017-11-21 NOTE — ED Provider Notes (Signed)
[place] EMERGENCY DEPARTMENT Provider Note   CSN: [id number] Arrival date & time: 11/21/17  1052  History   Chief Complaint Chief Complaint  Patient presents with  . Mass    HPI [name] is a 52 y.o. male past medical history of squamous cell carcinoma to the cord status post radiation, acquired hypothyroidism, prior opiate use, emphysema who presented to the ED with complaints of breast tenderness.  He notes a 2-week history of a fullness under bilateral nipples with associated tenderness.  Pain worsens with touching, no relieving factors noted.  He states the pain has progressively worsened, especially in the last 2 days.  He denies any discharge from his nipple, no redness.  Notes possible bluish discoloration yesterday but no skin changes presently.  Denies any lumps/swelling elsewhere.  Denies recent weight loss.  No change in his baseline hoarseness or swallowing function.  No family history of breast cancer, notes neck/throat cancer in himself and brother.  He is not currently using heroin or IV drugs-no use since ED visit in 05/2017, currently staying with a friend.   Past Medical History:  Diagnosis Date  . Headache(784.0)   . Heart murmur    child  . Hx of radiation therapy 05/19/13- 06/27/13   glottic larynx, right true vocal cord 6300 cGy 28 sessions  . Hypothyroidism   . Shortness of breath   . Squamous cell carcinoma of vocal cord ([place]) 09/11/2013  . Vocal cord cancer ([place]) 05/01/2013    Patient Active Problem List   Diagnosis Date Noted  . Polysubstance dependence including opioid type drug, episodic abuse, with delirium ([place]) 03/22/2017  . Shock ([place])   . Unresponsive   . Acute respiratory failure with hypoxemia ([place]) 03/19/2017  . Homelessness 10/25/2016  . Long term (current) use of opiate analgesic 10/11/2016  . Emphysema of lung ([place]) 12/18/2014  . Esophageal dysphagia 09/10/2014  . Tobacco use disorder 09/10/2014  . Routine  health maintenance 07/09/2014  . Hypothyroidism due to acquired atrophy of thyroid 06/12/2014  . GERD (gastroesophageal reflux disease) 06/12/2014  . Hx of radiation therapy   . 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 05/06/2013  . Hoarseness of voice 03/17/2013    Past Surgical History:  Procedure Laterality Date  . BALLOON DILATION N/A 12/30/2013   Procedure: BALLOON DILATION;  Surgeon: Garlan Fair, MD;  Location: [facility];  Service: Endoscopy;  Laterality: N/A;  . DIRECT LARYNGOSCOPY Right 04/24/2013   Procedure: DIRECT LARYNGOSCOPY and BIOPSY OF TONGUE;  Surgeon: Jerrell Belfast, MD;  Location: [facility];  Service: ENT;  Laterality: Right;  . ESOPHAGEAL MANOMETRY N/A 09/07/2014   Procedure: ESOPHAGEAL MANOMETRY (EM);  Surgeon: Garlan Fair, MD;  Location: [facility];  Service: Endoscopy;  Laterality: N/A;  . ESOPHAGOGASTRODUODENOSCOPY (EGD) WITH PROPOFOL N/A 12/30/2013   Procedure: ESOPHAGOGASTRODUODENOSCOPY (EGD) WITH PROPOFOL;  Surgeon: Garlan Fair, MD;  Location: [facility];  Service: Endoscopy;  Laterality: N/A;  . MICROLARYNGOSCOPY Right 04/24/2013   Procedure: MICROLARYNGOSCOPY and RIGHT VOCAL CORD BIOPSY;  Surgeon: Jerrell Belfast, MD;  Location: [facility];  Service: ENT;  Laterality: Right;  . MULTIPLE EXTRACTIONS WITH ALVEOLOPLASTY N/A 09/30/2013   Procedure: Extraction of tooth #'s 2,3,4,5,6,7,8,9,10,11,12,15,22,23,24,25,26,27,28 with alveoloplasty;  Surgeon: Lenn Cal, DDS;  Location: [facility];  Service: Oral Surgery;  Laterality: N/A;  . TONSILLECTOMY     as a child  . wisdon teeth     age 39  Home Medications    Prior to Admission medications   Medication Sig Start Date End Date Taking? Authorizing Provider  levothyroxine (SYNTHROID, LEVOTHROID) 175 MCG tablet Take 1 tablet (175 mcg total) daily before breakfast by mouth. Void previous prescription on file for levothyroxine 06/05/17  Yes Kathi Ludwig, MD    clindamycin (CLEOCIN) 150 MG capsule Take 2 capsules (300 mg total) by mouth 3 (three) times daily. May dispense as 169m capsules Patient not taking: Reported on 11/21/2017 04/23/17   MNoemi Chapel MD  hydrOXYzine (ATARAX/VISTARIL) 25 MG tablet Take 1 tablet (25 mg total) every 6 (six) hours as needed by mouth for anxiety. Patient not taking: Reported on 11/21/2017 06/05/17   HKathi Ludwig MD  ibuprofen (ADVIL,MOTRIN) 800 MG tablet Take 1 tablet (800 mg total) by mouth 3 (three) times daily. Patient not taking: Reported on 11/21/2017 04/23/17   MNoemi Chapel MD  naloxone ([facility] 2 MG/2ML injection LMa Ringskit 06/18/17   Page, Nathan CMali MD    Family History Family History  Problem Relation Age of Onset  . COPD Mother   . Emphysema Mother   . Heart attack Father     Social History Social History   Tobacco Use  . Smoking status: Current Every Day Smoker    Packs/day: 0.20    Years: 34.00    Pack years: 6.80    Types: Cigarettes  . Smokeless tobacco: Never Used  . Tobacco comment: DOWN TO 4 - 5 CIGARETTES A DAY  Substance Use Topics  . Alcohol use: No    Alcohol/week: 0.0 oz  . Drug use: Yes    Comment: Heroin in past     Allergies   Fentanyl; Citalopram; and Morphine and related   Review of Systems Review of Systems  Constitutional: Negative for appetite change, fever and unexpected weight change.  HENT:       Chronic hoarseness   Skin: Negative for color change and rash.  Hematological: Negative for adenopathy.     Physical Exam Updated Vital Signs BP 113/74 (BP Location: Right Arm)   Pulse 69   Temp 98 F (36.7 C) (Oral)   Resp 20   SpO2 96%   General: Thin male, resting in bed comfortably, no acute distress HEENT: Hair loss over anterior proximal neck, no cervical lymphadenopathy, moist mucus membranes  CV: RRR, no murmur appreciated  Breast: Circumcised area of swelling under bilateral areola with R>L in size, centrally located with associated  TTP, no nipple discharge, no overlying skin changes Resp: Clear breath sounds bilaterally, normal work of breathing, no distress  Abd: Soft, +BS, nontender  Extr: No lower extremity edema Neuro: Alert and oriented x3  Skin: Warm, dry      ED Treatments / Results  Labs (all labs ordered are listed, but only abnormal results are displayed) Labs Reviewed  COMPREHENSIVE METABOLIC PANEL  TSH  T4, FREE  CBC    EKG None  Radiology No results found.  Medications Ordered in ED Medications - No data to display   Initial Impression / Assessment and Plan / ED Course  I have reviewed the triage vital signs and the nursing notes.  Pertinent labs & imaging results that were available during my care of the patient were reviewed by me and considered in my medical decision making (see chart for details).  Patient with a history of squamous cell cancer to full-court status post radiation coming in with 2-week history of breast tenderness with bilateral well-circumscribed glandular tissue beneath the areola, no  skin changes to suggest cellulitis or abscess. Exam and bilaterality also reassuring for infectious process or malignancy (either primary or spread).  Feel this is most consistent with gynecomastia which may be related to hyperthyroidism if his dose is too high, or a hypogonadism.  LFTs reassuring for cirrhosis as etiology, no other signs of liver disease.  TSH and free T4 pending at time of discharge but follow-up appointment made in [facility] clinic for dose adjustment or further work-up as indicated.  Case discussed with Dr. Leonette Monarch  Final Clinical Impressions(s) / ED Diagnoses   Final diagnoses:  None    ED Discharge Orders    None       Tawny Asal, MD 11/21/17 1505

## 2017-11-21 NOTE — ED Provider Notes (Signed)
I have personally seen and examined the patient. I have reviewed the documentation on PMH/FH/Soc Hx. I have discussed the plan of care with the resident and patient.  I have reviewed and agree with the resident's documentation. Please see associated encounter note.  Briefly, the patient is a 52 y.o. male here with bilateral areolar sensitivity and concern for postareolar mass. No fevers or chilss. No discharge. No erythema or induration on exam concerning for infection. Likely early gynecomastia from thyroid or liver disease. Labs reassuring. Close PCP follow up recommended.   EKG Interpretation None         Cardama, Grayce Sessions, MD 11/22/17 1327

## 2017-11-21 NOTE — ED Notes (Signed)
Pt verbalized understanding of discharge instructions. Armband removed by staff. Pt ambulatory to home

## 2017-11-21 NOTE — ED Triage Notes (Addendum)
Pt in c/o lumps to his chest that are painful, no visible abscess, denies discharge from nipples, hx of cancer to his vocal cords that he states is ongoing- also reports increased hoarseness in his voice over the last few months, pt has not seen his oncologist recently

## 2017-11-21 NOTE — Discharge Instructions (Addendum)
Your tenderness under your nipples appears to be some growth of breath tissue that we typically don't see in men, but it is less likely to be cancer or a dangerous process. We checked some initial labs that look [name]. I made an appointment in the [facility] clinic for 5/6 at 10:15 am to see if any further work up or adjustment of your thyroid medication needs to be done.

## 2017-11-26 ENCOUNTER — Ambulatory Visit: Payer: Self-pay

## 2017-11-26 ENCOUNTER — Encounter: Payer: Self-pay | Admitting: Internal Medicine

## 2018-02-14 ENCOUNTER — Ambulatory Visit: Payer: Self-pay

## 2018-02-18 ENCOUNTER — Ambulatory Visit: Payer: Self-pay

## 2018-02-18 ENCOUNTER — Encounter: Payer: Self-pay | Admitting: Internal Medicine

## 2018-02-24 ENCOUNTER — Emergency Department (HOSPITAL_COMMUNITY)
Admission: EM | Admit: 2018-02-24 | Discharge: 2018-02-24 | Disposition: A | Discharge status: Home / Self Care | Payer: Self-pay | Attending: Emergency Medicine | Admitting: Emergency Medicine

## 2018-02-24 ENCOUNTER — Encounter (HOSPITAL_COMMUNITY): Payer: Self-pay

## 2018-02-24 ENCOUNTER — Other Ambulatory Visit: Payer: Self-pay

## 2018-02-24 DIAGNOSIS — E039 Hypothyroidism, unspecified: Secondary | ICD-10-CM | POA: Insufficient documentation

## 2018-02-24 DIAGNOSIS — T401X1A Poisoning by heroin, accidental (unintentional), initial encounter: Secondary | ICD-10-CM | POA: Insufficient documentation

## 2018-02-24 DIAGNOSIS — F17210 Nicotine dependence, cigarettes, uncomplicated: Secondary | ICD-10-CM | POA: Insufficient documentation

## 2018-02-24 DIAGNOSIS — Z79899 Other long term (current) drug therapy: Secondary | ICD-10-CM | POA: Insufficient documentation

## 2018-02-24 LAB — COMPREHENSIVE METABOLIC PANEL
ALT: 25 U/L (ref 0–44)
ANION GAP: 15 (ref 5–15)
AST: 56 U/L — ABNORMAL HIGH (ref 15–41)
Albumin: 4.2 g/dL (ref 3.5–5.0)
Alkaline Phosphatase: 51 U/L (ref 38–126)
BUN: 26 mg/dL — ABNORMAL HIGH (ref 6–20)
CO2: 23 mmol/L (ref 22–32)
Calcium: 9.9 mg/dL (ref 8.9–10.3)
Chloride: 102 mmol/L (ref 98–111)
Creatinine, Ser: 1.57 mg/dL — ABNORMAL HIGH (ref 0.61–1.24)
GFR calc Af Amer: 56 mL/min — ABNORMAL LOW (ref >60)
GFR calc non Af Amer: 49 mL/min — ABNORMAL LOW (ref >60)
Glucose, Bld: 114 mg/dL — ABNORMAL HIGH (ref 70–99)
POTASSIUM: 5 mmol/L (ref 3.5–5.1)
Sodium: 140 mmol/L (ref 135–145)
Total Bilirubin: 1 mg/dL (ref 0.3–1.2)
Total Protein: 6.8 g/dL (ref 6.5–8.1)

## 2018-02-24 LAB — CBC
HCT: 44.1 % (ref 39.0–52.0)
Hemoglobin: 14.1 g/dL (ref 13.0–17.0)
MCH: 30.8 pg (ref 26.0–34.0)
MCHC: 32 g/dL (ref 30.0–36.0)
MCV: 96.3 fL (ref 78.0–100.0)
Platelets: 196 10*3/uL (ref 150–400)
RBC: 4.58 MIL/uL (ref 4.22–5.81)
RDW: 14.6 % (ref 11.5–15.5)
WBC: 8.7 10*3/uL (ref 4.0–10.5)

## 2018-02-24 LAB — SALICYLATE LEVEL: Salicylate Lvl: <7 mg/dL (ref 2.8–30.0)

## 2018-02-24 LAB — ACETAMINOPHEN LEVEL

## 2018-02-24 LAB — ETHANOL: Alcohol, Ethyl (B): <10 mg/dL (ref <10)

## 2018-02-24 MED ORDER — LORAZEPAM 2 MG/ML IJ SOLN
2.0000 mg | Freq: Once | INTRAMUSCULAR | Status: AC
  Administered 2018-02-24: 2 mg via INTRAVENOUS
  Filled 2018-02-24: qty 1

## 2018-02-24 MED ORDER — SODIUM CHLORIDE 0.9 % IV BOLUS
1000.0000 mL | Freq: Once | INTRAVENOUS | Status: AC
  Administered 2018-02-24: 1000 mL via INTRAVENOUS

## 2018-02-24 MED ORDER — AMMONIA AROMATIC IN INHA
RESPIRATORY_TRACT | Status: AC
  Filled 2018-02-24: qty 10

## 2018-02-24 NOTE — ED Notes (Signed)
Attempted in and out cath, unsuccessful d/t prostate enlargement.

## 2018-02-24 NOTE — Discharge Instructions (Addendum)
Follow-up with your primary doctor for any additional issues.

## 2018-02-24 NOTE — ED Provider Notes (Signed)
[facility] EMERGENCY DEPARTMENT Provider Note   CSN: [id number] Arrival date & time: 02/24/18  0000     History   Chief Complaint Chief Complaint  Patient presents with  . Drug Overdose    HPI [name] is a 52 y.o. male.  Patient is a 52 year old male with history of polysubstance abuse, laryngeal cancer, and hypothyroidism.  He presents for evaluation of heroin overdose.  He reports snorting heroin this evening and began to feel badly and took his Narcan.  EMS was called and the patient was found to be agitated, shaking, and requiring restraints.  He received Haldol prior to arrival.  He reports to me that his legs are "cramping", and that he just feels generally unwell.  The history is provided by the patient.  Drug Overdose  This is a new problem. The current episode started less than 1 hour ago. The problem occurs constantly. The problem has not changed since onset.Pertinent negatives include no chest pain and no shortness of breath. Nothing aggravates the symptoms. Nothing relieves the symptoms. Treatments tried: narcan. The treatment provided no relief.    Past Medical History:  Diagnosis Date  . Headache(784.0)   . Heart murmur    child  . Hx of radiation therapy 05/19/13- 06/27/13   glottic larynx, right true vocal cord 6300 cGy 28 sessions  . Hypothyroidism   . Shortness of breath   . 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 Polysubstance dependence including opioid type drug, episodic abuse, with delirium ([place]) 03/22/2017  . Shock ([place])   . Unresponsive   . Acute respiratory failure with hypoxemia ([place]) 03/19/2017  . Homelessness 10/25/2016  . Long term (current) use of opiate analgesic 10/11/2016  . Emphysema of lung ([place]) 12/18/2014  . Esophageal dysphagia 09/10/2014  . Tobacco use disorder 09/10/2014  . Routine health  maintenance 07/09/2014  . Hypothyroidism due to acquired atrophy of thyroid 06/12/2014  . GERD (gastroesophageal reflux disease) 06/12/2014  . Hx of radiation therapy   . 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 05/06/2013  . Hoarseness of voice 03/17/2013    Past Surgical History:  Procedure Laterality Date  . BALLOON DILATION N/A 12/30/2013   Procedure: BALLOON DILATION;  Surgeon: Garlan Fair, MD;  Location: [facility];  Service: Endoscopy;  Laterality: N/A;  . DIRECT LARYNGOSCOPY Right 04/24/2013   Procedure: DIRECT LARYNGOSCOPY and BIOPSY OF TONGUE;  Surgeon: Jerrell Belfast, MD;  Location: [facility];  Service: ENT;  Laterality: Right;  . ESOPHAGEAL MANOMETRY N/A 09/07/2014   Procedure: ESOPHAGEAL MANOMETRY (EM);  Surgeon: Garlan Fair, MD;  Location: [facility];  Service: Endoscopy;  Laterality: N/A;  . ESOPHAGOGASTRODUODENOSCOPY (EGD) WITH PROPOFOL N/A 12/30/2013   Procedure: ESOPHAGOGASTRODUODENOSCOPY (EGD) WITH PROPOFOL;  Surgeon: Garlan Fair, MD;  Location: [facility];  Service: Endoscopy;  Laterality: N/A;  . MICROLARYNGOSCOPY Right 04/24/2013   Procedure: MICROLARYNGOSCOPY and RIGHT VOCAL CORD BIOPSY;  Surgeon: Jerrell Belfast, MD;  Location: [facility];  Service: ENT;  Laterality: Right;  . MULTIPLE EXTRACTIONS WITH ALVEOLOPLASTY N/A 09/30/2013   Procedure: Extraction of tooth #'s 2,3,4,5,6,7,8,9,10,11,12,15,22,23,24,25,26,27,28 with alveoloplasty;  Surgeon: Lenn Cal, DDS;  Location: [facility];  Service: Oral Surgery;  Laterality: N/A;  . TONSILLECTOMY     as a child  . wisdon teeth     age 5  Home Medications    Prior to Admission medications   Medication Sig Start Date End Date Taking? Authorizing Provider  levothyroxine (SYNTHROID, LEVOTHROID) 175 MCG tablet Take 1 tablet (175 mcg total) daily before breakfast by mouth. Void previous prescription on file for levothyroxine 06/05/17   Kathi Ludwig, MD  naloxone  [facility]) 2 MG/2ML injection Ma Rings kit 06/18/17   Page, Nathan Mali, MD    Family History Family History  Problem Relation Age of Onset  . COPD Mother   . Emphysema Mother   . Heart attack Father     Social History Social History   Tobacco Use  . Smoking status: Current Every Day Smoker    Packs/day: 0.20    Years: 34.00    Pack years: 6.80    Types: Cigarettes  . Smokeless tobacco: Never Used  . Tobacco comment: DOWN TO 4 - 5 CIGARETTES A DAY  Substance Use Topics  . Alcohol use: No    Alcohol/week: 0.0 oz  . Drug use: Yes    Comment: Heroin     Allergies   Fentanyl; Citalopram; and Morphine and related   Review of Systems Review of Systems  Respiratory: Negative for shortness of breath.   Cardiovascular: Negative for chest pain.     Physical Exam Updated Vital Signs BP (!) 157/91 (BP Location: Right Arm)   Pulse (!) 116   Temp (!) 100.9 F (38.3 C) (Axillary)   Resp (!) 32   SpO2 99%   Physical Exam   ED Treatments / Results  Labs (all labs ordered are listed, but only abnormal results are displayed) Labs Reviewed  COMPREHENSIVE METABOLIC PANEL  ETHANOL  SALICYLATE LEVEL  ACETAMINOPHEN LEVEL  CBC  RAPID URINE DRUG SCREEN, HOSP PERFORMED  CBG MONITORING, ED    EKG EKG Interpretation  Date/Time:  Sunday February 24 2018 00:17:58 EDT Ventricular Rate:  108 PR Interval:    QRS Duration: 88 QT Interval:  382 QTC Calculation: 510 R Axis:   85 Text Interpretation:  Sinus tachycardia Baseline wander in lead(s) II III aVF Confirmed by Veryl Speak [phone]) on 02/24/2018 12:22:27 AM   Radiology No results found.  Procedures Procedures (including critical care time)  Medications Ordered in ED Medications  sodium chloride 0.9 % bolus 1,000 mL (has no administration in time range)  LORazepam (ATIVAN) injection 2 mg (has no administration in time range)     Initial Impression / Assessment and Plan / ED Course  I have reviewed the triage  vital signs and the nursing notes.  Pertinent labs & imaging results that were available during my care of the patient were reviewed by me and considered in my medical decision making (see chart for details).  Patient brought here by EMS after a heroin overdose.  He had given himself Narcan prior to coming in.  He was originally agitated and combative and had defecated on himself.  He was given IV Ativan and laboratory studies were obtained.  Everything for the most part is unremarkable.  He was allowed to rest in the department overnight and is now waking up and feeling better.  He initially had a low-grade fever, the etiology of which I am uncertain, however I find nothing infectious on his exam and there is no white count.  At this point I feel as though he is appropriate for discharge.  Final Clinical Impressions(s) / ED Diagnoses   Final diagnoses:  None    ED Discharge Orders    None  Veryl Speak, MD 02/24/18 0700

## 2018-02-24 NOTE — ED Triage Notes (Signed)
Per [facility], pt arrives due to c/o "I took heroin and I need narcan." Pt reports snorting heroin. Pt spitting up brown emesis. Pt shaking, defecated on self, pt kicking and EMS restrained arms. EMS restraints removed on arrival. EMS gave 5 of haldol.

## 2018-02-24 NOTE — ED Notes (Signed)
Prompted patient to provide urine sample.

## 2018-04-16 ENCOUNTER — Ambulatory Visit (HOSPITAL_COMMUNITY)
Admission: RE | Admit: 2018-04-16 | Discharge: 2018-04-16 | Disposition: A | Discharge status: Home / Self Care | Payer: Self-pay | Source: Ambulatory Visit | Attending: Internal Medicine | Admitting: Internal Medicine

## 2018-04-16 ENCOUNTER — Ambulatory Visit (INDEPENDENT_AMBULATORY_CARE_PROVIDER_SITE_OTHER): Payer: Self-pay | Admitting: Internal Medicine

## 2018-04-16 ENCOUNTER — Other Ambulatory Visit: Payer: Self-pay

## 2018-04-16 VITALS — BP 109/72 | HR 66 | Temp 97.5°F | Wt 158.9 lb

## 2018-04-16 DIAGNOSIS — Z923 Personal history of irradiation: Secondary | ICD-10-CM

## 2018-04-16 DIAGNOSIS — Z79890 Hormone replacement therapy (postmenopausal): Secondary | ICD-10-CM

## 2018-04-16 DIAGNOSIS — R0602 Shortness of breath: Secondary | ICD-10-CM | POA: Insufficient documentation

## 2018-04-16 DIAGNOSIS — E034 Atrophy of thyroid (acquired): Secondary | ICD-10-CM

## 2018-04-16 DIAGNOSIS — F1120 Opioid dependence, uncomplicated: Secondary | ICD-10-CM

## 2018-04-16 DIAGNOSIS — R5383 Other fatigue: Secondary | ICD-10-CM | POA: Insufficient documentation

## 2018-04-16 DIAGNOSIS — R5381 Other malaise: Secondary | ICD-10-CM

## 2018-04-16 DIAGNOSIS — Z8521 Personal history of malignant neoplasm of larynx: Secondary | ICD-10-CM

## 2018-04-16 DIAGNOSIS — R490 Dysphonia: Secondary | ICD-10-CM

## 2018-04-16 DIAGNOSIS — F11221 Opioid dependence with intoxication delirium: Secondary | ICD-10-CM

## 2018-04-16 DIAGNOSIS — Z8589 Personal history of malignant neoplasm of other organs and systems: Secondary | ICD-10-CM

## 2018-04-16 LAB — BRAIN NATRIURETIC PEPTIDE: B Natriuretic Peptide: 11.7 pg/mL (ref 0.0–100.0)

## 2018-04-16 MED ORDER — LEVOTHYROXINE SODIUM 175 MCG PO TABS: 175.0000 ug | ORAL_TABLET | Freq: Every day | ORAL | 0 refills | Status: DC

## 2018-04-16 MED FILL — LEVOTHYROXINE 175 MCG TABLE: 175 | 30 days supply | Qty: 30 | Fill #0

## 2018-04-16 NOTE — Assessment & Plan Note (Signed)
His symptoms of worsening fatigue, hoarseness and dysphagia with his past medical history of squamous cell carcinoma of vocal cord treated with radiation in the past needs further evaluation.  Also has an history of hypothyroidism and not taking his Synthroid for more than a month now, his symptoms might be due to hypothyroidism.  Patient's daughter called her radiation oncologist and otolaryngologist and find out that he will need a another referral to see them again.  -Referral to see his otolaryngologist was provided. -Might need another referral to see a gastroenterologist for worsening dysphagia. -We will check basic labs which include CBC, HIV, CMP, TSH and chest x-ray

## 2018-04-16 NOTE — Patient Instructions (Signed)
Thank you for visiting clinic today. We will check some labs and do a chest x-ray today. We will call you with any abnormal results. I am sending a new prescription of your thyroid medicine to [facility] outpatient pharmacy, please start taking that. Please try getting Medicaid, as that will help you go to see a different specialist. Please follow-up in 1 week.

## 2018-04-16 NOTE — Assessment & Plan Note (Signed)
His complaint of worsening exertional dyspnea and lower extremity edema can be due to some heart failure. Echo done a year ago was normal. BNP done today was within normal limits. Chest x-ray done today was without any vascular congestion.  Lower extremity edema appears dependent as it get worse as he stays on his feet. There was no facial or neck edema today. Also advised him to keep his legs elevated while resting and he can also try using compression stockings.

## 2018-04-16 NOTE — Progress Notes (Addendum)
CC: Worsening hoarseness, difficulty swallowing and lower extremity edema.  HPI:  [name] is a 52 y.o. with past medical history as listed below came to the clinic with complaint of worsening hoarseness, difficulty with swallowing, occasionally vomit regurgitating food and bilateral lower extremity edema for the past few weeks.  Patient is experiencing bilateral lower extremity edema towards the end of the day, his lower extremity edema improves after sleeping the whole night, but most of the days he started getting swelling around her face and neck in the morning which improves during the day. He was complaining of dizziness with postural change and exertional dyspnea.  Denies any chest pain, orthopnea or PND. He was also complaining of feeling cold all the time. He also has an history of hypothyroidism, apparently he was asked to stop all his medications when he was seen in ED for heroin overdose.  Currently he is not taking any medicine.  In the ED in May 2019 and then in August 2019 and was not sure when did he stopped taking his Synthroid. According to patient he stopped  Using heroin for the past 1 month and now going to methadone clinic.  His only medicine at this time his methadone. According to patient he did had some esophageal strictures in the past requiring dilatation and his symptoms are similar to those symptoms when he had esophageal strictures. He denies any recent change in his appetite or weight. He was complaining of being fatigued and lethargic most of the time.  Please see assessment and plan for his conditions.  Addendum.  Patient has high TSH at 118.6 and low free T4 at 0.11, which is consistent with his hypothyroidism.  Patient was not taking his Synthroid for more than a month now which was restarted yesterday.  Called the patient and talked with him and his daughter, patient has not picked up his prescription yet.  Discussed the results and advised him to  start taking his Synthroid as soon as possible and we will repeat TSH in 4 to 6 weeks.  Past Medical History:  Diagnosis Date  . Headache(784.0)   . Heart murmur    child  . Hx of radiation therapy 05/19/13- 06/27/13   glottic larynx, right true vocal cord 6300 cGy 28 sessions  . Hypothyroidism   . Shortness of breath   . 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  Physical Exam:  Vitals:   04/16/18 0931  BP: 109/72  Pulse: 66  Temp: (!) 97.5 F (36.4 C)  TempSrc: Oral  SpO2: 99%  Weight: 158 lb 14.4 oz (72.1 kg)    General: Vital signs reviewed.  Patient is well-developed and well-nourished, in no acute distress and cooperative with exam.  Head: Normocephalic and atraumatic. Eyes: EOMI, conjunctivae normal, no scleral icterus.  Neck: Supple, trachea midline, normal ROM, no JVD, masses, thyromegaly, or carotid bruit present.  Cardiovascular: RRR, S1 normal, S2 normal, no murmurs, gallops, or rubs. Pulmonary/Chest: Mildly decreased breath sounds at bases, no rhonchi or wheeze. Abdominal: Soft, non-tender, non-distended, BS +,  Extremities: 1-2+ lower extremity edema bilaterally,  pulses symmetric and intact bilaterally. Neurological: A&O x3, Strength is normal and symmetric bilaterally, cranial nerve II-XII are grossly intact, no focal motor deficit, sensory intact to light touch bilaterally.  Skin:  dry and intact. No rashes or erythema. Psychiatric: Normal mood and affect. speech and behavior is  normal. Cognition and memory are normal.  Assessment & Plan:   See Encounters Tab for problem based charting.  Patient discussed with Dr. Rebeca Alert.

## 2018-04-16 NOTE — Assessment & Plan Note (Signed)
Patient with history of polysubstance and heroin abuse, last ED visit with heroin overdose was in August 2019 requiring Narcan.  According to patient he stopped using heroin and now going to methadone clinic and is compliant with his methadone.  Because of his complaint of generalized malaise and fatigue we will check him for HIV.

## 2018-04-16 NOTE — Assessment & Plan Note (Signed)
Patient has an history of vocal cord carcinoma and worsening hoarseness of voice needs further evaluation.  Referral to see his laryngologist was provided.

## 2018-04-16 NOTE — Assessment & Plan Note (Signed)
Patient was on Synthroid 175 MCG, when told that he should not be taking any of those medicine.  I could not find any documentation, may be he understood wrong when he visited ED for heroin overdose. Not sure why he stopped taking and who asked him to do so. He was mildly hypothermic and feeling excessively cold today.  We will repeat TSH and free T4. -Restarted him on his previous dose of Synthroid 175 MCG daily.

## 2018-04-17 ENCOUNTER — Encounter: Payer: Self-pay | Admitting: *Deleted

## 2018-04-17 LAB — CBC WITH DIFFERENTIAL/PLATELET
BASOS ABS: 0 10*3/uL (ref 0.0–0.2)
Basos: 1 %
EOS (ABSOLUTE): 0.9 10*3/uL — AB (ref 0.0–0.4)
Eos: 19 %
HEMATOCRIT: 31.8 % — AB (ref 37.5–51.0)
HEMOGLOBIN: 10.8 g/dL — AB (ref 13.0–17.7)
IMMATURE GRANS (ABS): 0 10*3/uL (ref 0.0–0.1)
Immature Granulocytes: 0 %
LYMPHS: 23 %
Lymphocytes Absolute: 1.1 10*3/uL (ref 0.7–3.1)
MCH: 34.2 pg — AB (ref 26.6–33.0)
MCHC: 34 g/dL (ref 31.5–35.7)
MCV: 101 fL — AB (ref 79–97)
MONOCYTES: 6 %
Monocytes Absolute: 0.3 10*3/uL (ref 0.1–0.9)
NEUTROS ABS: 2.4 10*3/uL (ref 1.4–7.0)
NEUTROS PCT: 51 %
Platelets: 216 10*3/uL (ref 150–450)
RBC: 3.16 x10E6/uL — ABNORMAL LOW (ref 4.14–5.80)
RDW: 17.6 % — ABNORMAL HIGH (ref 12.3–15.4)
WBC: 4.7 10*3/uL (ref 3.4–10.8)

## 2018-04-17 LAB — CMP14 + ANION GAP
A/G RATIO: 2.3 — AB (ref 1.2–2.2)
ALK PHOS: 43 IU/L (ref 39–117)
ALT: 18 IU/L (ref 0–44)
AST: 33 IU/L (ref 0–40)
Albumin: 3.9 g/dL (ref 3.5–5.5)
Anion Gap: 14 mmol/L (ref 10.0–18.0)
BILIRUBIN TOTAL: 0.2 mg/dL (ref 0.0–1.2)
BUN/Creatinine Ratio: 15 (ref 9–20)
BUN: 22 mg/dL (ref 6–24)
CHLORIDE: 102 mmol/L (ref 96–106)
CO2: 28 mmol/L (ref 20–29)
Calcium: 8.8 mg/dL (ref 8.7–10.2)
Creatinine, Ser: 1.49 mg/dL — ABNORMAL HIGH (ref 0.76–1.27)
GFR calc non Af Amer: 52 mL/min/{1.73_m2} — ABNORMAL LOW (ref >59)
GFR, EST AFRICAN AMERICAN: 61 mL/min/{1.73_m2} (ref >59)
GLUCOSE: 85 mg/dL (ref 65–99)
Globulin, Total: 1.7 g/dL (ref 1.5–4.5)
POTASSIUM: 4.2 mmol/L (ref 3.5–5.2)
Sodium: 144 mmol/L (ref 134–144)
Total Protein: 5.6 g/dL — ABNORMAL LOW (ref 6.0–8.5)

## 2018-04-17 LAB — TSH: TSH: 118.6 u[IU]/mL — ABNORMAL HIGH (ref 0.450–4.500)

## 2018-04-17 LAB — URINALYSIS, ROUTINE W REFLEX MICROSCOPIC
BILIRUBIN UA: NEGATIVE
Glucose, UA: NEGATIVE
KETONES UA: NEGATIVE
Leukocytes, UA: NEGATIVE
Nitrite, UA: NEGATIVE
Protein, UA: NEGATIVE
RBC UA: NEGATIVE
SPEC GRAV UA: 1.025 (ref 1.005–1.030)
UUROB: 1 mg/dL (ref 0.2–1.0)
pH, UA: 5.5 (ref 5.0–7.5)

## 2018-04-17 LAB — T4, FREE

## 2018-04-17 LAB — HIV ANTIBODY (ROUTINE TESTING W REFLEX): HIV Screen 4th Generation wRfx: NONREACTIVE

## 2018-04-19 NOTE — Progress Notes (Signed)
Internal Medicine Clinic Attending  Case discussed with Dr. Amin at the time of the visit.  We reviewed the resident's history and exam and pertinent patient test results.  I agree with the assessment, diagnosis, and plan of care documented in the resident's note.  Alexander Raines, M.D., Ph.D.  

## 2018-04-23 ENCOUNTER — Encounter: Payer: Self-pay | Admitting: Internal Medicine

## 2018-04-23 ENCOUNTER — Ambulatory Visit: Payer: Self-pay

## 2018-04-26 ENCOUNTER — Encounter: Payer: Self-pay | Admitting: Internal Medicine

## 2018-05-16 NOTE — Addendum Note (Signed)
Addended by: Hulan Fray on: 05/16/2018 07:36 PM   Modules accepted: Orders

## 2018-05-21 ENCOUNTER — Ambulatory Visit: Payer: Self-pay

## 2018-08-01 ENCOUNTER — Emergency Department (HOSPITAL_COMMUNITY): Payer: Self-pay

## 2018-08-01 ENCOUNTER — Inpatient Hospital Stay (HOSPITAL_COMMUNITY)
Admission: EM | Admit: 2018-08-01 | Discharge: 2018-08-05 | DRG: 871 | Disposition: A | Discharge status: Home / Self Care | Payer: Self-pay | Attending: Internal Medicine | Admitting: Internal Medicine

## 2018-08-01 ENCOUNTER — Encounter (HOSPITAL_COMMUNITY): Payer: Self-pay | Admitting: Emergency Medicine

## 2018-08-01 DIAGNOSIS — Z681 Body mass index (BMI) 19 or less, adult: Secondary | ICD-10-CM

## 2018-08-01 DIAGNOSIS — F1410 Cocaine abuse, uncomplicated: Secondary | ICD-10-CM | POA: Diagnosis present

## 2018-08-01 DIAGNOSIS — Z825 Family history of asthma and other chronic lower respiratory diseases: Secondary | ICD-10-CM

## 2018-08-01 DIAGNOSIS — E034 Atrophy of thyroid (acquired): Secondary | ICD-10-CM

## 2018-08-01 DIAGNOSIS — J440 Chronic obstructive pulmonary disease with acute lower respiratory infection: Secondary | ICD-10-CM | POA: Diagnosis present

## 2018-08-01 DIAGNOSIS — Z79899 Other long term (current) drug therapy: Secondary | ICD-10-CM

## 2018-08-01 DIAGNOSIS — Z79890 Hormone replacement therapy (postmenopausal): Secondary | ICD-10-CM

## 2018-08-01 DIAGNOSIS — J101 Influenza due to other identified influenza virus with other respiratory manifestations: Secondary | ICD-10-CM

## 2018-08-01 DIAGNOSIS — T381X6A Underdosing of thyroid hormones and substitutes, initial encounter: Secondary | ICD-10-CM | POA: Diagnosis present

## 2018-08-01 DIAGNOSIS — F1910 Other psychoactive substance abuse, uncomplicated: Secondary | ICD-10-CM | POA: Diagnosis present

## 2018-08-01 DIAGNOSIS — F1490 Cocaine use, unspecified, uncomplicated: Secondary | ICD-10-CM

## 2018-08-01 DIAGNOSIS — J189 Pneumonia, unspecified organism: Secondary | ICD-10-CM | POA: Diagnosis present

## 2018-08-01 DIAGNOSIS — Z8521 Personal history of malignant neoplasm of larynx: Secondary | ICD-10-CM

## 2018-08-01 DIAGNOSIS — Z590 Homelessness: Secondary | ICD-10-CM

## 2018-08-01 DIAGNOSIS — Z91128 Patient's intentional underdosing of medication regimen for other reason: Secondary | ICD-10-CM

## 2018-08-01 DIAGNOSIS — E46 Unspecified protein-calorie malnutrition: Secondary | ICD-10-CM | POA: Diagnosis present

## 2018-08-01 DIAGNOSIS — K219 Gastro-esophageal reflux disease without esophagitis: Secondary | ICD-10-CM | POA: Diagnosis present

## 2018-08-01 DIAGNOSIS — R64 Cachexia: Secondary | ICD-10-CM | POA: Diagnosis present

## 2018-08-01 DIAGNOSIS — E039 Hypothyroidism, unspecified: Secondary | ICD-10-CM | POA: Diagnosis present

## 2018-08-01 DIAGNOSIS — Z923 Personal history of irradiation: Secondary | ICD-10-CM

## 2018-08-01 DIAGNOSIS — J1000 Influenza due to other identified influenza virus with unspecified type of pneumonia: Secondary | ICD-10-CM | POA: Diagnosis present

## 2018-08-01 DIAGNOSIS — T3695XA Adverse effect of unspecified systemic antibiotic, initial encounter: Secondary | ICD-10-CM | POA: Diagnosis present

## 2018-08-01 DIAGNOSIS — F17200 Nicotine dependence, unspecified, uncomplicated: Secondary | ICD-10-CM | POA: Diagnosis present

## 2018-08-01 DIAGNOSIS — Z7151 Drug abuse counseling and surveillance of drug abuser: Secondary | ICD-10-CM

## 2018-08-01 DIAGNOSIS — J441 Chronic obstructive pulmonary disease with (acute) exacerbation: Secondary | ICD-10-CM | POA: Diagnosis present

## 2018-08-01 DIAGNOSIS — Z716 Tobacco abuse counseling: Secondary | ICD-10-CM

## 2018-08-01 DIAGNOSIS — N179 Acute kidney failure, unspecified: Secondary | ICD-10-CM | POA: Diagnosis present

## 2018-08-01 DIAGNOSIS — K521 Toxic gastroenteritis and colitis: Secondary | ICD-10-CM | POA: Diagnosis not present

## 2018-08-01 DIAGNOSIS — Z885 Allergy status to narcotic agent status: Secondary | ICD-10-CM

## 2018-08-01 DIAGNOSIS — J181 Lobar pneumonia, unspecified organism: Secondary | ICD-10-CM

## 2018-08-01 DIAGNOSIS — Z8249 Family history of ischemic heart disease and other diseases of the circulatory system: Secondary | ICD-10-CM

## 2018-08-01 DIAGNOSIS — F1210 Cannabis abuse, uncomplicated: Secondary | ICD-10-CM | POA: Diagnosis present

## 2018-08-01 DIAGNOSIS — Z888 Allergy status to other drugs, medicaments and biological substances status: Secondary | ICD-10-CM

## 2018-08-01 DIAGNOSIS — A419 Sepsis, unspecified organism: Principal | ICD-10-CM | POA: Diagnosis present

## 2018-08-01 DIAGNOSIS — F17210 Nicotine dependence, cigarettes, uncomplicated: Secondary | ICD-10-CM | POA: Diagnosis present

## 2018-08-01 DIAGNOSIS — J329 Chronic sinusitis, unspecified: Secondary | ICD-10-CM | POA: Diagnosis present

## 2018-08-01 DIAGNOSIS — K224 Dyskinesia of esophagus: Secondary | ICD-10-CM | POA: Diagnosis present

## 2018-08-01 DIAGNOSIS — E860 Dehydration: Secondary | ICD-10-CM | POA: Diagnosis present

## 2018-08-01 DIAGNOSIS — R6520 Severe sepsis without septic shock: Secondary | ICD-10-CM | POA: Diagnosis present

## 2018-08-01 DIAGNOSIS — N183 Chronic kidney disease, stage 3 (moderate): Secondary | ICD-10-CM | POA: Diagnosis present

## 2018-08-01 HISTORY — DX: Influenza due to other identified influenza virus with other respiratory manifestations: J10.1

## 2018-08-01 LAB — COMPREHENSIVE METABOLIC PANEL
ALK PHOS: 56 U/L (ref 38–126)
ALT: 23 U/L (ref 0–44)
AST: 47 U/L — ABNORMAL HIGH (ref 15–41)
Albumin: 4.4 g/dL (ref 3.5–5.0)
Anion gap: 19 — ABNORMAL HIGH (ref 5–15)
BUN: 67 mg/dL — ABNORMAL HIGH (ref 6–20)
CHLORIDE: 98 mmol/L (ref 98–111)
CO2: 19 mmol/L — AB (ref 22–32)
CREATININE: 4.88 mg/dL — AB (ref 0.61–1.24)
Calcium: 8 mg/dL — ABNORMAL LOW (ref 8.9–10.3)
GFR calc Af Amer: 14 mL/min — ABNORMAL LOW (ref >60)
GFR calc non Af Amer: 12 mL/min — ABNORMAL LOW (ref >60)
Glucose, Bld: 86 mg/dL (ref 70–99)
Potassium: 4.1 mmol/L (ref 3.5–5.1)
SODIUM: 136 mmol/L (ref 135–145)
Total Bilirubin: 0.5 mg/dL (ref 0.3–1.2)
Total Protein: 7.9 g/dL (ref 6.5–8.1)

## 2018-08-01 LAB — URINALYSIS, ROUTINE W REFLEX MICROSCOPIC
BILIRUBIN URINE: NEGATIVE
Glucose, UA: NEGATIVE mg/dL
Ketones, ur: NEGATIVE mg/dL
Leukocytes, UA: NEGATIVE
NITRITE: NEGATIVE
Protein, ur: 100 mg/dL — AB
SPECIFIC GRAVITY, URINE: 1.014 (ref 1.005–1.030)
pH: 5 (ref 5.0–8.0)

## 2018-08-01 LAB — POC OCCULT BLOOD, ED: Fecal Occult Bld: POSITIVE — AB

## 2018-08-01 LAB — INFLUENZA PANEL BY PCR (TYPE A & B)
Influenza A By PCR: NEGATIVE
Influenza B By PCR: POSITIVE — AB

## 2018-08-01 LAB — CBC WITH DIFFERENTIAL/PLATELET
Abs Immature Granulocytes: 0.06 10*3/uL (ref 0.00–0.07)
Basophils Absolute: 0 10*3/uL (ref 0.0–0.1)
Basophils Relative: 0 %
EOS PCT: 0 %
Eosinophils Absolute: 0 10*3/uL (ref 0.0–0.5)
HEMATOCRIT: 55.9 % — AB (ref 39.0–52.0)
HEMOGLOBIN: 18.4 g/dL — AB (ref 13.0–17.0)
Immature Granulocytes: 1 %
LYMPHS ABS: 0.5 10*3/uL — AB (ref 0.7–4.0)
LYMPHS PCT: 5 %
MCH: 32.7 pg (ref 26.0–34.0)
MCHC: 32.9 g/dL (ref 30.0–36.0)
MCV: 99.5 fL (ref 80.0–100.0)
MONOS PCT: 4 %
Monocytes Absolute: 0.4 10*3/uL (ref 0.1–1.0)
Neutro Abs: 9 10*3/uL — ABNORMAL HIGH (ref 1.7–7.7)
Neutrophils Relative %: 90 %
Platelets: 151 10*3/uL (ref 150–400)
RBC: 5.62 MIL/uL (ref 4.22–5.81)
RDW: 13.3 % (ref 11.5–15.5)
WBC: 10 10*3/uL (ref 4.0–10.5)
nRBC: 0 % (ref 0.0–0.2)

## 2018-08-01 LAB — MRSA PCR SCREENING: MRSA by PCR: NEGATIVE

## 2018-08-01 LAB — RAPID URINE DRUG SCREEN, HOSP PERFORMED
Amphetamines: NOT DETECTED
Barbiturates: NOT DETECTED
Benzodiazepines: NOT DETECTED
Cocaine: POSITIVE — AB
Opiates: NOT DETECTED
Tetrahydrocannabinol: POSITIVE — AB

## 2018-08-01 LAB — CK: Total CK: 220 U/L (ref 49–397)

## 2018-08-01 LAB — I-STAT CG4 LACTIC ACID, ED
Lactic Acid, Venous: 2.45 mmol/L (ref 0.5–1.9)
Lactic Acid, Venous: 1.5 mmol/L (ref 0.5–1.9)

## 2018-08-01 LAB — LIPASE, BLOOD: LIPASE: 77 U/L — AB (ref 11–51)

## 2018-08-01 LAB — I-STAT TROPONIN, ED: TROPONIN I, POC: 0.07 ng/mL (ref 0.00–0.08)

## 2018-08-01 MED ORDER — METRONIDAZOLE IN NACL 5-0.79 MG/ML-% IV SOLN
500.0000 mg | Freq: Three times a day (TID) | INTRAVENOUS | Status: DC
  Administered 2018-08-01: 500 mg via INTRAVENOUS
  Filled 2018-08-01: qty 100

## 2018-08-01 MED ORDER — ONDANSETRON HCL 4 MG/2ML IJ SOLN: 4.0000 mg | Freq: Once | INTRAMUSCULAR | Status: DC

## 2018-08-01 MED ORDER — ONDANSETRON HCL 4 MG PO TABS: 4.0000 mg | ORAL_TABLET | Freq: Four times a day (QID) | ORAL | Status: DC | PRN

## 2018-08-01 MED ORDER — ONDANSETRON HCL 4 MG/2ML IJ SOLN
4.0000 mg | Freq: Once | INTRAMUSCULAR | Status: AC
  Administered 2018-08-01: 4 mg via INTRAVENOUS
  Filled 2018-08-01: qty 2

## 2018-08-01 MED ORDER — VANCOMYCIN HCL IN DEXTROSE 1-5 GM/200ML-% IV SOLN
1000.0000 mg | Freq: Once | INTRAVENOUS | Status: AC
  Administered 2018-08-01: 1000 mg via INTRAVENOUS
  Filled 2018-08-01: qty 200

## 2018-08-01 MED ORDER — SODIUM CHLORIDE 0.9 % IV SOLN
INTRAVENOUS | Status: DC
  Administered 2018-08-01 – 2018-08-03 (×4): via INTRAVENOUS

## 2018-08-01 MED ORDER — SODIUM CHLORIDE 0.9 % IV BOLUS
500.0000 mL | Freq: Once | INTRAVENOUS | Status: AC
  Administered 2018-08-01: 500 mL via INTRAVENOUS

## 2018-08-01 MED ORDER — ALBUTEROL SULFATE (2.5 MG/3ML) 0.083% IN NEBU: 2.5000 mg | INHALATION_SOLUTION | RESPIRATORY_TRACT | Status: DC | PRN

## 2018-08-01 MED ORDER — ACETAMINOPHEN 325 MG PO TABS
650.0000 mg | ORAL_TABLET | Freq: Once | ORAL | Status: AC
  Administered 2018-08-01: 650 mg via ORAL
  Filled 2018-08-01: qty 2

## 2018-08-01 MED ORDER — OSELTAMIVIR PHOSPHATE 30 MG PO CAPS
30.0000 mg | ORAL_CAPSULE | Freq: Once | ORAL | Status: AC
  Administered 2018-08-01: 30 mg via ORAL
  Filled 2018-08-01: qty 1

## 2018-08-01 MED ORDER — DIPHENHYDRAMINE HCL 25 MG PO CAPS
25.0000 mg | ORAL_CAPSULE | Freq: Every evening | ORAL | Status: DC | PRN
  Administered 2018-08-01 – 2018-08-02 (×2): 25 mg via ORAL
  Filled 2018-08-01 (×2): qty 1

## 2018-08-01 MED ORDER — IOHEXOL 300 MG/ML  SOLN: 30.0000 mL | Freq: Once | INTRAMUSCULAR | Status: DC | PRN

## 2018-08-01 MED ORDER — SODIUM CHLORIDE 0.9 % IV SOLN
Freq: Once | INTRAVENOUS | Status: AC
  Administered 2018-08-01: 17:00:00 via INTRAVENOUS

## 2018-08-01 MED ORDER — ACETAMINOPHEN 650 MG RE SUPP: 650.0000 mg | Freq: Four times a day (QID) | RECTAL | Status: DC | PRN

## 2018-08-01 MED ORDER — NICOTINE 14 MG/24HR TD PT24: 14.0000 mg | MEDICATED_PATCH | Freq: Every day | TRANSDERMAL | Status: DC | PRN

## 2018-08-01 MED ORDER — VANCOMYCIN VARIABLE DOSE PER UNSTABLE RENAL FUNCTION (PHARMACIST DOSING): Status: DC

## 2018-08-01 MED ORDER — SODIUM CHLORIDE 0.9 % IV SOLN
2.0000 g | Freq: Once | INTRAVENOUS | Status: AC
  Administered 2018-08-01: 2 g via INTRAVENOUS
  Filled 2018-08-01: qty 2

## 2018-08-01 MED ORDER — SODIUM CHLORIDE 0.9 % IV BOLUS (SEPSIS)
1000.0000 mL | Freq: Once | INTRAVENOUS | Status: AC
  Administered 2018-08-01: 1000 mL via INTRAVENOUS

## 2018-08-01 MED ORDER — PIPERACILLIN-TAZOBACTAM IN DEX 2-0.25 GM/50ML IV SOLN
2.2500 g | Freq: Three times a day (TID) | INTRAVENOUS | Status: DC
  Administered 2018-08-02 – 2018-08-03 (×5): 2.25 g via INTRAVENOUS
  Filled 2018-08-01 (×6): qty 50

## 2018-08-01 MED ORDER — ONDANSETRON HCL 4 MG/2ML IJ SOLN
4.0000 mg | Freq: Four times a day (QID) | INTRAMUSCULAR | Status: DC | PRN
  Administered 2018-08-02: 4 mg via INTRAVENOUS
  Filled 2018-08-01: qty 2

## 2018-08-01 MED ORDER — ACETAMINOPHEN 325 MG PO TABS
650.0000 mg | ORAL_TABLET | Freq: Four times a day (QID) | ORAL | Status: DC | PRN
  Administered 2018-08-02 – 2018-08-04 (×2): 650 mg via ORAL
  Filled 2018-08-01 (×2): qty 2

## 2018-08-01 MED ORDER — HEPARIN SODIUM (PORCINE) 5000 UNIT/ML IJ SOLN
5000.0000 [IU] | Freq: Three times a day (TID) | INTRAMUSCULAR | Status: DC
  Administered 2018-08-01 – 2018-08-03 (×7): 5000 [IU] via SUBCUTANEOUS
  Filled 2018-08-01 (×10): qty 1

## 2018-08-01 NOTE — Progress Notes (Signed)
A consult was received from an ED physician for vancomycin and Cefepime per pharmacy dosing (for an indication other than meningitis). The patient's profile has been reviewed for ht/wt/allergies/indication/available labs. A one time order has been placed for the above antibiotics.  Further antibiotics/pharmacy consults should be ordered by admitting physician if indicated.                       Reuel Boom, PharmD, BCPS [phone] 08/01/2018, 4:41 PM

## 2018-08-01 NOTE — ED Triage Notes (Signed)
Pt BIB [facility] from home c/o n/v/d, fever, chills, cough x4 days. Tachy at 120, hypotensive 88/56 with EMS. A/o x4, ambulatory. Also c/o headache.

## 2018-08-01 NOTE — ED Notes (Signed)
ED TO INPATIENT HANDOFF REPORT  Name/Age/Gender [name] 53 y.o. male  Code Status Code Status History    Date Active Date Inactive Code Status Order ID Comments User Context   03/19/2017 1721 03/27/2017 1736 Full Code [id number]  Rush Farmer, MD ED      Home/SNF/Other Home  Chief Complaint flu like symptoms  Level of Care/Admitting Diagnosis ED Disposition    ED Disposition Condition [place]: [facility] [[id number]]  Level of Care: Telemetry [5]  Admit to tele based on following criteria: Complex arrhythmia (Bradycardia/Tachycardia)  Diagnosis: Influenza B [355732]  Admitting Physician: Rhetta Mura [[id number]]  Attending Physician: Rhetta Mura [[id number]]  Estimated length of stay: past midnight tomorrow  Certification:: I certify this patient will need inpatient services for at least 2 midnights  PT Class (Do Not Modify): Inpatient [101]  PT Acc Code (Do Not Modify): Private [1]       Medical History Past Medical History:  Diagnosis Date  . Headache(784.0)   . Heart murmur    child  . Hx of radiation therapy 05/19/13- 06/27/13   glottic larynx, right true vocal cord 6300 cGy 28 sessions  . Hypothyroidism   . Shortness of breath   . 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 Fentanyl Nausea And Vomiting  . Citalopram Other (See Comments)    Dizziness and shakiness.  . Morphine And Related Nausea And Vomiting    IV Location/Drains/Wounds Patient Lines/Drains/Airways Status   Active Line/Drains/Airways    Name:   Placement date:   Placement time:   Site:   Days:   Peripheral IV 08/01/18 Right Antecubital   08/01/18    1509    Antecubital   less than 1   Peripheral IV 08/01/18 Left Antecubital   08/01/18    1509    Antecubital   less than 1   External Urinary Catheter   02/24/18    8315    -   158   Incision (Closed) 09/30/13 Lip Other (Comment)   09/30/13    0851     1766          Labs/Imaging Results for orders placed or performed during the hospital encounter of 08/01/18 (from the past 48 hour(s))  POC occult blood, ED     Status: Abnormal   Collection Time: 08/01/18  2:50 PM  Result Value Ref Range   Fecal Occult Bld POSITIVE (A) NEGATIVE  I-Stat CG4 Lactic Acid, ED     Status: Abnormal   Collection Time: 08/01/18  2:52 PM  Result Value Ref Range   Lactic Acid, Venous 2.45 (HH) 0.5 - 1.9 mmol/L   Comment NOTIFIED PHYSICIAN   I-stat troponin, ED (not at [facility])     Status: None   Collection Time: 08/01/18  2:54 PM  Result Value Ref Range   Troponin i, poc 0.07 0.00 - 0.08 ng/mL   Comment 3            Comment: Due to the release kinetics of cTnI, a negative result within the first hours of the onset of symptoms does not rule out myocardial infarction with certainty. If myocardial infarction is still suspected, repeat the test at appropriate intervals.   Lipase, blood     Status: Abnormal   Collection Time: 08/01/18  3:00 PM  Result Value Ref Range  Lipase 77 (H) 11 - 51 U/L    Comment: Performed at [facility], [place] [address], [place], [place] [address]  Comprehensive metabolic panel     Status: Abnormal   Collection Time: 08/01/18  3:02 PM  Result Value Ref Range   Sodium 136 135 - 145 mmol/L   Potassium 4.1 3.5 - 5.1 mmol/L   Chloride 98 98 - 111 mmol/L   CO2 19 (L) 22 - 32 mmol/L   Glucose, Bld 86 70 - 99 mg/dL   BUN 67 (H) 6 - 20 mg/dL   Creatinine, Ser 4.88 (H) 0.61 - 1.24 mg/dL   Calcium 8.0 (L) 8.9 - 10.3 mg/dL   Total Protein 7.9 6.5 - 8.1 g/dL   Albumin 4.4 3.5 - 5.0 g/dL   AST 47 (H) 15 - 41 U/L   ALT 23 0 - 44 U/L   Alkaline Phosphatase 56 38 - 126 U/L   Total Bilirubin 0.5 0.3 - 1.2 mg/dL   GFR calc non Af Amer 12 (L) >60 mL/min   GFR calc Af Amer 14 (L) >60 mL/min   Anion gap 19 (H) 5 - 15    Comment: Performed at  [facility], [place] [address], [place], [place] [address]  CBC WITH DIFFERENTIAL     Status: Abnormal   Collection Time: 08/01/18  3:02 PM  Result Value Ref Range   WBC 10.0 4.0 - 10.5 K/uL   RBC 5.62 4.22 - 5.81 MIL/uL   Hemoglobin 18.4 (H) 13.0 - 17.0 g/dL   HCT 55.9 (H) 39.0 - 52.0 %   MCV 99.5 80.0 - 100.0 fL   MCH 32.7 26.0 - 34.0 pg   MCHC 32.9 30.0 - 36.0 g/dL   RDW 13.3 11.5 - 15.5 %   Platelets 151 150 - 400 K/uL   nRBC 0.0 0.0 - 0.2 %   Neutrophils Relative % 90 %   Neutro Abs 9.0 (H) 1.7 - 7.7 K/uL   Lymphocytes Relative 5 %   Lymphs Abs 0.5 (L) 0.7 - 4.0 K/uL   Monocytes Relative 4 %   Monocytes Absolute 0.4 0.1 - 1.0 K/uL   Eosinophils Relative 0 %   Eosinophils Absolute 0.0 0.0 - 0.5 K/uL   Basophils Relative 0 %   Basophils Absolute 0.0 0.0 - 0.1 K/uL   Immature Granulocytes 1 %   Abs Immature Granulocytes 0.06 0.00 - 0.07 K/uL    Comment: Performed at [facility], [place] [address] [place], [place] [address]  CK     Status: None   Collection Time: 08/01/18  3:23 PM  Result Value Ref Range   Total CK 220 49 - 397 U/L    Comment: Performed at [facility], [place] [address], [place], [place] [address]  Influenza panel by PCR (type A & B)     Status: Abnormal   Collection Time: 08/01/18  3:40 PM  Result Value Ref Range   Influenza A By PCR NEGATIVE NEGATIVE   Influenza B By PCR POSITIVE (A) NEGATIVE    Comment: (NOTE) The Xpert Xpress Flu assay is intended as an aid in the diagnosis of  influenza and should not be used as a sole basis for treatment.  This  assay is FDA approved for nasopharyngeal swab specimens only. Nasal  washings and aspirates are unacceptable for Xpert Xpress Flu testing. Performed at [facility], [place] [address] [place], [place] [address]   I-Stat CG4 Lactic Acid,  ED     Status: None   Collection Time: 08/01/18  7:05 PM  Result Value Ref Range   Lactic Acid,  Venous 1.50 0.5 - 1.9 mmol/L  Urinalysis, Routine w reflex microscopic     Status: Abnormal   Collection Time: 08/01/18  7:13 PM  Result Value Ref Range   Color, Urine YELLOW YELLOW   APPearance CLEAR CLEAR   Specific Gravity, Urine 1.014 1.005 - 1.030   pH 5.0 5.0 - 8.0   Glucose, UA NEGATIVE NEGATIVE mg/dL   Hgb urine dipstick MODERATE (A) NEGATIVE   Bilirubin Urine NEGATIVE NEGATIVE   Ketones, ur NEGATIVE NEGATIVE mg/dL   Protein, ur 100 (A) NEGATIVE mg/dL   Nitrite NEGATIVE NEGATIVE   Leukocytes, UA NEGATIVE NEGATIVE   RBC / HPF 0-5 0 - 5 RBC/hpf   WBC, UA 11-20 0 - 5 WBC/hpf   Bacteria, UA RARE (A) NONE SEEN   Mucus PRESENT     Comment: Performed at [facility], [place] [address] [place], [place] [address]  Rapid urine drug screen (hospital performed)     Status: Abnormal   Collection Time: 08/01/18  7:13 PM  Result Value Ref Range   Opiates NONE DETECTED NONE DETECTED   Cocaine POSITIVE (A) NONE DETECTED   Benzodiazepines NONE DETECTED NONE DETECTED   Amphetamines NONE DETECTED NONE DETECTED   Tetrahydrocannabinol POSITIVE (A) NONE DETECTED   Barbiturates NONE DETECTED NONE DETECTED    Comment: (NOTE) DRUG SCREEN FOR MEDICAL PURPOSES ONLY.  IF CONFIRMATION IS NEEDED FOR ANY PURPOSE, NOTIFY LAB WITHIN 5 DAYS. LOWEST DETECTABLE LIMITS FOR URINE DRUG SCREEN Drug Class                     Cutoff (ng/mL) Amphetamine and metabolites    1000 Barbiturate and metabolites    200 Benzodiazepine                 993 Tricyclics and metabolites     300 Opiates and metabolites        300 Cocaine and metabolites        300 THC                            50 Performed at [facility], [place] [address] [place], [place] [address]    Ct Abdomen Pelvis Wo Contrast  Result Date: 08/01/2018 CLINICAL DATA:  53 year old male with history of nausea, vomiting and diarrhea. Fever, chills and cough for the past 4 days. EXAM: CT ABDOMEN AND PELVIS WITHOUT  CONTRAST TECHNIQUE: Multidetector CT imaging of the abdomen and pelvis was performed following the standard protocol without IV contrast. COMPARISON:  PET-CT 05/08/2013. FINDINGS: Lower chest: Small area of airspace consolidation in the medial aspect of the left lower lobe. Hepatobiliary: No definite cystic or solid hepatic lesions are confidently identified on today's noncontrast CT examination. Unenhanced appearance of the gallbladder is normal. Pancreas: No definite pancreatic mass or peripancreatic fluid or inflammatory changes are noted on today's noncontrast CT examination. Spleen: Unremarkable. Adrenals/Urinary Tract: There are no abnormal calcifications within the collecting system of either kidney, along the course of either ureter, or within the lumen of the urinary bladder. No hydroureteronephrosis or perinephric stranding to suggest urinary tract obstruction at this time. The unenhanced appearance of the kidneys is unremarkable bilaterally. Unenhanced appearance of the urinary bladder is normal. Bilateral adrenal glands are normal in appearance. Stomach/Bowel: Unenhanced appearance of the stomach  is normal. Multiple mildly dilated loops of small bowel with multiple air-fluid levels. There is also mild dilatation of portions of the colon with a relatively large volume of liquid stool, particularly in the right-side of the colon. Distal rectal stool and fluid also noted. Normal appendix. Vascular/Lymphatic: Aortic atherosclerosis. No lymphadenopathy noted in the abdomen or pelvis. Reproductive: Prostate gland and seminal vesicles are unremarkable in appearance. Other: No significant volume of ascites.  No pneumoperitoneum. Musculoskeletal: There are no aggressive appearing lytic or blastic lesions noted in the visualized portions of the skeleton. IMPRESSION: 1. Diffuse mild dilatation of small bowel and colon with multiple air-fluid levels throughout. Findings are suggestive of enterocolitis. 2. Small  amount of airspace consolidation in the medial aspect of the left lower lobe. This is concerning for developing infection or sequela of recent aspiration. 3. Aortic atherosclerosis. Electronically Signed   By: Vinnie Langton M.D.   On: 08/01/2018 19:40   Dg Chest 2 View  Result Date: 08/01/2018 CLINICAL DATA:  Cough and fever.  History of laryngeal carcinoma EXAM: CHEST - 2 VIEW COMPARISON:  April 16, 2018 FINDINGS: Lungs are clear. The heart size and pulmonary vascularity are normal. No adenopathy. No bone lesions. Visualized trachea appears unremarkable. IMPRESSION: No edema or consolidation.  No neoplastic focus evident. Electronically Signed   By: Lowella Grip III M.D.   On: 08/01/2018 15:34    Pending Labs Unresulted Labs (From admission, onward)    Start     Ordered   08/01/18 1631  MRSA PCR Screening  Once,   R     08/01/18 1630   08/01/18 1439  Blood Culture (routine x 2)  BLOOD CULTURE X 2,   STAT     08/01/18 1440          Vitals/Pain Today's Vitals   08/01/18 1900 08/01/18 1903 08/01/18 2000 08/01/18 2008  BP: (!) 87/65 92/62 91/61    Pulse: 82  80   Resp: (!) 23  20   Temp:   98.6 F (37 C)   TempSrc:   Oral   SpO2: 96%  98%   Weight:      Height:      PainSc:    0-No pain    Isolation Precautions Droplet precaution  Medications Medications  metroNIDAZOLE (FLAGYL) IVPB 500 mg (0 mg Intravenous Stopped 08/01/18 1846)  iohexol (OMNIPAQUE) 300 MG/ML solution 30 mL (has no administration in time range)  sodium chloride 0.9 % bolus 1,000 mL (0 mLs Intravenous Stopped 08/01/18 1847)    And  sodium chloride 0.9 % bolus 1,000 mL (0 mLs Intravenous Stopped 08/01/18 1704)  acetaminophen (TYLENOL) tablet 650 mg (650 mg Oral Given 08/01/18 1517)  ondansetron (ZOFRAN) injection 4 mg (4 mg Intravenous Given 08/01/18 1712)  0.9 %  sodium chloride infusion ( Intravenous Stopped 08/01/18 2008)  oseltamivir (TAMIFLU) capsule 30 mg (30 mg Oral Given 08/01/18 1725)  ceFEPIme  (MAXIPIME) 2 g in sodium chloride 0.9 % 100 mL IVPB (0 g Intravenous Stopped 08/01/18 1846)  vancomycin (VANCOCIN) IVPB 1000 mg/200 mL premix (0 mg Intravenous Stopped 08/01/18 2008)  sodium chloride 0.9 % bolus 500 mL (500 mLs Intravenous New Bag/Given 08/01/18 2009)    Mobility non-ambulatory

## 2018-08-01 NOTE — Progress Notes (Signed)
Pharmacy Antibiotic Note  [name] is a 53 y.o. male with hx polysubstance abuse presented to the ED on 08/01/2018 with c/o n/v/d, fever and cough. To start vancomycin and zosyn for sepsis and PNA.  - 1/9 abd CT: suggestive of enterocolitis; Small amount of airspace consolidation in the medial aspect of the left lower lobe. This is concerning for developing infection or sequela of recent aspiration  - Tmax 102.6, wbc wnl, scr 4.88 (crcl~16) - 1/9 influ: pos for B   Plan: - vancomycin 1000 mg IV x1 given in the ED at 1900. With renal insuff., will check random vancomycin level at [facility] on 1/10 to assess drug clearance. Will redose if level is 20 - zosyn 2.25 gm IV q8h - f/u renal function closely - daily scr  _______________________________________  Height: 5\' 11"  (180.3 cm) Weight: 143 lb (64.9 kg) IBW/kg (Calculated) : 75.3  Temp (24hrs), Avg:99.7 F (37.6 C), Min:98.4 F (36.9 C), Max:102.6 F (39.2 C)  Recent Labs  Lab 08/01/18 1452 08/01/18 1502 08/01/18 1905  WBC  --  10.0  --   CREATININE  --  4.88*  --   LATICACIDVEN 2.45*  --  1.50    Estimated Creatinine Clearance: 15.9 mL/min (A) (by C-G formula based on SCr of 4.88 mg/dL (H)).    Allergies  Allergen Reactions  . Fentanyl Nausea And Vomiting  . Citalopram Other (See Comments)    Dizziness and shakiness.  . Morphine And Related Nausea And Vomiting    Thank you for allowing pharmacy to be a part of this patient's care.  Lynelle Doctor 08/01/2018 10:07 PM

## 2018-08-01 NOTE — ED Notes (Signed)
Pt aware urine sample is needed 

## 2018-08-01 NOTE — ED Notes (Signed)
Patient transported to CT 

## 2018-08-01 NOTE — ED Provider Notes (Signed)
[facility]-EMERGENCY DEPT Provider Note   CSN: [id number] Arrival date & time: 08/01/18  1347     History   Chief Complaint Chief Complaint  Patient presents with  . Flu Symptoms    HPI Benoit E Younce is a 53 y.o. male.  HPI  Patient is a 53-year-old male with a history of polysubstance use, last use of intranasal heroin 2 weeks ago, hypothyroidism, history of squamous cell carcinoma of the larynx, tobacco use presenting for myalgias, fever, nausea, vomiting, and diarrhea.  Patient reports he has felt poorly for the past 4 days and has had 4-5 episodes of nonbilious and nonbloody emesis as well as 4-5 episodes of watery diarrhea per day.  He reports that his stools have "dark".  Patient denies any alcohol use.  He does report frequent Goody powder use.  Patient denies lower abdominal pain that is crampy in nature, but no upper abdominal pain.  Patient reports that he lives with his brother who has had similar symptoms.  Patient reports chills and sweating, but no recorded fevers at home.  He also reports productive cough of white sputum.  Denies dysuria, urgency, or frequency.  No remedies prior to arrival for symptoms.  Past Medical History:  Diagnosis Date  . Headache(784.0)   . Heart murmur    child  . Hx of radiation therapy 05/19/13- 06/27/13   glottic larynx, right true vocal cord 6300 cGy 28 sessions  . Hypothyroidism   . Shortness of breath   . 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 Fatigue 04/16/2018  . Polysubstance dependence including opioid type drug, episodic abuse, with delirium (HCC) 03/22/2017  . Acute respiratory failure with hypoxemia (HCC) 03/19/2017  . Homelessness 10/25/2016  . Long term (current) use of opiate analgesic 10/11/2016  . Emphysema of lung (HCC) 12/18/2014  . Shortness of breath 11/12/2014  . Esophageal  dysphagia 09/10/2014  . Tobacco use disorder 09/10/2014  . Routine health maintenance 07/09/2014  . Hypothyroidism due to acquired atrophy of thyroid 06/12/2014  . GERD (gastroesophageal reflux disease) 06/12/2014  . Hx of radiation therapy   . 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 05/06/2013  . Hoarseness of voice 03/17/2013    Past Surgical History:  Procedure Laterality Date  . BALLOON DILATION N/A 12/30/2013   Procedure: BALLOON DILATION;  Surgeon: Martin K Johnson, MD;  Location: [facility];  Service: Endoscopy;  Laterality: N/A;  . DIRECT LARYNGOSCOPY Right 04/24/2013   Procedure: DIRECT LARYNGOSCOPY and BIOPSY OF TONGUE;  Surgeon: [name], MD;  Location: [facility] OR;  Service: ENT;  Laterality: Right;  . ESOPHAGEAL MANOMETRY N/A 09/07/2014   Procedure: ESOPHAGEAL MANOMETRY (EM);  Surgeon: Martin K Johnson, MD;  Location: [facility];  Service: Endoscopy;  Laterality: N/A;  . ESOPHAGOGASTRODUODENOSCOPY (EGD) WITH PROPOFOL N/A 12/30/2013   Procedure: ESOPHAGOGASTRODUODENOSCOPY (EGD) WITH PROPOFOL;  Surgeon: Martin K Johnson, MD;  Location: [facility];  Service: Endoscopy;  Laterality: N/A;  . MICROLARYNGOSCOPY Right 04/24/2013   Procedure: MICROLARYNGOSCOPY and RIGHT VOCAL CORD BIOPSY;  Surgeon: [name], MD;  Location: [facility] OR;  Service: ENT;  Laterality: Right;  . MULTIPLE EXTRACTIONS WITH ALVEOLOPLASTY N/A 09/30/2013   Procedure: Extraction of tooth #'s 2,3,4,5,6,7,8,9,10,11,12,15,22,23,24,25,26,27,28 with alveoloplasty;  Surgeon: Ronald F Kulinski, DDS;  Location: [facility];  Service: Oral Surgery;  Laterality: N/A;  . TONSILLECTOMY       as a child  . wisdon teeth     age 65        Home Medications    Prior to Admission medications   Medication Sig Start Date End Date Taking? Authorizing Provider  levothyroxine (SYNTHROID, LEVOTHROID) 175 MCG tablet Take 1 tablet (175 mcg total) by mouth daily before breakfast. Void previous prescription  on file for levothyroxine 04/16/18   Lorella Nimrod, MD  naloxone [facility]) 2 MG/2ML injection Lazarus kit 06/18/17   Page, Nathan Mali, MD    Family History Family History  Problem Relation Age of Onset  . COPD Mother   . Emphysema Mother   . Heart attack Father     Social History Social History   Tobacco Use  . Smoking status: Current Every Day Smoker    Packs/day: 1.00    Years: 34.00    Pack years: 34.00    Types: Cigarettes  . Smokeless tobacco: Never Used  . Tobacco comment: DOWN TO 4 - 5 CIGARETTES A DAY  Substance Use Topics  . Alcohol use: No    Alcohol/week: 0.0 standard drinks  . Drug use: Yes    Comment: Heroin     Allergies   Fentanyl; Citalopram; and Morphine and related   Review of Systems Review of Systems  Constitutional: Positive for chills and fever.  HENT: Positive for congestion and rhinorrhea. Negative for sinus pain and sore throat.   Eyes: Negative for visual disturbance.  Respiratory: Positive for cough. Negative for chest tightness and shortness of breath.   Cardiovascular: Negative for chest pain, palpitations and leg swelling.  Gastrointestinal: Positive for abdominal pain, diarrhea, nausea and vomiting. Negative for constipation.  Genitourinary: Negative for dysuria and flank pain.  Musculoskeletal: Negative for back pain, myalgias, neck pain and neck stiffness.  Skin: Negative for rash.  Neurological: Negative for dizziness, syncope, light-headedness and headaches.     Physical Exam Updated Vital Signs BP 100/76 (BP Location: Left Arm)   Pulse (!) 104   Temp (!) 102.6 F (39.2 C) (Rectal)   Resp (!) 22   Ht 5' 11" (1.803 m)   Wt 64.9 kg   SpO2 99%   BMI 19.94 kg/m   Physical Exam Vitals signs and nursing note reviewed.  Constitutional:      General: He is not in acute distress.    Appearance: He is well-developed. He is diaphoretic. He is not ill-appearing.     Comments: Thin. Appearing poorly nourished.   HENT:      Head: Normocephalic and atraumatic.     Right Ear: Tympanic membrane normal.     Left Ear: Tympanic membrane normal.     Nose: Nose normal.     Mouth/Throat:     Mouth: Mucous membranes are dry.     Pharynx: No oropharyngeal exudate.  Eyes:     General: No scleral icterus.       Right eye: No discharge.        Left eye: No discharge.     Extraocular Movements: Extraocular movements intact.     Conjunctiva/sclera: Conjunctivae normal.     Pupils: Pupils are equal, round, and reactive to light.     Comments: Pupils 3 mm and equal bilaterally.   Neck:     Musculoskeletal: Normal range of motion and neck supple. No neck rigidity.  Cardiovascular:     Rate and Rhythm: Regular rhythm. Tachycardia present.     Heart sounds: S1 normal and S2 normal. No murmur.  Pulmonary:  Effort: Pulmonary effort is normal.     Breath sounds: Rales present. No wheezing.     Comments: Coarse crackles in right lung base. Abdominal:     General: There is no distension.     Palpations: Abdomen is soft.     Tenderness: There is no abdominal tenderness. There is no guarding.     Comments: Mild lower abdominal tenderness palpation in a bandlike pattern.  No focal right lower or left lower quadrant tenderness.  Genitourinary:    Comments: Rectal examination performed with RN chaperone present.  Normal rectal tone.  Patient is soft brown stool present in rectal vault.  No melena.  No hematochezia. Musculoskeletal: Normal range of motion.        General: No deformity.  Lymphadenopathy:     Cervical: No cervical adenopathy.  Skin:    General: Skin is warm.     Findings: No erythema or rash.     Comments: No rash of face, chest, back, or extremities.  Neurological:     Mental Status: He is alert.     Comments: Cranial nerves grossly intact. Patient moves extremities symmetrically and with good coordination.  Psychiatric:        Behavior: Behavior normal.        Thought Content: Thought content normal.         Judgment: Judgment normal.      ED Treatments / Results  Labs (all labs ordered are listed, but only abnormal results are displayed) Labs Reviewed  COMPREHENSIVE METABOLIC PANEL - Abnormal; Notable for the following components:      Result Value   CO2 19 (*)    BUN 67 (*)    Creatinine, Ser 4.88 (*)    Calcium 8.0 (*)    AST 47 (*)    GFR calc non Af Amer 12 (*)    GFR calc Af Amer 14 (*)    Anion gap 19 (*)    All other components within normal limits  CBC WITH DIFFERENTIAL/PLATELET - Abnormal; Notable for the following components:   Hemoglobin 18.4 (*)    HCT 55.9 (*)    Neutro Abs 9.0 (*)    Lymphs Abs 0.5 (*)    All other components within normal limits  INFLUENZA PANEL BY PCR (TYPE A & B) - Abnormal; Notable for the following components:   Influenza B By PCR POSITIVE (*)    All other components within normal limits  LIPASE, BLOOD - Abnormal; Notable for the following components:   Lipase 77 (*)    All other components within normal limits  I-STAT CG4 LACTIC ACID, ED - Abnormal; Notable for the following components:   Lactic Acid, Venous 2.45 (*)    All other components within normal limits  POC OCCULT BLOOD, ED - Abnormal; Notable for the following components:   Fecal Occult Bld POSITIVE (*)    All other components within normal limits  CULTURE, BLOOD (ROUTINE X 2)  CULTURE, BLOOD (ROUTINE X 2)  MRSA PCR SCREENING  CK  URINALYSIS, ROUTINE W REFLEX MICROSCOPIC  RAPID URINE DRUG SCREEN, HOSP PERFORMED  I-STAT TROPONIN, ED  I-STAT CG4 LACTIC ACID, ED    EKG EKG Interpretation  Date/Time:  Thursday August 01 2018 15:30:33 EST Ventricular Rate:  102 PR Interval:    QRS Duration: 92 QT Interval:  326 QTC Calculation: 425 R Axis:   78 Text Interpretation:  Sinus tachycardia Borderline repolarization abnormality No significant change since last tracing Confirmed by Wandra Arthurs [phone])  on 08/01/2018 4:23:54 PM   Radiology No results  found.  Procedures Procedures (including critical care time) CRITICAL CARE Performed by:  B    Total critical care time: 35 minutes  Critical care time was exclusive of separately billable procedures and treating other patients.  Critical care was necessary to treat or prevent imminent or life-threatening deterioration.  Critical care was time spent personally by me on the following activities: development of treatment plan with patient and/or surrogate as well as nursing, discussions with consultants, evaluation of patient's response to treatment, examination of patient, obtaining history from patient or surrogate, ordering and performing treatments and interventions, ordering and review of laboratory studies, ordering and review of radiographic studies, pulse oximetry and re-evaluation of patient's condition.   Medications Ordered in ED Medications  metroNIDAZOLE (FLAGYL) IVPB 500 mg (0 mg Intravenous Stopped 08/01/18 1846)  vancomycin (VANCOCIN) IVPB 1000 mg/200 mL premix (1,000 mg Intravenous New Bag/Given 08/01/18 1858)  iohexol (OMNIPAQUE) 300 MG/ML solution 30 mL (has no administration in time range)  sodium chloride 0.9 % bolus 500 mL (has no administration in time range)  sodium chloride 0.9 % bolus 1,000 mL (0 mLs Intravenous Stopped 08/01/18 1847)    And  sodium chloride 0.9 % bolus 1,000 mL (0 mLs Intravenous Stopped 08/01/18 1704)  acetaminophen (TYLENOL) tablet 650 mg (650 mg Oral Given 08/01/18 1517)  ondansetron (ZOFRAN) injection 4 mg (4 mg Intravenous Given 08/01/18 1712)  0.9 %  sodium chloride infusion ( Intravenous New Bag/Given 08/01/18 1726)  oseltamivir (TAMIFLU) capsule 30 mg (30 mg Oral Given 08/01/18 1725)  ceFEPIme (MAXIPIME) 2 g in sodium chloride 0.9 % 100 mL IVPB (0 g Intravenous Stopped 08/01/18 1846)     Initial Impression / Assessment and Plan / ED Course  I have reviewed the triage vital signs and the nursing notes.  Pertinent labs & imaging results  that were available during my care of the patient were reviewed by me and considered in my medical decision making (see chart for details).  Clinical Course as of Aug 01 2098  Thu Aug 01, 2018  1503 Obtained via rectal temperature.   Temp(!): 102.6 F (39.2 C) [AM]  1531 Noted. Patient receiving fluid resuscitation.   Lactic Acid, Venous(!!): 2.45 [AM]  1609 Suggestive of hemoconcentration.   Hemoglobin(!): 18.4 [AM]  1637 Case discussed with Dr. David Yao.    [AM]  1814 Not elevated to 3x upper limit of normal.  Suspect due to poor PO intake.   Lipase(!): 77 [AM]  2003 Patient admitted per Dr. Herwerta.  Appreciate his involvement in the care of this patient.   [AM]    Clinical Course User Index [AM] ,  B, PA-C   Patient meeting SIRS criteria based on pulse, temperature, and blood pressure. See vitals below. Suspected source of infection influenza based on history and positive testing. Lactic acid 2.45.. Signs of end organ dysfunction include AKI.  Will obtain CT abdomen and pelvis without contrast to assess for any other intra-abdominal abnormalities given patient's abdominal pain and SIRS response.   Vitals:   08/01/18 1424 08/01/18 1428 08/01/18 1434 08/01/18 1533  BP:    94/67  Pulse:    96  Resp:    16  Temp:  99.1 F (37.3 C) (!) 102.6 F (39.2 C)   TempSrc:  Oral Rectal   SpO2:    97%  Weight: 64.9 kg     Height: 5' 11" (1.803 m)        Broad   spectrum antibiotics initiated based on suspected source of infection. 30 cc/kilo fluid bolus administered due to hypotensive reading on arrival.   Sepsis - Repeat Assessment  Performed at:    [place]:    08/01/18 1903  BP:  92/62  Pulse: 84   Resp: 19   Temp:    SpO2: 95%     Heart:     Regular rate and rhythm  Lungs:    CTA  Capillary Refill:   <2 sec  Peripheral Pulse:   Dorsalis pedis pulse  palpable  Skin:     Normal Color, some flushing to skin.   7:09 PM Patient's blood  pressures continue to be soft after 30 cc/kg bolus.  Will administer 500 mL over 31 minutes and reassess pulmonary exam.  Lungs are clear to auscultation currently.  Remainder of work-up demonstrating enterocolitis on CT abdomen and pelvis.  Patient is covered with broad-spectrum antibiotics.  Spoke with hospitalist Dr. Kathee Polite who has decided to admit patient to a high level of care.   3:43 PM I spoke with the patient's daughter, Shlomo Seres, who states that she has previously made medical decisions for her father when he was incapacitated due to overdose, but otherwise is not aware of legal guardianship paperwork.  Patient is alert, oriented, cooperative, and coherent.  No other prior documentation the patient requires a legal guardian due to problems with capacity.  This is a shared visit with Dr. Shirlyn Goltz. Patient was independently evaluated by this attending physician. Attending physician consulted in evaluation and admission management.  Final Clinical Impressions(s) / ED Diagnoses   Final diagnoses:  Influenza B  Community acquired pneumonia of left lung, unspecified part of lung  AKI (acute kidney injury) ([place])  Cocaine use  Cannabis use disorder, mild, abuse    ED Discharge Orders    None       Tamala Julian 08/01/18 2104    Drenda Freeze, MD 08/01/18 2256

## 2018-08-01 NOTE — H&P (Signed)
History and Physical    PLEASE NOTE THAT DRAGON DICTATION SOFTWARE WAS USED IN THE CONSTRUCTION OF THIS NOTE.   [name] UUV:[id number] DOB: 18-Feb-1966 DOA: 08/01/2018  PCP: Patient, No Pcp Per Patient coming from: home  I have personally briefly reviewed patient's old medical records in [place]  Chief Complaint: fever  HPI: [name] is a 53 y.o. male with medical history significant for stage III chronic kidney disease with baseline creatinine of 1.50, tobacco abuse, hypothyroidism, who is admitted to [facility] long hospital on 08/01/2018 with severe sepsis due to suspected left lower lobe pneumonia after presenting to the [facility] long emergency department complaining of subjective fever/chills.  The patient reports 4 to 5 days of subjective fever, chills, and generalized myalgias.  Over that time he notes associated productive cough in the absence of any hemoptysis. Over that timeframe he also reports nausea resulting in approximately 5 episodes of nonbloody, nonbilious emesis as well as 5 episodes of watery, loose stool in the absence of any associated melena or hematochezia.  He notes associated mild, bilateral lower abdominal quadrant discomfort, which he describes as crampy in nature.  No recent trauma or travel.  The patient reports that he lives with his brother, who has been experiencing a very similar constellation of symptoms.  The patient denies any associated neck stiffness, dysuria, gross hematuria, or urinary urgency/frequency.   ED Course: Vital signs in the emergency department were notable for the following: Immature 102.6; heart rate 104; respiratory rate 16-23, initial blood pressure 87/65, which is improved to 97/68 following interval IV fluids as further described below and oxygen saturation 95 to 100% on room air.  Labs from in the ED were notable for the following: CMP notable for creatinine of 4.88 relative to most recent prior creatinine value of 1.5 on  04/16/2018; lipase 77; CPK 220.  CBC notable for white blood cell count of 10,000 with 90% neutrophils.  Initial lactic acid 2.45, which improved to 1.50 following interval IV fluid administration, as per the scribe below.  Urinalysis showed 11-20 white blood cells, rare bacteria, and was nitrite negative as well as leukocyte Estrace negative.  Blood cultures x2 were collected prior to initiation of any antibiotics.  Rapid influenza found to be positive for influenza B.  Urinary drug screen was positive for cocaine.  CT abdomen/pelvis, per final radiology report, showed evidence of left lower lobe infiltrate concerning for pneumonia as well as findings suggestive of enterocolitis in the absence of evidence of obstruction, abscess, or bowel perforation.  While in the emergency department, the following were administered: IV normal saline x2.5 L bolus, followed by initiation of continuous IV normal saline at 125 cc/h, cefepime 2 g IV x1, Flagyl 500 mg IV x1, vancomycin 1 g IV x1.  Subsequently, the patient was admitted to the med telemetry floor for further evaluation and management of presenting severe sepsis due to suspected left lower lobe pneumonia.    Review of Systems: As per HPI otherwise 10 point review of systems negative.   Past Medical History:  Diagnosis Date  . Headache(784.0)   . Heart murmur    child  . Hx of radiation therapy 05/19/13- 06/27/13   glottic larynx, right true vocal cord 6300 cGy 28 sessions  . Hypothyroidism   . Shortness of breath   . 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  BALLOON DILATION N/A 12/30/2013   Procedure: BALLOON DILATION;  Surgeon: Garlan Fair, MD;  Location: [facility];  Service: Endoscopy;  Laterality: N/A;  . DIRECT LARYNGOSCOPY Right 04/24/2013   Procedure: DIRECT LARYNGOSCOPY and BIOPSY OF TONGUE;  Surgeon: Jerrell Belfast, MD;   Location: [facility];  Service: ENT;  Laterality: Right;  . ESOPHAGEAL MANOMETRY N/A 09/07/2014   Procedure: ESOPHAGEAL MANOMETRY (EM);  Surgeon: Garlan Fair, MD;  Location: [facility];  Service: Endoscopy;  Laterality: N/A;  . ESOPHAGOGASTRODUODENOSCOPY (EGD) WITH PROPOFOL N/A 12/30/2013   Procedure: ESOPHAGOGASTRODUODENOSCOPY (EGD) WITH PROPOFOL;  Surgeon: Garlan Fair, MD;  Location: [facility];  Service: Endoscopy;  Laterality: N/A;  . MICROLARYNGOSCOPY Right 04/24/2013   Procedure: MICROLARYNGOSCOPY and RIGHT VOCAL CORD BIOPSY;  Surgeon: Jerrell Belfast, MD;  Location: [facility];  Service: ENT;  Laterality: Right;  . MULTIPLE EXTRACTIONS WITH ALVEOLOPLASTY N/A 09/30/2013   Procedure: Extraction of tooth #'s 2,3,4,5,6,7,8,9,10,11,12,15,22,23,24,25,26,27,28 with alveoloplasty;  Surgeon: Lenn Cal, DDS;  Location: [facility];  Service: Oral Surgery;  Laterality: N/A;  . TONSILLECTOMY     as a child  . wisdon teeth     age 23    Social History:  reports that he has been smoking cigarettes. He has a 34.00 pack-year smoking history. He has never used smokeless tobacco. He reports current drug use. He reports that he does not drink alcohol.   Allergies  Allergen Reactions  . Fentanyl Nausea And Vomiting  . Citalopram Other (See Comments)    Dizziness and shakiness.  . Morphine And Related Nausea And Vomiting    Family History  Problem Relation Age of Onset  . COPD Mother   . Emphysema Mother   . Heart attack Father      Prior to Admission medications   Medication Sig Start Date End Date Taking? Authorizing Provider  Aspirin-Salicylamide-Caffeine (BC HEADACHE PO) Take 1 packet by mouth daily as needed (headache).   Yes [provider]  levothyroxine (SYNTHROID, LEVOTHROID) 175 MCG tablet Take 1 tablet (175 mcg total) by mouth daily before breakfast. Void previous prescription on file for levothyroxine Patient not taking: Reported on 08/01/2018 04/16/18   Lorella Nimrod, MD   naloxone Karma Greaser) 2 MG/2ML injection Ma Rings kit Patient not taking: Reported on 08/01/2018 06/18/17   Page, Nathan Mali, MD      Objective     Physical Exam: Vitals:   08/01/18 1700 08/01/18 1857 08/01/18 1900 08/01/18 1903  BP: 100/75 (!) 88/63 (!) 87/65 92/62  Pulse: 96 84 82   Resp: (!) 22 19 (!) 23   Temp:      TempSrc:      SpO2: 97% 95% 96%   Weight:      Height:        General: appears to be stated age; alert, oriented Skin: warm, dry, no rash Head:  AT/[place] Mouth:  Oral mucosa membranes appear dry, normal dentition Neck: supple; trachea midline Heart:  RRR; did not appreciate any M/R/G Lungs: left basilar crackles noted, otherwise CTAB, without wheezes Abdomen: + BS; soft, ND, mild tenderness to b/l lower abd quadrants in the absence of any associated guarding, rigidity, or rebound tenderness. Extremities: no peripheral edema, no muscle wasting   Labs on Admission: I have personally reviewed following labs and imaging studies  CBC: Recent Labs  Lab 08/01/18 1502  WBC 10.0  NEUTROABS 9.0*  HGB 18.4*  HCT 55.9*  MCV 99.5  PLT 309   Basic Metabolic Panel: Recent Labs  Lab 08/01/18 1502  NA 136  K 4.1  CL 98  CO2 19*  GLUCOSE 86  BUN 67*  CREATININE 4.88*  CALCIUM 8.0*   GFR: Estimated Creatinine Clearance: 15.9 mL/min (A) (by C-G formula based on SCr of 4.88 mg/dL (H)). Liver Function Tests: Recent Labs  Lab 08/01/18 1502  AST 47*  ALT 23  ALKPHOS 56  BILITOT 0.5  PROT 7.9  ALBUMIN 4.4   Recent Labs  Lab 08/01/18 1500  LIPASE 77*   No results for input(s): AMMONIA in the last 168 hours. Coagulation Profile: No results for input(s): INR, PROTIME in the last 168 hours. Cardiac Enzymes: Recent Labs  Lab 08/01/18 1523  CKTOTAL 220   BNP (last 3 results) No results for input(s): PROBNP in the last 8760 hours. HbA1C: No results for input(s): HGBA1C in the last 72 hours. CBG: No results for input(s): GLUCAP in the last 168  hours. Lipid Profile: No results for input(s): CHOL, HDL, LDLCALC, TRIG, CHOLHDL, LDLDIRECT in the last 72 hours. Thyroid Function Tests: No results for input(s): TSH, T4TOTAL, FREET4, T3FREE, THYROIDAB in the last 72 hours. Anemia Panel: No results for input(s): VITAMINB12, FOLATE, FERRITIN, TIBC, IRON, RETICCTPCT in the last 72 hours. Urine analysis:    Component Value Date/Time   COLORURINE YELLOW 08/01/2018 1913   APPEARANCEUR CLEAR 08/01/2018 1913   APPEARANCEUR Clear 04/16/2018 1057   LABSPEC 1.014 08/01/2018 1913   PHURINE 5.0 08/01/2018 1913   GLUCOSEU NEGATIVE 08/01/2018 1913   HGBUR MODERATE (A) 08/01/2018 1913   BILIRUBINUR NEGATIVE 08/01/2018 1913   BILIRUBINUR Negative 04/16/2018 1057   KETONESUR NEGATIVE 08/01/2018 1913   PROTEINUR 100 (A) 08/01/2018 1913   UROBILINOGEN 1.0 02/21/2015 1450   NITRITE NEGATIVE 08/01/2018 1913   LEUKOCYTESUR NEGATIVE 08/01/2018 1913   LEUKOCYTESUR Negative 04/16/2018 1057    Radiological Exams on Admission: Ct Abdomen Pelvis Wo Contrast  Result Date: 08/01/2018 CLINICAL DATA:  53 year old male with history of nausea, vomiting and diarrhea. Fever, chills and cough for the past 4 days. EXAM: CT ABDOMEN AND PELVIS WITHOUT CONTRAST TECHNIQUE: Multidetector CT imaging of the abdomen and pelvis was performed following the standard protocol without IV contrast. COMPARISON:  PET-CT 05/08/2013. FINDINGS: Lower chest: Small area of airspace consolidation in the medial aspect of the left lower lobe. Hepatobiliary: No definite cystic or solid hepatic lesions are confidently identified on today's noncontrast CT examination. Unenhanced appearance of the gallbladder is normal. Pancreas: No definite pancreatic mass or peripancreatic fluid or inflammatory changes are noted on today's noncontrast CT examination. Spleen: Unremarkable. Adrenals/Urinary Tract: There are no abnormal calcifications within the collecting system of either kidney, along the course of  either ureter, or within the lumen of the urinary bladder. No hydroureteronephrosis or perinephric stranding to suggest urinary tract obstruction at this time. The unenhanced appearance of the kidneys is unremarkable bilaterally. Unenhanced appearance of the urinary bladder is normal. Bilateral adrenal glands are normal in appearance. Stomach/Bowel: Unenhanced appearance of the stomach is normal. Multiple mildly dilated loops of small bowel with multiple air-fluid levels. There is also mild dilatation of portions of the colon with a relatively large volume of liquid stool, particularly in the right-side of the colon. Distal rectal stool and fluid also noted. Normal appendix. Vascular/Lymphatic: Aortic atherosclerosis. No lymphadenopathy noted in the abdomen or pelvis. Reproductive: Prostate gland and seminal vesicles are unremarkable in appearance. Other: No significant volume of ascites.  No pneumoperitoneum. Musculoskeletal: There are no aggressive appearing lytic or blastic lesions noted in the visualized portions of the skeleton. IMPRESSION:  1. Diffuse mild dilatation of small bowel and colon with multiple air-fluid levels throughout. Findings are suggestive of enterocolitis. 2. Small amount of airspace consolidation in the medial aspect of the left lower lobe. This is concerning for developing infection or sequela of recent aspiration. 3. Aortic atherosclerosis. Electronically Signed   By: Vinnie Langton M.D.   On: 08/01/2018 19:40   Dg Chest 2 View  Result Date: 08/01/2018 CLINICAL DATA:  Cough and fever.  History of laryngeal carcinoma EXAM: CHEST - 2 VIEW COMPARISON:  April 16, 2018 FINDINGS: Lungs are clear. The heart size and pulmonary vascularity are normal. No adenopathy. No bone lesions. Visualized trachea appears unremarkable. IMPRESSION: No edema or consolidation.  No neoplastic focus evident. Electronically Signed   By: Lowella Grip III M.D.   On: 08/01/2018 15:34      Assessment/Plan   [name] is a 53 y.o. male with medical history significant for stage III chronic kidney disease with baseline creatinine of 1.50, tobacco abuse, hypothyroidism, who is admitted to [facility] long hospital on 08/01/2018 with severe sepsis due to suspected left lower lobe pneumonia after presenting to the [facility] long emergency department complaining of subjective fever/chills.   Principal Problem:   Severe sepsis ([place]) Active Problems:   Tobacco use disorder   Influenza B   Left lower lobe pneumonia ([place])   AKI (acute kidney injury) ([place])    #) Severe sepsis due to suspected left lower lobe pneumonia: Diagnosis on the basis of presenting subjective fever, chills, new onset productive cough, in CT demonstration of left lower lobe infiltrate concerning for pneumonia.  Sirs criteria met via objective fever, tachycardia, and tachypnea.  Criteria are met for patient's sepsis to be considered severe nature on the basis of evidence of associated endorgan damage in the form of concomitant presenting acute kidney injury as well as elevated initial lactic acid of 2.45.  Consequently, presentation was considered a code sepsis, the patient received a 30 mL/kg IVF bolus in the emergency department as well cefepime, Flagyl, and IV vancomycin, preceded by obtainment of blood cultures x2.  Following IV fluid bolus, repeat lactic acid had improved to 1.50.  Plan: We will continue broad-spectrum antibiotics in the form of IV vancomycin as well as Zosyn, with the latter also providing anaerobic coverage in the setting of clinical and radiographic suspicion for enterocolitis, as further describe below.  MRSA PCR is currently pending, with plan to discontinue IV vancomycin if ensuing result found to be negative.  Will monitor closely for results of blood cultures x2 collected emergency department this evening.  Check sputum culture.  Repeat CBC with differential in the morning.  IV normal  saline at 125 cc/h.  PRN albuterol inhaler.  Given concomitant presenting GI symptoms, also check Legionella urine antigen.  PRN supplemental oxygen order to maintain O2 sats greater than or equal to 92%.    #) Influenza B: Diagnosis on the basis of presenting objective fever, chills, generalized myalgias, productive cough, mild shortness of breath, and influenza PCR positive for influenza B.  As the patient's respiratory symptoms have been occurring for 4 to 5 days, he is outside of the therapeutic window for administration of Tamiflu, but would likely still benefit from supportive measures, as outlined below.  Plan: IV fluids as above.  PRN albuterol nebulizer as above.  PRN supplemental oxygen, as above.  Repeat CBC with differential in the morning.    #) Acute kidney injury: In the setting of known stage III chronic kidney  disease with baseline creatinine of 1.50, presenting labs reflect an elevated creatinine of 4.88, this is relative to most recent prior creatinine value of 1.5 on 04/16/2018.  Suspect that this is prerenal in nature in the setting of intravascular depletion due to dehydration on the basis of recent increase in GI losses as well as recent diminished oral intake.  Of note, presenting urinalysis was not associated with any urinary casts.  Of note, CPK was checked in the ED, and found to be 220.  Plan: We will continue IV normal saline at 125 cc/h.  Monitor strict I's and O's.  Attempt avoid nephrotoxic agents.  Repeat BMP in the morning.  We will also check random urine sodium as well as random urine creatinine.  Work-up and management of severe sepsis due to suspected left lower lobe pneumonia as well as influenza B, as prescribed above.    #) Cocaine abuse: Presenting urinary drug screen noted to be positive for cocaine.  Plan: Counseled the patient on the importance of discontinuation of cocaine use.    #) Hypothyroidism: The patient acknowledges a history of acquired  hypothyroidism, but reports that he has independently discontinued his home Synthroid.  Plan: will check TSH.     #) Chronic tobacco abuse: Patient knowledges that he is a current smoker, having smoked 1 pack/day for greater than 30 years, although he acknowledges recent reduction in his smoking habits down to about half pack per day.  Plan: Counseled the patient on the importance of complete smoking discontinuation.  I also ordered a PRN nicotine patch for use during this hospitalization.     DVT prophylaxis: Heparin 5000 units [place] TID Code Status: full Family Communication: (none) Disposition Plan:  Per Rounding Team Consults called: (none)  Admission status: inpatient; med-tele.    PLEASE NOTE THAT DRAGON DICTATION SOFTWARE WAS USED IN THE CONSTRUCTION OF THIS NOTE.   [facility] Pager [phone] From 6 PM - 2 AM.   Otherwise, please contact night-coverage  [email or web address] Password [email or web address]  08/01/2018, 8:05 PM

## 2018-08-02 ENCOUNTER — Other Ambulatory Visit: Payer: Self-pay

## 2018-08-02 DIAGNOSIS — R6520 Severe sepsis without septic shock: Secondary | ICD-10-CM

## 2018-08-02 DIAGNOSIS — J189 Pneumonia, unspecified organism: Secondary | ICD-10-CM | POA: Diagnosis present

## 2018-08-02 DIAGNOSIS — J181 Lobar pneumonia, unspecified organism: Secondary | ICD-10-CM

## 2018-08-02 DIAGNOSIS — A419 Sepsis, unspecified organism: Secondary | ICD-10-CM | POA: Diagnosis present

## 2018-08-02 DIAGNOSIS — N179 Acute kidney failure, unspecified: Secondary | ICD-10-CM | POA: Diagnosis present

## 2018-08-02 LAB — BASIC METABOLIC PANEL
Anion gap: 9 (ref 5–15)
BUN: 64 mg/dL — ABNORMAL HIGH (ref 6–20)
CO2: 14 mmol/L — ABNORMAL LOW (ref 22–32)
CREATININE: 4.39 mg/dL — AB (ref 0.61–1.24)
Calcium: 7.1 mg/dL — ABNORMAL LOW (ref 8.9–10.3)
Chloride: 110 mmol/L (ref 98–111)
GFR calc Af Amer: 16 mL/min — ABNORMAL LOW (ref >60)
GFR calc non Af Amer: 14 mL/min — ABNORMAL LOW (ref >60)
Glucose, Bld: 88 mg/dL (ref 70–99)
Potassium: 4.4 mmol/L (ref 3.5–5.1)
Sodium: 133 mmol/L — ABNORMAL LOW (ref 135–145)

## 2018-08-02 LAB — EXPECTORATED SPUTUM ASSESSMENT W REFEX TO RESP CULTURE

## 2018-08-02 LAB — CBC
HCT: 41.3 % (ref 39.0–52.0)
Hemoglobin: 13.6 g/dL (ref 13.0–17.0)
MCH: 32.1 pg (ref 26.0–34.0)
MCHC: 32.9 g/dL (ref 30.0–36.0)
MCV: 97.4 fL (ref 80.0–100.0)
Platelets: 108 10*3/uL — ABNORMAL LOW (ref 150–400)
RBC: 4.24 MIL/uL (ref 4.22–5.81)
RDW: 13.2 % (ref 11.5–15.5)
WBC: 8.4 10*3/uL (ref 4.0–10.5)
nRBC: 0 % (ref 0.0–0.2)

## 2018-08-02 LAB — CREATININE, URINE, RANDOM: Creatinine, Urine: 132.2 mg/dL

## 2018-08-02 LAB — TSH: TSH: 55.957 u[IU]/mL — ABNORMAL HIGH (ref 0.350–4.500)

## 2018-08-02 LAB — MAGNESIUM: Magnesium: 1.8 mg/dL (ref 1.7–2.4)

## 2018-08-02 LAB — EXPECTORATED SPUTUM ASSESSMENT W GRAM STAIN, RFLX TO RESP C

## 2018-08-02 LAB — SODIUM, URINE, RANDOM: Sodium, Ur: 59 mmol/L

## 2018-08-02 LAB — STREP PNEUMONIAE URINARY ANTIGEN: Strep Pneumo Urinary Antigen: NEGATIVE

## 2018-08-02 LAB — VANCOMYCIN, RANDOM: Vancomycin Rm: 9

## 2018-08-02 MED ORDER — ZOLPIDEM TARTRATE 5 MG PO TABS
5.0000 mg | ORAL_TABLET | Freq: Every evening | ORAL | Status: DC | PRN
  Administered 2018-08-05: 5 mg via ORAL
  Filled 2018-08-02: qty 1

## 2018-08-02 MED ORDER — ENSURE ENLIVE PO LIQD: 237.0000 mL | Freq: Two times a day (BID) | ORAL | Status: DC

## 2018-08-02 MED ORDER — ADULT MULTIVITAMIN W/MINERALS CH
1.0000 | ORAL_TABLET | Freq: Every day | ORAL | Status: DC
  Administered 2018-08-02 – 2018-08-05 (×4): 1 via ORAL
  Filled 2018-08-02 (×4): qty 1

## 2018-08-02 MED ORDER — TRAMADOL HCL 50 MG PO TABS
50.0000 mg | ORAL_TABLET | Freq: Four times a day (QID) | ORAL | Status: DC | PRN
  Administered 2018-08-02 – 2018-08-04 (×4): 50 mg via ORAL
  Filled 2018-08-02 (×4): qty 1

## 2018-08-02 MED ORDER — BOOST / RESOURCE BREEZE PO LIQD CUSTOM
1.0000 | ORAL | Status: DC
  Administered 2018-08-03: 1 via ORAL

## 2018-08-02 NOTE — Progress Notes (Signed)
PROGRESS NOTE    [name]  XNA:[id number] DOB: 1966/03/06 DOA: 08/01/2018 PCP: Patient, No Pcp Per (Confirm with patient/family/NH records and if not entered, this HAS to be entered at [facility] point of entry. "No PCP" if truly none.)   Brief Narrative: 53 y.o. male with medical history significant for stage III chronic kidney disease with baseline creatinine of 1.50, tobacco abuse, hypothyroidism, who is admitted to [facility] long hospital on 08/01/2018 with severe sepsis due to suspected left lower lobe pneumonia after presenting to the [facility] long emergency department complaining of subjective fever/chills.  The patient reports 4 to 5 days of subjective fever, chills, and generalized myalgias.  Over that time he notes associated productive cough in the absence of any hemoptysis. Over that timeframe he also reports nausea resulting in approximately 5 episodes of nonbloody, nonbilious emesis as well as 5 episodes of watery, loose stool in the absence of any associated melena or hematochezia.  He notes associated mild, bilateral lower abdominal quadrant discomfort, which he describes as crampy in nature.  No recent trauma or travel.  The patient reports that he lives with his brother, who has been experiencing a very similar constellation of symptoms.  The patient denies any associated neck stiffness, dysuria, gross hematuria, or urinary urgency/frequency. ED Course: Vital signs in the emergency department were notable for the following: Immature 102.6; heart rate 104; respiratory rate 16-23, initial blood pressure 87/65, which is improved to 97/68 following interval IV fluids as further described below and oxygen saturation 95 to 100% on room air.  Labs from in the ED were notable for the following: CMP notable for creatinine of 4.88 relative to most recent prior creatinine value of 1.5 on 04/16/2018; lipase 77; CPK 220.  CBC notable for white blood cell count of 10,000 with 90% neutrophils.  Initial  lactic acid 2.45, which improved to 1.50 following interval IV fluid administration, as per the scribe below.  Urinalysis showed 11-20 white blood cells, rare bacteria, and was nitrite negative as well as leukocyte Estrace negative.  Blood cultures x2 were collected prior to initiation of any antibiotics.  Rapid influenza found to be positive for influenza B.  Urinary drug screen was positive for cocaine.  CT abdomen/pelvis, per final radiology report, showed evidence of left lower lobe infiltrate concerning for pneumonia as well as findings suggestive of enterocolitis in the absence of evidence of obstruction, abscess, or bowel perforation.  While in the emergency department, the following were administered: IV normal saline x2.5 L bolus, followed by initiation of continuous IV normal saline at 125 cc/h, cefepime 2 g IV x1, Flagyl 500 mg IV x1, vancomycin 1 g IV x1.  Subsequently, the patient was admitted to the med telemetry floor for further evaluation and management of presenting severe sepsis due to suspected left lower lobe pneumoni Assessment & Plan:   Principal Problem:   Severe sepsis ([place]) Active Problems:   Tobacco use disorder   Influenza B   Left lower lobe pneumonia ([place])   AKI (acute kidney injury) ([place])   #) Severe sepsis due to suspected left lower lobe pneumonia: Diagnosis on the basis of presenting subjective fever, chills, new onset productive cough, in CT demonstration of left lower lobe infiltrate concerning for pneumonia.  Sirs criteria met via objective fever, tachycardia, and tachypnea.  Criteria are met for patient's sepsis to be considered severe nature on the basis of evidence of associated endorgan damage in the form of concomitant presenting acute kidney injury as well as  elevated initial lactic acid of 2.45.  continue vanc/cefepime.   #) Influenza B: Diagnosis on the basis of presenting objective fever, chills, generalized myalgias, productive cough, mild  shortness of breath, and influenza PCR positive for influenza B.  As the patient's respiratory symptoms have been occurring for 4 to 5 days, he is outside of the therapeutic window for administration of Tamiflu.  #) Acute kidney injury: In the setting of known stage III chronic kidney disease with baseline creatinine of 1.50, presenting labs reflect an elevated creatinine of 4.88, this is relative to most recent prior creatinine value of 1.5 on 04/16/2018.  Suspect that this is prerenal in nature in the setting of intravascular depletion due to dehydration on the basis of recent increase in GI losses as well as recent diminished oral intake.  Of note, presenting urinalysis was not associated with any urinary casts. Continue ns.creatinine improving.follow up in am  #cocaine abuse uds positive  #hypothyroid continue synthroid    Estimated body mass index is 18.05 kg/m as calculated from the following:   Height as of this encounter: 5' 11"  (1.803 m).   Weight as of this encounter: 58.7 kg.  DVT prophylaxis heparin Code Status: full Family Communication: none Disposition Plan: pending clinical improvement Consultants:   none   Procedures:none  Antimicrobials:  vanco zosyn Subjective: Had diarrhea  Objective: Vitals:   08/02/18 1130 08/02/18 1200 08/02/18 1324 08/02/18 1327  BP:    93/68  Pulse: 69 96  77  Resp: 17 (!) 26  18  Temp:    98.2 F (36.8 C)  TempSrc:    Oral  SpO2: 100% 100%  98%  Weight:   58.7 kg   Height:   5' 11"  (1.803 m)     Intake/Output Summary (Last 24 hours) at 08/02/2018 1353 Last data filed at 08/02/2018 0911 Gross per 24 hour  Intake 3144.08 ml  Output -  Net 3144.08 ml   Filed Weights   08/01/18 1424 08/02/18 1324  Weight: 64.9 kg 58.7 kg    Examination:  General exam: Appears calm and comfortable  Respiratory system: Clear to auscultation. Respiratory effort normal. Cardiovascular system: S1 & S2 heard, RRR. No JVD, murmurs, rubs, gallops  or clicks. No pedal edema. Gastrointestinal system: Abdomen is nondistended, soft and nontender. No organomegaly or masses felt. Normal bowel sounds heard. Central nervous system: Alert and oriented. No focal neurological deficits. Extremities: Symmetric 5 x 5 power. Skin: No rashes, lesions or ulcers Psychiatry: Judgement and insight appear normal. Mood & affect appropriate.     Data Reviewed: I have personally reviewed following labs and imaging studies  CBC: Recent Labs  Lab 08/01/18 1502 08/02/18 0420  WBC 10.0 8.4  NEUTROABS 9.0*  --   HGB 18.4* 13.6  HCT 55.9* 41.3  MCV 99.5 97.4  PLT 151 680*   Basic Metabolic Panel: Recent Labs  Lab 08/01/18 1502 08/02/18 0420  NA 136 133*  K 4.1 4.4  CL 98 110  CO2 19* 14*  GLUCOSE 86 88  BUN 67* 64*  CREATININE 4.88* 4.39*  CALCIUM 8.0* 7.1*  MG  --  1.8   GFR: Estimated Creatinine Clearance: 16 mL/min (A) (by C-G formula based on SCr of 4.39 mg/dL (H)). Liver Function Tests: Recent Labs  Lab 08/01/18 1502  AST 47*  ALT 23  ALKPHOS 56  BILITOT 0.5  PROT 7.9  ALBUMIN 4.4   Recent Labs  Lab 08/01/18 1500  LIPASE 77*   No results for input(s): AMMONIA  in the last 168 hours. Coagulation Profile: No results for input(s): INR, PROTIME in the last 168 hours. Cardiac Enzymes: Recent Labs  Lab 08/01/18 1523  CKTOTAL 220   BNP (last 3 results) No results for input(s): PROBNP in the last 8760 hours. HbA1C: No results for input(s): HGBA1C in the last 72 hours. CBG: No results for input(s): GLUCAP in the last 168 hours. Lipid Profile: No results for input(s): CHOL, HDL, LDLCALC, TRIG, CHOLHDL, LDLDIRECT in the last 72 hours. Thyroid Function Tests: Recent Labs    08/02/18 0420  TSH 55.957*   Anemia Panel: No results for input(s): VITAMINB12, FOLATE, FERRITIN, TIBC, IRON, RETICCTPCT in the last 72 hours. Sepsis Labs: Recent Labs  Lab 08/01/18 1452 08/01/18 1905  LATICACIDVEN 2.45* 1.50    Recent  Results (from the past 240 hour(s))  Blood Culture (routine x 2)     Status: None (Preliminary result)   Collection Time: 08/01/18  3:02 PM  Result Value Ref Range Status   Specimen Description   Final    BLOOD RIGHT ANTECUBITAL Performed at [facility], [place] [address] [place], [place] [address]    Special Requests   Final    BOTTLES DRAWN AEROBIC AND ANAEROBIC Blood Culture adequate volume Performed at [place] [address], [place], [place] [address]    Culture   Final    NO GROWTH < 12 HOURS Performed at [facility] [address] [place], [place] [address]    Report Status PENDING  Incomplete  Blood Culture (routine x 2)     Status: None (Preliminary result)   Collection Time: 08/01/18  3:03 PM  Result Value Ref Range Status   Specimen Description   Final    BLOOD LEFT ANTECUBITAL Performed at [facility] [address] [place], [place] [address]    Special Requests   Final    BOTTLES DRAWN AEROBIC AND ANAEROBIC Blood Culture adequate volume Performed at [facility] [address], [place], [place] [address]    Culture   Final    NO GROWTH < 12 HOURS Performed at [facility] [address] [place], [place] [address]    Report Status PENDING  Incomplete  MRSA PCR Screening     Status: None   Collection Time: 08/01/18  3:03 PM  Result Value Ref Range Status   MRSA by PCR NEGATIVE NEGATIVE Final    Comment:        The GeneXpert MRSA Assay (FDA approved for NASAL specimens only), is one component of a comprehensive MRSA colonization surveillance program. It is not intended to diagnose MRSA infection nor to guide or monitor treatment for MRSA infections. Performed at [facility], [place] [address], [place], [place] [address]   Expectorated sputum assessment w rflx to resp cult     Status: None   Collection Time: 08/02/18  9:46 AM  Result Value  Ref Range Status   Specimen Description SPUTUM  Final   Special Requests NONE  Final   Sputum evaluation   Final    THIS SPECIMEN IS ACCEPTABLE FOR SPUTUM CULTURE Performed at [facility], [place] [address] [place],  [address]    Report Status 08/02/2018 FINAL  Final         Radiology Studies: Ct Abdomen Pelvis Wo Contrast  Result Date: 08/01/2018 CLINICAL DATA:  53 year old male with history of nausea, vomiting and diarrhea. Fever, chills and cough for  the past 4 days. EXAM: CT ABDOMEN AND PELVIS WITHOUT CONTRAST TECHNIQUE: Multidetector CT imaging of the abdomen and pelvis was performed following the standard protocol without IV contrast. COMPARISON:  PET-CT 05/08/2013. FINDINGS: Lower chest: Small area of airspace consolidation in the medial aspect of the left lower lobe. Hepatobiliary: No definite cystic or solid hepatic lesions are confidently identified on today's noncontrast CT examination. Unenhanced appearance of the gallbladder is normal. Pancreas: No definite pancreatic mass or peripancreatic fluid or inflammatory changes are noted on today's noncontrast CT examination. Spleen: Unremarkable. Adrenals/Urinary Tract: There are no abnormal calcifications within the collecting system of either kidney, along the course of either ureter, or within the lumen of the urinary bladder. No hydroureteronephrosis or perinephric stranding to suggest urinary tract obstruction at this time. The unenhanced appearance of the kidneys is unremarkable bilaterally. Unenhanced appearance of the urinary bladder is normal. Bilateral adrenal glands are normal in appearance. Stomach/Bowel: Unenhanced appearance of the stomach is normal. Multiple mildly dilated loops of small bowel with multiple air-fluid levels. There is also mild dilatation of portions of the colon with a relatively large volume of liquid stool, particularly in the right-side of the colon. Distal rectal stool and fluid also  noted. Normal appendix. Vascular/Lymphatic: Aortic atherosclerosis. No lymphadenopathy noted in the abdomen or pelvis. Reproductive: Prostate gland and seminal vesicles are unremarkable in appearance. Other: No significant volume of ascites.  No pneumoperitoneum. Musculoskeletal: There are no aggressive appearing lytic or blastic lesions noted in the visualized portions of the skeleton. IMPRESSION: 1. Diffuse mild dilatation of small bowel and colon with multiple air-fluid levels throughout. Findings are suggestive of enterocolitis. 2. Small amount of airspace consolidation in the medial aspect of the left lower lobe. This is concerning for developing infection or sequela of recent aspiration. 3. Aortic atherosclerosis. Electronically Signed   By: Vinnie Langton M.D.   On: 08/01/2018 19:40   Dg Chest 2 View  Result Date: 08/01/2018 CLINICAL DATA:  Cough and fever.  History of laryngeal carcinoma EXAM: CHEST - 2 VIEW COMPARISON:  April 16, 2018 FINDINGS: Lungs are clear. The heart size and pulmonary vascularity are normal. No adenopathy. No bone lesions. Visualized trachea appears unremarkable. IMPRESSION: No edema or consolidation.  No neoplastic focus evident. Electronically Signed   By: Lowella Grip III M.D.   On: 08/01/2018 15:34        Scheduled Meds: . heparin  5,000 Units Subcutaneous Q8H  . vancomycin variable dose per unstable renal function (pharmacist dosing)   Does not apply See admin instructions   Continuous Infusions: . sodium chloride 125 mL/hr at 08/02/18 1327  . piperacillin-tazobactam (ZOSYN)  IV Stopped (08/02/18 1114)     LOS: 1 day       Georgette Shell, MD [facility]  If 7PM-7AM, please contact night-coverage [email or web address] Password [email or web address] 08/02/2018, 1:53 PM

## 2018-08-02 NOTE — Progress Notes (Signed)
Brief note regarding plan, with full H&P to follow:   [name] is a 53 y.o. male with medical history significant for stage III chronic kidney disease with baseline creatinine of 1.50, tobacco abuse, hypothyroidism, who is admitted to [facility] long hospital on 08/01/2018 with severe sepsis due to suspected left lower lobe pneumonia after presenting to the [facility] long emergency department complaining of subjective fever/chills.   #) Severe sepsis due to suspected left lower lobe pneumonia: Diagnosis on the basis of presenting subjective fever, chills, new onset productive cough, in CT demonstration of left lower lobe infiltrate concerning for pneumonia.  Sirs criteria met via objective fever, tachycardia, and tachypnea.  Criteria are met for patient's sepsis to be considered severe nature on the basis of evidence of associated endorgan damage in the form of concomitant presenting acute kidney injury as well as elevated initial lactic acid of 2.45.  Consequently, presentation was considered a code sepsis, the patient received a 30 mL/kg IVF bolus in the emergency department as well cefepime, Flagyl, and IV vancomycin, preceded by obtainment of blood cultures x2.  Following IV fluid bolus, repeat lactic acid had improved to 1.50.  Plan: We will continue broad-spectrum antibiotics in the form of IV vancomycin as well as Zosyn, with the latter also providing anaerobic coverage in the setting of clinical and radiographic suspicion for enterocolitis, as further describe below.  MRSA PCR is currently pending, with plan to discontinue IV vancomycin if ensuing result found to be negative.  Will monitor closely for results of blood cultures x2 collected emergency department this evening.  Check sputum culture.  Repeat CBC with differential in the morning.  IV normal saline at 125 cc/h.  PRN albuterol inhaler.  Given concomitant presenting GI symptoms, also check Legionella urine antigen.  PRN supplemental oxygen  order to maintain O2 sats greater than or equal to 92%.    Babs Bertin, DO Hospitalist

## 2018-08-02 NOTE — Progress Notes (Signed)
Initial Nutrition Assessment  DOCUMENTATION CODES:   (Will assess for malnutrition at follow-up. )  INTERVENTION:  - Will order Ensure Enlive BID, each supplement provides 350 kcal and 20 grams of protein. - Will order Boost Breeze once/day, this supplement provides 250 kcal and 9 grams of protein. - Will order daily multivitamin with minerals.  - Continue to encourage PO intakes.    NUTRITION DIAGNOSIS:   Increased nutrient needs related to acute illness(severe sepsis) as evidenced by estimated needs.  GOAL:   Patient will meet greater than or equal to 90% of their needs  MONITOR:   PO intake, Supplement acceptance, Weight trends, Labs  REASON FOR ASSESSMENT:   Malnutrition Screening Tool  ASSESSMENT:   53 y.o. male with medical history significant for CKD stage 3 with baseline creatinine of 1.50, tobacco abuse, and hypothyroidism. He was admitted to [facility] on 1/9with severe sepsis d/t suspected LLL PNA after presenting to the ED complaining of subjective fever/chills.  BMI indicates underweight status. Will monitor closely as it appears that weight today is recorded as 129 lb and weight yesterday was recorded as 143 lb. Weight on 9/24 was 159 lb. Will determine scope of weight loss at follow-up once a new weight can again be obtained.   Unable to see patient at time of attempted visit; patient recently moved from the floor to [address]. No intakes documented.   Per Dr. Brett Albino note overnight: severe sepsis suspected d/t LLL PNA, SIRS, AKI.   Medications reviewed. Labs reviewed; Na: 133 mmol/L, BUN: 64 mg/dL, creatinine: 4.39 mg/dL, Ca: 7.1 mg/dL, GFR: 14 mL/min.  IVF; NS @ 125 mL/hr.      NUTRITION - FOCUSED PHYSICAL EXAM:  Will attempt at follow-up.   Diet Order:   Diet Order            Diet regular Room service appropriate? Yes; Fluid consistency: Thin  Diet effective now              EDUCATION NEEDS:   Not appropriate for education at this time  Skin:   Skin Assessment: Reviewed RN Assessment  Last BM:  1/10  Height:   Ht Readings from Last 1 Encounters:  08/02/18 5\' 11"  (1.803 m)    Weight:   Wt Readings from Last 1 Encounters:  08/02/18 58.7 kg    Ideal Body Weight:  78.18 kg  BMI:  Body mass index is 18.05 kg/m.  Estimated Nutritional Needs:   Kcal:  1900-2100 kcal  Protein:  80-90 grams  Fluid:  >/= 2 L/day     Jarome Matin, MS, RD, LDN, [facility] Inpatient Clinical Dietitian Pager # [phone] After hours/weekend pager # [phone]

## 2018-08-02 NOTE — ED Notes (Signed)
Report called to receiving RN.

## 2018-08-03 DIAGNOSIS — F17200 Nicotine dependence, unspecified, uncomplicated: Secondary | ICD-10-CM

## 2018-08-03 DIAGNOSIS — N179 Acute kidney failure, unspecified: Secondary | ICD-10-CM

## 2018-08-03 DIAGNOSIS — J101 Influenza due to other identified influenza virus with other respiratory manifestations: Secondary | ICD-10-CM

## 2018-08-03 LAB — LEGIONELLA PNEUMOPHILA SEROGP 1 UR AG: L. pneumophila Serogp 1 Ur Ag: NEGATIVE

## 2018-08-03 LAB — CREATININE, SERUM
Creatinine, Ser: 3.45 mg/dL — ABNORMAL HIGH (ref 0.61–1.24)
GFR calc Af Amer: 22 mL/min — ABNORMAL LOW (ref >60)
GFR calc non Af Amer: 19 mL/min — ABNORMAL LOW (ref >60)

## 2018-08-03 MED ORDER — METHYLPREDNISOLONE SODIUM SUCC 40 MG IJ SOLR
40.0000 mg | INTRAMUSCULAR | Status: DC
  Administered 2018-08-03 – 2018-08-04 (×2): 40 mg via INTRAVENOUS
  Filled 2018-08-03 (×2): qty 1

## 2018-08-03 MED ORDER — LOPERAMIDE HCL 2 MG PO CAPS
2.0000 mg | ORAL_CAPSULE | ORAL | Status: DC | PRN
  Administered 2018-08-03: 2 mg via ORAL
  Filled 2018-08-03: qty 1

## 2018-08-03 MED ORDER — IPRATROPIUM-ALBUTEROL 0.5-2.5 (3) MG/3ML IN SOLN
3.0000 mL | Freq: Two times a day (BID) | RESPIRATORY_TRACT | Status: DC
  Administered 2018-08-03 – 2018-08-05 (×4): 3 mL via RESPIRATORY_TRACT
  Filled 2018-08-03 (×4): qty 3

## 2018-08-03 MED ORDER — ALBUTEROL SULFATE (2.5 MG/3ML) 0.083% IN NEBU: 2.5000 mg | INHALATION_SOLUTION | RESPIRATORY_TRACT | Status: DC | PRN

## 2018-08-03 MED ORDER — IPRATROPIUM-ALBUTEROL 0.5-2.5 (3) MG/3ML IN SOLN
3.0000 mL | Freq: Four times a day (QID) | RESPIRATORY_TRACT | Status: DC
  Administered 2018-08-03: 3 mL via RESPIRATORY_TRACT
  Filled 2018-08-03: qty 3

## 2018-08-03 MED ORDER — FLUTICASONE PROPIONATE 50 MCG/ACT NA SUSP
2.0000 | Freq: Two times a day (BID) | NASAL | Status: DC
  Administered 2018-08-03 – 2018-08-05 (×5): 2 via NASAL
  Filled 2018-08-03: qty 16

## 2018-08-03 MED ORDER — SALINE SPRAY 0.65 % NA SOLN
1.0000 | Freq: Four times a day (QID) | NASAL | Status: DC
  Administered 2018-08-03 – 2018-08-05 (×8): 1 via NASAL
  Filled 2018-08-03: qty 44

## 2018-08-03 MED ORDER — BENZONATATE 100 MG PO CAPS
100.0000 mg | ORAL_CAPSULE | Freq: Three times a day (TID) | ORAL | Status: DC | PRN
  Administered 2018-08-03: 100 mg via ORAL
  Filled 2018-08-03: qty 1

## 2018-08-03 MED ORDER — GUAIFENESIN-DM 100-10 MG/5ML PO SYRP
5.0000 mL | ORAL_SOLUTION | Freq: Four times a day (QID) | ORAL | Status: DC
  Administered 2018-08-03 – 2018-08-05 (×7): 5 mL via ORAL
  Filled 2018-08-03 (×7): qty 10

## 2018-08-03 NOTE — Progress Notes (Signed)
PT Cancellation Note  Patient Details Name: [name] MRN: [id number] DOB: [birth year]   Cancelled Treatment:    Reason Eval/Treat Not Completed: Fatigue/lethargy limiting ability to participate, pt. Reports ambulating to BR, now feels dizzy and weak. Will check back tomorrow.   Claretha Cooper 08/03/2018, 3:10 PM  [place] Pager [phone] Office [phone]

## 2018-08-03 NOTE — Progress Notes (Signed)
PROGRESS NOTE    [name]  VQQ:[id number] DOB: 07-24-1966 DOA: 08/01/2018 PCP: Patient, No Pcp Per    Brief Narrative:  53 year old male who presented with fevers.  He does have significant past medical history for chronic kidney disease stage III, tobacco abuse and hypothyroidism.  Patient reported 4 to 5 days of subjective fevers, chills and generalized myalgias.  Positive productive cough, associated with vomiting.  On his initial physical examination his temperature was 102.6, heart rate 104, respiratory rate 23, blood pressure 87/65, oxygen saturation 95%.  Dry mucous membranes, heart S1-S2 present, rhythmic tachycardic, lungs with left basilar rales, no wheezing, abdomen soft nontender, nondistended, no lower extremity edema.  CT chest with left lower lobe infiltrate, influenza B positive.   Patient was admitted to the hospital with the working diagnosis of sepsis due to left lower lobe pneumonia.   Assessment & Plan:   Principal Problem:   Severe sepsis ([place]) Active Problems:   Tobacco use disorder   Influenza B   Left lower lobe pneumonia ([place])   AKI (acute kidney injury) ([place])   1. Sepsis due to left lower lobe pneumonia due to influenza B. Chest film personally reviewed, noted hyperinflation, no significant infiltrate, CT chest with small infiltrate at the left base. Patient with tobacco abuse and hx of throat cancer, high pretest for COPD, will start patient on aggressive bronchodilator therapy and systemic steroids for COPD exacerbation. Continue oxymetry monitoring and will add guaifenesin and nasal decongestant with fluticasone and saline spray. Will discontinue antibiotic therapy for now. Will discontinue cardiac monitoring.   2. AKI on CKD stage 3. Patient is tolerating well po diet, will discontinue IV fluids and will follow on renal panel in am. Serum cr 3,45 this am. Avoid nephrotoxic medications.   3. Tobacco abuse. Smoking cessation counseling, nicotine patch.    4. Diarrhea. Will discontinue antibiotic therapy and will add as needed loperamide.   5. Calorie protein malnutrition. Continue nutritional supplements.    DVT prophylaxis: heparin   Code Status: full Family Communication: no family at the bedside  Disposition Plan/ discharge barriers: Pending clinical improvement.   Body mass index is 18.05 kg/m. Malnutrition Type:  Nutrition Problem: Increased nutrient needs Etiology: acute illness(severe sepsis)   Malnutrition Characteristics:  Signs/Symptoms: estimated needs   Nutrition Interventions:  Interventions: Ensure Enlive (each supplement provides 350kcal and 20 grams of protein), Boost Breeze, MVI  RN Pressure Injury Documentation:     Consultants:     Procedures:     Antimicrobials:       Subjective: Patient continue to have significant dyspnea, associated with productive cough and sinus congestion with decreased hearing on the right. Positive wheezing, no nausea or vomiting. Positive smoking, and history of throat cancer.   Objective: Vitals:   08/02/18 1324 08/02/18 1327 08/02/18 2055 08/03/18 0526  BP:  93/68 102/66 106/67  Pulse:  77 74 66  Resp:  18 16 18   Temp:  98.2 F (36.8 C) 99 F (37.2 C) 98.4 F (36.9 C)  TempSrc:  Oral Oral Oral  SpO2:  98% 97% 97%  Weight: 58.7 kg     Height: 5\' 11"  (1.803 m)       Intake/Output Summary (Last 24 hours) at 08/03/2018 1054 Last data filed at 08/03/2018 0809 Gross per 24 hour  Intake 1551.18 ml  Output 1600 ml  Net -48.82 ml   Filed Weights   08/01/18 1424 08/02/18 1324  Weight: 64.9 kg 58.7 kg    Examination:  General: deconditioned and ill looking appearing  Neurology: Awake and alert, non focal  E ENT: mild pallor, no icterus, oral mucosa moist Cardiovascular: No JVD. S1-S2 present, rhythmic, no gallops, rubs, or murmurs. No lower extremity edema. Pulmonary: decreased breath sounds bilaterally, poor air movement, no wheezing, scattered  rhonchi and rales. Gastrointestinal. Abdomen with no organomegaly, non tender, no rebound or guarding Skin. No rashes Musculoskeletal: no joint deformities     Data Reviewed: I have personally reviewed following labs and imaging studies  CBC: Recent Labs  Lab 08/01/18 1502 08/02/18 0420  WBC 10.0 8.4  NEUTROABS 9.0*  --   HGB 18.4* 13.6  HCT 55.9* 41.3  MCV 99.5 97.4  PLT 151 003*   Basic Metabolic Panel: Recent Labs  Lab 08/01/18 1502 08/02/18 0420 08/03/18 0553  NA 136 133*  --   K 4.1 4.4  --   CL 98 110  --   CO2 19* 14*  --   GLUCOSE 86 88  --   BUN 67* 64*  --   CREATININE 4.88* 4.39* 3.45*  CALCIUM 8.0* 7.1*  --   MG  --  1.8  --    GFR: Estimated Creatinine Clearance: 20.3 mL/min (A) (by C-G formula based on SCr of 3.45 mg/dL (H)). Liver Function Tests: Recent Labs  Lab 08/01/18 1502  AST 47*  ALT 23  ALKPHOS 56  BILITOT 0.5  PROT 7.9  ALBUMIN 4.4   Recent Labs  Lab 08/01/18 1500  LIPASE 77*   No results for input(s): AMMONIA in the last 168 hours. Coagulation Profile: No results for input(s): INR, PROTIME in the last 168 hours. Cardiac Enzymes: Recent Labs  Lab 08/01/18 1523  CKTOTAL 220   BNP (last 3 results) No results for input(s): PROBNP in the last 8760 hours. HbA1C: No results for input(s): HGBA1C in the last 72 hours. CBG: No results for input(s): GLUCAP in the last 168 hours. Lipid Profile: No results for input(s): CHOL, HDL, LDLCALC, TRIG, CHOLHDL, LDLDIRECT in the last 72 hours. Thyroid Function Tests: Recent Labs    08/02/18 0420  TSH 55.957*   Anemia Panel: No results for input(s): VITAMINB12, FOLATE, FERRITIN, TIBC, IRON, RETICCTPCT in the last 72 hours.    Radiology Studies: I have reviewed all of the imaging during this hospital visit personally     Scheduled Meds: . feeding supplement  1 Container Oral Q24H  . feeding supplement (ENSURE ENLIVE)  237 mL Oral BID BM  . heparin  5,000 Units Subcutaneous  Q8H  . multivitamin with minerals  1 tablet Oral Daily   Continuous Infusions: . sodium chloride 20 mL/hr at 08/03/18 0521  . piperacillin-tazobactam (ZOSYN)  IV 2.25 g (08/03/18 1034)     LOS: 2 days        Mauricio Gerome Apley, MD [facility] Pager [phone]

## 2018-08-04 DIAGNOSIS — J181 Lobar pneumonia, unspecified organism: Secondary | ICD-10-CM

## 2018-08-04 LAB — BASIC METABOLIC PANEL
ANION GAP: 6 (ref 5–15)
BUN: 40 mg/dL — ABNORMAL HIGH (ref 6–20)
CHLORIDE: 117 mmol/L — AB (ref 98–111)
CO2: 16 mmol/L — ABNORMAL LOW (ref 22–32)
Calcium: 8 mg/dL — ABNORMAL LOW (ref 8.9–10.3)
Creatinine, Ser: 2.76 mg/dL — ABNORMAL HIGH (ref 0.61–1.24)
GFR calc Af Amer: 29 mL/min — ABNORMAL LOW (ref >60)
GFR calc non Af Amer: 25 mL/min — ABNORMAL LOW (ref >60)
Glucose, Bld: 106 mg/dL — ABNORMAL HIGH (ref 70–99)
POTASSIUM: 3.7 mmol/L (ref 3.5–5.1)
Sodium: 139 mmol/L (ref 135–145)

## 2018-08-04 LAB — CULTURE, RESPIRATORY W GRAM STAIN: Culture: NORMAL

## 2018-08-04 NOTE — Progress Notes (Signed)
PROGRESS NOTE    [name]  [id number] DOB: Oct 19, 1965 DOA: 08/01/2018 PCP: Patient, No Pcp Per    Brief Narrative:  53 year old male who presented with fevers.  He does have significant past medical history for chronic kidney disease stage III, tobacco abuse and hypothyroidism.  Patient reported 4 to 5 days of subjective fevers, chills and generalized myalgias.  Positive productive cough, associated with vomiting.  On his initial physical examination his temperature was 102.6, heart rate 104, respiratory rate 23, blood pressure 87/65, oxygen saturation 95%.  Dry mucous membranes, heart S1-S2 present, rhythmic tachycardic, lungs with left basilar rales, no wheezing, abdomen soft nontender, nondistended, no lower extremity edema.  CT chest with left lower lobe infiltrate, influenza B positive.   Patient was admitted to the hospital with the working diagnosis of sepsis due to left lower lobe pneumonia.    Assessment & Plan:   Principal Problem:   Severe sepsis ([place]) Active Problems:   Tobacco use disorder   Influenza B   Left lower lobe pneumonia ([place])   AKI (acute kidney injury) ([place])   1. Sepsis due to left lower lobe pneumonia due to influenza B, complicated with COPD exacerbation and sinusitis. Patient is responding well to bronchodilators, and systemic steroids with improvement of dyspnea and cough, not back to his baseline. Will continue fluticasone and saline nasal spry for sinus congestion, sinusitis.   2. AKI on CKD stage 3. Continue trending down serum cr, today down to 2,76 with K at 3,7 and serum bicarbonate at 16. Patient tolerating po well, no nausea or vomiting, continue to hold on IV fluids.   3. Tobacco abuse. Continue smoking cessation, tolerating well nicotine patch.   4. Diarrhea. Improved with as needed loperamide.   5. Calorie protein malnutrition. Continue nutritional supplements with good toleration.     DVT prophylaxis: enoxaparin   Code  Status:  full Family Communication: no family at the bedside  Disposition Plan/ discharge barriers: plan to discharge in am if continue to improve, patient will need social worker consult for discharge planing, homeless.   Body mass index is 17.87 kg/m. Malnutrition Type:  Nutrition Problem: Increased nutrient needs Etiology: acute illness(severe sepsis)   Malnutrition Characteristics:  Signs/Symptoms: estimated needs   Nutrition Interventions:  Interventions: Ensure Enlive (each supplement provides 350kcal and 20 grams of protein), Boost Breeze, MVI  RN Pressure Injury Documentation:     Consultants:     Procedures:     Antimicrobials:       Subjective: Patient continue to have chest pain when coughing, his sinus congestion has improved but not back to baseline, has noticed significant improvement of dyspnea with nebulizer treatments.   Objective: Vitals:   08/03/18 2022 08/03/18 2039 08/04/18 0519 08/04/18 0908  BP:  113/70 112/81   Pulse:  68 (!) 58   Resp:  16 12   Temp:  98.1 F (36.7 C) 98 F (36.7 C)   TempSrc:  Oral Oral   SpO2: 95% 98% 99% 98%  Weight:   58.1 kg   Height:        Intake/Output Summary (Last 24 hours) at 08/04/2018 1033 Last data filed at 08/04/2018 1030 Gross per 24 hour  Intake 720 ml  Output 404 ml  Net 316 ml   Filed Weights   08/01/18 1424 08/02/18 1324 08/04/18 0519  Weight: 64.9 kg 58.7 kg 58.1 kg    Examination:   General: deconditioned  Neurology: Awake and alert, non focal  E ENT: mild  pallor, no icterus, oral mucosa moist Cardiovascular: No JVD. S1-S2 present, rhythmic, no gallops, rubs, or murmurs. No lower extremity edema. Pulmonary: positive breath sounds bilaterally, decreased air movement, no wheezing, or rhonchi, scatered rales. Gastrointestinal. Abdomen with no organomegaly, non tender, no rebound or guarding Skin. No rashes Musculoskeletal: no joint deformities     Data Reviewed: I have  personally reviewed following labs and imaging studies  CBC: Recent Labs  Lab 08/01/18 1502 08/02/18 0420  WBC 10.0 8.4  NEUTROABS 9.0*  --   HGB 18.4* 13.6  HCT 55.9* 41.3  MCV 99.5 97.4  PLT 151 383*   Basic Metabolic Panel: Recent Labs  Lab 08/01/18 1502 08/02/18 0420 08/03/18 0553 08/04/18 0508  NA 136 133*  --  139  K 4.1 4.4  --  3.7  CL 98 110  --  117*  CO2 19* 14*  --  16*  GLUCOSE 86 88  --  106*  BUN 67* 64*  --  40*  CREATININE 4.88* 4.39* 3.45* 2.76*  CALCIUM 8.0* 7.1*  --  8.0*  MG  --  1.8  --   --    GFR: Estimated Creatinine Clearance: 25.1 mL/min (A) (by C-G formula based on SCr of 2.76 mg/dL (H)). Liver Function Tests: Recent Labs  Lab 08/01/18 1502  AST 47*  ALT 23  ALKPHOS 56  BILITOT 0.5  PROT 7.9  ALBUMIN 4.4   Recent Labs  Lab 08/01/18 1500  LIPASE 77*   No results for input(s): AMMONIA in the last 168 hours. Coagulation Profile: No results for input(s): INR, PROTIME in the last 168 hours. Cardiac Enzymes: Recent Labs  Lab 08/01/18 1523  CKTOTAL 220   BNP (last 3 results) No results for input(s): PROBNP in the last 8760 hours. HbA1C: No results for input(s): HGBA1C in the last 72 hours. CBG: No results for input(s): GLUCAP in the last 168 hours. Lipid Profile: No results for input(s): CHOL, HDL, LDLCALC, TRIG, CHOLHDL, LDLDIRECT in the last 72 hours. Thyroid Function Tests: Recent Labs    08/02/18 0420  TSH 55.957*   Anemia Panel: No results for input(s): VITAMINB12, FOLATE, FERRITIN, TIBC, IRON, RETICCTPCT in the last 72 hours.    Radiology Studies: I have reviewed all of the imaging during this hospital visit personally     Scheduled Meds: . feeding supplement  1 Container Oral Q24H  . feeding supplement (ENSURE ENLIVE)  237 mL Oral BID BM  . fluticasone  2 spray Each Nare BID  . guaiFENesin-dextromethorphan  5 mL Oral Q6H  . heparin  5,000 Units Subcutaneous Q8H  . ipratropium-albuterol  3 mL  Nebulization BID  . methylPREDNISolone (SOLU-MEDROL) injection  40 mg Intravenous Q24H  . multivitamin with minerals  1 tablet Oral Daily  . sodium chloride  1 spray Each Nare QID   Continuous Infusions:   LOS: 3 days        Mauricio Gerome Apley, MD [facility] Pager [phone]

## 2018-08-04 NOTE — Progress Notes (Signed)
PT Cancellation Note  Patient Details Name: [name] MRN: [id number] DOB: 07/23/1966   Cancelled Treatment:    Reason Eval/Treat Not Completed: PT screened, no needs identified, will sign off. Patient able to ambulate in room independently without assistive devices. DC from PT at this time.   Floria Raveling. Hartnett-Rands, MS, PT Per [facility] #[id number] 08/04/2018, 12:07 PM

## 2018-08-05 LAB — BASIC METABOLIC PANEL
Anion gap: 8 (ref 5–15)
BUN: 32 mg/dL — ABNORMAL HIGH (ref 6–20)
CO2: 18 mmol/L — ABNORMAL LOW (ref 22–32)
Calcium: 8.2 mg/dL — ABNORMAL LOW (ref 8.9–10.3)
Chloride: 117 mmol/L — ABNORMAL HIGH (ref 98–111)
Creatinine, Ser: 2.06 mg/dL — ABNORMAL HIGH (ref 0.61–1.24)
GFR calc Af Amer: 41 mL/min — ABNORMAL LOW (ref >60)
GFR calc non Af Amer: 35 mL/min — ABNORMAL LOW (ref >60)
Glucose, Bld: 106 mg/dL — ABNORMAL HIGH (ref 70–99)
POTASSIUM: 3.5 mmol/L (ref 3.5–5.1)
Sodium: 143 mmol/L (ref 135–145)

## 2018-08-05 MED ORDER — IPRATROPIUM-ALBUTEROL 0.5-2.5 (3) MG/3ML IN SOLN: 3.0000 mL | Freq: Four times a day (QID) | RESPIRATORY_TRACT | 0 refills | Status: DC | PRN

## 2018-08-05 MED ORDER — LEVOTHYROXINE SODIUM 175 MCG PO TABS: 175.0000 ug | ORAL_TABLET | Freq: Every day | ORAL | 0 refills | Status: DC

## 2018-08-05 NOTE — Progress Notes (Signed)
Clinical Social Worker received consult to assist patient with discharge needs. Patient stated he is homeless and has been homeless for 2 years. Patient stated he has been fighting disability since 2014 but stated he has not had any luck. Patient also stated that he has applied for Medicaid but stated he has not yet been approved and it has been months. CSW encouraged patient to reach out to the DSS worker that did his application. Patient was given a list of shelters in the [place] area to utilize for homeless needs. Patient stated he has experience in being in shelters and will contact them. CSW offered a bus or cab voucher to assist patient when discharge. Patient stated he would like a cab voucher to his friends home. CSW will give voucher to RN once patient is dressed and ready.   Rhea Pink, MSW,  [place]

## 2018-08-05 NOTE — Care Management Note (Signed)
Case Management Note  Patient Details  Name: [name] MRN: [id number] Date of Birth: 1965/09/04  Subjective/Objective: Severe  Sepsis. Homeless, no pcp, money to pay for meds, no health insurance. Provided w/pcp listing-encouraged [facility]-patient will call on own for pcp appt. Provided w/ [facility] program-override-for albuterol soln, & synthroid. Patient voiced understanding.                   Action/Plan:d/c to homeless shelter   Expected Discharge Date:  08/05/18               Expected Discharge Plan:  Homeless Shelter  In-House Referral:     Discharge planning Services  CM Consult, [facility], [facility] Program  Post Acute Care Choice:    Choice offered to:     DME Arranged:    DME Agency:     HH Arranged:    [place] Agency:     Status of Service:  Completed, signed off  If discussed at [employer], dates discussed:    Additional Comments:  Dessa Phi, RN 08/05/2018, 11:41 AM

## 2018-08-05 NOTE — Discharge Summary (Addendum)
Physician Discharge Summary  [name] POE:[id number] DOB: Dec 05, 1965 DOA: 08/01/2018  PCP: Patient, No Pcp Per  Admit date: 08/01/2018 Discharge date: 08/05/2018  Admitted From: Home  Disposition:  Home   Recommendations for Outpatient Follow-up and new medication changes:  1. Follow up with PCP in 1- week. 2. Patient placed on as needed bronchodilators. 3. Smoking cessation counseling. 4. Refilled prescription for levothyroxine.   Home Health: no   Equipment/Devices: nebulizer machine    Discharge Condition: stable  CODE STATUS: full  Diet recommendation: regular.   Brief/Interim Summary: 53 year old male who presented with fevers. He does have significant past medical history for chronic kidney disease stage III, tobacco abuse and hypothyroidism. Patient reported 4 to 5 days of subjective fevers, chills and generalized myalgias. Positive productive cough, associated with vomiting. On his initial physical examination his temperature was 102.6, heart rate 104, respiratory rate 23, blood pressure 87/65, oxygen saturation 95%. Dry mucous membranes, heart S1-S2 present, rhythmic tachycardic, lungs with left basilar rales, no wheezing, abdomen soft nontender, nondistended, no lower extremity edema.  Sodium 136, potassium 4.1, chloride 98, bicarb 19, glucose 86, BUN 67, creatinine 4.8, white count 10.0, hemoglobin 18.4, hematocrit 55.9, platelets 151. Urine analysis negative for infection.  Drug screen positive for cocaine and tetrahydrocannabinol.  Chest x-ray with hyperinflation, no infiltrates. CT chest with left lower lobe infiltrate, influenza B positive.  EKG sinus rhythm, normal axis, normal intervals.  Patient was admitted to the hospital with the working diagnosis of sepsis due to left lower lobe pneumonia, complicated by acute kidney injury and chronic kidney disease.  1.  Sepsis due to left lower lobe community-acquired pneumonia, due to influenza B virus. (Present on  admission) Patient was admitted to the medical ward, he received IV fluids and IV antibiotic therapy.  Received supplemental oxygen per nasal cannula and had oximetry monitoring.  He completed antibiotic therapy in the hospital.  2.  Acute COPD exacerbation, sinusitis.  Patient received bronchodilator therapy with DuoNeb and systemic steroids with methylprednisolone with improvement of his symptoms, he also received nasal fluticasone and hypertonic saline.  By the time of discharge his symptoms have significantly improved, patient was advised about smoking cessation, social services were consulted to facilitate nebulizer machine and bronchodilator use at home.  His oximetry saturation was 99 to 100% on room air.  3.  Acute kidney injury chronic kidney disease stage III.  Patient received isotonic saline with improvement of his kidney function, nephrotoxic agents were avoided.  His discharge creatinine is 2.0, potassium 3.5 and serum bicarbonate of 18.  Recommend outpatient follow-up of kidney function.  4.  Tobacco abuse.  Smoking cessation counseling, patient received nicotine patch while in hospital.  5.  Antibiotic induced diarrhea.  Antibiotics were discontinued with improvement of diarrhea, he received as needed loperamide.  6.  Calorie protein malnutrition/ undetermined.  Patient received nutritional supplements.  He is homeless, social services were consulted to arrange appropriate follow-up.  7. Hypothyroid. Patient was not able to afford levothyroxine, new prescription was filled and social services was consulted for assistance.   Discharge Diagnoses:  Principal Problem:   Severe sepsis [facility]) Active Problems:   Tobacco use disorder   Influenza B   Left lower lobe pneumonia ([place])   AKI (acute kidney injury) ([place])    Discharge Instructions   Allergies as of 08/05/2018      Reactions   Fentanyl Nausea And Vomiting   Citalopram Other (See Comments)   Dizziness and shakiness.    Morphine  And Related Nausea And Vomiting      Medication List    STOP taking these medications   BC HEADACHE PO   naloxone 2 MG/2ML injection Commonly known as:  NARCAN     TAKE these medications   ipratropium-albuterol 0.5-2.5 (3) MG/3ML Soln Commonly known as:  DUONEB Take 3 mLs by nebulization every 6 (six) hours as needed for up to 30 days (shortness of breath).   levothyroxine 175 MCG tablet Commonly known as:  SYNTHROID, LEVOTHROID Take 1 tablet (175 mcg total) by mouth daily before breakfast for 30 days. Void previous prescription on file for levothyroxine            Durable Medical Equipment  (From admission, onward)         Start     Ordered   08/05/18 0000  DME Nebulizer machine    Question:  Patient needs a nebulizer to treat with the following condition  Answer:  COPD with acute bronchitis ([place])   08/05/18 0944          Allergies  Allergen Reactions  . Fentanyl Nausea And Vomiting  . Citalopram Other (See Comments)    Dizziness and shakiness.  . Morphine And Related Nausea And Vomiting    Consultations:     Procedures/Studies: Ct Abdomen Pelvis Wo Contrast  Result Date: 08/01/2018 CLINICAL DATA:  53 year old male with history of nausea, vomiting and diarrhea. Fever, chills and cough for the past 4 days. EXAM: CT ABDOMEN AND PELVIS WITHOUT CONTRAST TECHNIQUE: Multidetector CT imaging of the abdomen and pelvis was performed following the standard protocol without IV contrast. COMPARISON:  PET-CT 05/08/2013. FINDINGS: Lower chest: Small area of airspace consolidation in the medial aspect of the left lower lobe. Hepatobiliary: No definite cystic or solid hepatic lesions are confidently identified on today's noncontrast CT examination. Unenhanced appearance of the gallbladder is normal. Pancreas: No definite pancreatic mass or peripancreatic fluid or inflammatory changes are noted on today's noncontrast CT examination. Spleen: Unremarkable.  Adrenals/Urinary Tract: There are no abnormal calcifications within the collecting system of either kidney, along the course of either ureter, or within the lumen of the urinary bladder. No hydroureteronephrosis or perinephric stranding to suggest urinary tract obstruction at this time. The unenhanced appearance of the kidneys is unremarkable bilaterally. Unenhanced appearance of the urinary bladder is normal. Bilateral adrenal glands are normal in appearance. Stomach/Bowel: Unenhanced appearance of the stomach is normal. Multiple mildly dilated loops of small bowel with multiple air-fluid levels. There is also mild dilatation of portions of the colon with a relatively large volume of liquid stool, particularly in the right-side of the colon. Distal rectal stool and fluid also noted. Normal appendix. Vascular/Lymphatic: Aortic atherosclerosis. No lymphadenopathy noted in the abdomen or pelvis. Reproductive: Prostate gland and seminal vesicles are unremarkable in appearance. Other: No significant volume of ascites.  No pneumoperitoneum. Musculoskeletal: There are no aggressive appearing lytic or blastic lesions noted in the visualized portions of the skeleton. IMPRESSION: 1. Diffuse mild dilatation of small bowel and colon with multiple air-fluid levels throughout. Findings are suggestive of enterocolitis. 2. Small amount of airspace consolidation in the medial aspect of the left lower lobe. This is concerning for developing infection or sequela of recent aspiration. 3. Aortic atherosclerosis. Electronically Signed   By: Vinnie Langton M.D.   On: 08/01/2018 19:40   Dg Chest 2 View  Result Date: 08/01/2018 CLINICAL DATA:  Cough and fever.  History of laryngeal carcinoma EXAM: CHEST - 2 VIEW COMPARISON:  April 16, 2018 FINDINGS: Lungs are clear. The heart size and pulmonary vascularity are normal. No adenopathy. No bone lesions. Visualized trachea appears unremarkable. IMPRESSION: No edema or consolidation.   No neoplastic focus evident. Electronically Signed   By: Lowella Grip III M.D.   On: 08/01/2018 15:34       Subjective: Patient is feeling better, dyspnea continue to improve, no nausea or vomiting, no chest pain, able to ambulate.   Discharge Exam: Vitals:   08/05/18 0458 08/05/18 0904  BP: 117/76   Pulse: 64   Resp: 14   Temp: 98.2 F (36.8 C)   SpO2: 99% 100%   Vitals:   08/04/18 2034 08/04/18 2104 08/05/18 0458 08/05/18 0904  BP:  115/73 117/76   Pulse:  73 64   Resp:  16 14   Temp:  98 F (36.7 C) 98.2 F (36.8 C)   TempSrc:  Oral Oral   SpO2: 97% 98% 99% 100%  Weight:   58.5 kg   Height:        General: Not in pain or dyspnea  Neurology: Awake and alert, non focal  E ENT: no pallor, no icterus, oral mucosa moist Cardiovascular: No JVD. S1-S2 present, rhythmic, no gallops, rubs, or murmurs. No lower extremity edema. Pulmonary: positive breath sounds bilaterally, adequate air movement, no wheezing, scattered rhonchi but no rales. Gastrointestinal. Abdomen with, no organomegaly, non tender, no rebound or guarding Skin. No rashes Musculoskeletal: no joint deformities   The results of significant diagnostics from this hospitalization (including imaging, microbiology, ancillary and laboratory) are listed below for reference.     Microbiology: Recent Results (from the past 240 hour(s))  Blood Culture (routine x 2)     Status: None (Preliminary result)   Collection Time: 08/01/18  3:02 PM  Result Value Ref Range Status   Specimen Description   Final    BLOOD RIGHT ANTECUBITAL Performed at [facility] [address], [place], [place] [address]    Special Requests   Final    BOTTLES DRAWN AEROBIC AND ANAEROBIC Blood Culture adequate volume Performed at [facility] [address], [place], [place] [address]    Culture   Final    NO GROWTH 4 DAYS Performed at [facility], [place] [address], [place], [place]  [address]    Report Status PENDING  Incomplete  Blood Culture (routine x 2)     Status: None (Preliminary result)   Collection Time: 08/01/18  3:03 PM  Result Value Ref Range Status   Specimen Description   Final    BLOOD LEFT ANTECUBITAL Performed at [name] [address] [place], [place] [address]    Special Requests   Final    BOTTLES DRAWN AEROBIC AND ANAEROBIC Blood Culture adequate volume Performed at [facility] [address], [place], [place] [address]    Culture   Final    NO GROWTH 4 DAYS Performed at [facility], [place] [address], [place], [place] [address]    Report Status PENDING  Incomplete  MRSA PCR Screening     Status: None   Collection Time: 08/01/18  3:03 PM  Result Value Ref Range Status   MRSA by PCR NEGATIVE NEGATIVE Final    Comment:        The GeneXpert MRSA Assay (FDA approved for NASAL specimens only), is one component of a comprehensive MRSA colonization surveillance program. It is not intended to diagnose MRSA infection nor to  guide or monitor treatment for MRSA infections. Performed at [facility], [place] [address], [place], [place] [address]   Expectorated sputum assessment w rflx to resp cult     Status: None   Collection Time: 08/02/18  9:46 AM  Result Value Ref Range Status   Specimen Description SPUTUM  Final   Special Requests NONE  Final   Sputum evaluation   Final    THIS SPECIMEN IS ACCEPTABLE FOR SPUTUM CULTURE Performed at [facility], [place] [address], [place], [place] [address]    Report Status 08/02/2018 FINAL  Final  Culture, respiratory     Status: None   Collection Time: 08/02/18  9:46 AM  Result Value Ref Range Status   Specimen Description   Final    SPUTUM Performed at [facility] [address] [place], [place] [address]    Special Requests   Final    NONE Reflexed from [id number] Performed at [facility], [place] [address] [place], [place] [address]    Gram Stain   Final    ABUNDANT WBC PRESENT, PREDOMINANTLY PMN FEW GRAM POSITIVE RODS Performed at [facility], [place] [address] [place], [place] [address]    Culture FEW Consistent with normal respiratory flora.  Final   Report Status 08/04/2018 FINAL  Final     Labs: BNP (last 3 results) Recent Labs    04/16/18 1048  BNP 26.9   Basic Metabolic Panel: Recent Labs  Lab 08/01/18 1502 08/02/18 0420 08/03/18 0553 08/04/18 0508 08/05/18 0544  NA 136 133*  --  139 143  K 4.1 4.4  --  3.7 3.5  CL 98 110  --  117* 117*  CO2 19* 14*  --  16* 18*  GLUCOSE 86 88  --  106* 106*  BUN 67* 64*  --  40* 32*  CREATININE 4.88* 4.39* 3.45* 2.76* 2.06*  CALCIUM 8.0* 7.1*  --  8.0* 8.2*  MG  --  1.8  --   --   --    Liver Function Tests: Recent Labs  Lab 08/01/18 1502  AST 47*  ALT 23  ALKPHOS 56  BILITOT 0.5  PROT 7.9  ALBUMIN 4.4   Recent Labs  Lab 08/01/18 1500  LIPASE 77*   No results for input(s): AMMONIA in the last 168 hours. CBC: Recent Labs  Lab 08/01/18 1502 08/02/18 0420  WBC 10.0 8.4  NEUTROABS 9.0*  --   HGB 18.4* 13.6  HCT 55.9* 41.3  MCV 99.5 97.4  PLT 151 108*   Cardiac Enzymes: Recent Labs  Lab 08/01/18 1523  CKTOTAL 220   BNP: Invalid input(s): POCBNP CBG: No results for input(s): GLUCAP in the last 168 hours. D-Dimer No results for input(s): DDIMER in the last 72 hours. Hgb A1c No results for input(s): HGBA1C in the last 72 hours. Lipid Profile No results for input(s): CHOL, HDL, LDLCALC, TRIG, CHOLHDL, LDLDIRECT in the last 72 hours. Thyroid function studies No results for input(s): TSH, T4TOTAL, T3FREE, THYROIDAB in the last 72 hours.  Invalid input(s): FREET3 Anemia work up No results for input(s): VITAMINB12, FOLATE, FERRITIN, TIBC, IRON, RETICCTPCT in the last 72 hours. Urinalysis    Component Value Date/Time   COLORURINE YELLOW 08/01/2018 1913    APPEARANCEUR CLEAR 08/01/2018 1913   APPEARANCEUR Clear 04/16/2018 1057   LABSPEC 1.014 08/01/2018 1913   PHURINE 5.0 08/01/2018 1913   GLUCOSEU NEGATIVE 08/01/2018 1913   HGBUR MODERATE (A) 08/01/2018  [place] 08/01/2018 [place] Negative 04/16/2018 [place] 08/01/2018 1913   PROTEINUR 100 (A) 08/01/2018 1913   UROBILINOGEN 1.0 02/21/2015 1450   NITRITE NEGATIVE 08/01/2018 1913   LEUKOCYTESUR NEGATIVE 08/01/2018 1913   LEUKOCYTESUR Negative 04/16/2018 1057   Sepsis Labs Invalid input(s): PROCALCITONIN,  WBC,  LACTICIDVEN Microbiology Recent Results (from the past 240 hour(s))  Blood Culture (routine x 2)     Status: None (Preliminary result)   Collection Time: 08/01/18  3:02 PM  Result Value Ref Range Status   Specimen Description   Final    BLOOD RIGHT ANTECUBITAL Performed at [facility], [place] [address] [place], [place] [address]    Special Requests   Final    BOTTLES DRAWN AEROBIC AND ANAEROBIC Blood Culture adequate volume Performed at [facility] [address] [place], [place] [address]    Culture   Final    NO GROWTH 4 DAYS Performed at [facility], [place] [address] [place], [place] [address]    Report Status PENDING  Incomplete  Blood Culture (routine x 2)     Status: None (Preliminary result)   Collection Time: 08/01/18  3:03 PM  Result Value Ref Range Status   Specimen Description   Final    BLOOD LEFT ANTECUBITAL Performed at [facility] [address], [place], [place] [address]    Special Requests   Final    BOTTLES DRAWN AEROBIC AND ANAEROBIC Blood Culture adequate volume Performed at [facility] [address] [place], [place] [address]    Culture   Final    NO GROWTH 4 DAYS Performed at [facility], [place] [address], [place], [place] [address]    Report Status PENDING  Incomplete  MRSA PCR Screening      Status: None   Collection Time: 08/01/18  3:03 PM  Result Value Ref Range Status   MRSA by PCR NEGATIVE NEGATIVE Final    Comment:        The GeneXpert MRSA Assay (FDA approved for NASAL specimens only), is one component of a comprehensive MRSA colonization surveillance program. It is not intended to diagnose MRSA infection nor to guide or monitor treatment for MRSA infections. Performed at [facility], [place] [address], [place], [place] [address]   Expectorated sputum assessment w rflx to resp cult     Status: None   Collection Time: 08/02/18  9:46 AM  Result Value Ref Range Status   Specimen Description SPUTUM  Final   Special Requests NONE  Final   Sputum evaluation   Final    THIS SPECIMEN IS ACCEPTABLE FOR SPUTUM CULTURE Performed at [facility], [place] [address], [place], [place] [address]    Report Status 08/02/2018 FINAL  Final  Culture, respiratory     Status: None   Collection Time: 08/02/18  9:46 AM  Result Value Ref Range Status   Specimen Description   Final    SPUTUM Performed at [facility] [address], [place], [place] [address]    Special Requests   Final    NONE Reflexed from [id number] Performed at [facility] [place] [address] [place], [place] [address]    Gram Stain   Final    ABUNDANT WBC PRESENT, PREDOMINANTLY PMN FEW GRAM POSITIVE RODS Performed at [facility], [place] [address] [place], [place] [address]  Culture FEW Consistent with normal respiratory flora.  Final   Report Status 08/04/2018 FINAL  Final     Time coordinating discharge: 45 minutes  SIGNED:   Tawni Millers, MD  [facility] 08/05/2018, 9:22 AM Pager [phone]  If 7PM-7AM, please contact night-coverage [email or web address] Password [email or web address]

## 2018-08-06 ENCOUNTER — Emergency Department (HOSPITAL_COMMUNITY)
Admission: EM | Admit: 2018-08-06 | Discharge: 2018-08-06 | Disposition: A | Discharge status: Home / Self Care | Payer: Self-pay | Attending: Emergency Medicine | Admitting: Emergency Medicine

## 2018-08-06 ENCOUNTER — Emergency Department (HOSPITAL_COMMUNITY): Payer: Self-pay

## 2018-08-06 DIAGNOSIS — E039 Hypothyroidism, unspecified: Secondary | ICD-10-CM | POA: Insufficient documentation

## 2018-08-06 DIAGNOSIS — F17210 Nicotine dependence, cigarettes, uncomplicated: Secondary | ICD-10-CM | POA: Insufficient documentation

## 2018-08-06 DIAGNOSIS — J069 Acute upper respiratory infection, unspecified: Secondary | ICD-10-CM | POA: Insufficient documentation

## 2018-08-06 DIAGNOSIS — H6691 Otitis media, unspecified, right ear: Secondary | ICD-10-CM | POA: Insufficient documentation

## 2018-08-06 DIAGNOSIS — B9789 Other viral agents as the cause of diseases classified elsewhere: Secondary | ICD-10-CM

## 2018-08-06 LAB — CULTURE, BLOOD (ROUTINE X 2)
Culture: NO GROWTH
Culture: NO GROWTH
SPECIAL REQUESTS: ADEQUATE
Special Requests: ADEQUATE

## 2018-08-06 MED ORDER — AMOXICILLIN-POT CLAVULANATE 875-125 MG PO TABS: 1.0000 | ORAL_TABLET | Freq: Two times a day (BID) | ORAL | 0 refills | Status: DC

## 2018-08-06 MED ORDER — ALBUTEROL SULFATE HFA 108 (90 BASE) MCG/ACT IN AERS
2.0000 | INHALATION_SPRAY | Freq: Once | RESPIRATORY_TRACT | Status: AC
  Administered 2018-08-06: 2 via RESPIRATORY_TRACT
  Filled 2018-08-06: qty 6.7

## 2018-08-06 NOTE — ED Triage Notes (Signed)
Pt states he is homeless, reports severe congestion in his nose, head and chest. Congested cough present. VSS

## 2018-08-06 NOTE — Discharge Instructions (Addendum)
You were given a prescription for antibiotics. Please take the antibiotic prescription fully.   Please follow up with your primary care provider within 5-7 days for re-evaluation of your symptoms. If you do not have a primary care provider, information for a healthcare clinic has been provided for you to make arrangements for follow up care. Please return to the emergency department for any new or worsening symptoms.  

## 2018-08-06 NOTE — ED Provider Notes (Signed)
[facility] EMERGENCY DEPARTMENT Provider Note   CSN: [id number] Arrival date & time: 08/06/18  1042     History   Chief Complaint Chief Complaint  Patient presents with  . Nasal Congestion  . Cough    HPI [name] is a 53 y.o. male.  HPI   Pt is a 53 y/o male with a h/o heart murmur, vocal cord cancer who presents to the ED today c/o rhinorrhea, nasal congestion, sore throat, headache, sweats, chills, body aches, dry cough, and diarrhea. He reports subjective fevers and has felt more fatigued. States sxs started 1 week ago. Denies abd pain and urinary sxs. Has had a few episode of vomiting over the last several days. Last episode was yesterday.   Past Medical History:  Diagnosis Date  . Headache(784.0)   . Heart murmur    child  . Hx of radiation therapy 05/19/13- 06/27/13   glottic larynx, right true vocal cord 6300 cGy 28 sessions  . Hypothyroidism   . Shortness of breath   . 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 Severe sepsis ([place]) 08/02/2018  . Left lower lobe pneumonia ([place]) 08/02/2018  . AKI (acute kidney injury) ([place]) 08/02/2018  . Influenza B 08/01/2018  . Fatigue 04/16/2018  . Polysubstance dependence including opioid type drug, episodic abuse, with delirium ([place]) 03/22/2017  . Acute respiratory failure with hypoxemia ([place]) 03/19/2017  . Homelessness 10/25/2016  . Long term (current) use of opiate analgesic 10/11/2016  . Emphysema of lung ([place]) 12/18/2014  . Shortness of breath 11/12/2014  . Esophageal dysphagia 09/10/2014  . Tobacco use disorder 09/10/2014  . Routine health maintenance 07/09/2014  . Hypothyroidism due to acquired atrophy of thyroid 06/12/2014  . GERD (gastroesophageal reflux disease) 06/12/2014  . Hx of radiation therapy   . 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  05/06/2013  . Hoarseness of voice 03/17/2013    Past Surgical History:  Procedure Laterality Date  . BALLOON DILATION N/A 12/30/2013   Procedure: BALLOON DILATION;  Surgeon: Garlan Fair, MD;  Location: [facility];  Service: Endoscopy;  Laterality: N/A;  . DIRECT LARYNGOSCOPY Right 04/24/2013   Procedure: DIRECT LARYNGOSCOPY and BIOPSY OF TONGUE;  Surgeon: Jerrell Belfast, MD;  Location: [facility];  Service: ENT;  Laterality: Right;  . ESOPHAGEAL MANOMETRY N/A 09/07/2014   Procedure: ESOPHAGEAL MANOMETRY (EM);  Surgeon: Garlan Fair, MD;  [facility];  Service: Endoscopy;  Laterality: N/A;  . ESOPHAGOGASTRODUODENOSCOPY (EGD) WITH PROPOFOL N/A 12/30/2013   Procedure: ESOPHAGOGASTRODUODENOSCOPY (EGD) WITH PROPOFOL;  Surgeon: Garlan Fair, MD;  [facility];  Service: Endoscopy;  Laterality: N/A;  . MICROLARYNGOSCOPY Right 04/24/2013   Procedure: MICROLARYNGOSCOPY and RIGHT VOCAL CORD BIOPSY;  Surgeon: Jerrell Belfast, MD;  Location: [facility];  Service: ENT;  Laterality: Right;  . MULTIPLE EXTRACTIONS WITH ALVEOLOPLASTY N/A 09/30/2013   Procedure: Extraction of tooth #'s 2,3,4,5,6,7,8,9,10,11,12,15,22,23,24,25,26,27,28 with alveoloplasty;  Surgeon: Lenn Cal, DDS;  Location: [facility];  Service: Oral Surgery;  Laterality: N/A;  . TONSILLECTOMY     as a child  . wisdon teeth     age 40        Home Medications    Prior to Admission medications   Medication Sig Start Date End Date Taking? Authorizing Provider  amoxicillin-clavulanate (AUGMENTIN) 875-125 MG tablet Take 1 tablet by mouth every  12 (twelve) hours for 7 days. 08/06/18 08/13/18  Shakeita Vandevander S, PA-C  ipratropium-albuterol (DUONEB) 0.5-2.5 (3) MG/3ML SOLN Take 3 mLs by nebulization every 6 (six) hours as needed for up to 30 days (shortness of breath). 08/05/18 09/04/18  Arrien, Jimmy Picket, MD  levothyroxine (SYNTHROID, LEVOTHROID) 175 MCG tablet Take 1 tablet (175 mcg total) by mouth daily before  breakfast for 30 days. Void previous prescription on file for levothyroxine 08/05/18 09/04/18  Arrien, Jimmy Picket, MD    Family History Family History  Problem Relation Age of Onset  . COPD Mother   . Emphysema Mother   . Heart attack Father     Social History Social History   Tobacco Use  . Smoking status: Current Every Day Smoker    Packs/day: 1.00    Years: 34.00    Pack years: 34.00    Types: Cigarettes  . Smokeless tobacco: Never Used  . Tobacco comment: DOWN TO 4 - 5 CIGARETTES A DAY  Substance Use Topics  . Alcohol use: No    Alcohol/week: 0.0 standard drinks  . Drug use: Yes    Comment: Heroin     Allergies   Fentanyl; Citalopram; and Morphine and related   Review of Systems Review of Systems  Constitutional: Positive for chills, diaphoresis and fever.  HENT: Positive for congestion, ear pain, rhinorrhea and sore throat.   Respiratory: Positive for cough.   Cardiovascular: Negative for chest pain.  Gastrointestinal: Positive for diarrhea, nausea and vomiting. Negative for abdominal pain.  Genitourinary: Negative for dysuria, frequency and hematuria.  Musculoskeletal: Positive for myalgias.  Skin: Negative for rash.  Neurological: Positive for headaches. Negative for dizziness, weakness, light-headedness and numbness.     Physical Exam Updated Vital Signs BP (!) 118/96 (BP Location: Right Arm)   Pulse 75   Temp 99 F (37.2 C) (Oral)   Resp (!) 22   SpO2 100%   Physical Exam Vitals signs and nursing note reviewed.  Constitutional:      General: He is not in acute distress.    Appearance: He is well-developed. He is not toxic-appearing.  HENT:     Head: Normocephalic and atraumatic.     Left Ear: Tympanic membrane normal.     Ears:     Comments: Right TM erythematous and bulging. There is blood in the canal.    Nose: Nose normal.     Mouth/Throat:     Mouth: Mucous membranes are moist.     Pharynx: No oropharyngeal exudate or posterior  oropharyngeal erythema.  Eyes:     Conjunctiva/sclera: Conjunctivae normal.  Neck:     Musculoskeletal: Neck supple.  Cardiovascular:     Rate and Rhythm: Normal rate and regular rhythm.     Heart sounds: Normal heart sounds. No murmur.  Pulmonary:     Effort: Pulmonary effort is normal. No respiratory distress.     Breath sounds: Normal breath sounds. No stridor. No wheezing or rhonchi.  Abdominal:     General: Bowel sounds are normal.     Palpations: Abdomen is soft.     Tenderness: There is no abdominal tenderness.  Skin:    General: Skin is warm and dry.  Neurological:     Mental Status: He is alert.  Psychiatric:        Mood and Affect: Mood normal.      ED Treatments / Results  Labs (all labs ordered are listed, but only abnormal results are displayed) Labs Reviewed - No data to  display  EKG None  Radiology Dg Chest 2 View  Result Date: 08/06/2018 CLINICAL DATA:  Cough, fever, congestion for a week, some dizziness EXAM: CHEST - 2 VIEW COMPARISON:  Chest x-ray of 08/01/2018 FINDINGS: There is very minimal blunting of the left costophrenic angle which may indicate very tiny left pleural effusion. No definite pneumonia is seen. Minimal streakiness on the lateral view anteriorly in the region of the right middle lobe or lingula appears somewhat improved may be scarring. There is some peribronchial thickening which can be seen with bronchitis. Mediastinal and hilar contours are unremarkable. The heart is mildly enlarged and stable. No acute bony abnormality is seen. IMPRESSION: 1. Possible small left pleural effusion. 2. No definite pneumonia. Probable mild linear scarring in the right middle lobe or lingula on the lateral view. Electronically Signed   By: Ivar Drape M.D.   On: 08/06/2018 11:14    Procedures Procedures (including critical care time)  Medications Ordered in ED Medications  albuterol (PROVENTIL HFA;VENTOLIN HFA) 108 (90 Base) MCG/ACT inhaler 2 puff (has no  administration in time range)     Initial Impression / Assessment and Plan / ED Course  I have reviewed the triage vital signs and the nursing notes.  Pertinent labs & imaging results that were available during my care of the patient were reviewed by me and considered in my medical decision making (see chart for details).      Final Clinical Impressions(s) / ED Diagnoses   Final diagnoses:  Viral URI with cough  Right otitis media, unspecified otitis media type   Patient presenting with upper respiratory symptoms have been ongoing for a week.  Has a borderline temp here but otherwise vitals are stable.  He is nontoxic and nonseptic appearing.  Does not appear to be overly dehydrated.  Lungs are clear.  Abdomen is soft and nontender.  It appears that he has otitis media on the right.  X-ray shows a possible left pleural effusion but no definite pneumonia.  He does have scarring in the right middle lobe present that appears improved from prior.   Given his otitis media and upper respiratory symptoms with reported history of COPD, will cover him with Augmentin.  We will also give inhaler for symptoms.  Have advised him to take his prescription to the [facility] to help him fill this as he is homeless.  Discussed reasons to return to the ED.  He voices understanding of plan was return.  All questions answered.  ED Discharge Orders         Ordered    amoxicillin-clavulanate (AUGMENTIN) 875-125 MG tablet  Every 12 hours     08/06/18 1200           Hensley Aziz S, PA-C 08/06/18 1200    Valarie Merino, MD 08/06/18 [facility] [place]

## 2018-08-09 ENCOUNTER — Emergency Department (HOSPITAL_COMMUNITY)
Admission: EM | Admit: 2018-08-09 | Discharge: 2018-08-09 | Disposition: A | Discharge status: Home / Self Care | Payer: Self-pay | Attending: Emergency Medicine | Admitting: Emergency Medicine

## 2018-08-09 ENCOUNTER — Emergency Department (HOSPITAL_COMMUNITY)
Admission: EM | Admit: 2018-08-09 | Discharge: 2018-08-10 | Disposition: A | Discharge status: Other Acute Inpatient Hospital | Payer: Self-pay | Attending: Emergency Medicine | Admitting: Emergency Medicine

## 2018-08-09 ENCOUNTER — Encounter (HOSPITAL_COMMUNITY): Payer: Self-pay | Admitting: Emergency Medicine

## 2018-08-09 ENCOUNTER — Emergency Department (HOSPITAL_COMMUNITY): Payer: Self-pay

## 2018-08-09 ENCOUNTER — Other Ambulatory Visit: Payer: Self-pay

## 2018-08-09 DIAGNOSIS — F17210 Nicotine dependence, cigarettes, uncomplicated: Secondary | ICD-10-CM | POA: Insufficient documentation

## 2018-08-09 DIAGNOSIS — Z8521 Personal history of malignant neoplasm of larynx: Secondary | ICD-10-CM | POA: Insufficient documentation

## 2018-08-09 DIAGNOSIS — H6501 Acute serous otitis media, right ear: Secondary | ICD-10-CM | POA: Insufficient documentation

## 2018-08-09 DIAGNOSIS — R45851 Suicidal ideations: Secondary | ICD-10-CM | POA: Insufficient documentation

## 2018-08-09 DIAGNOSIS — F332 Major depressive disorder, recurrent severe without psychotic features: Secondary | ICD-10-CM | POA: Insufficient documentation

## 2018-08-09 DIAGNOSIS — F1414 Cocaine abuse with cocaine-induced mood disorder: Secondary | ICD-10-CM

## 2018-08-09 DIAGNOSIS — E034 Atrophy of thyroid (acquired): Secondary | ICD-10-CM

## 2018-08-09 DIAGNOSIS — E039 Hypothyroidism, unspecified: Secondary | ICD-10-CM | POA: Insufficient documentation

## 2018-08-09 DIAGNOSIS — J441 Chronic obstructive pulmonary disease with (acute) exacerbation: Secondary | ICD-10-CM | POA: Insufficient documentation

## 2018-08-09 HISTORY — DX: Other psychoactive substance abuse, uncomplicated: F19.10

## 2018-08-09 LAB — COMPREHENSIVE METABOLIC PANEL
ALK PHOS: 34 U/L — AB (ref 38–126)
ALT: 26 U/L (ref 0–44)
AST: 32 U/L (ref 15–41)
Albumin: 2.9 g/dL — ABNORMAL LOW (ref 3.5–5.0)
Anion gap: 8 (ref 5–15)
BILIRUBIN TOTAL: 0.2 mg/dL — AB (ref 0.3–1.2)
BUN: 25 mg/dL — ABNORMAL HIGH (ref 6–20)
CO2: 25 mmol/L (ref 22–32)
Calcium: 8 mg/dL — ABNORMAL LOW (ref 8.9–10.3)
Chloride: 109 mmol/L (ref 98–111)
Creatinine, Ser: 1.34 mg/dL — ABNORMAL HIGH (ref 0.61–1.24)
GFR calc Af Amer: >60 mL/min (ref >60)
GFR calc non Af Amer: 60 mL/min — ABNORMAL LOW (ref >60)
Glucose, Bld: 119 mg/dL — ABNORMAL HIGH (ref 70–99)
Potassium: 3 mmol/L — ABNORMAL LOW (ref 3.5–5.1)
Sodium: 142 mmol/L (ref 135–145)
TOTAL PROTEIN: 5.3 g/dL — AB (ref 6.5–8.1)

## 2018-08-09 LAB — CBC WITH DIFFERENTIAL/PLATELET
Abs Immature Granulocytes: 0.02 10*3/uL (ref 0.00–0.07)
Basophils Absolute: 0 10*3/uL (ref 0.0–0.1)
Basophils Relative: 0 %
EOS ABS: 0.1 10*3/uL (ref 0.0–0.5)
EOS PCT: 3 %
HCT: 35.9 % — ABNORMAL LOW (ref 39.0–52.0)
Hemoglobin: 11.9 g/dL — ABNORMAL LOW (ref 13.0–17.0)
Immature Granulocytes: 1 %
Lymphocytes Relative: 36 %
Lymphs Abs: 1.2 10*3/uL (ref 0.7–4.0)
MCH: 32.5 pg (ref 26.0–34.0)
MCHC: 33.1 g/dL (ref 30.0–36.0)
MCV: 98.1 fL (ref 80.0–100.0)
Monocytes Absolute: 0.2 10*3/uL (ref 0.1–1.0)
Monocytes Relative: 7 %
Neutro Abs: 1.7 10*3/uL (ref 1.7–7.7)
Neutrophils Relative %: 53 %
Platelets: 253 10*3/uL (ref 150–400)
RBC: 3.66 MIL/uL — ABNORMAL LOW (ref 4.22–5.81)
RDW: 13 % (ref 11.5–15.5)
WBC: 3.2 10*3/uL — ABNORMAL LOW (ref 4.0–10.5)
nRBC: 0 % (ref 0.0–0.2)

## 2018-08-09 LAB — RAPID URINE DRUG SCREEN, HOSP PERFORMED
Amphetamines: NOT DETECTED
Barbiturates: NOT DETECTED
Benzodiazepines: NOT DETECTED
Cocaine: POSITIVE — AB
Opiates: POSITIVE — AB
TETRAHYDROCANNABINOL: NOT DETECTED

## 2018-08-09 LAB — SALICYLATE LEVEL: Salicylate Lvl: <7 mg/dL (ref 2.8–30.0)

## 2018-08-09 LAB — INFLUENZA PANEL BY PCR (TYPE A & B)
Influenza A By PCR: NEGATIVE
Influenza B By PCR: POSITIVE — AB

## 2018-08-09 LAB — CBC
HCT: 36.2 % — ABNORMAL LOW (ref 39.0–52.0)
Hemoglobin: 11.9 g/dL — ABNORMAL LOW (ref 13.0–17.0)
MCH: 31.7 pg (ref 26.0–34.0)
MCHC: 32.9 g/dL (ref 30.0–36.0)
MCV: 96.5 fL (ref 80.0–100.0)
Platelets: 263 10*3/uL (ref 150–400)
RBC: 3.75 MIL/uL — ABNORMAL LOW (ref 4.22–5.81)
RDW: 12.9 % (ref 11.5–15.5)
WBC: 3.6 10*3/uL — ABNORMAL LOW (ref 4.0–10.5)
nRBC: 0 % (ref 0.0–0.2)

## 2018-08-09 LAB — ETHANOL: Alcohol, Ethyl (B): <10 mg/dL (ref <10)

## 2018-08-09 LAB — BASIC METABOLIC PANEL
Anion gap: 7 (ref 5–15)
BUN: 27 mg/dL — ABNORMAL HIGH (ref 6–20)
CO2: 25 mmol/L (ref 22–32)
Calcium: 8 mg/dL — ABNORMAL LOW (ref 8.9–10.3)
Chloride: 107 mmol/L (ref 98–111)
Creatinine, Ser: 1.49 mg/dL — ABNORMAL HIGH (ref 0.61–1.24)
GFR calc Af Amer: >60 mL/min (ref >60)
GFR calc non Af Amer: 52 mL/min — ABNORMAL LOW (ref >60)
GLUCOSE: 87 mg/dL (ref 70–99)
Potassium: 3.9 mmol/L (ref 3.5–5.1)
Sodium: 139 mmol/L (ref 135–145)

## 2018-08-09 LAB — ACETAMINOPHEN LEVEL: Acetaminophen (Tylenol), Serum: <10 ug/mL — ABNORMAL LOW (ref 10–30)

## 2018-08-09 MED ORDER — NAPROXEN 500 MG PO TABS
500.0000 mg | ORAL_TABLET | Freq: Two times a day (BID) | ORAL | Status: DC | PRN
  Administered 2018-08-09: 500 mg via ORAL
  Filled 2018-08-09: qty 1

## 2018-08-09 MED ORDER — LOPERAMIDE HCL 2 MG PO CAPS: 2.0000 mg | ORAL_CAPSULE | ORAL | Status: DC | PRN

## 2018-08-09 MED ORDER — LEVOTHYROXINE SODIUM 175 MCG PO TABS: 175.0000 ug | ORAL_TABLET | Freq: Every day | ORAL | 0 refills | Status: DC

## 2018-08-09 MED ORDER — SODIUM CHLORIDE 0.9 % IV BOLUS
500.0000 mL | Freq: Once | INTRAVENOUS | Status: AC
  Administered 2018-08-09: 500 mL via INTRAVENOUS

## 2018-08-09 MED ORDER — IPRATROPIUM-ALBUTEROL 0.5-2.5 (3) MG/3ML IN SOLN: 3.0000 mL | Freq: Four times a day (QID) | RESPIRATORY_TRACT | 0 refills | Status: DC | PRN

## 2018-08-09 MED ORDER — IPRATROPIUM BROMIDE 0.02 % IN SOLN
0.5000 mg | Freq: Once | RESPIRATORY_TRACT | Status: AC
  Administered 2018-08-09: 0.5 mg via RESPIRATORY_TRACT

## 2018-08-09 MED ORDER — AMOXICILLIN-POT CLAVULANATE 875-125 MG PO TABS
1.0000 | ORAL_TABLET | Freq: Two times a day (BID) | ORAL | Status: DC
  Administered 2018-08-09 – 2018-08-10 (×2): 1 via ORAL
  Filled 2018-08-09 (×2): qty 1

## 2018-08-09 MED ORDER — ONDANSETRON 4 MG PO TBDP
4.0000 mg | ORAL_TABLET | Freq: Four times a day (QID) | ORAL | Status: DC | PRN
  Administered 2018-08-10: 4 mg via ORAL
  Filled 2018-08-09: qty 1

## 2018-08-09 MED ORDER — IPRATROPIUM-ALBUTEROL 0.5-2.5 (3) MG/3ML IN SOLN
3.0000 mL | Freq: Once | RESPIRATORY_TRACT | Status: AC
  Administered 2018-08-09: 3 mL via RESPIRATORY_TRACT
  Filled 2018-08-09: qty 3

## 2018-08-09 MED ORDER — LEVOTHYROXINE SODIUM 25 MCG PO TABS
175.0000 ug | ORAL_TABLET | Freq: Every day | ORAL | Status: DC
  Administered 2018-08-10: 175 ug via ORAL
  Filled 2018-08-09: qty 1

## 2018-08-09 MED ORDER — CLONIDINE HCL 0.1 MG PO TABS
0.1000 mg | ORAL_TABLET | Freq: Once | ORAL | Status: AC
  Administered 2018-08-09: 0.1 mg via ORAL
  Filled 2018-08-09: qty 1

## 2018-08-09 MED ORDER — METHOCARBAMOL 500 MG PO TABS
500.0000 mg | ORAL_TABLET | Freq: Three times a day (TID) | ORAL | Status: DC | PRN
  Administered 2018-08-09 – 2018-08-10 (×2): 500 mg via ORAL
  Filled 2018-08-09 (×2): qty 1

## 2018-08-09 MED ORDER — DICYCLOMINE HCL 20 MG PO TABS
20.0000 mg | ORAL_TABLET | Freq: Four times a day (QID) | ORAL | Status: DC | PRN
  Administered 2018-08-09: 20 mg via ORAL
  Filled 2018-08-09: qty 1

## 2018-08-09 MED ORDER — ALBUTEROL (5 MG/ML) CONTINUOUS INHALATION SOLN
10.0000 mg/h | INHALATION_SOLUTION | Freq: Once | RESPIRATORY_TRACT | Status: AC
  Administered 2018-08-09: 10 mg/h via RESPIRATORY_TRACT
  Filled 2018-08-09: qty 20

## 2018-08-09 MED ORDER — HYDROXYZINE HCL 25 MG PO TABS
25.0000 mg | ORAL_TABLET | Freq: Four times a day (QID) | ORAL | Status: DC | PRN
  Administered 2018-08-09 – 2018-08-10 (×3): 25 mg via ORAL
  Filled 2018-08-09 (×3): qty 1

## 2018-08-09 MED FILL — IPRAT-ALBUT 0.5-3(2.5) MG/3: 0.5-2.5 (3) | 30 days supply | Qty: 360 | Fill #0 | Status: TO

## 2018-08-09 MED FILL — LEVOTHYROXINE 175 MCG TABLE: 175 | 30 days supply | Qty: 30 | Fill #0 | Status: TO

## 2018-08-09 MED FILL — AMOX-CLAV 875-125 MG TABLET: 875-125 | 7 days supply | Qty: 14 | Fill #0

## 2018-08-09 MED FILL — LEVOTHYROXINE 175 MCG TAB: 175 | 30 days supply | Qty: 30 | Fill #0

## 2018-08-09 MED FILL — IPRAT-ALBUT 0.5-3(2.5) MG/3: 0.5-2.5 (3) | 30 days supply | Qty: 360 | Fill #0

## 2018-08-09 NOTE — ED Notes (Signed)
98% while ambulating °

## 2018-08-09 NOTE — ED Notes (Signed)
Bed: [facility] Expected date:  Expected time:  Means of arrival:  Comments: 

## 2018-08-09 NOTE — Discharge Planning (Signed)
[facility] consulted regarding medication assistance.  Pt was recently discharged for medical unit and provided [facility] card with co-pay overridie 08/06/2018.  No further medication assistance is available at this time.  EDSW consulted to advise/remind pt of [facility] medical resources.

## 2018-08-09 NOTE — ED Notes (Signed)
Patient transported to X-ray 

## 2018-08-09 NOTE — Discharge Planning (Signed)
[facility] consulted regarding pt misplaced his [facility] letter.  [employer] wrote out [facility] letter and faxed to [facility].  No further EDCM needs identified.

## 2018-08-09 NOTE — ED Notes (Signed)
Social worker in room.

## 2018-08-09 NOTE — ED Notes (Signed)
Bed: MA26 Expected date:  Expected time:  Means of arrival:  Comments: EMS 53 yo male from home SOB/flu and pneumonia diagnosis

## 2018-08-09 NOTE — ED Notes (Signed)
Pt under IVC, presents with SI, plan to kill himself by overdose.  Pt abuses Heroin, Cocaine and Weed.  A&O x 3, no distress noted, resting at present.  Monitoring for safety, Q 15 min checks in effect.

## 2018-08-09 NOTE — ED Triage Notes (Signed)
Pt presents IVCed with [employer]. Patient IVCed by daughter. Per papers, "[name] is suffering from drug addiction. His drugs of choice are heroin, cocaine, and weed. Gillis Ends sent his daughter several text messages stating that that he wants to kill himself and he wished he had a gun. He also stated to his daughter he's going to continue to use drugs so that he can overdose and he doesn't want to use narcan is he does overdose. Daughter advised [name] has overdosed several times last year."

## 2018-08-09 NOTE — BH Assessment (Addendum)
Assessment Note  [name] is an 53 y.o. male who presents to the ED under IVC initiated by his daughter. According to the IVC, the respondent "is suffering from drug addiction. His drugs of choice are heroin, cocaine, and weed. Sent his daughter several text messages stating that he wants to kill himself and wished he had a gun. He also stated to his daughter that he's going to continue to use drugs so that he can overdose and he doesn't want to use the Narcan if he does overdose."  During the assessment the pt reports he feels suicidal because he is tired of living this life. Pt states he is homeless and sleeps in a camper. Pt states he does not work and has been trying to get disability since 2014. Pt states he is helpless and has no support or resources. Pt states he does not have any desire to live and tells this Probation officer he will continue using heroin until he overdoses. Pt states he has not eaten all day today because he does not have access to food. Pt reports he has not slept in about 3 days and has been binging on heroin, cocaine, and cannabis. Pt has several ED visits c/o accidental overdose, however pt tells this writer the OD has been intentional in attempts to end his life. Pt continues to endorse SI throughout the assessment.   Per Lindon Romp, NP pt is recommended for inpt treatment. TTS to seek placement once pt is medically cleared. EDP Milton Ferguson, MD and pt's nurse Horton Chin, RN have been advised.   Diagnosis: MDD, recurrent, severe, w/o psychosis; Opioid use disorder, severe; Cocaine use disorder, severe; Cannabis use disorder, severe  Past Medical History:  Past Medical History:  Diagnosis Date  . Headache(784.0)   . Heart murmur    child  . Hx of radiation therapy 05/19/13- 06/27/13   glottic larynx, right true vocal cord 6300 cGy 28 sessions  . Hypothyroidism   . Polysubstance abuse ([place])   . Shortness of breath   . Squamous cell carcinoma of vocal cord  ([place]) 09/11/2013  . Vocal cord cancer ([place]) 05/01/2013    Past Surgical History:  Procedure Laterality Date  . BALLOON DILATION N/A 12/30/2013   Procedure: BALLOON DILATION;  Surgeon: Garlan Fair, MD;  Location: [facility];  Service: Endoscopy;  Laterality: N/A;  . DIRECT LARYNGOSCOPY Right 04/24/2013   Procedure: DIRECT LARYNGOSCOPY and BIOPSY OF TONGUE;  Surgeon: Jerrell Belfast, MD;  Location: [facility];  Service: ENT;  Laterality: Right;  . ESOPHAGEAL MANOMETRY N/A 09/07/2014   Procedure: ESOPHAGEAL MANOMETRY (EM);  Surgeon: Garlan Fair, MD;  [facility];  Service: Endoscopy;  Laterality: N/A;  . ESOPHAGOGASTRODUODENOSCOPY (EGD) WITH PROPOFOL N/A 12/30/2013   Procedure: ESOPHAGOGASTRODUODENOSCOPY (EGD) WITH PROPOFOL;  Surgeon: Garlan Fair, MD;  [facility];  Service: Endoscopy;  Laterality: N/A;  . MICROLARYNGOSCOPY Right 04/24/2013   Procedure: MICROLARYNGOSCOPY and RIGHT VOCAL CORD BIOPSY;  Surgeon: Jerrell Belfast, MD;  Location: [facility];  Service: ENT;  Laterality: Right;  . MULTIPLE EXTRACTIONS WITH ALVEOLOPLASTY N/A 09/30/2013   Procedure: Extraction of tooth #'s 2,3,4,5,6,7,8,9,10,11,12,15,22,23,24,25,26,27,28 with alveoloplasty;  Surgeon: Lenn Cal, DDS;  [facility];  Service: Oral Surgery;  Laterality: N/A;  . TONSILLECTOMY     as a child  . wisdon teeth     age 77    Family History:  Family History  Problem Relation Age of Onset  . COPD Mother   . Emphysema  Mother   . Heart attack Father     Social History:  reports that he has been smoking cigarettes. He has a 34.00 pack-year smoking history. He has never used smokeless tobacco. He reports current drug use. Drugs: Marijuana and Cocaine. He reports that he does not drink alcohol.  Additional Social History:  Alcohol / Drug Use Pain Medications: See MAR Prescriptions: See MAR Over the Counter: See MAR History of alcohol / drug use?: Yes Longest period of sobriety (when/how  long): none reported Substance #1 Name of Substance 1: Cocaine 1 - Age of First Use: unknown 1 - Amount (size/oz): pt stated "as much as I can" 1 - Frequency: daily 1 - Duration: ongoing 1 - Last Use / Amount: 08/07/2018 Substance #2 Name of Substance 2: Heroin 2 - Age of First Use: 46 2 - Amount (size/oz): excessive 2 - Frequency: daily 2 - Duration: ongoing 2 - Last Use / Amount: 08/09/2018 Substance #3 Name of Substance 3: Cannabis 3 - Age of First Use: unknown 3 - Amount (size/oz): pt stated "as much as I can" 3 - Frequency: daily 3 - Duration: ongoing 3 - Last Use / Amount: 08/07/2018  CIWA: CIWA-Ar BP: (!) 123/91 Pulse Rate: 66 COWS:    Allergies:  Allergies  Allergen Reactions  . Fentanyl Nausea And Vomiting  . Citalopram Other (See Comments)    Dizziness and shakiness.  . Morphine And Related Nausea And Vomiting    Home Medications: (Not in a hospital admission)   OB/GYN Status:  No LMP for male patient.  General Assessment Data Location of Assessment: [facility] TTS Assessment: In system Is this a Tele or Face-to-Face Assessment?: Face-to-Face Is this an Initial Assessment or a Re-assessment for this encounter?: Initial Assessment Patient Accompanied by:: N/A Language Other than English: No Living Arrangements: Homeless/Shelter What gender do you identify as?: Male Marital status: Divorced Pregnancy Status: No Living Arrangements: Alone Can pt return to current living arrangement?: Yes Admission Status: Involuntary Petitioner: Family member Is patient capable of signing voluntary admission?: No Referral Source: Self/Family/Friend Insurance type: MCD     Crisis Care Plan Living Arrangements: Alone Name of Psychiatrist: pt denies  Name of Therapist: pt denies  Education Status Is patient currently in school?: No Is the patient employed, unemployed or receiving disability?: Unemployed  Risk to self with the past 6 months Suicidal Ideation:  Yes-Currently Present Has patient been a risk to self within the past 6 months prior to admission? : Yes Suicidal Intent: Yes-Currently Present Has patient had any suicidal intent within the past 6 months prior to admission? : Yes Is patient at risk for suicide?: Yes Suicidal Plan?: Yes-Currently Present Has patient had any suicidal plan within the past 6 months prior to admission? : Yes Specify Current Suicidal Plan: pt states he plans to OD on heroin  Access to Means: Yes Specify Access to Suicidal Means: pt has access to heroin  What has been your use of drugs/alcohol within the last 12 months?: heroin, cocaine, cannabis  Previous Attempts/Gestures: Yes How many times?: 6 Other Self Harm Risks: hx of suicide attempts, homeless, lack of resources, depression, drug addiction, ongoing SI with current plan and access  Triggers for Past Attempts: Other personal contacts Intentional Self Injurious Behavior: None Family Suicide History: No Recent stressful life event(s): Financial Problems, Job Loss, Recent negative physical changes, Other (Comment)(drug addiction ) Persecutory voices/beliefs?: No Depression: Yes Depression Symptoms: Insomnia, Feeling worthless/self pity, Loss of interest in usual pleasures, Guilt Substance abuse  history and/or treatment for substance abuse?: Yes Suicide prevention information given to non-admitted patients: Not applicable  Risk to Others within the past 6 months Homicidal Ideation: No Does patient have any lifetime risk of violence toward others beyond the six months prior to admission? : No Thoughts of Harm to Others: No Current Homicidal Intent: No Current Homicidal Plan: No Access to Homicidal Means: No History of harm to others?: No Assessment of Violence: None Noted Does patient have access to weapons?: No Criminal Charges Pending?: No Does patient have a court date: No Is patient on probation?: No  Psychosis Hallucinations: None  noted Delusions: None noted  Mental Status Report Appearance/Hygiene: In scrubs, Unremarkable Eye Contact: Good Motor Activity: Freedom of movement Speech: Logical/coherent Level of Consciousness: Alert Mood: Depressed Affect: Depressed, Flat Anxiety Level: None Thought Processes: Relevant, Coherent Judgement: Impaired Orientation: Place, Person, Time, Situation, Appropriate for developmental age Obsessive Compulsive Thoughts/Behaviors: None  Cognitive Functioning Concentration: Normal Memory: Recent Intact, Remote Intact Is patient IDD: No Insight: Poor Impulse Control: Poor Appetite: Good Have you had any weight changes? : No Change Sleep: Decreased Total Hours of Sleep: 1 Vegetative Symptoms: None  ADLScreening [facility] Assessment Services) Patient's cognitive ability adequate to safely complete daily activities?: Yes Patient able to express need for assistance with ADLs?: Yes Independently performs ADLs?: Yes (appropriate for developmental age)  Prior Inpatient Therapy Prior Inpatient Therapy: No  Prior Outpatient Therapy Prior Outpatient Therapy: No Does patient have an ACCT team?: No Does patient have Intensive In-House Services?  : No Does patient have [facility] services? : No Does patient have P4CC services?: No  ADL Screening (condition at time of admission) Patient's cognitive ability adequate to safely complete daily activities?: Yes Is the patient deaf or have difficulty hearing?: No Does the patient have difficulty seeing, even when wearing glasses/contacts?: No Does the patient have difficulty concentrating, remembering, or making decisions?: No Patient able to express need for assistance with ADLs?: Yes Does the patient have difficulty dressing or bathing?: No Independently performs ADLs?: Yes (appropriate for developmental age) Does the patient have difficulty walking or climbing stairs?: Yes Weakness of Legs: Both Weakness of Arms/Hands: None  Home  Assistive Devices/Equipment Home Assistive Devices/Equipment: Eyeglasses    Abuse/Neglect Assessment (Assessment to be complete while patient is alone) Abuse/Neglect Assessment Can Be Completed: Yes Physical Abuse: Denies Verbal Abuse: Denies Sexual Abuse: Denies Exploitation of patient/patient's resources: Denies Self-Neglect: Denies     Regulatory affairs officer (For Healthcare) Does Patient Have a Medical Advance Directive?: No Would patient like information on creating a medical advance directive?: No - Patient declined          Disposition: Per Lindon Romp, NP pt is recommended for inpt treatment. TTS to seek placement once pt is medically cleared. EDP Milton Ferguson, MD and pt's nurse Horton Chin, RN have been advised.   Disposition Initial Assessment Completed for this Encounter: Yes Disposition of Patient: Admit Type of inpatient treatment program: Adult(per Lindon Romp, NP) Patient refused recommended treatment: No  On Site Evaluation by:   Reviewed with Physician:    Lyanne Co 08/09/2018 10:14 PM

## 2018-08-09 NOTE — ED Triage Notes (Signed)
Arrived from home, patient was found walking outside. C/C SOB and coldness. Non-productive cough since his dx last week with the flu. Did not get meds filled that he was prescribed upon discharge because he cannot afford them.   Vitals  -99% RA, lungs clear  -BP 138/92  -P 80

## 2018-08-09 NOTE — ED Provider Notes (Signed)
[facility] DEPT Provider Note   CSN: [id number] Arrival date & time: 08/09/18  0602     History   Chief Complaint Chief Complaint  Patient presents with  . Shortness of Breath  . Coldness    HPI [name] is a 53 y.o. male with history of COPD, emphysema presenting today for continued flulike symptoms.  Patient seen on 08/06/2018 for similar symptoms that were present for 1 week at time of previous evaluation.  Patient was prescribed Augmentin at that time for a otitis media as well as reported history of COPD.  He was given an inhaler and discharged with outpatient resources.  Patient states that he has been unable to fill his medications due to financial issues.  Patient states that he lives in a camper without electricity and became very cold last night and began feeling more ill so he presented to the emergency department.  Patient states that he feels he is need of a breathing treatment due to his COPD and his nebulizer at home does not work without electricity.  On initial evaluation patient is well-appearing and in no acute distress.  He is endorsing rhinorrhea, congestion, wheezing and feeling cold.  HPI  Past Medical History:  Diagnosis Date  . Headache(784.0)   . Heart murmur    child  . Hx of radiation therapy 05/19/13- 06/27/13   glottic larynx, right true vocal cord 6300 cGy 28 sessions  . Hypothyroidism   . Shortness of breath   . 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 Severe sepsis ([place]) 08/02/2018  . Left lower lobe pneumonia ([place]) 08/02/2018  . AKI (acute kidney injury) ([place]) 08/02/2018  . Influenza B 08/01/2018  . Fatigue 04/16/2018  . Polysubstance dependence including opioid type drug, episodic abuse, with delirium ([place]) 03/22/2017  . Acute respiratory failure with hypoxemia ([place]) 03/19/2017  .  Homelessness 10/25/2016  . Long term (current) use of opiate analgesic 10/11/2016  . Emphysema of lung ([place]) 12/18/2014  . Shortness of breath 11/12/2014  . Esophageal dysphagia 09/10/2014  . Tobacco use disorder 09/10/2014  . Routine health maintenance 07/09/2014  . Hypothyroidism due to acquired atrophy of thyroid 06/12/2014  . GERD (gastroesophageal reflux disease) 06/12/2014  . Hx of radiation therapy   . 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 05/06/2013  . Hoarseness of voice 03/17/2013    Past Surgical History:  Procedure Laterality Date  . BALLOON DILATION N/A 12/30/2013   Procedure: BALLOON DILATION;  Surgeon: Garlan Fair, MD;  Location: [facility];  Service: Endoscopy;  Laterality: N/A;  . DIRECT LARYNGOSCOPY Right 04/24/2013   Procedure: DIRECT LARYNGOSCOPY and BIOPSY OF TONGUE;  Surgeon: Jerrell Belfast, MD;  Location: [facility];  Service: ENT;  Laterality: Right;  . ESOPHAGEAL MANOMETRY N/A 09/07/2014   Procedure: ESOPHAGEAL MANOMETRY (EM);  Surgeon: Garlan Fair, MD;  Location: [facility];  Service: Endoscopy;  Laterality: N/A;  . ESOPHAGOGASTRODUODENOSCOPY (EGD) WITH PROPOFOL N/A 12/30/2013   Procedure: ESOPHAGOGASTRODUODENOSCOPY (EGD) WITH PROPOFOL;  Surgeon: Garlan Fair, MD;  Location: [facility];  Service: Endoscopy;  Laterality: N/A;  . MICROLARYNGOSCOPY Right 04/24/2013   Procedure: MICROLARYNGOSCOPY and RIGHT VOCAL CORD BIOPSY;  Surgeon: Jerrell Belfast, MD;  Location: [facility];  Service: ENT;  Laterality: Right;  . MULTIPLE EXTRACTIONS WITH ALVEOLOPLASTY N/A 09/30/2013   Procedure: Extraction of tooth #'s  8,3,4,1,9,6,2,2,29,79,89,21,19,41,74,08,14,48,18 with alveoloplasty;  Surgeon: Lenn Cal, DDS;  Location: [facility];  Service: Oral Surgery;  Laterality: N/A;  . TONSILLECTOMY     as a child  . wisdon teeth     age 41        Home Medications    Prior to Admission medications   Medication Sig Start Date End Date  Taking? Authorizing Provider  amoxicillin-clavulanate (AUGMENTIN) 875-125 MG tablet Take 1 tablet by mouth every 12 (twelve) hours for 7 days. 08/06/18 08/13/18  Couture, Cortni S, PA-C  ipratropium-albuterol (DUONEB) 0.5-2.5 (3) MG/3ML SOLN Take 3 mLs by nebulization every 6 (six) hours as needed for up to 30 days (shortness of breath). 08/05/18 09/04/18  Arrien, Jimmy Picket, MD  levothyroxine (SYNTHROID, LEVOTHROID) 175 MCG tablet Take 1 tablet (175 mcg total) by mouth daily before breakfast for 30 days. Void previous prescription on file for levothyroxine 08/05/18 09/04/18  Arrien, Jimmy Picket, MD    Family History Family History  Problem Relation Age of Onset  . COPD Mother   . Emphysema Mother   . Heart attack Father     Social History Social History   Tobacco Use  . Smoking status: Current Every Day Smoker    Packs/day: 1.00    Years: 34.00    Pack years: 34.00    Types: Cigarettes  . Smokeless tobacco: Never Used  . Tobacco comment: DOWN TO 4 - 5 CIGARETTES A DAY  Substance Use Topics  . Alcohol use: No    Alcohol/week: 0.0 standard drinks  . Drug use: Yes    Comment: Heroin     Allergies   Fentanyl; Citalopram; and Morphine and related   Review of Systems Review of Systems  Constitutional: Positive for fatigue. Negative for chills and fever.  HENT: Negative.  Negative for rhinorrhea and sore throat.   Eyes: Negative.  Negative for visual disturbance.  Respiratory: Positive for cough and shortness of breath.   Cardiovascular: Negative.  Negative for chest pain and leg swelling.  Gastrointestinal: Negative.  Negative for abdominal pain, blood in stool, diarrhea, nausea and vomiting.  Genitourinary: Negative.  Negative for dysuria and hematuria.  Musculoskeletal: Negative.  Negative for arthralgias and myalgias.  Skin: Negative.  Negative for rash.  Neurological: Negative.  Negative for dizziness, weakness and headaches.   Physical Exam Updated Vital  Signs BP 120/86   Pulse 75   Temp 97.6 F (36.4 C) (Oral)   Resp 16   Ht 5\' 11"  (1.803 m)   Wt 58.5 kg   SpO2 98%   BMI 17.99 kg/m   Physical Exam Constitutional:      General: He is not in acute distress.    Appearance: He is well-developed. He is not ill-appearing or diaphoretic.  HENT:     Head: Normocephalic and atraumatic. No raccoon eyes, Battle's sign, abrasion or contusion.     Right Ear: External ear normal. Tympanic membrane is erythematous and bulging.     Left Ear: Hearing, tympanic membrane, ear canal and external ear normal.     Ears:     Comments: Dried blood in right ear canal    Nose: Nose normal.     Mouth/Throat:     Lips: Pink.     Mouth: Mucous membranes are moist.     Pharynx: Oropharynx is clear. Uvula midline.  Eyes:     General: Vision grossly intact. Gaze aligned appropriately.     Extraocular Movements: Extraocular movements intact.     Conjunctiva/sclera: Conjunctivae  normal.     Pupils: Pupils are equal, round, and reactive to light.  Neck:     Musculoskeletal: Full passive range of motion without pain, normal range of motion and neck supple.     Trachea: Trachea and phonation normal. No tracheal deviation.  Cardiovascular:     Rate and Rhythm: Normal rate and regular rhythm.     Pulses:          Radial pulses are 2+ on the right side and 2+ on the left side.       Dorsalis pedis pulses are 2+ on the right side and 2+ on the left side.       Posterior tibial pulses are 2+ on the right side and 2+ on the left side.     Heart sounds: Normal heart sounds.  Pulmonary:     Effort: Pulmonary effort is normal. No respiratory distress.     Breath sounds: Normal air entry. Wheezing present.  Chest:     Chest wall: No tenderness.  Abdominal:     Palpations: Abdomen is soft.     Tenderness: There is no abdominal tenderness. There is no guarding or rebound.  Musculoskeletal: Normal range of motion.     Right lower leg: Normal.     Left lower leg:  Normal.  Feet:     Right foot:     Protective Sensation: 3 sites tested. 3 sites sensed.     Left foot:     Protective Sensation: 3 sites tested. 3 sites sensed.  Skin:    General: Skin is warm and dry.  Neurological:     Mental Status: He is alert and oriented to person, place, and time.     Comments: Speech is clear and goal oriented, follows commands Major Cranial nerves without deficit, no facial droop Normal strength in upper and lower extremities bilaterally including dorsiflexion and plantar flexion, strong and equal grip strength Sensation normal to light touch Moves extremities without ataxia, coordination intact Normal gait  Psychiatric:        Mood and Affect: Mood normal.        Speech: Speech normal.        Behavior: Behavior normal.    ED Treatments / Results  Labs (all labs ordered are listed, but only abnormal results are displayed) Labs Reviewed  CBC WITH DIFFERENTIAL/PLATELET - Abnormal; Notable for the following components:      Result Value   WBC 3.2 (*)    RBC 3.66 (*)    Hemoglobin 11.9 (*)    HCT 35.9 (*)    All other components within normal limits  BASIC METABOLIC PANEL - Abnormal; Notable for the following components:   BUN 27 (*)    Creatinine, Ser 1.49 (*)    Calcium 8.0 (*)    GFR calc non Af Amer 52 (*)    All other components within normal limits  INFLUENZA PANEL BY PCR (TYPE A & B) - Abnormal; Notable for the following components:   Influenza B By PCR POSITIVE (*)    All other components within normal limits    EKG None  Radiology Dg Chest 2 View  Result Date: 08/09/2018 CLINICAL DATA:  Cough and congestion EXAM: CHEST - 2 VIEW COMPARISON:  08/06/2018 FINDINGS: Cardiac shadow is within normal limits. The lungs are well aerated bilaterally. Mild right basilar atelectasis and small effusion are now seen. No other focal infiltrate is noted. No bony abnormality is seen. IMPRESSION: Mild right basilar atelectasis and effusion  new from the  prior study. Electronically Signed   By: Inez Catalina M.D.   On: 08/09/2018 07:42    Procedures Procedures (including critical care time)  Medications Ordered in ED Medications  ipratropium-albuterol (DUONEB) 0.5-2.5 (3) MG/3ML nebulizer solution 3 mL (3 mLs Nebulization Given 08/09/18 0649)  sodium chloride 0.9 % bolus 500 mL (0 mLs Intravenous Stopped 08/09/18 0932)  albuterol (PROVENTIL,VENTOLIN) solution continuous neb (10 mg/hr Nebulization Given 08/09/18 0905)  ipratropium (ATROVENT) nebulizer solution 0.5 mg (0.5 mg Nebulization Given 08/09/18 0905)     Initial Impression / Assessment and Plan / ED Course  I have reviewed the triage vital signs and the nursing notes.  Pertinent labs & imaging results that were available during my care of the patient were reviewed by me and considered in my medical decision making (see chart for details).  Clinical Course as of Aug 09 1146  Fri Aug 09, 2018  1100 Discussed with social work, they state that patient's medications are free due to override at [facility].   [BM]    Clinical Course User Index [BM] Deliah Boston, PA-C   53 year old male presenting for flulike illness x1 week with increasing cough and wheezing.  Patient without chest pain.  Bilateral wheezing on examination.  Otitis media noted to right side with dried blood as previously noted.  No signs of head injury or history of head trauma.  No signs of mastoiditis.  CBC nonacute BMP with elevated creatinine, appears to be improving from earlier studies Influenza B positive -errantly ordered as it was positive a few days ago  Patient given nebulizer treatment today with resolution of wheezing and shortness of breath.  Patient ambulated on emergency department by nursing staff without tachycardia or hypoxia.  Patient states that he is now breathing at baseline and is without complaint.  Patient seen and evaluated by Dr. Ralene Bathe who agrees that patient may now be discharged with  previously prescribed Augmentin for right otitis media and outpatient follow-up. ---------------------------- ---------------------------- Patient states that does not wish to be discharged because he does not have any medications at home and cannot use nebulizer due to lack of electricity.  He states concerned that since his camper does not have electricity that he will again catch a cold and have trouble breathing and need to come back to the ER. ------------- CSW spoke to patient about his situation and medications during this conversation patient endorsed SI.  I then evaluated the patient to speak to him about these concerns, patient states that he would rather be dead than be out living on the streets.  Patient denies specific plan to hurt himself, he states that he is worried about his health being out on the street. ------------- Informed that Shanon Brow TTS has seen the patient and discussed situation with him.  Shanon Brow advises that patient is no longer threatening SI. Patient is set up with peers support and multiple resources.  Patient has his Augmentin that was given for otitis media and COPD for free at the [facility]. --------------- On re-evaluation patient denying SI/HI, states that he is agreeable with care plan and outpatient resources given to him by CSW today.  At discharge he is afebrile, not tachycardic, not hypotensive, not hypoxic he is well-appearing and without acute distress.  He is ambulating quickly without difficulty or assistance.  At this time there does not appear to be any evidence of an acute emergency medical condition and the patient appears stable for discharge with appropriate outpatient follow up.  Diagnosis was discussed with patient who verbalizes understanding of care plan and is agreeable to discharge. I have discussed return precautions with patient who verbalize understanding of return precautions. Patient strongly encouraged to follow-up with their PCP this week. All  questions answered.  Patient's case rediscussed with Dr. Ralene Bathe who agrees with plan to discharge with follow-up.  ------------- 1:30 PM: Patient has returned to the ER requesting physical copy of his home prescriptions of levothyroxine and DuoNeb treatment.  He is requesting these at [facility].  Reviewed patient's chart which shows that he was restarted on his levothyroxine 175 mg by his primary care provider on 04/16/2018 for hypothyroidism, feel this is reasonable to refill at this time.  Patient also given DuoNeb prescription for his COPD treatment. Discussed with Dr. Ralene Bathe who agrees.  Note: Portions of this report may have been transcribed using voice recognition software. Every effort was made to ensure accuracy; however, inadvertent computerized transcription errors may still be present.  Final Clinical Impressions(s) / ED Diagnoses   Final diagnoses:  COPD exacerbation ([place])  Right acute serous otitis media, recurrence not specified    ED Discharge Orders    None       Gari Crown 08/09/18 1603    Quintella Reichert, MD 08/10/18 [phone]

## 2018-08-09 NOTE — ED Notes (Signed)
Requested patient to urinate and change his clothes.

## 2018-08-09 NOTE — Progress Notes (Signed)
CSW aware of consult. CSW spoke with patient's RN who stated patient was needing assistance with medication and was homeless. CSW spoke with patient at bedside who reported he had the flu, pneumonia and COPD. Patient reports he is homeless and staying in his friends camper. Per patient, if he "was not placed across the street he would kill himself". CSW relayed information to EDPA who placed TTS consult. CSW aware patient has been psychiatrically cleared. CSW consulted with RNCM regarding medication assistance. Per RNCM, patient has already been matched and just needed to go to [facility] for prescriptions. CSW attempted to go over homeless shelter resources with patient but patient denied needing this. CSW provided patient with a bus pass to assist with transportation.  1:45pm- Patient returned to waiting room stating he was unable to fill his prescriptions. CSW spoke with RNCM who was able to print patient a new match card to take to [facility] outpatient pharmacy. CSW provided patient with prescriptions and match card. Patient denied needing any further CSW needs. Please reconsult if needs arise.  Ollen Barges, [place] Work Department  [employer]  [phone]

## 2018-08-09 NOTE — Discharge Instructions (Addendum)
You have been diagnosed today with COPD exacerbation and right ear infection.   At this time there does not appear to be the presence of an emergent medical condition, however there is always the potential for conditions to change. Please read and follow the below instructions.  Please return to the Emergency Department immediately for any new or worsening symptoms. Please be sure to follow up with your Primary Care Provider this week regarding your visit today; please call their office to schedule an appointment even if you are feeling better for a follow-up visit. As previously discussed your medications are free to pick up.  Please take these as prescribed to help with your symptoms.  Get help right away if: You have pain that is not helped with medicine. You have swelling, redness, or pain around your ear. You get a stiff neck. You cannot move part of your face (paralyzed). You notice that the bone behind your ear hurts when you touch it. You get a very bad headache. Get help right away if: You are short of breath and it gets worse. You have trouble talking. You have chest pain. You cough up blood. You have a fever. You keep throwing up (vomiting). You feel weak or you pass out (faint). You feel confused. You are not able to sleep because of your symptoms. You are not able to do daily activities.  Please read the additional information packets attached to your discharge summary.  Do not take your medicine if  develop an itchy rash, swelling in your mouth or lips, or difficulty breathing.

## 2018-08-09 NOTE — ED Provider Notes (Signed)
[facility] DEPT Provider Note   CSN: [id number] Arrival date & time: 08/09/18  2006     History   Chief Complaint Chief Complaint  Patient presents with  . IVC    HPI [name] is a 53 y.o. male.  Patient has a history of substance abuse.  He has been telling his daughter that he wants to kill himself.  Patient states that he has attempted to take an overdose a few times before but has not done it today or recently  The history is provided by the patient. No language interpreter was used.  Altered Mental Status  Presenting symptoms: behavior changes   Severity:  Severe Most recent episode:  Today Episode history:  Multiple Timing:  Constant Progression:  Worsening Chronicity:  Recurrent Context: alcohol use   Associated symptoms: no abdominal pain, no hallucinations, no headaches, no rash and no seizures     Past Medical History:  Diagnosis Date  . Headache(784.0)   . Heart murmur    child  . Hx of radiation therapy 05/19/13- 06/27/13   glottic larynx, right true vocal cord 6300 cGy 28 sessions  . Hypothyroidism   . Polysubstance abuse ([place])   . Shortness of breath   . 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 Severe sepsis ([place]) 08/02/2018  . Left lower lobe pneumonia ([place]) 08/02/2018  . AKI (acute kidney injury) ([place]) 08/02/2018  . Influenza B 08/01/2018  . Fatigue 04/16/2018  . Polysubstance dependence including opioid type drug, episodic abuse, with delirium ([place]) 03/22/2017  . Acute respiratory failure with hypoxemia ([place]) 03/19/2017  . Homelessness 10/25/2016  . Long term (current) use of opiate analgesic 10/11/2016  . Emphysema of lung ([place]) 12/18/2014  . Shortness of breath 11/12/2014  . Esophageal dysphagia 09/10/2014  . Tobacco use disorder 09/10/2014  . Routine health maintenance 07/09/2014  .  Hypothyroidism due to acquired atrophy of thyroid 06/12/2014  . GERD (gastroesophageal reflux disease) 06/12/2014  . Hx of radiation therapy   . 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 05/06/2013  . Hoarseness of voice 03/17/2013    Past Surgical History:  Procedure Laterality Date  . BALLOON DILATION N/A 12/30/2013   Procedure: BALLOON DILATION;  Surgeon: Garlan Fair, MD;  Location: [facility];  Service: Endoscopy;  Laterality: N/A;  . DIRECT LARYNGOSCOPY Right 04/24/2013   Procedure: DIRECT LARYNGOSCOPY and BIOPSY OF TONGUE;  Surgeon: Jerrell Belfast, MD;  Location: [facility];  Service: ENT;  Laterality: Right;  . ESOPHAGEAL MANOMETRY N/A 09/07/2014   Procedure: ESOPHAGEAL MANOMETRY (EM);  Surgeon: Garlan Fair, MD;  Location: [facility];  Service: Endoscopy;  Laterality: N/A;  . ESOPHAGOGASTRODUODENOSCOPY (EGD) WITH PROPOFOL N/A 12/30/2013   Procedure: ESOPHAGOGASTRODUODENOSCOPY (EGD) WITH PROPOFOL;  Surgeon: Garlan Fair, MD;  Location: [facility];  Service: Endoscopy;  Laterality: N/A;  . MICROLARYNGOSCOPY Right 04/24/2013   Procedure: MICROLARYNGOSCOPY and RIGHT VOCAL CORD BIOPSY;  Surgeon: Jerrell Belfast, MD;  Location: [facility];  Service: ENT;  Laterality: Right;  . MULTIPLE EXTRACTIONS WITH ALVEOLOPLASTY N/A 09/30/2013   Procedure: Extraction of tooth #'s 2,3,4,5,6,7,8,9,10,11,12,15,22,23,24,25,26,27,28 with alveoloplasty;  Surgeon: Lenn Cal, DDS;  Location: [facility];  Service: Oral Surgery;  Laterality: N/A;  . TONSILLECTOMY     as a child  . wisdon teeth     age 59  Home Medications    Prior to Admission medications   Medication Sig Start Date End Date Taking? Authorizing Provider  amoxicillin-clavulanate (AUGMENTIN) 875-125 MG tablet Take 1 tablet by mouth every 12 (twelve) hours for 7 days. 08/06/18 08/13/18  Couture, Cortni S, PA-C  ipratropium-albuterol (DUONEB) 0.5-2.5 (3) MG/3ML SOLN Take 3 mLs by nebulization every 6  (six) hours as needed for up to 30 days (shortness of breath; if you feel this medicaiton is not working please return to the emergency department). 08/09/18 09/08/18  Nuala Alpha A, PA-C  levothyroxine (SYNTHROID, LEVOTHROID) 175 MCG tablet Take 1 tablet (175 mcg total) by mouth daily before breakfast for 30 days. Void previous prescription on file for levothyroxine 08/09/18 09/08/18  Deliah Boston PA-C    Family History Family History  Problem Relation Age of Onset  . COPD Mother   . Emphysema Mother   . Heart attack Father     Social History Social History   Tobacco Use  . Smoking status: Current Every Day Smoker    Packs/day: 1.00    Years: 34.00    Pack years: 34.00    Types: Cigarettes  . Smokeless tobacco: Never Used  . Tobacco comment: DOWN TO 4 - 5 CIGARETTES A DAY  Substance Use Topics  . Alcohol use: No    Alcohol/week: 0.0 standard drinks  . Drug use: Yes    Types: Marijuana, Cocaine    Comment: Heroin     Allergies   Fentanyl; Citalopram; and Morphine and related   Review of Systems Review of Systems  Constitutional: Negative for appetite change and fatigue.  HENT: Negative for congestion, ear discharge and sinus pressure.   Eyes: Negative for discharge.  Respiratory: Negative for cough.   Cardiovascular: Negative for chest pain.  Gastrointestinal: Negative for abdominal pain and diarrhea.  Genitourinary: Negative for frequency and hematuria.  Musculoskeletal: Negative for back pain.  Skin: Negative for rash.  Neurological: Negative for seizures and headaches.  Psychiatric/Behavioral: Positive for behavioral problems and suicidal ideas. Negative for hallucinations.     Physical Exam Updated Vital Signs BP (!) 123/91 (BP Location: Left Arm)   Pulse 66   Temp 98.2 F (36.8 C) (Oral)   Resp 18   Ht 5\' 11"  (1.803 m)   Wt 59 kg   SpO2 100%   BMI 18.13 kg/m   Physical Exam Vitals signs and nursing note reviewed.  Constitutional:       Appearance: He is well-developed.  HENT:     Head: Normocephalic.     Nose: Nose normal.  Eyes:     General: No scleral icterus.    Conjunctiva/sclera: Conjunctivae normal.  Neck:     Musculoskeletal: Neck supple.     Thyroid: No thyromegaly.  Cardiovascular:     Rate and Rhythm: Normal rate and regular rhythm.     Heart sounds: No murmur. No friction rub. No gallop.   Pulmonary:     Breath sounds: No stridor. No wheezing or rales.  Chest:     Chest wall: No tenderness.  Abdominal:     General: There is no distension.     Tenderness: There is no abdominal tenderness. There is no rebound.  Musculoskeletal: Normal range of motion.  Lymphadenopathy:     Cervical: No cervical adenopathy.  Skin:    Findings: No erythema or rash.  Neurological:     Mental Status: He is oriented to person, place, and time.     Motor: No abnormal muscle tone.  Coordination: Coordination normal.  Psychiatric:     Comments: Suicidal      ED Treatments / Results  Labs (all labs ordered are listed, but only abnormal results are displayed) Labs Reviewed  CBC - Abnormal; Notable for the following components:      Result Value   WBC 3.6 (*)    RBC 3.75 (*)    Hemoglobin 11.9 (*)    HCT 36.2 (*)    All other components within normal limits  COMPREHENSIVE METABOLIC PANEL  ETHANOL  SALICYLATE LEVEL  ACETAMINOPHEN LEVEL  RAPID URINE DRUG SCREEN, HOSP PERFORMED    EKG None  Radiology Dg Chest 2 View  Result Date: 08/09/2018 CLINICAL DATA:  Cough and congestion EXAM: CHEST - 2 VIEW COMPARISON:  08/06/2018 FINDINGS: Cardiac shadow is within normal limits. The lungs are well aerated bilaterally. Mild right basilar atelectasis and small effusion are now seen. No other focal infiltrate is noted. No bony abnormality is seen. IMPRESSION: Mild right basilar atelectasis and effusion new from the prior study. Electronically Signed   By: Inez Catalina M.D.   On: 08/09/2018 07:42     Procedures Procedures (including critical care time)  Medications Ordered in ED Medications - No data to display   Initial Impression / Assessment and Plan / ED Course  I have reviewed the triage vital signs and the nursing notes.  Pertinent labs & imaging results that were available during my care of the patient were reviewed by me and considered in my medical decision making (see chart for details).       Final Clinical Impressions(s) / ED Diagnoses   Final diagnoses:  None    ED Discharge Orders    None       Milton Ferguson, MD 08/10/18 2245

## 2018-08-09 NOTE — Discharge Planning (Signed)
[facility] corrected [facility] letter override to include new Rx.

## 2018-08-09 NOTE — Progress Notes (Signed)
Per Lindon Romp, NP pt is recommended for inpt treatment. TTS to seek placement once pt is medically cleared. EDP Milton Ferguson, MD and pt's nurse Horton Chin, RN have been advised.   Lind Covert, MSW, LCSW Therapeutic Triage Specialist  [phone]

## 2018-08-09 NOTE — ED Notes (Addendum)
Pt left without letting nurse D/C him, without paperwork, without signing, and without being able to retake vital signs.  Pt informed by staff he needed to wait for a few minutes so this RN could come to D/C him

## 2018-08-10 ENCOUNTER — Encounter (HOSPITAL_COMMUNITY): Payer: Self-pay

## 2018-08-10 ENCOUNTER — Inpatient Hospital Stay (HOSPITAL_COMMUNITY)
Admission: AD | Admit: 2018-08-10 | Discharge: 2018-08-20 | DRG: 896 | Disposition: A | Discharge status: Home / Self Care | Payer: Federal, State, Local not specified - Other | Attending: Psychiatry | Admitting: Psychiatry

## 2018-08-10 DIAGNOSIS — F17210 Nicotine dependence, cigarettes, uncomplicated: Secondary | ICD-10-CM | POA: Diagnosis present

## 2018-08-10 DIAGNOSIS — J189 Pneumonia, unspecified organism: Secondary | ICD-10-CM | POA: Diagnosis present

## 2018-08-10 DIAGNOSIS — F1414 Cocaine abuse with cocaine-induced mood disorder: Secondary | ICD-10-CM | POA: Diagnosis not present

## 2018-08-10 DIAGNOSIS — Z8521 Personal history of malignant neoplasm of larynx: Secondary | ICD-10-CM

## 2018-08-10 DIAGNOSIS — Z590 Homelessness: Secondary | ICD-10-CM

## 2018-08-10 DIAGNOSIS — F1210 Cannabis abuse, uncomplicated: Secondary | ICD-10-CM | POA: Diagnosis present

## 2018-08-10 DIAGNOSIS — Z9089 Acquired absence of other organs: Secondary | ICD-10-CM | POA: Diagnosis not present

## 2018-08-10 DIAGNOSIS — E039 Hypothyroidism, unspecified: Secondary | ICD-10-CM | POA: Diagnosis present

## 2018-08-10 DIAGNOSIS — Z825 Family history of asthma and other chronic lower respiratory diseases: Secondary | ICD-10-CM

## 2018-08-10 DIAGNOSIS — G4700 Insomnia, unspecified: Secondary | ICD-10-CM | POA: Diagnosis present

## 2018-08-10 DIAGNOSIS — Z85818 Personal history of malignant neoplasm of other sites of lip, oral cavity, and pharynx: Secondary | ICD-10-CM | POA: Diagnosis not present

## 2018-08-10 DIAGNOSIS — F419 Anxiety disorder, unspecified: Secondary | ICD-10-CM | POA: Diagnosis present

## 2018-08-10 DIAGNOSIS — Z915 Personal history of self-harm: Secondary | ICD-10-CM

## 2018-08-10 DIAGNOSIS — Z923 Personal history of irradiation: Secondary | ICD-10-CM | POA: Diagnosis not present

## 2018-08-10 DIAGNOSIS — Z8249 Family history of ischemic heart disease and other diseases of the circulatory system: Secondary | ICD-10-CM

## 2018-08-10 DIAGNOSIS — D649 Anemia, unspecified: Secondary | ICD-10-CM | POA: Diagnosis present

## 2018-08-10 DIAGNOSIS — R21 Rash and other nonspecific skin eruption: Secondary | ICD-10-CM | POA: Diagnosis not present

## 2018-08-10 DIAGNOSIS — H6691 Otitis media, unspecified, right ear: Secondary | ICD-10-CM | POA: Diagnosis present

## 2018-08-10 DIAGNOSIS — J440 Chronic obstructive pulmonary disease with acute lower respiratory infection: Secondary | ICD-10-CM | POA: Diagnosis present

## 2018-08-10 DIAGNOSIS — R45851 Suicidal ideations: Secondary | ICD-10-CM

## 2018-08-10 DIAGNOSIS — Z885 Allergy status to narcotic agent status: Secondary | ICD-10-CM

## 2018-08-10 DIAGNOSIS — R195 Other fecal abnormalities: Secondary | ICD-10-CM | POA: Diagnosis present

## 2018-08-10 DIAGNOSIS — F332 Major depressive disorder, recurrent severe without psychotic features: Secondary | ICD-10-CM | POA: Diagnosis present

## 2018-08-10 DIAGNOSIS — Z638 Other specified problems related to primary support group: Secondary | ICD-10-CM | POA: Diagnosis not present

## 2018-08-10 DIAGNOSIS — K047 Periapical abscess without sinus: Secondary | ICD-10-CM | POA: Diagnosis present

## 2018-08-10 DIAGNOSIS — F1120 Opioid dependence, uncomplicated: Secondary | ICD-10-CM | POA: Diagnosis present

## 2018-08-10 DIAGNOSIS — Z888 Allergy status to other drugs, medicaments and biological substances status: Secondary | ICD-10-CM

## 2018-08-10 DIAGNOSIS — Z79890 Hormone replacement therapy (postmenopausal): Secondary | ICD-10-CM

## 2018-08-10 DIAGNOSIS — Z79899 Other long term (current) drug therapy: Secondary | ICD-10-CM

## 2018-08-10 LAB — URINALYSIS, ROUTINE W REFLEX MICROSCOPIC
BILIRUBIN URINE: NEGATIVE
Glucose, UA: NEGATIVE mg/dL
HGB URINE DIPSTICK: NEGATIVE
Ketones, ur: NEGATIVE mg/dL
Leukocytes, UA: NEGATIVE
Nitrite: NEGATIVE
Protein, ur: NEGATIVE mg/dL
Specific Gravity, Urine: 1.018 (ref 1.005–1.030)
pH: 6 (ref 5.0–8.0)

## 2018-08-10 MED ORDER — ALBUTEROL SULFATE HFA 108 (90 BASE) MCG/ACT IN AERS
2.0000 | INHALATION_SPRAY | RESPIRATORY_TRACT | Status: DC | PRN
  Administered 2018-08-10 – 2018-08-20 (×12): 2 via RESPIRATORY_TRACT
  Filled 2018-08-10 (×2): qty 6.7

## 2018-08-10 MED ORDER — NAPROXEN 500 MG PO TABS
500.0000 mg | ORAL_TABLET | Freq: Two times a day (BID) | ORAL | Status: DC | PRN
  Administered 2018-08-11: 500 mg via ORAL
  Filled 2018-08-10: qty 1

## 2018-08-10 MED ORDER — ALUM & MAG HYDROXIDE-SIMETH 200-200-20 MG/5ML PO SUSP: 30.0000 mL | ORAL | Status: DC | PRN

## 2018-08-10 MED ORDER — IPRATROPIUM-ALBUTEROL 0.5-2.5 (3) MG/3ML IN SOLN
3.0000 mL | Freq: Four times a day (QID) | RESPIRATORY_TRACT | Status: DC | PRN
  Administered 2018-08-11 – 2018-08-18 (×4): 3 mL via RESPIRATORY_TRACT
  Filled 2018-08-10 (×6): qty 3

## 2018-08-10 MED ORDER — ONDANSETRON 4 MG PO TBDP
4.0000 mg | ORAL_TABLET | Freq: Four times a day (QID) | ORAL | Status: AC | PRN
  Administered 2018-08-11: 4 mg via ORAL
  Filled 2018-08-10 (×2): qty 1

## 2018-08-10 MED ORDER — CLONIDINE HCL 0.1 MG PO TABS
0.1000 mg | ORAL_TABLET | Freq: Every day | ORAL | Status: AC
  Administered 2018-08-16 – 2018-08-17 (×2): 0.1 mg via ORAL
  Filled 2018-08-10 (×2): qty 1

## 2018-08-10 MED ORDER — DULOXETINE HCL 30 MG PO CPEP
30.0000 mg | ORAL_CAPSULE | Freq: Every day | ORAL | Status: DC
  Administered 2018-08-11 – 2018-08-14 (×4): 30 mg via ORAL
  Filled 2018-08-10 (×6): qty 1

## 2018-08-10 MED ORDER — LEVOTHYROXINE SODIUM 175 MCG PO TABS
175.0000 ug | ORAL_TABLET | Freq: Every day | ORAL | Status: DC
  Administered 2018-08-11 – 2018-08-20 (×10): 175 ug via ORAL
  Filled 2018-08-10 (×12): qty 1

## 2018-08-10 MED ORDER — GABAPENTIN 300 MG PO CAPS
300.0000 mg | ORAL_CAPSULE | Freq: Two times a day (BID) | ORAL | Status: DC
  Administered 2018-08-10 – 2018-08-15 (×11): 300 mg via ORAL
  Filled 2018-08-10 (×16): qty 1

## 2018-08-10 MED ORDER — MAGNESIUM HYDROXIDE 400 MG/5ML PO SUSP: 30.0000 mL | Freq: Every day | ORAL | Status: DC | PRN

## 2018-08-10 MED ORDER — CLONIDINE HCL 0.1 MG PO TABS
0.1000 mg | ORAL_TABLET | Freq: Four times a day (QID) | ORAL | Status: AC
  Administered 2018-08-10 – 2018-08-12 (×9): 0.1 mg via ORAL
  Filled 2018-08-10 (×11): qty 1

## 2018-08-10 MED ORDER — DICYCLOMINE HCL 20 MG PO TABS: 20.0000 mg | ORAL_TABLET | Freq: Four times a day (QID) | ORAL | Status: AC | PRN

## 2018-08-10 MED ORDER — HYDROXYZINE HCL 25 MG PO TABS: 25.0000 mg | ORAL_TABLET | Freq: Three times a day (TID) | ORAL | Status: DC | PRN

## 2018-08-10 MED ORDER — CLONIDINE HCL 0.1 MG PO TABS
0.1000 mg | ORAL_TABLET | ORAL | Status: AC
  Administered 2018-08-13 – 2018-08-15 (×4): 0.1 mg via ORAL
  Filled 2018-08-10 (×4): qty 1

## 2018-08-10 MED ORDER — GABAPENTIN 300 MG PO CAPS
300.0000 mg | ORAL_CAPSULE | Freq: Two times a day (BID) | ORAL | Status: DC
  Administered 2018-08-10: 300 mg via ORAL
  Filled 2018-08-10: qty 1

## 2018-08-10 MED ORDER — METHOCARBAMOL 500 MG PO TABS
500.0000 mg | ORAL_TABLET | Freq: Three times a day (TID) | ORAL | Status: DC | PRN
  Administered 2018-08-10 – 2018-08-14 (×5): 500 mg via ORAL
  Filled 2018-08-10 (×6): qty 1

## 2018-08-10 MED ORDER — AMOXICILLIN-POT CLAVULANATE 875-125 MG PO TABS
1.0000 | ORAL_TABLET | Freq: Two times a day (BID) | ORAL | Status: AC
  Administered 2018-08-10 – 2018-08-16 (×13): 1 via ORAL
  Filled 2018-08-10 (×17): qty 1

## 2018-08-10 MED ORDER — NICOTINE 21 MG/24HR TD PT24: 21.0000 mg | MEDICATED_PATCH | Freq: Once | TRANSDERMAL | Status: DC

## 2018-08-10 MED ORDER — DULOXETINE HCL 30 MG PO CPEP
30.0000 mg | ORAL_CAPSULE | Freq: Every day | ORAL | Status: DC
  Administered 2018-08-10: 30 mg via ORAL
  Filled 2018-08-10: qty 1

## 2018-08-10 MED ORDER — ACETAMINOPHEN 325 MG PO TABS
650.0000 mg | ORAL_TABLET | Freq: Four times a day (QID) | ORAL | Status: DC | PRN
  Administered 2018-08-10 – 2018-08-14 (×5): 650 mg via ORAL
  Filled 2018-08-10 (×5): qty 2

## 2018-08-10 MED ORDER — TRAZODONE HCL 50 MG PO TABS
50.0000 mg | ORAL_TABLET | Freq: Every evening | ORAL | Status: DC | PRN
  Administered 2018-08-10 – 2018-08-13 (×4): 50 mg via ORAL
  Filled 2018-08-10 (×4): qty 1

## 2018-08-10 MED ORDER — HYDROXYZINE HCL 25 MG PO TABS
25.0000 mg | ORAL_TABLET | Freq: Four times a day (QID) | ORAL | Status: AC | PRN
  Administered 2018-08-10 – 2018-08-13 (×5): 25 mg via ORAL
  Filled 2018-08-10 (×6): qty 1

## 2018-08-10 MED ORDER — NICOTINE 21 MG/24HR TD PT24
21.0000 mg | MEDICATED_PATCH | Freq: Once | TRANSDERMAL | Status: DC
  Administered 2018-08-10: 21 mg via TRANSDERMAL
  Filled 2018-08-10: qty 1

## 2018-08-10 MED ORDER — ALBUTEROL SULFATE HFA 108 (90 BASE) MCG/ACT IN AERS
INHALATION_SPRAY | RESPIRATORY_TRACT | Status: AC
  Administered 2018-08-10: 2 via RESPIRATORY_TRACT
  Filled 2018-08-10: qty 6.7

## 2018-08-10 NOTE — ED Notes (Signed)
PT HAS SIGNED VISITATION AND TELEPHONE CONSENT. IN ADDITION, THE PATIENT GIVES CONSENT FOR [facility] TO RELEASE INFORMATION FOR CARE TO HIS DAUGHTER.   CONSENT IN CHART

## 2018-08-10 NOTE — Progress Notes (Signed)
Patient ID: [name], male   DOB: 1965-12-21, 53 y.o.   MRN: [id number] Admission note  Pt is a 53 yo male that presents IVC'd by his daughter on 08/10/2018 with worsening depression and drug abuse. Pt states he is homeless and doesn't want to live anymore. "I bought $40 worth of heroin to OD on but it was crap". Pt denies a PCP. Pt denies any alcohol use. Pt denies any Rx abuse. Pt states he smokes 1 ppd. Pt is a high fall risk per detox protocol. Pt states his support is his daughter conan mcmanaway: [phone]. Pt denies any physical/sexual/verbal abuse either past or present. Pt denies any pain at this time, rating this a 0/10. Pt is vague in his assessment. Pt is pleasant and only requesting a meal tray at this time.   Consents signed, skin/belongings search completed and patient oriented to unit. Patient stable at this time. Patient given the opportunity to express concerns and ask questions. Patient given toiletries. Will continue to monitor.

## 2018-08-10 NOTE — ED Notes (Signed)
Complaint of nausea and retching.  Meds given.

## 2018-08-10 NOTE — Tx Team (Signed)
Initial Treatment Plan 08/10/2018 9:20 PM [facility] [id number]    PATIENT STRESSORS: Financial difficulties Health problems Substance abuse   PATIENT STRENGTHS: Active sense of humor Average or above average intelligence Capable of independent living Motivation for treatment/growth   PATIENT IDENTIFIED PROBLEMS: "stop being depressed"  "getting off drugs"  "finding housing"                 DISCHARGE CRITERIA:  Ability to meet basic life and health needs Adequate post-discharge living arrangements Improved stabilization in mood, thinking, and/or behavior Medical problems require only outpatient monitoring  PRELIMINARY DISCHARGE PLAN: Outpatient therapy Placement in alternative living arrangements  PATIENT/FAMILY INVOLVEMENT: This treatment plan has been presented to and reviewed with the patient, [name].  The patient and family have been given the opportunity to ask questions and make suggestions.  Baron Sane, RN 08/10/2018, 9:20 PM

## 2018-08-10 NOTE — Progress Notes (Signed)
Patient did not attend the evening speaker [facility] meeting. Pt was notified that group was beginning but returned to his room.

## 2018-08-10 NOTE — ED Notes (Signed)
IVC PAPERS GIVEN TO [employer].

## 2018-08-10 NOTE — ED Notes (Signed)
[facility]  Provider at bedside.

## 2018-08-10 NOTE — ED Notes (Signed)
Pt reports he is bleeding from his penis, scant bright red bleeding noted.  Dr Dayna Barker notified.

## 2018-08-10 NOTE — ED Notes (Signed)
TRANSFERRED WITH [employer] VIA IVC WITHOUT EVENT

## 2018-08-10 NOTE — Consult Note (Signed)
[facility] Consult   Reason for Consult:  Suicidal, depressed, drug abuse Referring Physician:  EDP Patient Identification: [name] MRN:  [id number] Principal Diagnosis: Cocaine abuse with cocaine-induced mood disorder ([place]) Diagnosis:  Principal Problem:   Cocaine abuse with cocaine-induced mood disorder ([place])   Total Time spent with patient: 45 minutes  Subjective:   [name] is a 53 y.o. male patient admitted with suicidal thoughts with plan.  HPI:  Patient who reports history of Major depression and polysubstance abuse. He was Ivc'd by his daughter after he sent a text message to her telling her that he wants to kill himself with a gun. He also told his daughter that he going to overdose on opiate and does not want narcan. Collateral information obtained from patient's daughter revealed that he has several past history of suicide attempts. Today, patient reports feeling depressed, hopeless, worthless, suicidal. States his major stressors are: homelessness, financial problem and inability to stop using drugs. He reports his drug of choice to be Cocaine, opiates and Cannabis.   Past Psychiatric History: as above   Risk to Self: Suicidal Ideation: Yes-Currently Present Suicidal Intent: Yes-Currently Present Is patient at risk for suicide?: Yes Suicidal Plan?: Yes-Currently Present Specify Current Suicidal Plan: pt states he plans to OD on heroin  Access to Means: Yes Specify Access to Suicidal Means: pt has access to heroin  What has been your use of drugs/alcohol within the last 12 months?: heroin, cocaine, cannabis  How many times?: 6 Other Self Harm Risks: hx of suicide attempts, homeless, lack of resources, depression, drug addiction, ongoing SI with current plan and access  Triggers for Past Attempts: Other personal contacts Intentional Self Injurious Behavior: None Risk to Others: Homicidal Ideation: No Thoughts of Harm to Others: No Current  Homicidal Intent: No Current Homicidal Plan: No Access to Homicidal Means: No History of harm to others?: No Assessment of Violence: None Noted Does patient have access to weapons?: No Criminal Charges Pending?: No Does patient have a court date: No Prior Inpatient Therapy: Prior Inpatient Therapy: No Prior Outpatient Therapy: Prior Outpatient Therapy: No Does patient have an ACCT team?: No Does patient have Intensive In-House Services?  : No Does patient have [facility] services? : No Does patient have P4CC services?: No  Past Medical History:  Past Medical History:  Diagnosis Date  . Headache(784.0)   . Heart murmur    child  . Hx of radiation therapy 05/19/13- 06/27/13   glottic larynx, right true vocal cord 6300 cGy 28 sessions  . Hypothyroidism   . Polysubstance abuse ([place])   . Shortness of breath   . 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 BALLOON DILATION N/A 12/30/2013   Procedure: BALLOON DILATION;  Surgeon: Garlan Fair, MD;  Location: [facility];  Service: Endoscopy;  Laterality: N/A;  . DIRECT LARYNGOSCOPY Right 04/24/2013   Procedure: DIRECT LARYNGOSCOPY and BIOPSY OF TONGUE;  Surgeon: Jerrell Belfast, MD;  Location: [facility];  Service: ENT;  Laterality: Right;  . ESOPHAGEAL MANOMETRY N/A 09/07/2014   Procedure: ESOPHAGEAL MANOMETRY (EM);  Surgeon: Garlan Fair, MD;  Location: [facility];  Service: Endoscopy;  Laterality: N/A;  . ESOPHAGOGASTRODUODENOSCOPY (EGD) WITH PROPOFOL N/A 12/30/2013   Procedure: ESOPHAGOGASTRODUODENOSCOPY (EGD) WITH PROPOFOL;  Surgeon: Garlan Fair, MD;  Location: [facility];  Service: Endoscopy;  Laterality: N/A;  . MICROLARYNGOSCOPY Right 04/24/2013  Procedure: MICROLARYNGOSCOPY and RIGHT VOCAL CORD BIOPSY;  Surgeon: Jerrell Belfast, MD;  Location: [facility];  Service: ENT;  Laterality: Right;  . MULTIPLE EXTRACTIONS WITH  ALVEOLOPLASTY N/A 09/30/2013   Procedure: Extraction of tooth #'s 2,3,4,5,6,7,8,9,10,11,12,15,22,23,24,25,26,27,28 with alveoloplasty;  Surgeon: Lenn Cal, DDS;  Location: [facility];  Service: Oral Surgery;  Laterality: N/A;  . TONSILLECTOMY     as a child  . wisdon teeth     age 18   Family History:  Family History  Problem Relation Age of Onset  . COPD Mother   . Emphysema Mother   . Heart attack Father    Family Psychiatric  History: Social History:  Social History   Substance and Sexual Activity  Alcohol Use No  . Alcohol/week: 0.0 standard drinks     Social History   Substance and Sexual Activity  Drug Use Yes  . Types: Marijuana, Cocaine   Comment: Heroin    Social History   Socioeconomic History  . Marital status: Legally Separated    Spouse name: Not on file  . Number of children: 3  . Years of education: Not on file  . 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 Financial resource strain: Not on file  . Food insecurity:    Worry: Not on file    Inability: Not on file  . Transportation needs:    Medical: Not on file    Non-medical: Not on file  Tobacco Use  . Smoking status: Current Every Day Smoker    Packs/day: 1.00    Years: 34.00    Pack years: 34.00    Types: Cigarettes  . Smokeless tobacco: Never Used  . Tobacco comment: DOWN TO 4 - 5 CIGARETTES A DAY  Substance and Sexual Activity  . Alcohol use: No    Alcohol/week: 0.0 standard drinks  . Drug use: Yes    Types: Marijuana, Cocaine    Comment: Heroin  . Sexual activity: Not on file  Lifestyle  . Physical activity:    Days per week: Not on file    Minutes per session: Not on file  . Stress: Not on file  Relationships  . Social connections:    Talks on phone: Not on file    Gets together: Not on file    Attends religious service: Not on file    Active member of club or organization: Not on  file    Attends meetings of clubs or organizations: Not on file    Relationship status: Not on file  Other Topics Concern  . Not on file  Social History Narrative   The patient is married. Patient has 3 children. 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   The patient has a history of smoking 2 packs of cigarette a day for 34 years. Patient current is trying to quit smoking. Patient denies use of alcohol. Patient denies use of other illicit drugs.   Additional Social History:    Allergies:   Allergies  Allergen Reactions  . Fentanyl Nausea And Vomiting  . Citalopram Other (See Comments)    Dizziness and shakiness.  . Morphine And Related Nausea And Vomiting    Labs:  Results for orders placed or performed during the hospital encounter of 08/09/18 (from the past 48 hour(s))  Comprehensive metabolic panel     Status: Abnormal   Collection Time: 08/09/18  8:26 PM  Result Value Ref Range   Sodium 142 135 - 145 mmol/L   Potassium 3.0 (L) 3.5 - 5.1 mmol/L    Comment: DELTA CHECK NOTED   Chloride 109 98 - 111 mmol/L   CO2 25 22 - 32 mmol/L   Glucose, Bld 119 (H) 70 - 99 mg/dL   BUN 25 (H) 6 - 20 mg/dL   Creatinine, Ser 1.34 (H) 0.61 - 1.24 mg/dL   Calcium 8.0 (L) 8.9 - 10.3 mg/dL   Total Protein 5.3 (L) 6.5 - 8.1 g/dL   Albumin 2.9 (L) 3.5 - 5.0 g/dL   AST 32 15 - 41 U/L   ALT 26 0 - 44 U/L   Alkaline Phosphatase 34 (L) 38 - 126 U/L   Total Bilirubin 0.2 (L) 0.3 - 1.2 mg/dL   GFR calc non Af Amer 60 (L) >60 mL/min   GFR calc Af Amer >60 >60 mL/min   Anion gap 8 5 - 15    Comment: Performed at [facility], [place] [address], [place], [place] [address]  cbc     Status: Abnormal   Collection Time: 08/09/18  8:26 PM  Result Value Ref Range   WBC 3.6 (L) 4.0 - 10.5 K/uL   RBC 3.75 (L) 4.22 - 5.81 MIL/uL   Hemoglobin 11.9 (L) 13.0 - 17.0 g/dL   HCT 36.2 (L) 39.0 - 52.0 %   MCV 96.5 80.0 - 100.0 fL   MCH 31.7 26.0 - 34.0 pg   MCHC 32.9 30.0 - 36.0 g/dL    RDW 12.9 11.5 - 15.5 %   Platelets 263 150 - 400 K/uL   nRBC 0.0 0.0 - 0.2 %    Comment: Performed at [facility], [place] [address], [place], [place] [address]  Ethanol     Status: None   Collection Time: 08/09/18  8:27 PM  Result Value Ref Range   Alcohol, Ethyl (B) <10 <10 mg/dL    Comment: (NOTE) Lowest detectable limit for serum alcohol is 10 mg/dL. For medical purposes only. Performed at [facility], [place] [address] [place], [place] [address]   Salicylate level     Status: None   Collection Time: 08/09/18  8:27 PM  Result Value Ref Range   Salicylate Lvl <4.7 2.8 - 30.0 mg/dL    Comment: Performed at [facility], [place] [address], [place], [place] [address]  Acetaminophen level     Status: Abnormal   Collection Time: 08/09/18  8:27 PM  Result Value Ref Range   Acetaminophen (Tylenol), Serum <10 (L) 10 - 30 ug/mL    Comment: (NOTE) Therapeutic concentrations vary significantly. A range of 10-30 ug/mL  may be an effective concentration for many patients. However, some  are best treated at concentrations outside of this range. Acetaminophen concentrations >150 ug/mL at 4 hours after ingestion  and >50 ug/mL at 12 hours after ingestion are often associated with  toxic reactions. Performed at [facility], [place] [address] [place],  [address]   Rapid urine drug screen (hospital performed)     Status: Abnormal   Collection Time: 08/09/18  8:27 PM  Result Value Ref Range   Opiates POSITIVE (A) NONE DETECTED   Cocaine POSITIVE (A) NONE DETECTED   Benzodiazepines NONE DETECTED NONE DETECTED   Amphetamines NONE DETECTED NONE DETECTED   Tetrahydrocannabinol NONE DETECTED NONE DETECTED   Barbiturates NONE DETECTED NONE DETECTED    Comment: (NOTE) DRUG SCREEN FOR  MEDICAL PURPOSES ONLY.  IF CONFIRMATION IS NEEDED FOR ANY PURPOSE, NOTIFY LAB WITHIN 5 DAYS. LOWEST DETECTABLE LIMITS FOR URINE DRUG  SCREEN Drug Class                     Cutoff (ng/mL) Amphetamine and metabolites    1000 Barbiturate and metabolites    200 Benzodiazepine                 161 Tricyclics and metabolites     300 Opiates and metabolites        300 Cocaine and metabolites        300 THC                            50 Performed at [facility], [place] [address], [place], [place] [address]   Urinalysis, Routine w reflex microscopic     Status: None   Collection Time: 08/10/18  2:21 AM  Result Value Ref Range   Color, Urine YELLOW YELLOW   APPearance CLEAR CLEAR   Specific Gravity, Urine 1.018 1.005 - 1.030   pH 6.0 5.0 - 8.0   Glucose, UA NEGATIVE NEGATIVE mg/dL   Hgb urine dipstick NEGATIVE NEGATIVE   Bilirubin Urine NEGATIVE NEGATIVE   Ketones, ur NEGATIVE NEGATIVE mg/dL   Protein, ur NEGATIVE NEGATIVE mg/dL   Nitrite NEGATIVE NEGATIVE   Leukocytes, UA NEGATIVE NEGATIVE    Comment: Performed at [facility] [place] [address], [place], [place] [address]    Current Facility-Administered Medications  Medication Dose Route Frequency Provider Last Rate Last Dose  . amoxicillin-clavulanate (AUGMENTIN) 875-125 MG per tablet 1 tablet  1 tablet Oral Q12H Milton Ferguson, MD   1 tablet at 08/10/18 0846  . dicyclomine (BENTYL) tablet 20 mg  20 mg Oral Q6H PRN Milton Ferguson, MD   20 mg at 08/09/18 2313  . DULoxetine (CYMBALTA) DR capsule 30 mg  30 mg Oral Daily Lopez Dentinger, MD      . gabapentin (NEURONTIN) capsule 300 mg  300 mg Oral BID Dimitrios Balestrieri, MD   300 mg at 08/10/18 1033  . hydrOXYzine (ATARAX/VISTARIL) tablet 25 mg  25 mg Oral Q6H PRN Milton Ferguson, MD   25 mg at 08/10/18 1018  . levothyroxine (SYNTHROID, LEVOTHROID) tablet 175 mcg  175 mcg Oral QAC breakfast Milton Ferguson, MD   175 mcg at 08/10/18 0521  . loperamide (IMODIUM) capsule 2-4 mg  2-4 mg Oral PRN Milton Ferguson, MD      . methocarbamol (ROBAXIN) tablet 500 mg  500 mg Oral Q8H PRN Milton Ferguson,  MD   500 mg at 08/10/18 0846  . naproxen (NAPROSYN) tablet 500 mg  500 mg Oral BID PRN Milton Ferguson, MD   500 mg at 08/09/18 2313  . ondansetron (ZOFRAN-ODT) disintegrating tablet 4 mg  4 mg Oral Q6H PRN Milton Ferguson, MD   4 mg at 08/10/18 1191   Current Outpatient Medications  Medication Sig Dispense Refill  . amoxicillin-clavulanate (AUGMENTIN) 875-125 MG tablet Take 1 tablet by mouth every 12 (twelve) hours for 7 days. (Patient not taking: Reported on 08/09/2018) 14 tablet 0  . levothyroxine (SYNTHROID, LEVOTHROID) 175 MCG tablet Take 1 tablet (175 mcg total) by mouth daily before breakfast for 30 days. Void previous prescription on file for levothyroxine (Patient not taking: Reported on 08/09/2018) 30 tablet 0    Musculoskeletal: Strength & Muscle Tone: within normal limits Gait & Station:  normal Patient leans: N/A  Psychiatric Specialty Exam: Physical Exam  Psychiatric: His speech is normal. He is withdrawn. Cognition and memory are normal. He expresses impulsivity. He exhibits a depressed mood. He expresses suicidal ideation. He expresses suicidal plans.    ROS  Blood pressure 98/79, pulse 74, temperature 98.4 F (36.9 C), temperature source Oral, resp. rate 18, height 5\' 11"  (1.803 m), weight 59 kg, SpO2 98 %.Body mass index is 18.13 kg/m.  General Appearance: Casual  Eye Contact:  Good  Speech:  Clear and Coherent  Volume:  Decreased  Mood:  Depressed  Affect:  Constricted  Thought Process:  Coherent and Linear  Orientation:  Full (Time, Place, and Person)  Thought Content:  Logical  Suicidal Thoughts:  Yes.  with intent/plan  Homicidal Thoughts:  No  Memory:  Immediate;   Good Recent;   Good Remote;   Good  Judgement:  Poor  Insight:  Shallow  Psychomotor Activity:  Psychomotor Retardation  Concentration:  Concentration: Fair and Attention Span: Fair  Recall:  Good  Fund of Knowledge:  Good  Language:  Good  Akathisia:  No  Handed:  Right  AIMS (if indicated):      Assets:  Communication Skills Desire for Improvement  ADL's:  Intact  Cognition:  WNL  Sleep:    fair     Treatment Plan Summary: Daily contact with patient to assess and evaluate symptoms and progress in treatment and Medication management  Gabapentin 300 mg bid for mood stabilization. Duloxetine 30 mg daily for depression  Disposition: Recommend psychiatric Inpatient admission when medically cleared.  Corena Pilgrim, MD 08/10/2018 12:26 PM

## 2018-08-11 DIAGNOSIS — F419 Anxiety disorder, unspecified: Secondary | ICD-10-CM

## 2018-08-11 DIAGNOSIS — G4700 Insomnia, unspecified: Secondary | ICD-10-CM

## 2018-08-11 DIAGNOSIS — F1414 Cocaine abuse with cocaine-induced mood disorder: Principal | ICD-10-CM

## 2018-08-11 LAB — CBC WITH DIFFERENTIAL/PLATELET
Abs Immature Granulocytes: 0.03 10*3/uL (ref 0.00–0.07)
BASOS ABS: 0 10*3/uL (ref 0.0–0.1)
Basophils Relative: 0 %
EOS PCT: 4 %
Eosinophils Absolute: 0.2 10*3/uL (ref 0.0–0.5)
HCT: 34.7 % — ABNORMAL LOW (ref 39.0–52.0)
Hemoglobin: 11.5 g/dL — ABNORMAL LOW (ref 13.0–17.0)
Immature Granulocytes: 1 %
LYMPHS PCT: 29 %
Lymphs Abs: 1.4 10*3/uL (ref 0.7–4.0)
MCH: 32.1 pg (ref 26.0–34.0)
MCHC: 33.1 g/dL (ref 30.0–36.0)
MCV: 96.9 fL (ref 80.0–100.0)
Monocytes Absolute: 0.3 10*3/uL (ref 0.1–1.0)
Monocytes Relative: 6 %
NRBC: 0 % (ref 0.0–0.2)
Neutro Abs: 2.8 10*3/uL (ref 1.7–7.7)
Neutrophils Relative %: 60 %
Platelets: 275 10*3/uL (ref 150–400)
RBC: 3.58 MIL/uL — ABNORMAL LOW (ref 4.22–5.81)
RDW: 13.1 % (ref 11.5–15.5)
WBC: 4.7 10*3/uL (ref 4.0–10.5)

## 2018-08-11 LAB — RETICULOCYTES
Immature Retic Fract: 11.1 % (ref 2.3–15.9)
RBC.: 3.58 MIL/uL — AB (ref 4.22–5.81)
Retic Count, Absolute: 30.4 10*3/uL (ref 19.0–186.0)
Retic Ct Pct: 0.9 % (ref 0.4–3.1)

## 2018-08-11 LAB — BASIC METABOLIC PANEL
ANION GAP: 7 (ref 5–15)
BUN: 27 mg/dL — ABNORMAL HIGH (ref 6–20)
CO2: 23 mmol/L (ref 22–32)
Calcium: 8 mg/dL — ABNORMAL LOW (ref 8.9–10.3)
Chloride: 110 mmol/L (ref 98–111)
Creatinine, Ser: 1.48 mg/dL — ABNORMAL HIGH (ref 0.61–1.24)
GFR calc Af Amer: >60 mL/min (ref >60)
GFR calc non Af Amer: 53 mL/min — ABNORMAL LOW (ref >60)
Glucose, Bld: 87 mg/dL (ref 70–99)
Potassium: 3.3 mmol/L — ABNORMAL LOW (ref 3.5–5.1)
Sodium: 140 mmol/L (ref 135–145)

## 2018-08-11 LAB — LIPID PANEL
Cholesterol: 213 mg/dL — ABNORMAL HIGH (ref 0–200)
HDL: 46 mg/dL (ref >40)
LDL Cholesterol: 143 mg/dL — ABNORMAL HIGH (ref 0–99)
Total CHOL/HDL Ratio: 4.6 RATIO
Triglycerides: 122 mg/dL (ref <150)
VLDL: 24 mg/dL (ref 0–40)

## 2018-08-11 LAB — HEMOGLOBIN A1C
Hgb A1c MFr Bld: 6.9 % — ABNORMAL HIGH (ref 4.8–5.6)
Mean Plasma Glucose: 151.33 mg/dL

## 2018-08-11 LAB — FOLATE: Folate: 8 ng/mL (ref >5.9)

## 2018-08-11 LAB — IRON AND TIBC
Iron: 57 ug/dL (ref 45–182)
Saturation Ratios: 24 % (ref 17.9–39.5)
TIBC: 235 ug/dL — ABNORMAL LOW (ref 250–450)
UIBC: 178 ug/dL

## 2018-08-11 LAB — TSH: TSH: 93.062 u[IU]/mL — ABNORMAL HIGH (ref 0.350–4.500)

## 2018-08-11 LAB — FERRITIN: Ferritin: 148 ng/mL (ref 24–336)

## 2018-08-11 LAB — VITAMIN B12: Vitamin B-12: 157 pg/mL — ABNORMAL LOW (ref 180–914)

## 2018-08-11 MED ORDER — PANTOPRAZOLE SODIUM 40 MG PO TBEC
40.0000 mg | DELAYED_RELEASE_TABLET | Freq: Every day | ORAL | Status: DC
  Administered 2018-08-11 – 2018-08-20 (×10): 40 mg via ORAL
  Filled 2018-08-11: qty 16
  Filled 2018-08-11 (×3): qty 1
  Filled 2018-08-11: qty 16
  Filled 2018-08-11 (×10): qty 1

## 2018-08-11 MED ORDER — QUETIAPINE FUMARATE 50 MG PO TABS
ORAL_TABLET | ORAL | Status: AC
  Administered 2018-08-11: 50 mg via ORAL
  Filled 2018-08-11: qty 1

## 2018-08-11 MED ORDER — QUETIAPINE FUMARATE 50 MG PO TABS
50.0000 mg | ORAL_TABLET | Freq: Once | ORAL | Status: AC
  Administered 2018-08-11: 50 mg via ORAL
  Filled 2018-08-11: qty 1

## 2018-08-11 MED ORDER — LEVOTHYROXINE SODIUM 25 MCG PO TABS
ORAL_TABLET | ORAL | Status: AC
  Administered 2018-08-11: 175 ug via ORAL
  Filled 2018-08-11: qty 7

## 2018-08-11 NOTE — [facility] Counselor (Signed)
Clinical Social Work Note  CSW was informed by nursing staff that patient is on contact precautions and is suffering fairly extensively with flu symptoms currently.  PSA will be attempted tomorrow.  Selmer Dominion, LCSW 08/11/2018, 2:59 PM

## 2018-08-11 NOTE — Progress Notes (Addendum)
Patient was able to sleep for a few hours. Awakened for synthroid admin and informed lab will be here shortly to draw blood. Patient's color poor, unsteady gait as he ambulated from bathroom. Reviewed past labs and patient tested positive for influenza B on 1/17 @0639  (previously positive on 1/9 but chart states he did not fill meds due to finances).  AC and NP informed, patient's room made private. Patient informed of test result, pitcher of fluids provided and patient's labs successfully drawn. Flu and fall precautions reviewed with patient. Patient verbalized understanding.

## 2018-08-11 NOTE — Plan of Care (Signed)
  Problem: Coping: Goal: Ability to verbalize frustrations and anger appropriately will improve Outcome: Progressing   D: Pt alert and oriented on the unit. Pt engaging with RN staff and other pts. Pt denies SI/HI, A/VH. Pt's affect was flat and mood depressed. Pt has been on contact precautions and in his room since testing positive for influenza b. Pt was resting most of the day in his bed. Pt is pleasant and cooperative. A: Education, support and encouragement provided, q15 minute safety checks remain in effect. Medications administered per MD orders. R: No reactions/side effects to medicine noted. Pt denies any concerns at this time, and verbally contracts for safety. Pt ambulating on the unit with no issues. Pt remains safe on and off the unit.

## 2018-08-11 NOTE — [facility] Group Notes (Signed)
[facility] Group Notes: (Clinical Social Work)   08/11/2018      Type of Therapy:  Group Therapy   Participation Level:  Did Not Attend despite MHT prompting   Selmer Dominion, LCSW 08/11/2018, 1:29 PM

## 2018-08-11 NOTE — Progress Notes (Signed)
D: Patient observed restless, pained and withdrawing on unit. Patient states, "I haven't slept in about 3 days. Not since I last used. I feel awful. Restless, shaky, my body and head ache." Also complained of SOB and states he take nebulizer treatments BID.  Patient's affect flat, anxious with congruent mood.  Pain rated at a 10/10.   A: Consulted with Gwenlyn Found, NP as patient did not have a protocol ordered. Orders received and protocol initiated along with nebulizer treatments and rescue inhaler. Reviewed abnormal K+ results with provider. Medicated per orders, prn vistaril, albuterol, tylenol, robaxin, and trazadone given for complaints. Medication education provided. Level III obs in place for safety. Emotional support offered. Patient encouraged to complete Suicide Safety Plan before discharge. Encouraged to attend and participate in unit programming.  Fall prevention plan in place and reviewed with patient as pt is a high fall risk.   R: Patient verbalizes understanding of POC, falls prevention education. On reassess, patient remains restless and withdrawing. Patient denies HI/AVH but endorses passive SI. Verbal contract with staff in place. He remains safe on level III obs. Will continue to monitor throughout the night.

## 2018-08-11 NOTE — H&P (Addendum)
Psychiatric Admission Assessment Adult  Patient Identification: [name] MRN:  [id number] Date of Evaluation:  08/11/2018 Chief Complaint:  MDD Polysubstance Abuse Principal Diagnosis: Cocaine abuse with cocaine-induced mood disorder ([place]) Diagnosis:  Principal Problem:   Cocaine abuse with cocaine-induced mood disorder ([place])  History of Present Illness: Per admission assessment note: [name] is a 53 y.o. male patient admitted with suicidal thoughts with plan  who reports history of Major depression and polysubstance abuse. He was Ivc'd by his daughter after he sent a text message to her telling her that he wants to kill himself with a gun. He also told his daughter that he going to overdose on opiate and does not want narcan. Collateral information obtained from patient's daughter revealed that he has several past history of suicide attempts. Today, patient reports feeling depressed, hopeless, worthless, suicidal. States his major stressors are: homelessness, financial problem and inability to stop using drugs. He reports his drug of choice to be Cocaine, opiates and Cannabis.    Evaluation: [name] was evaluated by NP and Psychiatric. Reports feeling depressed and hopelessness. Patient validates the information provided in the above assessment. continues to endorse passive suicidal ideations. Patient reports previous inpatient admission multiple admissions for long-term residential services and detox facilities.  Patient was restarted on previous antidepressants.  Substance abuse history to heroin and alcohol. Continue supportive care for flu related symptoms.  Chart reviewed: Previous suicidal attempts noted.  Denies auditory or visual hallucinations.  Support encouragement reassurance was provided.   Associated Signs/Symptoms: Depression Symptoms:  depressed mood, feelings of worthlessness/guilt, difficulty concentrating, anxiety, disturbed sleep, (Hypo) Manic Symptoms:   Distractibility, Irritable Mood, Anxiety Symptoms:  Excessive Worry, Psychotic Symptoms:  Hallucinations: None PTSD Symptoms: Avoidance:  Decreased Interest/Participation Total Time spent with patient: 15 minutes  Past Psychiatric History:   Is the patient at risk to self? Yes.    Has the patient been a risk to self in the past 6 months? Yes.    Has the patient been a risk to self within the distant past? No.  Is the patient a risk to others? No.  Has the patient been a risk to others in the past 6 months? No.  Has the patient been a risk to others within the distant past? No.   Prior Inpatient Therapy:   Prior Outpatient Therapy:    Alcohol Screening: 1. How often do you have a drink containing alcohol?: Never 2. How many drinks containing alcohol do you have on a typical day when you are drinking?: 1 or 2 3. How often do you have six or more drinks on one occasion?: Never AUDIT-C Score: 0 4. How often during the last year have you found that you were not able to stop drinking once you had started?: Never 5. How often during the last year have you failed to do what was normally expected from you becasue of drinking?: Never 6. How often during the last year have you needed a first drink in the morning to get yourself going after a heavy drinking session?: Never 7. How often during the last year have you had a feeling of guilt of remorse after drinking?: Never 8. How often during the last year have you been unable to remember what happened the night before because you had been drinking?: Never 9. Have you or someone else been injured as a result of your drinking?: No 10. Has a relative or friend or a doctor or another health worker been concerned about your  drinking or suggested you cut down?: No Alcohol Use Disorder Identification Test Final Score (AUDIT): 0 Substance Abuse History in the last 12 months:  Yes.   Consequences of Substance Abuse: NA Previous Psychotropic Medications:  No  Psychological Evaluations: No  Past Medical History:  Past Medical History:  Diagnosis Date  . Headache(784.0)   . Heart murmur    child  . Hx of radiation therapy 05/19/13- 06/27/13   glottic larynx, right true vocal cord 6300 cGy 28 sessions  . Hypothyroidism   . Polysubstance abuse ([place])   . Shortness of breath   . 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 BALLOON DILATION N/A 12/30/2013   Procedure: BALLOON DILATION;  Surgeon: Garlan Fair, MD;  Location: [facility];  Service: Endoscopy;  Laterality: N/A;  . DIRECT LARYNGOSCOPY Right 04/24/2013   Procedure: DIRECT LARYNGOSCOPY and BIOPSY OF TONGUE;  Surgeon: Jerrell Belfast, MD;  Location: [facility];  Service: ENT;  Laterality: Right;  . ESOPHAGEAL MANOMETRY N/A 09/07/2014   Procedure: ESOPHAGEAL MANOMETRY (EM);  Surgeon: Garlan Fair, MD;  Location: [facility];  Service: Endoscopy;  Laterality: N/A;  . ESOPHAGOGASTRODUODENOSCOPY (EGD) WITH PROPOFOL N/A 12/30/2013   Procedure: ESOPHAGOGASTRODUODENOSCOPY (EGD) WITH PROPOFOL;  Surgeon: Garlan Fair, MD;  Location: [facility];  Service: Endoscopy;  Laterality: N/A;  . MICROLARYNGOSCOPY Right 04/24/2013   Procedure: MICROLARYNGOSCOPY and RIGHT VOCAL CORD BIOPSY;  Surgeon: Jerrell Belfast, MD;  Location: [facility];  Service: ENT;  Laterality: Right;  . MULTIPLE EXTRACTIONS WITH ALVEOLOPLASTY N/A 09/30/2013   Procedure: Extraction of tooth #'s 2,3,4,5,6,7,8,9,10,11,12,15,22,23,24,25,26,27,28 with alveoloplasty;  Surgeon: Lenn Cal, DDS;  Location: [facility];  Service: Oral Surgery;  Laterality: N/A;  . TONSILLECTOMY     as a child  . wisdon teeth     age 13   Family History:  Family History  Problem Relation Age of Onset  . COPD Mother   . Emphysema Mother   . Heart attack Father    Family Psychiatric  History:  Tobacco Screening:   Social History:  Social  History   Substance and Sexual Activity  Alcohol Use No  . Alcohol/week: 0.0 standard drinks     Social History   Substance and Sexual Activity  Drug Use Yes  . Types: Marijuana, Cocaine, Heroin   Comment: Heroin    Additional Social History:                           Allergies:   Allergies  Allergen Reactions  . Fentanyl Nausea And Vomiting  . Citalopram Other (See Comments)    Dizziness and shakiness.  . Morphine And Related Nausea And Vomiting   Lab Results:  Results for orders placed or performed during the hospital encounter of 08/10/18 (from the past 48 hour(s))  Basic metabolic panel     Status: Abnormal   Collection Time: 08/11/18  6:40 AM  Result Value Ref Range   Sodium 140 135 - 145 mmol/L   Potassium 3.3 (L) 3.5 - 5.1 mmol/L   Chloride 110 98 - 111 mmol/L   CO2 23 22 - 32 mmol/L   Glucose, Bld 87 70 - 99 mg/dL   BUN 27 (H) 6 - 20 mg/dL   Creatinine, Ser 1.48 (H) 0.61 - 1.24 mg/dL   Calcium 8.0 (L) 8.9 - 10.3 mg/dL   GFR calc  non Af Amer 53 (L) >60 mL/min   GFR calc Af Amer >60 >60 mL/min   Anion gap 7 5 - 15    Comment: Performed at [facility], [place] [address], [place], [place] [address]  Lipid panel     Status: Abnormal   Collection Time: 08/11/18  6:40 AM  Result Value Ref Range   Cholesterol 213 (H) 0 - 200 mg/dL   Triglycerides 122 <150 mg/dL   HDL 46 >40 mg/dL   Total CHOL/HDL Ratio 4.6 RATIO   VLDL 24 0 - 40 mg/dL   LDL Cholesterol 143 (H) 0 - 99 mg/dL    Comment:        Total Cholesterol/HDL:CHD Risk Coronary Heart Disease Risk Table                     Men   Women  1/2 Average Risk   3.4   3.3  Average Risk       5.0   4.4  2 X Average Risk   9.6   7.1  3 X Average Risk  23.4   11.0        Use the calculated Patient Ratio above and the CHD Risk Table to determine the patient's CHD Risk.        ATP III CLASSIFICATION (LDL):  <100     mg/dL   Optimal  100-129  mg/dL   Near or Above                     Optimal  130-159  mg/dL   Borderline  160-189  mg/dL   High  >190     mg/dL   Very High Performed at [facility] [address], [place], [place] [address]   Hemoglobin A1c     Status: Abnormal   Collection Time: 08/11/18  6:40 AM  Result Value Ref Range   Hgb A1c MFr Bld 6.9 (H) 4.8 - 5.6 %    Comment: (NOTE) Pre diabetes:          5.7%-6.4% Diabetes:              >6.4% Glycemic control for   <7.0% adults with diabetes    Mean Plasma Glucose 151.33 mg/dL    Comment: Performed at [facility] [address] [place], [place] [address]  TSH     Status: Abnormal   Collection Time: 08/11/18  6:40 AM  Result Value Ref Range   TSH 93.062 (H) 0.350 - 4.500 uIU/mL    Comment: Performed by a 3rd Generation assay with a functional sensitivity of <=0.01 uIU/mL. Performed at [facility], [place] [address] [place], [place] [address]     Blood Alcohol level:  Lab Results  Component Value Date   [facility] <10 08/09/2018   ETH <10 29/52/8413    Metabolic Disorder Labs:  Lab Results  Component Value Date   HGBA1C 6.9 (H) 08/11/2018   MPG 151.33 08/11/2018   No results found for: PROLACTIN Lab Results  Component Value Date   CHOL 213 (H) 08/11/2018   TRIG 122 08/11/2018   HDL 46 08/11/2018   CHOLHDL 4.6 08/11/2018   VLDL 24 08/11/2018   LDLCALC 143 (H) 08/11/2018   LDLCALC 82 07/09/2014    Current Medications: Current Facility-Administered Medications  Medication Dose Route Frequency Provider Last Rate Last Dose  . acetaminophen (TYLENOL) tablet 650 mg  650 mg Oral Q6H PRN Romilda Garret,  Billey Chang, NP   650 mg at 08/10/18 2054  . albuterol (PROVENTIL HFA;VENTOLIN HFA) 108 (90 Base) MCG/ACT inhaler 2 puff  2 puff Inhalation Q4H PRN Lindon Romp A, NP   2 puff at 08/10/18 2053  . alum & mag hydroxide-simeth (MAALOX/MYLANTA) 200-200-20 MG/5ML suspension 30 mL  30 mL Oral Q4H PRN Ethelene Hal, NP      . amoxicillin-clavulanate  (AUGMENTIN) 875-125 MG per tablet 1 tablet  1 tablet Oral Q12H Ethelene Hal, NP   1 tablet at 08/11/18 0840  . cloNIDine (CATAPRES) tablet 0.1 mg  0.1 mg Oral QID Lindon Romp A, NP   0.1 mg at 08/11/18 0840   Followed by  . [START ON 08/13/2018] cloNIDine (CATAPRES) tablet 0.1 mg  0.1 mg Oral BH-qamhs [name], NP       Followed by  . [START ON 08/16/2018] cloNIDine (CATAPRES) tablet 0.1 mg  0.1 mg Oral QAC breakfast Lindon Romp A, NP      . dicyclomine (BENTYL) tablet 20 mg  20 mg Oral Q6H PRN [name], NP      . DULoxetine (CYMBALTA) DR capsule 30 mg  30 mg Oral Daily Ethelene Hal, NP   30 mg at 08/11/18 0841  . gabapentin (NEURONTIN) capsule 300 mg  300 mg Oral BID Ethelene Hal, NP   300 mg at 08/11/18 0841  . hydrOXYzine (ATARAX/VISTARIL) tablet 25 mg  25 mg Oral Q6H PRN Ethelene Hal, NP   25 mg at 08/10/18 2054  . ipratropium-albuterol (DUONEB) 0.5-2.5 (3) MG/3ML nebulizer solution 3 mL  3 mL Nebulization Q6H PRN Lindon Romp A, NP      . levothyroxine (SYNTHROID, LEVOTHROID) tablet 175 mcg  175 mcg Oral QAC breakfast Ethelene Hal, NP   175 mcg at 08/11/18 0617  . magnesium hydroxide (MILK OF MAGNESIA) suspension 30 mL  30 mL Oral Daily PRN Ethelene Hal, NP      . methocarbamol (ROBAXIN) tablet 500 mg  500 mg Oral Q8H PRN Lindon Romp A, NP   500 mg at 08/10/18 2119  . nicotine (NICODERM CQ - dosed in mg/24 hours) patch 21 mg  21 mg Transdermal Once Ethelene Hal, NP      . ondansetron (ZOFRAN-ODT) disintegrating tablet 4 mg  4 mg Oral Q6H PRN Lindon Romp A, NP   4 mg at 08/11/18 0053  . pantoprazole (PROTONIX) EC tablet 40 mg  40 mg Oral Daily [name], MD      . traZODone (DESYREL) tablet 50 mg  50 mg Oral QHS PRN Ethelene Hal, NP   50 mg at 08/10/18 2119   PTA Medications: Medications Prior to Admission  Medication Sig Dispense Refill Last Dose  . amoxicillin-clavulanate (AUGMENTIN) 875-125 MG  tablet Take 1 tablet by mouth every 12 (twelve) hours for 7 days. (Patient not taking: Reported on 08/09/2018) 14 tablet 0 Not Taking at Unknown time  . levothyroxine (SYNTHROID, LEVOTHROID) 175 MCG tablet Take 1 tablet (175 mcg total) by mouth daily before breakfast for 30 days. Void previous prescription on file for levothyroxine (Patient not taking: Reported on 08/09/2018) 30 tablet 0 Not Taking at Unknown time    Musculoskeletal: Strength & Muscle Tone: within normal limits Gait & Station: normal Patient leans: N/A  Psychiatric Specialty Exam: Physical Exam  Vitals reviewed. Constitutional: He appears well-developed.  Cardiovascular: Normal rate.  Neurological: He is alert.  Psychiatric: He has a normal mood and affect. His behavior  is normal.    Review of Systems  Psychiatric/Behavioral: Positive for depression, substance abuse and suicidal ideas. The patient is nervous/anxious.   All other systems reviewed and are negative.   Blood pressure 113/89, pulse 61, temperature 98.1 F (36.7 C), temperature source Oral, resp. rate 18, height 5\' 11"  (1.803 m), weight 59 kg, SpO2 99 %.Body mass index is 18.13 kg/m.  General Appearance: Disheveled and Guarded  Eye Contact:  Fair  Speech:  Clear and Coherent  Volume:  Normal  Mood:  Anxious and Depressed  Affect:  Blunt  Thought Process:  Coherent  Orientation:  Full (Time, Place, and Person)  Thought Content:  Logical  Suicidal Thoughts:  Yes.  without intent/plan  Homicidal Thoughts:  No  Memory:  Immediate;   Fair Recent;   Fair Remote;   Fair  Judgement:  Fair  Insight:  Fair  Psychomotor Activity:  Normal  Concentration:  Concentration: Fair  Recall:  [employer] of Knowledge:  Fair  Language:  Fair  Akathisia:  No  Handed:  Right  AIMS (if indicated):     Assets:  Communication Skills Desire for Improvement Social Support  ADL's:  Intact  Cognition:  WNL  Sleep:  Number of Hours: 3.25    Treatment Plan  Summary: Daily contact with patient to assess and evaluate symptoms and progress in treatment and Medication management     See SRA for medication management Observation Level/Precautions:  15 minute checks  Laboratory:  CBC Chemistry Profile UDS UA + repeat potassium level  Psychotherapy:  Individual and group session  Medications:  See SRA   Consultations:  CSW and Psychiatry   Discharge Concerns:  Safety, stabilization, and risk of access to medication and medication stabilization   Estimated LOS: 5-7days  Other:     Physician Treatment Plan for Primary Diagnosis: Cocaine abuse with cocaine-induced mood disorder ([place]) Long Term Goal(s): Improvement in symptoms so as ready for discharge  Short Term Goals: Ability to identify changes in lifestyle to reduce recurrence of condition will improve, Ability to verbalize feelings will improve, Ability to maintain clinical measurements within normal limits will improve and Compliance with prescribed medications will improve  Physician Treatment Plan for Secondary Diagnosis: Principal Problem:   Cocaine abuse with cocaine-induced mood disorder ([place])  Long Term Goal(s): Improvement in symptoms so as ready for discharge  Short Term Goals: Ability to identify changes in lifestyle to reduce recurrence of condition will improve, Ability to demonstrate self-control will improve, Ability to identify and develop effective coping behaviors will improve and Compliance with prescribed medications will improve  I certify that inpatient services furnished can reasonably be expected to improve the patient's condition.    Derrill [place], NP 1/19/202010:27 AM

## 2018-08-11 NOTE — [facility] Suicide Risk Assessment (Signed)
[facility] Admission Suicide Risk Assessment   Nursing information obtained from:  Patient Demographic factors:  Male, Low socioeconomic status, Living alone, Unemployed, Caucasian Current Mental Status:  Suicidal ideation indicated by patient, Plan includes specific time, place, or method, Intention to act on suicide plan, Self-harm thoughts, Belief that plan would result in death, Suicide plan, Self-harm behaviors Loss Factors:  Decline in physical health, Financial problems / change in socioeconomic status Historical Factors:  Prior suicide attempts, Impulsivity Risk Reduction Factors:  Positive social support, Employed, Positive therapeutic relationship, Sense of responsibility to family  Total Time spent with patient: 20 minutes Principal Problem: <principal problem not specified> Diagnosis:  Active Problems:   Cocaine abuse with cocaine-induced mood disorder ([place])  Subjective Data: Patient is seen and examined.  Patient is a 53 year old male with a past psychiatric history significant for polysubstance dependence, COPD, recent positive test for influenza who presented to the [facility] emergency department on 08/10/2026 with suicidal ideation.  The patient reportedly sent a text message to his daughter telling her that he wanted to kill himself with a gun, and also told his daughter that he wanted to overdose on opiates and did not want to have Narcan given to him.  Patient has had multiple emergency room visits because of unintentional overdose of opiates in the past.  He also had presented on 1/17 with a COPD exacerbation, and had been discharged.  His influenza B was diagnosed on 08/01/2018.  He denied any previous admissions for detox, he denied any previous admissions for residential substance abuse treatment.  He is essentially homeless.  He also has a past medical history of throat cancer in 2014 where he received radiation.  On admission his laboratories revealed a potassium of  3.0, a creatinine of 1.34, a lipase of 77, and a total protein of 5.3.  His white blood cell count was 3.6.  He is mildly anemic with a hemoglobin and hematocrit of 11.9 and 36.2 respectively.  Her urine creatinine was obtained which was 132.2.  Urine sodium was obtained that was 59.  His blood alcohol on admission was less than 10.  He did have a positive fecal occult blood.  His drug screen was positive for opiates and cocaine.  He was admitted to the hospital for evaluation and stabilization.  Continued Clinical Symptoms:  Alcohol Use Disorder Identification Test Final Score (AUDIT): 0 The "Alcohol Use Disorders Identification Test", Guidelines for Use in Primary Care, Second Edition.  World Pharmacologist [facility]). Score between 0-7:  no or low risk or alcohol related problems. Score between 8-15:  moderate risk of alcohol related problems. Score between 16-19:  high risk of alcohol related problems. Score 20 or above:  warrants further diagnostic evaluation for alcohol dependence and treatment.   CLINICAL FACTORS:   Depression:   Anhedonia Comorbid alcohol abuse/dependence Hopelessness Impulsivity Insomnia Alcohol/Substance Abuse/Dependencies   Musculoskeletal: Strength & Muscle Tone: within normal limits Gait & Station: normal Patient leans: N/A  Psychiatric Specialty Exam: Physical Exam  Nursing note and vitals reviewed. Constitutional: He is oriented to person, place, and time. He appears well-developed and well-nourished.  HENT:  Head: Normocephalic and atraumatic.  Respiratory: Effort normal.  Neurological: He is alert and oriented to person, place, and time.    ROS  Blood pressure 113/89, pulse 61, temperature 98.1 F (36.7 C), temperature source Oral, resp. rate 18, height 5\' 11"  (1.803 m), weight 59 kg, SpO2 99 %.Body mass index is 18.13 kg/m.  General Appearance: Disheveled  Eye Contact:  Minimal  Speech:  Normal Rate  Volume:  Decreased  Mood:  Depressed and  Dysphoric  Affect:  Congruent  Thought Process:  Coherent and Descriptions of Associations: Circumstantial  Orientation:  Full (Time, Place, and Person)  Thought Content:  Logical  Suicidal Thoughts:  Yes.  without intent/plan  Homicidal Thoughts:  No  Memory:  Immediate;   Fair Recent;   Fair Remote;   Fair  Judgement:  Impaired  Insight:  Lacking  Psychomotor Activity:  Decreased  Concentration:  Concentration: Fair and Attention Span: Fair  Recall:  [employer] of Knowledge:  Fair  Language:  Fair  Akathisia:  Negative  Handed:  Right  AIMS (if indicated):     Assets:  Desire for Improvement Resilience  ADL's:  Intact  Cognition:  WNL  Sleep:  Number of Hours: 3.25      COGNITIVE FEATURES THAT CONTRIBUTE TO RISK:  None    SUICIDE RISK:   Mild:  Suicidal ideation of limited frequency, intensity, duration, and specificity.  There are no identifiable plans, no associated intent, mild dysphoria and related symptoms, good self-control (both objective and subjective assessment), few other risk factors, and identifiable protective factors, including available and accessible social support.  PLAN OF CARE: Patient is seen and examined.  Patient is a 53 year old male with the above-stated past psychiatric history who is seen on admission secondary to suicidal ideation and polysubstance dependence.  He will be admitted to the hospital.  He will be integrated into the milieu.  He will be encouraged to attend groups.  Because of his COPD, influenza and possible underlying lung infection he will be placed on droplet precautions.  He is mildly anemic, and did have a fecal occult blood.  This will be rechecked.  He is on Naprosyn for pain, but because the elevation in his creatinine as well as his fecal blood occult blood this will be stopped.  He will also be placed on a proton pump inhibitor.  His Augmentin will be continued.  He will be placed on opiate withdrawal protocol.  He was also  placed on Cymbalta recently and that will be continued.  He has a history of hypothyroidism, and had a TSH drawn this morning.  Because of his lab abnormalities we will recheck his creatinine and iron studies with regard to his anemia.  I certify that inpatient services furnished can reasonably be expected to improve the patient's condition.   [name], MD 08/11/2018, 7:59 AM

## 2018-08-12 DIAGNOSIS — R45851 Suicidal ideations: Secondary | ICD-10-CM

## 2018-08-12 MED ORDER — NICOTINE 21 MG/24HR TD PT24
21.0000 mg | MEDICATED_PATCH | Freq: Every day | TRANSDERMAL | Status: DC
  Administered 2018-08-12 – 2018-08-20 (×9): 21 mg via TRANSDERMAL
  Filled 2018-08-12 (×11): qty 1

## 2018-08-12 NOTE — Progress Notes (Signed)
[facility] MD Progress Note  08/12/2018 11:31 AM [name]  MRN:  [id number] Subjective:  "I don't feel good."  Mr. Hamed found resting in bed. On droplet precautions for influenza. Presents with flat affect and appears fatigued. Recently started Augmentin for pneumonia. Continues to report generalized aches, cough, SOB on exertion, and dizziness when standing up. Encouraged to rise slowly and ask for assistance if needed. Reports one episode of vomiting last night but PRN Zofran was effective. Mild diaphoresis observed. Denies other withdrawal symptoms. Reports continuing depression, with chronic SI but no plan. Contracts for safety on the unit. Support and encouragement provided.  Per admission H&P: Patient is a 53 year old male with a past psychiatric history significant for polysubstance dependence, COPD, recent positive test for influenza who presented to the [facility] emergency department on 08/10/2026 with suicidal ideation.  The patient reportedly sent a text message to his daughter telling her that he wanted to kill himself with a gun, and also told his daughter that he wanted to overdose on opiates and did not want to have Narcan given to him.  Patient has had multiple emergency room visits because of unintentional overdose of opiates in the past.  He also had presented on 1/17 with a COPD exacerbation, and had been discharged.  His influenza B was diagnosed on 08/01/2018.  He denied any previous admissions for detox, he denied any previous admissions for residential substance abuse treatment.  He is essentially homeless.  He also has a past medical history of throat cancer in 2014 where he received radiation.  On admission his laboratories revealed a potassium of 3.0, a creatinine of 1.34, a lipase of 77, and a total protein of 5.3.  His white blood cell count was 3.6.  He is mildly anemic with a hemoglobin and hematocrit of 11.9 and 36.2 respectively.  Her urine creatinine was  obtained which was 132.2.  Urine sodium was obtained that was 59.  His blood alcohol on admission was less than 10.  He did have a positive fecal occult blood.  His drug screen was positive for opiates and cocaine.  He was admitted to the hospital for evaluation and stabilization.  Principal Problem: Cocaine abuse with cocaine-induced mood disorder (HCC) Diagnosis: Principal Problem:   Cocaine abuse with cocaine-induced mood disorder ([place])  Total Time spent with patient: 15 minutes  Past Psychiatric History: See admission H&P  Past Medical History:  Past Medical History:  Diagnosis Date  . Headache(784.0)   . Heart murmur    child  . Hx of radiation therapy 05/19/13- 06/27/13   glottic larynx, right true vocal cord 6300 cGy 28 sessions  . Hypothyroidism   . Polysubstance abuse ([place])   . Shortness of breath   . 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 BALLOON DILATION N/A 12/30/2013   Procedure: BALLOON DILATION;  Surgeon: Garlan Fair, MD;  Location: [facility];  Service: Endoscopy;  Laterality: N/A;  . DIRECT LARYNGOSCOPY Right 04/24/2013   Procedure: DIRECT LARYNGOSCOPY and BIOPSY OF TONGUE;  Surgeon: Jerrell Belfast, MD;  Location: [facility];  Service: ENT;  Laterality: Right;  . ESOPHAGEAL MANOMETRY N/A 09/07/2014   Procedure: ESOPHAGEAL MANOMETRY (EM);  Surgeon: Garlan Fair, MD;  Location: [facility];  Service: Endoscopy;  Laterality: N/A;  . ESOPHAGOGASTRODUODENOSCOPY (EGD) WITH PROPOFOL N/A 12/30/2013   Procedure: ESOPHAGOGASTRODUODENOSCOPY (EGD) WITH PROPOFOL;  Surgeon:  Garlan Fair, MD;  Location: [facility];  Service: Endoscopy;  Laterality: N/A;  . MICROLARYNGOSCOPY Right 04/24/2013   Procedure: MICROLARYNGOSCOPY and RIGHT VOCAL CORD BIOPSY;  Surgeon: Jerrell Belfast, MD;  Location: [facility];  Service: ENT;  Laterality: Right;  . MULTIPLE EXTRACTIONS WITH  ALVEOLOPLASTY N/A 09/30/2013   Procedure: Extraction of tooth #'s 2,3,4,5,6,7,8,9,10,11,12,15,22,23,24,25,26,27,28 with alveoloplasty;  Surgeon: Lenn Cal, DDS;  Location: [facility];  Service: Oral Surgery;  Laterality: N/A;  . TONSILLECTOMY     as a child  . wisdon teeth     age 52   Family History:  Family History  Problem Relation Age of Onset  . COPD Mother   . Emphysema Mother   . Heart attack Father    Family Psychiatric  History: See admission H&P Social History:  Social History   Substance and Sexual Activity  Alcohol Use No  . Alcohol/week: 0.0 standard drinks     Social History   Substance and Sexual Activity  Drug Use Yes  . Types: Marijuana, Cocaine, Heroin   Comment: Heroin    Social History   Socioeconomic History  . Marital status: Legally Separated    Spouse name: Not on file  . Number of children: 3  . Years of education: Not on file  . 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 Financial resource strain: Not on file  . Food insecurity:    Worry: Not on file    Inability: Not on file  . Transportation needs:    Medical: Not on file    Non-medical: Not on file  Tobacco Use  . Smoking status: Current Every Day Smoker    Packs/day: 1.00    Years: 34.00    Pack years: 34.00    Types: Cigarettes  . Smokeless tobacco: Never Used  . Tobacco comment: DOWN TO 4 - 5 CIGARETTES A DAY  Substance and Sexual Activity  . Alcohol use: No    Alcohol/week: 0.0 standard drinks  . Drug use: Yes    Types: Marijuana, Cocaine, Heroin    Comment: Heroin  . Sexual activity: Yes    Birth control/protection: Condom  Lifestyle  . Physical activity:    Days per week: Not on file    Minutes per session: Not on file  . Stress: Not on file  Relationships  . Social connections:    Talks on phone: Not on file    Gets together: Not on file    Attends religious service:  Not on file    Active member of club or organization: Not on file    Attends meetings of clubs or organizations: Not on file    Relationship status: Not on file  Other Topics Concern  . Not on file  Social History Narrative   The patient is married. Patient has 3 children. 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   The patient has a history of smoking 2 packs of cigarette a day for 34 years. Patient current is trying to quit smoking. Patient denies use of alcohol. Patient denies use of other illicit drugs.   Additional Social History:                         Sleep: Fair  Appetite:  Good  Current Medications: Current Facility-Administered Medications  Medication Dose Route Frequency Provider Last Rate  Last Dose  . acetaminophen (TYLENOL) tablet 650 mg  650 mg Oral Q6H PRN Ethelene Hal, NP   650 mg at 08/12/18 0805  . albuterol (PROVENTIL HFA;VENTOLIN HFA) 108 (90 Base) MCG/ACT inhaler 2 puff  2 puff Inhalation Q4H PRN Lindon Romp A, NP   2 puff at 08/12/18 0804  . alum & mag hydroxide-simeth (MAALOX/MYLANTA) 200-200-20 MG/5ML suspension 30 mL  30 mL Oral Q4H PRN Ethelene Hal, NP      . amoxicillin-clavulanate (AUGMENTIN) 875-125 MG per tablet 1 tablet  1 tablet Oral Q12H Ethelene Hal, NP   1 tablet at 08/12/18 0805  . cloNIDine (CATAPRES) tablet 0.1 mg  0.1 mg Oral QID Lindon Romp A, NP   0.1 mg at 08/12/18 0805   Followed by  . [START ON 08/13/2018] cloNIDine (CATAPRES) tablet 0.1 mg  0.1 mg Oral BH-qamhs [name], NP       Followed by  . [START ON 08/16/2018] cloNIDine (CATAPRES) tablet 0.1 mg  0.1 mg Oral QAC breakfast Lindon Romp A, NP      . dicyclomine (BENTYL) tablet 20 mg  20 mg Oral Q6H PRN [name], NP      . DULoxetine (CYMBALTA) DR capsule 30 mg  30 mg Oral Daily Ethelene Hal, NP   30 mg at 08/12/18 0805  . gabapentin (NEURONTIN) capsule 300 mg  300 mg Oral BID Ethelene Hal, NP   300 mg at 08/12/18 0805   . hydrOXYzine (ATARAX/VISTARIL) tablet 25 mg  25 mg Oral Q6H PRN Ethelene Hal, NP   25 mg at 08/12/18 0805  . ipratropium-albuterol (DUONEB) 0.5-2.5 (3) MG/3ML nebulizer solution 3 mL  3 mL Nebulization Q6H PRN Lindon Romp A, NP   3 mL at 08/11/18 2223  . levothyroxine (SYNTHROID, LEVOTHROID) tablet 175 mcg  175 mcg Oral QAC breakfast Ethelene Hal, NP   175 mcg at 08/12/18 7902  . magnesium hydroxide (MILK OF MAGNESIA) suspension 30 mL  30 mL Oral Daily PRN Ethelene Hal, NP      . methocarbamol (ROBAXIN) tablet 500 mg  500 mg Oral Q8H PRN Lindon Romp A, NP   500 mg at 08/12/18 0805  . nicotine (NICODERM CQ - dosed in mg/24 hours) patch 21 mg  21 mg Transdermal Once Ethelene Hal, NP      . ondansetron (ZOFRAN-ODT) disintegrating tablet 4 mg  4 mg Oral Q6H PRN Lindon Romp A, NP   4 mg at 08/11/18 0053  . pantoprazole (PROTONIX) EC tablet 40 mg  40 mg Oral Daily [name], MD   40 mg at 08/12/18 0805  . traZODone (DESYREL) tablet 50 mg  50 mg Oral QHS PRN Ethelene Hal, NP   50 mg at 08/11/18 2219    Lab Results:  Results for orders placed or performed during the hospital encounter of 08/10/18 (from the past 48 hour(s))  Basic metabolic panel     Status: Abnormal   Collection Time: 08/11/18  6:40 AM  Result Value Ref Range   Sodium 140 135 - 145 mmol/L   Potassium 3.3 (L) 3.5 - 5.1 mmol/L   Chloride 110 98 - 111 mmol/L   CO2 23 22 - 32 mmol/L   Glucose, Bld 87 70 - 99 mg/dL   BUN 27 (H) 6 - 20 mg/dL   Creatinine, Ser 1.48 (H) 0.61 - 1.24 mg/dL   Calcium 8.0 (L) 8.9 - 10.3 mg/dL   GFR calc non  Af Amer 53 (L) >60 mL/min   GFR calc Af Amer >60 >60 mL/min   Anion gap 7 5 - 15    Comment: Performed at [facility], [place] [address], [place], [place] [address]  Lipid panel     Status: Abnormal   Collection Time: 08/11/18  6:40 AM  Result Value Ref Range   Cholesterol 213 (H) 0 - 200 mg/dL   Triglycerides 122 <150 mg/dL    HDL 46 >40 mg/dL   Total CHOL/HDL Ratio 4.6 RATIO   VLDL 24 0 - 40 mg/dL   LDL Cholesterol 143 (H) 0 - 99 mg/dL    Comment:        Total Cholesterol/HDL:CHD Risk Coronary Heart Disease Risk Table                     Men   Women  1/2 Average Risk   3.4   3.3  Average Risk       5.0   4.4  2 X Average Risk   9.6   7.1  3 X Average Risk  23.4   11.0        Use the calculated Patient Ratio above and the CHD Risk Table to determine the patient's CHD Risk.        ATP III CLASSIFICATION (LDL):  <100     mg/dL   Optimal  100-129  mg/dL   Near or Above                    Optimal  130-159  mg/dL   Borderline  160-189  mg/dL   High  >190     mg/dL   Very High Performed at [facility] [place] [address], [place], [place] [address]   Hemoglobin A1c     Status: Abnormal   Collection Time: 08/11/18  6:40 AM  Result Value Ref Range   Hgb A1c MFr Bld 6.9 (H) 4.8 - 5.6 %    Comment: (NOTE) Pre diabetes:          5.7%-6.4% Diabetes:              >6.4% Glycemic control for   <7.0% adults with diabetes    Mean Plasma Glucose 151.33 mg/dL    Comment: Performed at [facility] [address], [place], [place] [address]  TSH     Status: Abnormal   Collection Time: 08/11/18  6:40 AM  Result Value Ref Range   TSH 93.062 (H) 0.350 - 4.500 uIU/mL    Comment: Performed by a 3rd Generation assay with a functional sensitivity of <=0.01 uIU/mL. Performed at [facility], [place] [address], [place], [place] [address]   CBC with Differential/Platelet     Status: Abnormal   Collection Time: 08/11/18  6:45 PM  Result Value Ref Range   WBC 4.7 4.0 - 10.5 K/uL   RBC 3.58 (L) 4.22 - 5.81 MIL/uL   Hemoglobin 11.5 (L) 13.0 - 17.0 g/dL   HCT 34.7 (L) 39.0 - 52.0 %   MCV 96.9 80.0 - 100.0 fL   MCH 32.1 26.0 - 34.0 pg   MCHC 33.1 30.0 - 36.0 g/dL   RDW 13.1 11.5 - 15.5 %   Platelets 275 150 - 400 K/uL   nRBC 0.0 0.0 - 0.2 %   Neutrophils Relative % 60 %    Neutro Abs 2.8 1.7 - 7.7 K/uL   Lymphocytes Relative 29 %  Lymphs Abs 1.4 0.7 - 4.0 K/uL   Monocytes Relative 6 %   Monocytes Absolute 0.3 0.1 - 1.0 K/uL   Eosinophils Relative 4 %   Eosinophils Absolute 0.2 0.0 - 0.5 K/uL   Basophils Relative 0 %   Basophils Absolute 0.0 0.0 - 0.1 K/uL   Immature Granulocytes 1 %   Abs Immature Granulocytes 0.03 0.00 - 0.07 K/uL    Comment: Performed at [facility], [place] [address], [place], [place] [address]  Vitamin B12     Status: Abnormal   Collection Time: 08/11/18  6:45 PM  Result Value Ref Range   Vitamin B-12 157 (L) 180 - 914 pg/mL    Comment: (NOTE) This assay is not validated for testing neonatal or myeloproliferative syndrome specimens for Vitamin B12 levels. Performed at [facility], [place] [address] [place], [place] [address]   Folate     Status: None   Collection Time: 08/11/18  6:45 PM  Result Value Ref Range   Folate 8.0 >5.9 ng/mL    Comment: Performed at [facility], [place] [address], [place], [place] [address]  Iron and TIBC     Status: Abnormal   Collection Time: 08/11/18  6:45 PM  Result Value Ref Range   Iron 57 45 - 182 ug/dL   TIBC 235 (L) 250 - 450 ug/dL   Saturation Ratios 24 17.9 - 39.5 %   UIBC 178 ug/dL    Comment: Performed at [facility], [place] [address] [place], [place] [address]  Ferritin     Status: None   Collection Time: 08/11/18  6:45 PM  Result Value Ref Range   Ferritin 148 24 - 336 ng/mL    Comment: Performed at [facility], [place] [address], [place], [place] [address]  Reticulocytes     Status: Abnormal   Collection Time: 08/11/18  6:45 PM  Result Value Ref Range   Retic Ct Pct 0.9 0.4 - 3.1 %   RBC. 3.58 (L) 4.22 - 5.81 MIL/uL   Retic Count, Absolute 30.4 19.0 - 186.0 K/uL   Immature Retic Fract 11.1 2.3 - 15.9 %    Comment: Performed at [facility], [place] [address]  [place], [place] [address]    Blood Alcohol level:  Lab Results  Component Value Date   [facility] <10 08/09/2018   ETH <10 62/83/1517    Metabolic Disorder Labs: Lab Results  Component Value Date   HGBA1C 6.9 (H) 08/11/2018   MPG 151.33 08/11/2018   No results found for: PROLACTIN Lab Results  Component Value Date   CHOL 213 (H) 08/11/2018   TRIG 122 08/11/2018   HDL 46 08/11/2018   CHOLHDL 4.6 08/11/2018   VLDL 24 08/11/2018   LDLCALC 143 (H) 08/11/2018   LDLCALC 82 07/09/2014    Physical Findings: AIMS: Facial and Oral Movements Muscles of Facial Expression: None, normal Lips and Perioral Area: None, normal Jaw: None, normal Tongue: None, normal,Extremity Movements Upper (arms, wrists, hands, fingers): None, normal Lower (legs, knees, ankles, toes): None, normal, Trunk Movements Neck, shoulders, hips: None, normal, Overall Severity Severity of abnormal movements (highest score from questions above): None, normal Incapacitation due to abnormal movements: None, normal Patient's awareness of abnormal movements (rate only patient's report): No Awareness, Dental Status Current problems with teeth and/or dentures?: No Does patient usually wear dentures?: No  CIWA:    COWS:  COWS Total Score: 5  Musculoskeletal: Strength & Muscle Tone:  within normal limits Gait & Station: normal Patient leans: N/A  Psychiatric Specialty Exam: Physical Exam  Nursing note and vitals reviewed. Constitutional: He is oriented to person, place, and time.  Cardiovascular: Normal rate.  Respiratory: Effort normal.  Neurological: He is alert and oriented to person, place, and time.    Review of Systems  Constitutional: Positive for malaise/fatigue.  Respiratory: Positive for cough and shortness of breath.   Cardiovascular: Negative.   Gastrointestinal: Positive for nausea and vomiting.  Musculoskeletal: Positive for myalgias.  Psychiatric/Behavioral: Positive for depression, substance abuse (hx  cocaine, opioids, THC) and suicidal ideas (chronic passive SI). Negative for hallucinations and memory loss. The patient is not nervous/anxious and does not have insomnia.     Blood pressure 98/75, pulse 79, temperature 97.9 F (36.6 C), temperature source Oral, resp. rate 18, height 5\' 11"  (1.803 m), weight 59 kg, SpO2 100 %.Body mass index is 18.13 kg/m.  General Appearance: Disheveled  Eye Contact:  Fair  Speech:  Clear and Coherent and Slow  Volume:  Normal  Mood:  Depressed  Affect:  Flat  Thought Process:  Coherent  Orientation:  Full (Time, Place, and Person)  Thought Content:  WDL  Suicidal Thoughts:  Yes.  without intent/plan Chronic passive SI. Contracts for safety on the unit.  Homicidal Thoughts:  No  Memory:  Immediate;   Fair  Judgement:  Fair  Insight:  Fair  Psychomotor Activity:  Normal  Concentration:  Concentration: Good  Recall:  Good  Fund of Knowledge:  Fair  Language:  Fair  Akathisia:  No  Handed:  Right  AIMS (if indicated):     Assets:  Communication Skills Desire for Improvement Resilience  ADL's:  Intact  Cognition:  WNL  Sleep:  Number of Hours: 6.25     Treatment Plan Summary: Daily contact with patient to assess and evaluate symptoms and progress in treatment and Medication management   Continue inpatient hospitalization.  Continue Augmentin 875-125 mg PO Q12HR for pneumonia Continue Duoneb nebulizer Q6HR PRN wheezing, SOB Continue clonidine COWS protocol for opioid detox Continue Cymbalta 30 mg PO daily for mood Continue gabapentin 300 mg PO BID for anxiety/agitation Continue Vistaril 25 mg PO Q6HR PRN anxiety Continue trazodone 50 mg PO QHS PRN insomnia Continue Synthroid 175 mcg PO daily for hypothyroidism  Patient will participate in the therapeutic group milieu.  Discharge disposition in progress.   Connye Burkitt, NP 08/12/2018, 11:31 AM

## 2018-08-12 NOTE — Progress Notes (Signed)
Recreation Therapy Notes  Date: 1.20.20   Time: 0930 Location: 300 Hall Dayroom  Group Topic: Stress Management  Goal Area(s) Addresses:  Patient will identify stress management techniques. Patient will identify benefit of using stress management post d/c.   Intervention: Stress Management  Activity : Guided Imagery.  LRT introduced the stress management technique of guided imagery.  LRT read a script that let patients envision laying outside in a meadow at summer time.  Patients were to listen and follow along as script was read to engage in activity.  Education:  Stress Management, Discharge Planning.   Education Outcome: Acknowledges Education  Clinical Observations/Feedback: Pt did not attend group.    [name], LRT/CTRS         [name] A 08/12/2018 12:07 PM

## 2018-08-12 NOTE — Progress Notes (Signed)
Psychoeducational Group Note  Date:  08/12/2018 Time:  2400  Group Topic/Focus:  Wrap-Up Group:   The focus of this group is to help patients review their daily goal of treatment and discuss progress on daily workbooks.  Participation Level: Did Not Attend  Participation Quality:  Not Applicable  Affect:  Not Applicable  Cognitive:  Not Applicable  Insight:  Not Applicable  Engagement in Group: Not Applicable  Additional Comments:  The patient did not attend last evening's A.A.meeting since he was not feeling well.   Sallie Maker S 08/12/2018, 12:00 AM

## 2018-08-12 NOTE — [facility] Group Notes (Signed)
LCSW Group Therapy Note   08/12/2018 1:15pm   Type of Therapy and Topic:  Group Therapy:  Overcoming Obstacles   Participation Level:  Did Not Attend--pt resting in room (positive for the flu/unit restriction).    Description of Group:    In this group patients will be encouraged to explore what they see as obstacles to their own wellness and recovery. They will be guided to discuss their thoughts, feelings, and behaviors related to these obstacles. The group will process together ways to cope with barriers, with attention given to specific choices patients can make. Each patient will be challenged to identify changes they are motivated to make in order to overcome their obstacles. This group will be process-oriented, with patients participating in exploration of their own experiences as well as giving and receiving support and challenge from other group members.   Therapeutic Goals: 1. Patient will identify personal and current obstacles as they relate to admission. 2. Patient will identify barriers that currently interfere with their wellness or overcoming obstacles.  3. Patient will identify feelings, thought process and behaviors related to these barriers. 4. Patient will identify two changes they are willing to make to overcome these obstacles:      Summary of Patient Progress   x   Therapeutic Modalities:   Cognitive Behavioral Therapy Solution Focused Therapy Motivational Interviewing Relapse Prevention Therapy  Avelina Laine, LCSW 08/12/2018 2:39 PM

## 2018-08-12 NOTE — Progress Notes (Addendum)
Patient ID: JOESEPH VERVILLE, male   DOB: July 19, 1966, 53 y.o.   MRN: [id number]  Pt currently presents with a flat affect and cooperative behavior. Pt reports to writer that their goal is to "feel better." Pt has concerns his ear infection is not getting better. Pt reports an ongoing feeling of fullness in his head and swelling in his hands. Pt has a dry cough. Pt also reports difficulty swallowing his antibiotics due to lingering effects of throat cancer treatment. Pt reports good sleep with current medication regimen.   Pt provided with medications per providers orders. Pt's labs and vitals were monitored throughout the night. Pt supported emotionally and encouraged to express concerns and questions. Pt educated on medications and side effects. Maintained droplet precautions due to positive flu test.   Pt's safety ensured with 15 minute and environmental checks. Pt has passive SI, states "I just don't want to be here." Pt currently denies HI and A/V hallucinations. Pt verbally agrees to seek staff if SI worsens, HI or A/VH occurs and to consult with staff before acting on any harmful thoughts. Will continue POC.

## 2018-08-12 NOTE — Tx Team (Signed)
Interdisciplinary Treatment and Diagnostic Plan Update  08/12/2018 Time of Session: 0830AM [name] MRN: [id number]  Principal Diagnosis: Cocaine abuse with cocaine-induced mood disorder [facility])  Secondary Diagnoses: Principal Problem:   Cocaine abuse with cocaine-induced mood disorder ([name])   Current Medications:  Current Facility-Administered Medications  Medication Dose Route Frequency Provider Last Rate Last Dose  . acetaminophen (TYLENOL) tablet 650 mg  650 mg Oral Q6H PRN Ethelene Hal, NP   650 mg at 08/12/18 0805  . albuterol (PROVENTIL HFA;VENTOLIN HFA) 108 (90 Base) MCG/ACT inhaler 2 puff  2 puff Inhalation Q4H PRN Lindon Romp A, NP   2 puff at 08/12/18 0804  . alum & mag hydroxide-simeth (MAALOX/MYLANTA) 200-200-20 MG/5ML suspension 30 mL  30 mL Oral Q4H PRN Ethelene Hal, NP      . amoxicillin-clavulanate (AUGMENTIN) 875-125 MG per tablet 1 tablet  1 tablet Oral Q12H Ethelene Hal, NP   1 tablet at 08/12/18 0805  . cloNIDine (CATAPRES) tablet 0.1 mg  0.1 mg Oral QID Lindon Romp A, NP   0.1 mg at 08/12/18 0805   Followed by  . [START ON 08/13/2018] cloNIDine (CATAPRES) tablet 0.1 mg  0.1 mg Oral BH-qamhs [name], NP       Followed by  . [START ON 08/16/2018] cloNIDine (CATAPRES) tablet 0.1 mg  0.1 mg Oral QAC breakfast Lindon Romp A, NP      . dicyclomine (BENTYL) tablet 20 mg  20 mg Oral Q6H PRN [name], NP      . DULoxetine (CYMBALTA) DR capsule 30 mg  30 mg Oral Daily Ethelene Hal, NP   30 mg at 08/12/18 0805  . gabapentin (NEURONTIN) capsule 300 mg  300 mg Oral BID Ethelene Hal, NP   300 mg at 08/12/18 0805  . hydrOXYzine (ATARAX/VISTARIL) tablet 25 mg  25 mg Oral Q6H PRN Ethelene Hal, NP   25 mg at 08/12/18 0805  . ipratropium-albuterol (DUONEB) 0.5-2.5 (3) MG/3ML nebulizer solution 3 mL  3 mL Nebulization Q6H PRN Lindon Romp A, NP   3 mL at 08/11/18 2223  . levothyroxine (SYNTHROID, LEVOTHROID) tablet  175 mcg  175 mcg Oral QAC breakfast Ethelene Hal, NP   175 mcg at 08/12/18 7619  . magnesium hydroxide (MILK OF MAGNESIA) suspension 30 mL  30 mL Oral Daily PRN Ethelene Hal, NP      . methocarbamol (ROBAXIN) tablet 500 mg  500 mg Oral Q8H PRN Lindon Romp A, NP   500 mg at 08/12/18 0805  . nicotine (NICODERM CQ - dosed in mg/24 hours) patch 21 mg  21 mg Transdermal Once Ethelene Hal, NP      . ondansetron (ZOFRAN-ODT) disintegrating tablet 4 mg  4 mg Oral Q6H PRN Lindon Romp A, NP   4 mg at 08/11/18 0053  . pantoprazole (PROTONIX) EC tablet 40 mg  40 mg Oral Daily [name], MD   40 mg at 08/12/18 0805  . traZODone (DESYREL) tablet 50 mg  50 mg Oral QHS PRN Ethelene Hal, NP   50 mg at 08/11/18 2219   PTA Medications: Medications Prior to Admission  Medication Sig Dispense Refill Last Dose  . amoxicillin-clavulanate (AUGMENTIN) 875-125 MG tablet Take 1 tablet by mouth every 12 (twelve) hours for 7 days. (Patient not taking: Reported on 08/09/2018) 14 tablet 0 Not Taking at Unknown time  . levothyroxine (SYNTHROID, LEVOTHROID) 175 MCG tablet Take 1 tablet (175 mcg total) by mouth  daily before breakfast for 30 days. Void previous prescription on file for levothyroxine (Patient not taking: Reported on 08/09/2018) 30 tablet 0 Not Taking at Unknown time    Patient Stressors: Financial difficulties Health problems Substance abuse  Patient Strengths: Active sense of humor Average or above average intelligence Capable of independent living Motivation for treatment/growth  Treatment Modalities: Medication Management, Group therapy, Case management,  1 to 1 session with clinician, Psychoeducation, Recreational therapy.   Physician Treatment Plan for Primary Diagnosis: Cocaine abuse with cocaine-induced mood disorder ([name]) Long Term Goal(s): Improvement in symptoms so as ready for discharge Improvement in symptoms so as ready for discharge   Short Term  Goals: Ability to identify changes in lifestyle to reduce recurrence of condition will improve Ability to verbalize feelings will improve Ability to maintain clinical measurements within normal limits will improve Compliance with prescribed medications will improve Ability to identify changes in lifestyle to reduce recurrence of condition will improve Ability to demonstrate self-control will improve Ability to identify and develop effective coping behaviors will improve Compliance with prescribed medications will improve  Medication Management: Evaluate patient's response, side effects, and tolerance of medication regimen.  Therapeutic Interventions: 1 to 1 sessions, Unit Group sessions and Medication administration.  Evaluation of Outcomes: Progressing  Physician Treatment Plan for Secondary Diagnosis: Principal Problem:   Cocaine abuse with cocaine-induced mood disorder ([name])  Long Term Goal(s): Improvement in symptoms so as ready for discharge Improvement in symptoms so as ready for discharge   Short Term Goals: Ability to identify changes in lifestyle to reduce recurrence of condition will improve Ability to verbalize feelings will improve Ability to maintain clinical measurements within normal limits will improve Compliance with prescribed medications will improve Ability to identify changes in lifestyle to reduce recurrence of condition will improve Ability to demonstrate self-control will improve Ability to identify and develop effective coping behaviors will improve Compliance with prescribed medications will improve     Medication Management: Evaluate patient's response, side effects, and tolerance of medication regimen.  Therapeutic Interventions: 1 to 1 sessions, Unit Group sessions and Medication administration.  Evaluation of Outcomes: Progressing   RN Treatment Plan for Primary Diagnosis: Cocaine abuse with cocaine-induced mood disorder ([name]) Long Term Goal(s):  Knowledge of disease and therapeutic regimen to maintain health will improve  Short Term Goals: Ability to remain free from injury will improve, Ability to verbalize frustration and anger appropriately will improve, Ability to demonstrate self-control and Ability to disclose and discuss suicidal ideas  Medication Management: RN will administer medications as ordered by provider, will assess and evaluate patient's response and provide education to patient for prescribed medication. RN will report any adverse and/or side effects to prescribing provider.  Therapeutic Interventions: 1 on 1 counseling sessions, Psychoeducation, Medication administration, Evaluate responses to treatment, Monitor vital signs and CBGs as ordered, Perform/monitor CIWA, COWS, AIMS and Fall Risk screenings as ordered, Perform wound care treatments as ordered.  Evaluation of Outcomes: Progressing   LCSW Treatment Plan for Primary Diagnosis: Cocaine abuse with cocaine-induced mood disorder ([place]) Long Term Goal(s): Safe transition to appropriate next level of care at discharge, Engage patient in therapeutic group addressing interpersonal concerns.  Short Term Goals: Engage patient in aftercare planning with referrals and resources, Increase emotional regulation, Facilitate patient progression through stages of change regarding substance use diagnoses and concerns and Identify triggers associated with mental health/substance abuse issues  Therapeutic Interventions: Assess for all discharge needs, 1 to 1 time with Social worker, Explore available resources and support  systems, Assess for adequacy in community support network, Educate family and significant other(s) on suicide prevention, Complete Psychosocial Assessment, Interpersonal group therapy.  Evaluation of Outcomes: Progressing   Progress in Treatment: Attending groups: Yes. Participating in groups: Yes. Taking medication as prescribed: Yes. Toleration medication:  Yes. Family/Significant other contact made: No, will contact:  family member if pt consents to collateral contact.  Patient understands diagnosis: Yes. Discussing patient identified problems/goals with staff: Yes. Medical problems stabilized or resolved: Yes. Denies suicidal/homicidal ideation: No. Pt able to contract for safety on the unit. (passive SI reported).  Issues/concerns per patient self-inventory: No. Other: n/a   New problem(s) identified: Yes, Describe:  pt tested positive for the flu and is on droplet precautions.  New Short Term/Long Term Goal(s): detox, medication management for mood stabilization; elimination of SI thoughts; development of comprehensive mental wellness/sobriety plan.   Patient Goals:  "to get off drugs, stop being depressed, and get help finding housing."   Discharge Plan or Barriers: CSW assessing for appropriate referrals. Pt has no current outpatient providers and is living in a camper/identifes as homeless. [place] pamphlet, Mobile Crisis information, and AA/NA information provided to patient for additional community support and resources.   Reason for Continuation of Hospitalization: Anxiety Depression Medication stabilization Suicidal ideation Withdrawal symptoms  Estimated Length of Stay: Thursday, 08/15/2018  Attendees: Patient: [name] 08/12/2018 8:59 AM  Physician: Dr. Mallie Darting MD 08/12/2018 8:59 AM  Nursing: Clarise Cruz RN; Estill Bamberg RN 08/12/2018 8:59 AM  RN Care Manager:x 08/12/2018 8:59 AM  Social Worker: Janice Norrie LCSW 08/12/2018 8:59 AM  Recreational Therapist: x 08/12/2018 8:59 AM  Other: Harriett Sine NP 08/12/2018 8:59 AM  Other:  08/12/2018 8:59 AM  Other: 08/12/2018 8:59 AM    Scribe for Treatment Team: Avelina Laine, LCSW 08/12/2018 8:59 AM
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
## 2018-08-12 NOTE — BHH Suicide Risk Assessment (Signed)
[facility] INPATIENT:  Family/Significant Other Suicide Prevention Education  Suicide Prevention Education:  Education Completed; Toney Difatta (pt's daughter) [phone] has been identified by the patient as the family member/significant other with whom the patient will be residing, and identified as the person(s) who will aid the patient in the event of a mental health crisis (suicidal ideations/suicide attempt).  With written consent from the patient, the family member/significant other has been provided the following suicide prevention education, prior to the and/or following the discharge of the patient.  The suicide prevention education provided includes the following:  Suicide risk factors  Suicide prevention and interventions  National Suicide Hotline telephone number  [facility] assessment telephone number  [facility] Emergency Assistance [place] and/or Residential Mobile Crisis Unit telephone number  Request made of family/significant other to:  Remove weapons (e.g., guns, rifles, knives), all items previously/currently identified as safety concern.    Remove drugs/medications (over-the-counter, prescriptions, illicit drugs), all items previously/currently identified as a safety concern.  The family member/significant other verbalizes understanding of the suicide prevention education information provided.  The family member/significant other agrees to remove the items of safety concern listed above.  SPE and aftercare reviewed with pt's daughter. She is supportive of pt being referred to [facility]. 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 She confirmed that pt has been living in a camper with his brother who is also a drug user since being kicked out of his ex-girlfriend's home 2-3 weeks ago.   Avelina Laine LCSW 08/12/2018,  3:28 PM

## 2018-08-12 NOTE — Progress Notes (Signed)
D: Patient observed restless in room. Continues to feel quite poorly with detox and flu symptoms. Patient marginally better over this time last night though color remains poor. Patient states, "I'm just trying to get through it. I still feel dizzy when I sit up." Patient's affect pained, mood depressed.  Reports generalized pain at a 4/10 and patient received tylenol prior to shift change. Patient continues to have cough, SOB, mild sweating. Afebrile.   A: Medicated per orders, prn vistaril, robaxin, trazadone and nebulizer tx given for complaints. Medication education provided. Level III obs in place for safety along with droplet precautions. Emotional support offered. Patient encouraged to complete Suicide Safety Plan before discharge. Encouraged to attend and participate in unit programming.  Fall prevention plan in place and reviewed with patient as pt is a high fall risk. Reoriented patient to call bell on wall and strongly encouraged him to use it. Reminded him MHT would be coming around frequently.    R: Patient verbalizes understanding of POC, falls prevention education. On reassess, patient has been able to sleep on and off. Patient denies HI/AVH. Endorses passive SI but verbally contracts for safety. Remains safe on level III obs. Will continue to monitor throughout the night.

## 2018-08-13 LAB — INFLUENZA PANEL BY PCR (TYPE A & B)
Influenza A By PCR: NEGATIVE
Influenza B By PCR: NEGATIVE

## 2018-08-13 LAB — HEPATITIS PANEL, ACUTE
Hep A IgM: NEGATIVE
Hep B C IgM: NEGATIVE
Hepatitis B Surface Ag: NEGATIVE

## 2018-08-13 NOTE — BHH Group Notes (Signed)
[facility] Mental [facility] Association Group Therapy 08/13/2018 12:36 PM  Type of Therapy: Mental Health Association Presentation  Participation Level: Did not attend, patient on droplet precautions  Modes of Intervention: Discussion, Education and Socialization   Summary of Progress/Problems: [place] ([place]) Speaker came to talk about his personal journey with mental health. The pt processed ways by which to relate to the speaker. [place] speaker provided handouts and educational information pertaining to groups and services offered by the [facility].   Stephanie Acre, MSW, [facility] 08/13/2018 12:36 PM

## 2018-08-13 NOTE — Progress Notes (Signed)
Pt is on unit restriction due to positive for flu.  Droplet precautions are in place.  Pt is high fall risk.  Pt c/o of r ear pain.  Pt c/o inability to sleep.  Pt sts he has withdrawal symptoms Pt sts passive SI but verbally contracts for safety.  Pt is choking risk d/t throat CA and order is in place for pureed diet.   Pt given meds and prn medications for sleep.  Pt given medication information on S/E of medications.  Pt instructed to wear facemask at all times and to wash hands frequently. Pt in room.  Pt remains safe on unit

## 2018-08-13 NOTE — Progress Notes (Signed)
D: Patient states, "I feel better today.  I'm not getting enough food."  Patient states he has been getting double portions of food, and he became angry this morning when he received a single tray.  Patient need assistance filling out his self inventory sheet.  He rates his depression, hopelessness, and anxiety as a 10.  He continues to report suicidal ideation "almost all of the time."  He is currently on contact precautions due to testing positive for the flu.  His symptoms have improved and he has been afebrile since his admission.  He complains of an inner ear infection and is taking an antibiotic.  Patient is wearing a face mask while out in the milieu.  He only comes out to use the phone.  Spoke with providers about discontinuing the precaution since patient is asymptomatic.  He also is trying to get into long term treatment.  Clonidine held this morning due to low BP.  Patient's appetite has improved and he states, "I'm starving."  Provided patient with a couple of snacks.  A: Continue to monitor medication management and MD orders.  Safety checks completed every 15 minutes per protocol.  Offer support and encouragement as needed.  R: Patient is receptive to staff; his behavior is appropriate.

## 2018-08-13 NOTE — Progress Notes (Signed)
The patient shared with the group that he was pleased to be allowed out of his bedroom (no longer physically ill). His goal for tomorrow is to "do what I need to do" and begin his recovery.

## 2018-08-13 NOTE — Progress Notes (Addendum)
[facility] MD Progress Note  08/13/2018 1:46 PM YURI FANA  MRN:  [id number] Subjective:  "I'm a little better."  Mr. Geralds found resting in bed. Presents with flat affect, minimal speech. Reports some improvement in physical symptoms today. Continues to report generalized muscle aches, SOB on exertion but both improving. Hx of COPD. Reports one episode of vomiting last night. Reports increased appetite, states he had not eaten for several days prior to admission. Continues to report depression but denies SI. Expresses desire for rehab and states understanding he will need to be cleared of flu prior to rehab admission.   Per admission H&P: Patient is a 53 year old male with a past psychiatric history significant for polysubstance dependence, COPD, recent positive test for influenza who presented to the [facility] emergency department on 08/10/2026 with suicidal ideation. The patient reportedly sent a text message to his daughter telling her that he wanted to kill himself with a gun, and also told his daughter that he wanted to overdose on opiates and did not want to have Narcan given to him. Patient has had multiple emergency room visits because of unintentional overdose of opiates in the past. He also had presented on 1/17 with a COPD exacerbation, and had been discharged. His influenza B was diagnosed on 08/01/2018. He denied any previous admissions for detox, he denied any previous admissions for residential substance abuse treatment. He is essentially homeless. He also has a past medical history of throat cancer in 2014 where he received radiation. On admission his laboratories revealed a potassium of 3.0, a creatinine of 1.34, a lipase of 77, and a total protein of 5.3. His white blood cell count was 3.6. He is mildly anemic with a hemoglobin and hematocrit of 11.9 and 36.2 respectively. Her urine creatinine was obtained which was 132.2. Urine sodium was obtained that was 59.  His blood alcohol on admission was less than 10. He did have a positive fecal occult blood. His drug screen was positive for opiates and cocaine. He was admitted to the hospital for evaluation and stabilization.  Principal Problem: Cocaine abuse with cocaine-induced mood disorder (HCC) Diagnosis: Principal Problem:   Cocaine abuse with cocaine-induced mood disorder ([place])  Total Time spent with patient: 15 minutes  Past Psychiatric History: See admission H&P  Past Medical History:  Past Medical History:  Diagnosis Date  . Headache(784.0)   . Heart murmur    child  . Hx of radiation therapy 05/19/13- 06/27/13   glottic larynx, right true vocal cord 6300 cGy 28 sessions  . Hypothyroidism   . Polysubstance abuse ([place])   . Shortness of breath   . 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 BALLOON DILATION N/A 12/30/2013   Procedure: BALLOON DILATION;  Surgeon: Garlan Fair, MD;  Location: [facility];  Service: Endoscopy;  Laterality: N/A;  . DIRECT LARYNGOSCOPY Right 04/24/2013   Procedure: DIRECT LARYNGOSCOPY and BIOPSY OF TONGUE;  Surgeon: Jerrell Belfast, MD;  Location: [facility];  Service: ENT;  Laterality: Right;  . ESOPHAGEAL MANOMETRY N/A 09/07/2014   Procedure: ESOPHAGEAL MANOMETRY (EM);  Surgeon: Garlan Fair, MD;  Location: [facility];  Service: Endoscopy;  Laterality: N/A;  . ESOPHAGOGASTRODUODENOSCOPY (EGD) WITH PROPOFOL N/A 12/30/2013   Procedure: ESOPHAGOGASTRODUODENOSCOPY (EGD) WITH PROPOFOL;  Surgeon: Garlan Fair, MD;  Location: [facility];  Service: Endoscopy;  Laterality: N/A;  . MICROLARYNGOSCOPY Right 04/24/2013  Procedure: MICROLARYNGOSCOPY and RIGHT VOCAL CORD BIOPSY;  Surgeon: Jerrell Belfast, MD;  Location: [facility];  Service: ENT;  Laterality: Right;  . MULTIPLE EXTRACTIONS WITH ALVEOLOPLASTY N/A 09/30/2013   Procedure: Extraction of tooth #'s  2,3,4,5,6,7,8,9,10,11,12,15,22,23,24,25,26,27,28 with alveoloplasty;  Surgeon: Lenn Cal, DDS;  Location: [facility];  Service: Oral Surgery;  Laterality: N/A;  . TONSILLECTOMY     as a child  . wisdon teeth     age 60   Family History:  Family History  Problem Relation Age of Onset  . COPD Mother   . Emphysema Mother   . Heart attack Father    Family Psychiatric  History: See admission H&P Social History:  Social History   Substance and Sexual Activity  Alcohol Use No  . Alcohol/week: 0.0 standard drinks     Social History   Substance and Sexual Activity  Drug Use Yes  . Types: Marijuana, Cocaine, Heroin   Comment: Heroin    Social History   Socioeconomic History  . Marital status: Legally Separated    Spouse name: Not on file  . Number of children: 3  . Years of education: Not on file  . 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 Financial resource strain: Not on file  . Food insecurity:    Worry: Not on file    Inability: Not on file  . Transportation needs:    Medical: Not on file    Non-medical: Not on file  Tobacco Use  . Smoking status: Current Every Day Smoker    Packs/day: 1.00    Years: 34.00    Pack years: 34.00    Types: Cigarettes  . Smokeless tobacco: Never Used  . Tobacco comment: DOWN TO 4 - 5 CIGARETTES A DAY  Substance and Sexual Activity  . Alcohol use: No    Alcohol/week: 0.0 standard drinks  . Drug use: Yes    Types: Marijuana, Cocaine, Heroin    Comment: Heroin  . Sexual activity: Yes    Birth control/protection: Condom  Lifestyle  . Physical activity:    Days per week: Not on file    Minutes per session: Not on file  . Stress: Not on file  Relationships  . Social connections:    Talks on phone: Not on file    Gets together: Not on file    Attends religious service: Not on file    Active member of club or organization: Not on file     Attends meetings of clubs or organizations: Not on file    Relationship status: Not on file  Other Topics Concern  . Not on file  Social History Narrative   The patient is married. Patient has 3 children. 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   The patient has a history of smoking 2 packs of cigarette a day for 34 years. Patient current is trying to quit smoking. Patient denies use of alcohol. Patient denies use of other illicit drugs.   Additional Social History:                         Sleep: Good  Appetite:  Good  Current Medications: Current Facility-Administered Medications  Medication Dose Route Frequency Provider Last Rate Last Dose  . acetaminophen (TYLENOL) tablet 650 mg  650 mg Oral Q6H PRN Ethelene Hal, NP  650 mg at 08/12/18 1609  . albuterol (PROVENTIL HFA;VENTOLIN HFA) 108 (90 Base) MCG/ACT inhaler 2 puff  2 puff Inhalation Q4H PRN Lindon Romp A, NP   2 puff at 08/12/18 1253  . alum & mag hydroxide-simeth (MAALOX/MYLANTA) 200-200-20 MG/5ML suspension 30 mL  30 mL Oral Q4H PRN Ethelene Hal, NP      . amoxicillin-clavulanate (AUGMENTIN) 875-125 MG per tablet 1 tablet  1 tablet Oral Q12H Ethelene Hal, NP   1 tablet at 08/13/18 [phone]  . cloNIDine (CATAPRES) tablet 0.1 mg  0.1 mg Oral BH-qamhs [name], NP       Followed by  . [START ON 08/16/2018] cloNIDine (CATAPRES) tablet 0.1 mg  0.1 mg Oral QAC breakfast Lindon Romp A, NP      . dicyclomine (BENTYL) tablet 20 mg  20 mg Oral Q6H PRN [name], NP      . DULoxetine (CYMBALTA) DR capsule 30 mg  30 mg Oral Daily Ethelene Hal, NP   30 mg at 08/13/18 1700  . gabapentin (NEURONTIN) capsule 300 mg  300 mg Oral BID Ethelene Hal, NP   300 mg at 08/13/18 1749  . hydrOXYzine (ATARAX/VISTARIL) tablet 25 mg  25 mg Oral Q6H PRN Ethelene Hal, NP   25 mg at 08/12/18 1609  . ipratropium-albuterol (DUONEB) 0.5-2.5 (3) MG/3ML nebulizer solution 3 mL  3 mL  Nebulization Q6H PRN Lindon Romp A, NP   3 mL at 08/11/18 2223  . levothyroxine (SYNTHROID, LEVOTHROID) tablet 175 mcg  175 mcg Oral QAC breakfast Ethelene Hal, NP   175 mcg at 08/13/18 0535  . magnesium hydroxide (MILK OF MAGNESIA) suspension 30 mL  30 mL Oral Daily PRN Ethelene Hal, NP      . methocarbamol (ROBAXIN) tablet 500 mg  500 mg Oral Q8H PRN Lindon Romp A, NP   500 mg at 08/12/18 1609  . nicotine (NICODERM CQ - dosed in mg/24 hours) patch 21 mg  21 mg Transdermal Daily [name], MD   21 mg at 08/13/18 0753  . ondansetron (ZOFRAN-ODT) disintegrating tablet 4 mg  4 mg Oral Q6H PRN Lindon Romp A, NP   4 mg at 08/11/18 0053  . pantoprazole (PROTONIX) EC tablet 40 mg  40 mg Oral Daily [name], MD   40 mg at 08/13/18 4496  . traZODone (DESYREL) tablet 50 mg  50 mg Oral QHS PRN Ethelene Hal, NP   50 mg at 08/12/18 2259    Lab Results:  Results for orders placed or performed during the hospital encounter of 08/10/18 (from the past 48 hour(s))  CBC with Differential/Platelet     Status: Abnormal   Collection Time: 08/11/18  6:45 PM  Result Value Ref Range   WBC 4.7 4.0 - 10.5 K/uL   RBC 3.58 (L) 4.22 - 5.81 MIL/uL   Hemoglobin 11.5 (L) 13.0 - 17.0 g/dL   HCT 34.7 (L) 39.0 - 52.0 %   MCV 96.9 80.0 - 100.0 fL   MCH 32.1 26.0 - 34.0 pg   MCHC 33.1 30.0 - 36.0 g/dL   RDW 13.1 11.5 - 15.5 %   Platelets 275 150 - 400 K/uL   nRBC 0.0 0.0 - 0.2 %   Neutrophils Relative % 60 %   Neutro Abs 2.8 1.7 - 7.7 K/uL   Lymphocytes Relative 29 %   Lymphs Abs 1.4 0.7 - 4.0 K/uL   Monocytes Relative 6 %   Monocytes  Absolute 0.3 0.1 - 1.0 K/uL   Eosinophils Relative 4 %   Eosinophils Absolute 0.2 0.0 - 0.5 K/uL   Basophils Relative 0 %   Basophils Absolute 0.0 0.0 - 0.1 K/uL   Immature Granulocytes 1 %   Abs Immature Granulocytes 0.03 0.00 - 0.07 K/uL    Comment: Performed at [facility], [place] [address] [place], [place]  [address]  Vitamin B12     Status: Abnormal   Collection Time: 08/11/18  6:45 PM  Result Value Ref Range   Vitamin B-12 157 (L) 180 - 914 pg/mL    Comment: (NOTE) This assay is not validated for testing neonatal or myeloproliferative syndrome specimens for Vitamin B12 levels. Performed at [facility], [place] [address], [place], [place] [address]   Folate     Status: None   Collection Time: 08/11/18  6:45 PM  Result Value Ref Range   Folate 8.0 >5.9 ng/mL    Comment: Performed at [facility], [place] [address] [place], [place] [address]  Iron and TIBC     Status: Abnormal   Collection Time: 08/11/18  6:45 PM  Result Value Ref Range   Iron 57 45 - 182 ug/dL   TIBC 235 (L) 250 - 450 ug/dL   Saturation Ratios 24 17.9 - 39.5 %   UIBC 178 ug/dL    Comment: Performed at [facility], [place] [address], [place], [place] [address]  Ferritin     Status: None   Collection Time: 08/11/18  6:45 PM  Result Value Ref Range   Ferritin 148 24 - 336 ng/mL    Comment: Performed at [facility], [place] [address], [place], [place] [address]  Reticulocytes     Status: Abnormal   Collection Time: 08/11/18  6:45 PM  Result Value Ref Range   Retic Ct Pct 0.9 0.4 - 3.1 %   RBC. 3.58 (L) 4.22 - 5.81 MIL/uL   Retic Count, Absolute 30.4 19.0 - 186.0 K/uL   Immature Retic Fract 11.1 2.3 - 15.9 %    Comment: Performed at [facility], [place] [address], [place], [place] [address]  Hepatitis panel, acute     Status: None   Collection Time: 08/11/18  6:45 PM  Result Value Ref Range   Hepatitis B Surface Ag Negative Negative   HCV Ab <0.1 0.0 - 0.9 s/co ratio    Comment: (NOTE)                                  Negative:     < 0.8                             Indeterminate: 0.8 - 0.9                                  Positive:     > 0.9 The CDC recommends that a positive HCV antibody result be followed up with a HCV Nucleic  Acid Amplification test ([id number]). Performed At: [facility] [place], [place] [id number] Rush Farmer MD PY:[id number]    Hep A IgM Negative Negative   Hep B C IgM Negative Negative    Blood Alcohol level:  Lab Results  Component Value Date  ETH <10 08/09/2018   ETH <10 64/40/3474    Metabolic Disorder Labs: Lab Results  Component Value Date   HGBA1C 6.9 (H) 08/11/2018   MPG 151.33 08/11/2018   No results found for: PROLACTIN Lab Results  Component Value Date   CHOL 213 (H) 08/11/2018   TRIG 122 08/11/2018   HDL 46 08/11/2018   CHOLHDL 4.6 08/11/2018   VLDL 24 08/11/2018   LDLCALC 143 (H) 08/11/2018   LDLCALC 82 07/09/2014    Physical Findings: AIMS: Facial and Oral Movements Muscles of Facial Expression: None, normal Lips and Perioral Area: None, normal Jaw: None, normal Tongue: None, normal,Extremity Movements Upper (arms, wrists, hands, fingers): None, normal Lower (legs, knees, ankles, toes): None, normal, Trunk Movements Neck, shoulders, hips: None, normal, Overall Severity Severity of abnormal movements (highest score from questions above): None, normal Incapacitation due to abnormal movements: None, normal Patient's awareness of abnormal movements (rate only patient's report): No Awareness, Dental Status Current problems with teeth and/or dentures?: No Does patient usually wear dentures?: No  CIWA:    COWS:  COWS Total Score: 6  Musculoskeletal: Strength & Muscle Tone: within normal limits Gait & Station: normal Patient leans: N/A  Psychiatric Specialty Exam: Physical Exam  Nursing note and vitals reviewed. Constitutional: He is oriented to person, place, and time. He appears well-developed.  Cardiovascular: Normal rate.  Respiratory: Effort normal.  Neurological: He is alert and oriented to person, place, and time.    Review of Systems  Constitutional: Negative.   Respiratory: Positive for shortness of breath (on  exertion).   Cardiovascular: Negative.   Gastrointestinal: Positive for nausea and vomiting.  Musculoskeletal: Positive for myalgias.  Psychiatric/Behavioral: Positive for depression (improving) and substance abuse (hx cocaine, opioids, THC). Negative for hallucinations, memory loss and suicidal ideas. The patient is not nervous/anxious and does not have insomnia.     Blood pressure 116/82, pulse 63, temperature 98.1 F (36.7 C), temperature source Oral, resp. rate 18, height 5\' 11"  (1.803 m), weight 59 kg, SpO2 100 %.Body mass index is 18.13 kg/m.  General Appearance: Fairly Groomed  Eye Contact:  Fair  Speech:  Clear and Coherent  Volume:  Normal  Mood:  Depressed  Affect:  Congruent  Thought Process:  Coherent  Orientation:  Full (Time, Place, and Person)  Thought Content:  WDL  Suicidal Thoughts:  No  Homicidal Thoughts:  No  Memory:  Immediate;   Good  Judgement:  Fair  Insight:  Fair  Psychomotor Activity:  Normal  Concentration:  Concentration: Good  Recall:  [place] of Knowledge:  Fair  Language:  Good  Akathisia:  No  Handed:  Right  AIMS (if indicated):     Assets:  Communication Skills Desire for Improvement Resilience  ADL's:  Intact  Cognition:  WNL  Sleep:  Number of Hours: 5.75     Treatment Plan Summary: Daily contact with patient to assess and evaluate symptoms and progress in treatment and Medication management   Continue inpatient hospitalization.  Continue Augmentin 875-125 mg PO Q12HR for tooth infection Continue Duoneb nebulizer Q6HR PRN wheezing, SOB Continue clonidine COWS protocol for opioid detox Continue Cymbalta 30 mg PO daily for mood Continue gabapentin 300 mg PO BID for anxiety/agitation Continue Vistaril 25 mg PO Q6HR PRN anxiety Continue trazodone 50 mg PO QHS PRN insomnia Continue Synthroid 175 mcg PO daily for hypothyroidism Recheck influenza swab  Patient will participate in the therapeutic group milieu.  Discharge  disposition in progress.   Loura Pardon  Jenne [facility], NP 08/13/2018, 1:46 PM   Agree with NP progress note

## 2018-08-14 DIAGNOSIS — F332 Major depressive disorder, recurrent severe without psychotic features: Secondary | ICD-10-CM

## 2018-08-14 LAB — HIV-1 RNA, QUALITATIVE, TMA: HIV-1 RNA, Qualitative, TMA: NEGATIVE

## 2018-08-14 MED ORDER — DULOXETINE HCL 30 MG PO CPEP
30.0000 mg | ORAL_CAPSULE | Freq: Two times a day (BID) | ORAL | Status: DC
  Administered 2018-08-14 – 2018-08-15 (×3): 30 mg via ORAL
  Filled 2018-08-14 (×6): qty 1

## 2018-08-14 MED ORDER — TRAZODONE HCL 100 MG PO TABS
100.0000 mg | ORAL_TABLET | Freq: Every evening | ORAL | Status: DC | PRN
  Administered 2018-08-14: 100 mg via ORAL
  Filled 2018-08-14 (×2): qty 1

## 2018-08-14 NOTE — Progress Notes (Signed)
Patient ID: [name], male   DOB: [birth year], 53 y.o.   MRN: [id number]  Pt currently presents with a worried affect and pleasant behavior. Pt reports to Probation officer ongoing ear pain. Pt states "my head just feels full." Pt reports fair sleep with current medication regimen. Pt takes medications with pudding and tolerates it well.   Pt provided with medications per providers orders. Pt's labs and vitals were monitored throughout the night. Pt supported emotionally and encouraged to express concerns and questions. Pt educated on medications and proper use of breathing treatment.   Pt's safety ensured with 15 minute and environmental checks. Pt endorses ongoing passive SI. Pt currently denies HI and A/V hallucinations. Pt verbally agrees to seek staff if SI worsens, HI or A/VH occurs and to consult with staff before acting on any harmful thoughts. Will continue POC.

## 2018-08-14 NOTE — Progress Notes (Signed)
CSW contacted Amy in admissions at [facility]. Currently, there are no beds available and there is a waitlist. CSW was instructed to call back Thursday 1/23 morning to check status of referral and waitlist.   Esau Grew. Ouida Sills, MSW, LCSW Clinical Social Worker 08/14/2018 11:12 AM

## 2018-08-14 NOTE — Progress Notes (Addendum)
[facility] MD Progress Note  08/14/2018 1:48 PM [name]  MRN:  [id number] Subjective:  "I want to die if I have to go back to the street."  [name] found participating in group therapy. Influenza test negative yesterday afternoon. Reports improvement in flulike symptoms. He continues to report headache with some ear pain. Per chart review he was started on Augmentin in ED on 1/14 for ear infection. Patient reports he did not take the Augmentin after leaving the ED. He was started on Augmentin here 1/18.  He continues to report depression, with suicidal ideation no plan. Denies any suicidal plan or intent and contracts for safety on the unit. Reports low energy, anhedonia, racing thoughts and difficulty sleeping. Denies medication side effects.  Per admission H&P:Patient is a 53 year old male with a past psychiatric history significant for polysubstance dependence, COPD, recent positive test for influenza who presented to the [facility] emergency department on 08/10/2026 with suicidal ideation. The patient reportedly sent a text message to his daughter telling her that he wanted to kill himself with a gun, and also told his daughter that he wanted to overdose on opiates and did not want to have Narcan given to him. Patient has had multiple emergency room visits because of unintentional overdose of opiates in the past. He also had presented on 1/17 with a COPD exacerbation, and had been discharged. His influenza B was diagnosed on 08/01/2018. He denied any previous admissions for detox, he denied any previous admissions for residential substance abuse treatment. He is essentially homeless. He also has a past medical history of throat cancer in 2014 where he received radiation. On admission his laboratories revealed a potassium of 3.0, a creatinine of 1.34, a lipase of 77, and a total protein of 5.3. His white blood cell count was 3.6. He is mildly anemic with a hemoglobin and  hematocrit of 11.9 and 36.2 respectively. Her urine creatinine was obtained which was 132.2. Urine sodium was obtained that was 59. His blood alcohol on admission was less than 10. He did have a positive fecal occult blood. His drug screen was positive for opiates and cocaine. He was admitted to the hospital for evaluation and stabilization.  Principal Problem: Cocaine abuse with cocaine-induced mood disorder (HCC) Diagnosis: Principal Problem:   Cocaine abuse with cocaine-induced mood disorder ([place])  Total Time spent with patient: 15 minutes   Past Psychiatric History: See admission H&P  Past Medical History:  Past Medical History:  Diagnosis Date  . Headache(784.0)   . Heart murmur    child  . Hx of radiation therapy 05/19/13- 06/27/13   glottic larynx, right true vocal cord 6300 cGy 28 sessions  . Hypothyroidism   . Polysubstance abuse ([place])   . Shortness of breath   . 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 BALLOON DILATION N/A 12/30/2013   Procedure: BALLOON DILATION;  Surgeon: Garlan Fair, MD;  Location: [facility];  Service: Endoscopy;  Laterality: N/A;  . DIRECT LARYNGOSCOPY Right 04/24/2013   Procedure: DIRECT LARYNGOSCOPY and BIOPSY OF TONGUE;  Surgeon: Jerrell Belfast, MD;  Location: [facility];  Service: ENT;  Laterality: Right;  . ESOPHAGEAL MANOMETRY N/A 09/07/2014   Procedure: ESOPHAGEAL MANOMETRY (EM);  Surgeon: Garlan Fair, MD;  Location: [facility];  Service: Endoscopy;  Laterality: N/A;  . ESOPHAGOGASTRODUODENOSCOPY (EGD) WITH PROPOFOL N/A 12/30/2013   Procedure: ESOPHAGOGASTRODUODENOSCOPY (  EGD) WITH PROPOFOL;  Surgeon: Garlan Fair, MD;  Location: [facility];  Service: Endoscopy;  Laterality: N/A;  . MICROLARYNGOSCOPY Right 04/24/2013   Procedure: MICROLARYNGOSCOPY and RIGHT VOCAL CORD BIOPSY;  Surgeon: Jerrell Belfast, MD;  Location: [facility];   Service: ENT;  Laterality: Right;  . MULTIPLE EXTRACTIONS WITH ALVEOLOPLASTY N/A 09/30/2013   Procedure: Extraction of tooth #'s 2,3,4,5,6,7,8,9,10,11,12,15,22,23,24,25,26,27,28 with alveoloplasty;  Surgeon: Lenn Cal, DDS;  Location: [facility];  Service: Oral Surgery;  Laterality: N/A;  . TONSILLECTOMY     as a child  . wisdon teeth     age 22   Family History:  Family History  Problem Relation Age of Onset  . COPD Mother   . Emphysema Mother   . Heart attack Father    Family Psychiatric  History: See admission H&P Social History:  Social History   Substance and Sexual Activity  Alcohol Use No  . Alcohol/week: 0.0 standard drinks     Social History   Substance and Sexual Activity  Drug Use Yes  . Types: Marijuana, Cocaine, Heroin   Comment: Heroin    Social History   Socioeconomic History  . Marital status: Legally Separated    Spouse name: Not on file  . Number of children: 3  . Years of education: Not on file  . 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 Financial resource strain: Not on file  . Food insecurity:    Worry: Not on file    Inability: Not on file  . Transportation needs:    Medical: Not on file    Non-medical: Not on file  Tobacco Use  . Smoking status: Current Every Day Smoker    Packs/day: 1.00    Years: 34.00    Pack years: 34.00    Types: Cigarettes  . Smokeless tobacco: Never Used  . Tobacco comment: DOWN TO 4 - 5 CIGARETTES A DAY  Substance and Sexual Activity  . Alcohol use: No    Alcohol/week: 0.0 standard drinks  . Drug use: Yes    Types: Marijuana, Cocaine, Heroin    Comment: Heroin  . Sexual activity: Yes    Birth control/protection: Condom  Lifestyle  . Physical activity:    Days per week: Not on file    Minutes per session: Not on file  . Stress: Not on file  Relationships  . Social connections:    Talks on phone: Not on file     Gets together: Not on file    Attends religious service: Not on file    Active member of club or organization: Not on file    Attends meetings of clubs or organizations: Not on file    Relationship status: Not on file  Other Topics Concern  . Not on file  Social History Narrative   The patient is married. Patient has 3 children. 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   The patient has a history of smoking 2 packs of cigarette a day for 34 years. Patient current is trying to quit smoking. Patient denies use of alcohol. Patient denies use of other illicit drugs.   Additional Social History:                         Sleep: Poor  Appetite:  Good  Current Medications: Current Facility-Administered Medications  Medication Dose  Route Frequency Provider Last Rate Last Dose  . acetaminophen (TYLENOL) tablet 650 mg  650 mg Oral Q6H PRN Ethelene Hal, NP   650 mg at 08/14/18 1006  . albuterol (PROVENTIL HFA;VENTOLIN HFA) 108 (90 Base) MCG/ACT inhaler 2 puff  2 puff Inhalation Q4H PRN Lindon Romp A, NP   2 puff at 08/14/18 [phone]  . alum & mag hydroxide-simeth (MAALOX/MYLANTA) 200-200-20 MG/5ML suspension 30 mL  30 mL Oral Q4H PRN Ethelene Hal, NP      . amoxicillin-clavulanate (AUGMENTIN) 875-125 MG per tablet 1 tablet  1 tablet Oral Q12H Ethelene Hal, NP   Stopped at 08/14/18 [phone]  . cloNIDine (CATAPRES) tablet 0.1 mg  0.1 mg Oral BH-qamhs Lindon Romp A, NP   0.1 mg at 08/14/18 0810   Followed by  . [START ON 08/16/2018] cloNIDine (CATAPRES) tablet 0.1 mg  0.1 mg Oral QAC breakfast Lindon Romp A, NP      . dicyclomine (BENTYL) tablet 20 mg  20 mg Oral Q6H PRN [name], NP      . DULoxetine (CYMBALTA) DR capsule 30 mg  30 mg Oral Daily Ethelene Hal, NP   30 mg at 08/14/18 0810  . gabapentin (NEURONTIN) capsule 300 mg  300 mg Oral BID Ethelene Hal, NP   300 mg at 08/14/18 3664  . hydrOXYzine (ATARAX/VISTARIL) tablet 25 mg  25 mg  Oral Q6H PRN Ethelene Hal, NP   25 mg at 08/13/18 2113  . ipratropium-albuterol (DUONEB) 0.5-2.5 (3) MG/3ML nebulizer solution 3 mL  3 mL Nebulization Q6H PRN Lindon Romp A, NP   3 mL at 08/11/18 2223  . levothyroxine (SYNTHROID, LEVOTHROID) tablet 175 mcg  175 mcg Oral QAC breakfast Ethelene Hal, NP   175 mcg at 08/14/18 0630  . magnesium hydroxide (MILK OF MAGNESIA) suspension 30 mL  30 mL Oral Daily PRN Ethelene Hal, NP      . methocarbamol (ROBAXIN) tablet 500 mg  500 mg Oral Q8H PRN Lindon Romp A, NP   500 mg at 08/12/18 1609  . nicotine (NICODERM CQ - dosed in mg/24 hours) patch 21 mg  21 mg Transdermal Daily [name], MD   21 mg at 08/14/18 4034  . ondansetron (ZOFRAN-ODT) disintegrating tablet 4 mg  4 mg Oral Q6H PRN Lindon Romp A, NP   4 mg at 08/11/18 0053  . pantoprazole (PROTONIX) EC tablet 40 mg  40 mg Oral Daily [name], MD   40 mg at 08/14/18 0810  . traZODone (DESYREL) tablet 50 mg  50 mg Oral QHS PRN Ethelene Hal, NP   50 mg at 08/13/18 2113    Lab Results:  Results for orders placed or performed during the hospital encounter of 08/10/18 (from the past 48 hour(s))  Influenza panel by PCR (type A & B)     Status: None   Collection Time: 08/13/18  1:00 PM  Result Value Ref Range   Influenza A By PCR NEGATIVE NEGATIVE   Influenza B By PCR NEGATIVE NEGATIVE    Comment: (NOTE) The Xpert Xpress Flu assay is intended as an aid in the diagnosis of  influenza and should not be used as a sole basis for treatment.  This  assay is FDA approved for nasopharyngeal swab specimens only. Nasal  washings and aspirates are unacceptable for Xpert Xpress Flu testing. Performed at [facility], [place] [address] [place], [place] [address]     Blood  Alcohol level:  Lab Results  Component Value Date   ETH <10 08/09/2018   ETH <10 25/95/6387    Metabolic Disorder Labs: Lab Results  Component Value Date   HGBA1C  6.9 (H) 08/11/2018   MPG 151.33 08/11/2018   No results found for: PROLACTIN Lab Results  Component Value Date   CHOL 213 (H) 08/11/2018   TRIG 122 08/11/2018   HDL 46 08/11/2018   CHOLHDL 4.6 08/11/2018   VLDL 24 08/11/2018   LDLCALC 143 (H) 08/11/2018   LDLCALC 82 07/09/2014    Physical Findings: AIMS: Facial and Oral Movements Muscles of Facial Expression: None, normal Lips and Perioral Area: None, normal Jaw: None, normal Tongue: None, normal,Extremity Movements Upper (arms, wrists, hands, fingers): None, normal Lower (legs, knees, ankles, toes): None, normal, Trunk Movements Neck, shoulders, hips: None, normal, Overall Severity Severity of abnormal movements (highest score from questions above): None, normal Incapacitation due to abnormal movements: None, normal Patient's awareness of abnormal movements (rate only patient's report): No Awareness, Dental Status Current problems with teeth and/or dentures?: No Does patient usually wear dentures?: No  CIWA:    COWS:  COWS Total Score: 2  Musculoskeletal: Strength & Muscle Tone: within normal limits Gait & Station: normal Patient leans: N/A  Psychiatric Specialty Exam: Physical Exam  Nursing note and vitals reviewed. Constitutional: He is oriented to person, place, and time. He appears well-developed.  Cardiovascular: Normal rate.  Respiratory: Effort normal.  Neurological: He is alert and oriented to person, place, and time.    Review of Systems  Constitutional: Negative.   Respiratory: Negative.   Cardiovascular: Negative.   Psychiatric/Behavioral: Positive for depression, substance abuse (cocaine, opioids) and suicidal ideas. Negative for hallucinations and memory loss. The patient is nervous/anxious and has insomnia.     Blood pressure 113/84, pulse 80, temperature 97.8 F (36.6 C), temperature source Oral, resp. rate 18, height 5\' 11"  (1.803 m), weight 59 kg, SpO2 100 %.Body mass index is 18.13 kg/m.   General Appearance: Fairly Groomed  Eye Contact:  Fair  Speech:  Clear and Coherent  Volume:  Normal  Mood:  Depressed  Affect:  Flat  Thought Process:  Coherent  Orientation:  Full (Time, Place, and Person)  Thought Content:  WDL  Suicidal Thoughts:  Yes.  without intent/plan Contracts for safety on the unit  Homicidal Thoughts:  No  Memory:  Immediate;   Fair  Judgement:  Fair  Insight:  Fair  Psychomotor Activity:  Normal  Concentration:  Concentration: Fair  Recall:  [employer] of Knowledge:  Fair  Language:  Good  Akathisia:  No  Handed:  Right  AIMS (if indicated):     Assets:  Communication Skills Desire for Improvement Resilience Social Support  ADL's:  Intact  Cognition:  WNL  Sleep:  Number of Hours: 5.5     Treatment Plan Summary: Daily contact with patient to assess and evaluate symptoms and progress in treatment and Medication management  Continue inpatient hospitalization.  Increase Cymbalta to 30 mg PO BID for mood Increase trazodone to 100 mg PO QHS PRN insomnia Continue clonidine COWS protocol for opioid detox Continue gabapentin 300 mg PO BID for anxiety/agitation Continue Vistaril 25 mg PO Q6HR PRN anxiety Continue Augmentin 875-125 mg PO Q12HR for tooth infection Continue Duoneb nebulizer Q6HR PRN wheezing, SOB Continue Synthroid 175 mcg PO daily for hypothyroidism  Patient will participate in the therapeutic group milieu.  Discharge disposition in progress.   Connye Burkitt, NP 08/14/2018, 1:48  PM   Agree with NP progress note

## 2018-08-14 NOTE — Progress Notes (Signed)
D   Pt is pleasant on approach and cooperative    He interacts appropriately but minimally with others   His behavior is appropriate     He reports he is glad to be able to come out of his room now A    Verbal support given   Medications administered and effectiveness monitored    Q 15 min checks  R   Pt is safe at this time

## 2018-08-14 NOTE — Progress Notes (Signed)
Recreation Therapy Notes  Date:  1.22.20 Time: 0930 Location: [address] Dayroom  Group Topic: Stress Management  Goal Area(s) Addresses:  Patient will identify positive stress management techniques. Patient will identify benefits of using stress management post d/c.  Intervention:  Stress Management  Activity :  Meditation.  LRT introduced the stress management technique of meditation.  LRT played a meditation that dealt positive possibilities which walked patients through looking at each day as a new beginning.  Patients were to follow along as the meditation played to engage in the activity.  Education:  Stress Management, Discharge Planning.   Education Outcome: Acknowledges Education  Clinical Observations/Feedback: Pt did not attend group.    [name], LRT/CTRS         [name] A 08/14/2018 11:40 AM

## 2018-08-15 DIAGNOSIS — F332 Major depressive disorder, recurrent severe without psychotic features: Secondary | ICD-10-CM

## 2018-08-15 LAB — GLUCOSE, CAPILLARY: GLUCOSE-CAPILLARY: 121 mg/dL — AB (ref 70–99)

## 2018-08-15 MED ORDER — TRAZODONE HCL 150 MG PO TABS
150.0000 mg | ORAL_TABLET | Freq: Every evening | ORAL | Status: DC | PRN
  Administered 2018-08-15 – 2018-08-19 (×5): 150 mg via ORAL
  Filled 2018-08-15 (×4): qty 1
  Filled 2018-08-15: qty 16
  Filled 2018-08-15: qty 1

## 2018-08-15 MED ORDER — GABAPENTIN 100 MG PO CAPS
100.0000 mg | ORAL_CAPSULE | Freq: Two times a day (BID) | ORAL | Status: DC
  Administered 2018-08-16 – 2018-08-18 (×5): 100 mg via ORAL
  Filled 2018-08-15 (×10): qty 1

## 2018-08-15 MED ORDER — DULOXETINE HCL 20 MG PO CPEP
40.0000 mg | ORAL_CAPSULE | Freq: Every day | ORAL | Status: DC
  Administered 2018-08-16 – 2018-08-17 (×2): 40 mg via ORAL
  Filled 2018-08-15 (×5): qty 2

## 2018-08-15 MED ORDER — HYDROXYZINE HCL 25 MG PO TABS
25.0000 mg | ORAL_TABLET | Freq: Four times a day (QID) | ORAL | Status: DC | PRN
  Administered 2018-08-16 – 2018-08-17 (×2): 25 mg via ORAL
  Filled 2018-08-15 (×3): qty 1

## 2018-08-15 NOTE — [facility] Group Notes (Signed)
LCSW Group Therapy Note  08/15/2018 2:31 PM  Type of Therapy/Topic:  Group Therapy:  Balance in Life  Participation Level:  Minimal  Description of Group:     This group will address the concept of balance and how it feels and looks when one is unbalanced. Patients will be encouraged to process areas in their lives that are out of balance and identify reasons for remaining unbalanced. Facilitators will guide patients in utilizing problem-solving interventions to address and correct the stressor making their life unbalanced. Understanding and applying boundaries will be explored and addressed for obtaining and maintaining a balanced life. Patients will be encouraged to explore ways to assertively make their unbalanced needs known to significant others in their lives, using other group members and facilitator for support and feedback.  Therapeutic Goals:  1.     Patient will identify two or more emotions or situations they have that consume much of in their lives. 2.     Patient will identify signs/triggers that life has become out of balance: 3.     Patient will identify two ways to set boundaries in order to achieve balance in their lives: 4.     Patient will demonstrate ability to communicate their needs through discussion and/or role plays  Summary of Patient Progress: [name] attended the entire session. He was engaged with the topic when prompted to respond. [name] named 3 areas that were out of balance in his life. He stated that "treatment would be one step in the right direction".     Therapeutic Modalities:   Cognitive Behavioral Therapy Solution-Focused Therapy Assertiveness Training  Lawana Pai, MSW [facility] [place] Department Phone: [phone] Fax: [phone]

## 2018-08-15 NOTE — Plan of Care (Addendum)
Patient was pleasant upon approach. Denies SI HI AVH. No complaints about medications. Safety maintained with 15 minute checks. Will continue to monitor and provide support.  Problem: Education: Goal: Emotional status will improve Outcome: Progressing Goal: Mental status will improve Outcome: Progressing Goal: Verbalization of understanding the information provided will improve Outcome: Progressing   Problem: Activity: Goal: Interest or engagement in activities will improve Outcome: Progressing

## 2018-08-15 NOTE — Progress Notes (Addendum)
[facility] MD Progress Note  08/15/2018 4:43 PM [name]  MRN:  [id number] Subjective:   Patient reports he feels " dizzy", " weak",  " dry mouth" Reports his mood has improved partially compared to admission. Denies current suicidal ideations.  Objective: I have discussed case with treatment team and have met with patient. 53 year old male, homeless, history of opiate dependence. History of COPD, Hypothyroidism and of  pharyngeal cancer in 2014.  Recently diagnosed with Influenza B ( now cleared, currently afebrile, temperature 97.7) He presented to hospital due to depression, suicidal ideations.   At this time patient reports lingering depression and states he continues to feel " weak".  Denies current suicidal ideations, but continues to feel depressed, which he attributes to poor social support system and homelessness . Endorses some lingering anhedonia, poor sleep, but states his appetite is much improved and that he has been eating " a lot better". He is currently planning to go to a rehab setting at discharge [facility])  Patient no longer febrile, does report that following influenza he has had R ear pain, discomfort - he is currently on  Augmentin .  More visible on unit, has been going to some groups. Denies medication side effects other than feeling dizzy.    Principal Problem: Cocaine abuse with cocaine-induced mood disorder (HCC) Diagnosis: Principal Problem:   Cocaine abuse with cocaine-induced mood disorder (HCC) Active Problems:   Major depressive disorder, recurrent episode, severe ([place])  Total Time spent with patient: 20 minutes  Past Psychiatric History: See admission H&P  Past Medical History:  Past Medical History:  Diagnosis Date  . Headache(784.0)   . Heart murmur    child  . Hx of radiation therapy 05/19/13- 06/27/13   glottic larynx, right true vocal cord 6300 cGy 28 sessions  . Hypothyroidism   . Polysubstance abuse ([place])   . Shortness of breath    . 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 Vocal cord cancer ([employer]) 05/01/2013    Past Surgical History:  Procedure Laterality Date  . BALLOON DILATION N/A 12/30/2013   Procedure: BALLOON DILATION;  Surgeon: Garlan Fair, MD;  Location: [facility];  Service: Endoscopy;  Laterality: N/A;  . DIRECT LARYNGOSCOPY Right 04/24/2013   Procedure: DIRECT LARYNGOSCOPY and BIOPSY OF TONGUE;  Surgeon: Jerrell Belfast, MD;  Location: [facility];  Service: ENT;  Laterality: Right;  . ESOPHAGEAL MANOMETRY N/A 09/07/2014   Procedure: ESOPHAGEAL MANOMETRY (EM);  Surgeon: Garlan Fair, MD;  Location: [facility];  Service: Endoscopy;  Laterality: N/A;  . ESOPHAGOGASTRODUODENOSCOPY (EGD) WITH PROPOFOL N/A 12/30/2013   Procedure: ESOPHAGOGASTRODUODENOSCOPY (EGD) WITH PROPOFOL;  Surgeon: Garlan Fair, MD;  Location: [facility];  Service: Endoscopy;  Laterality: N/A;  . MICROLARYNGOSCOPY Right 04/24/2013   Procedure: MICROLARYNGOSCOPY and RIGHT VOCAL CORD BIOPSY;  Surgeon: Jerrell Belfast, MD;  Location: [facility];  Service: ENT;  Laterality: Right;  . MULTIPLE EXTRACTIONS WITH ALVEOLOPLASTY N/A 09/30/2013   Procedure: Extraction of tooth #'s 2,3,4,5,6,7,8,9,10,11,12,15,22,23,24,25,26,27,28 with alveoloplasty;  Surgeon: Lenn Cal, DDS;  Location: [facility];  Service: Oral Surgery;  Laterality: N/A;  . TONSILLECTOMY     as a child  . w[place]on teeth     age 27   Family History:  Family History  Problem Relation Age of Onset  . COPD Mother   . Emphysema Mother   . Heart attack Father    Family Psychiatric  History: See admission H&P Social History:  Social History  Substance and Sexual Activity  Alcohol Use No  . Alcohol/week: 0.0 standard drinks     Social History   Substance and Sexual Activity  Drug Use Yes  . Types: Marijuana, Cocaine, Heroin   Comment: Heroin    Social History   Socioeconomic History  . Marital status: Legally Separated    Spouse name: Not on file  .  Number of children: 3  . Years of education: Not on file  . 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 Financial resource strain: Not on file  . Food insecurity:    Worry: Not on file    Inability: Not on file  . Transportation needs:    Medical: Not on file    Non-medical: Not on file  Tobacco Use  . Smoking status: Current Every Day Smoker    Packs/day: 1.00    Years: 34.00    Pack years: 34.00    Types: Cigarettes  . Smokeless tobacco: Never Used  . Tobacco comment: DOWN TO 4 - 5 CIGARETTES A DAY  Substance and Sexual Activity  . Alcohol use: No    Alcohol/week: 0.0 standard drinks  . Drug use: Yes    Types: Marijuana, Cocaine, Heroin    Comment: Heroin  . Sexual activity: Yes    Birth control/protection: Condom  Lifestyle  . Physical activity:    Days per week: Not on file    Minutes per session: Not on file  . Stress: Not on file  Relationships  . Social connections:    Talks on phone: Not on file    Gets together: Not on file    Attends religious service: Not on file    Active member of club or organization: Not on file    Attends meetings of clubs or organizations: Not on file    Relationship status: Not on file  Other Topics Concern  . Not on file  Social History Narrative   The patient is married. Patient has 3 children. 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   The patient has a history of smoking 2 packs of cigarette a day for 34 years. Patient current is trying to quit smoking. Patient denies use of alcohol. Patient denies use of other illicit drugs.   Additional Social History:   Sleep: Fair  Appetite:  Good  Current Medications: Current Facility-Administered Medications  Medication Dose Route Frequency Provider Last Rate Last Dose  . acetaminophen (TYLENOL) tablet 650 mg  650 mg Oral Q6H PRN Ethelene Hal, NP   650 mg at 08/14/18 1006  .  albuterol (PROVENTIL HFA;VENTOLIN HFA) 108 (90 Base) MCG/ACT inhaler 2 puff  2 puff Inhalation Q4H PRN Lindon Romp A, NP   2 puff at 08/15/18 1347  . alum & mag hydroxide-simeth (MAALOX/MYLANTA) 200-200-20 MG/5ML suspension 30 mL  30 mL Oral Q4H PRN Ethelene Hal, NP      . amoxicillin-clavulanate (AUGMENTIN) 875-125 MG per tablet 1 tablet  1 tablet Oral Q12H Ethelene Hal, NP   1 tablet at 08/15/18 0818  . [START ON 08/16/2018] cloNIDine (CATAPRES) tablet 0.1 mg  0.1 mg Oral QAC breakfast Lindon Romp A, NP      . dicyclomine (BENTYL) tablet 20 mg  20 mg Oral Q6H PRN [name], NP      . DULoxetine (CYMBALTA) DR capsule 30 mg  30 mg Oral  BID Connye Burkitt, NP   30 mg at 08/15/18 4650  . gabapentin (NEURONTIN) capsule 300 mg  300 mg Oral BID Ethelene Hal, NP   300 mg at 08/15/18 3546  . ipratropium-albuterol (DUONEB) 0.5-2.5 (3) MG/3ML nebulizer solution 3 mL  3 mL Nebulization Q6H PRN Lindon Romp A, NP   3 mL at 08/11/18 2223  . levothyroxine (SYNTHROID, LEVOTHROID) tablet 175 mcg  175 mcg Oral QAC breakfast Ethelene Hal, NP   175 mcg at 08/15/18 0818  . magnesium hydroxide (MILK OF MAGNESIA) suspension 30 mL  30 mL Oral Daily PRN Ethelene Hal, NP      . methocarbamol (ROBAXIN) tablet 500 mg  500 mg Oral Q8H PRN Lindon Romp A, NP   500 mg at 08/14/18 2227  . nicotine (NICODERM CQ - dosed in mg/24 hours) patch 21 mg  21 mg Transdermal Daily [name], MD   21 mg at 08/15/18 0817  . ondansetron (ZOFRAN-ODT) disintegrating tablet 4 mg  4 mg Oral Q6H PRN Lindon Romp A, NP   4 mg at 08/11/18 0053  . pantoprazole (PROTONIX) EC tablet 40 mg  40 mg Oral Daily [name], MD   40 mg at 08/15/18 0818  . traZODone (DESYREL) tablet 100 mg  100 mg Oral QHS PRN Connye Burkitt, NP   100 mg at 08/14/18 2134    Lab Results:  No results found for this or any previous visit (from the past 48 hour(s)).  Blood Alcohol level:  Lab Results   Component Value Date   ETH <10 08/09/2018   ETH <10 56/81/2751    Metabolic Disorder Labs: Lab Results  Component Value Date   HGBA1C 6.9 (H) 08/11/2018   MPG 151.33 08/11/2018   No results found for: PROLACTIN Lab Results  Component Value Date   CHOL 213 (H) 08/11/2018   TRIG 122 08/11/2018   HDL 46 08/11/2018   CHOLHDL 4.6 08/11/2018   VLDL 24 08/11/2018   LDLCALC 143 (H) 08/11/2018   LDLCALC 82 07/09/2014    Physical Findings: AIMS: Facial and Oral Movements Muscles of Facial Expression: None, normal Lips and Perioral Area: None, normal Jaw: None, normal Tongue: None, normal,Extremity Movements Upper (arms, wrists, hands, fingers): None, normal Lower (legs, knees, ankles, toes): None, normal, Trunk Movements Neck, shoulders, hips: None, normal, Overall Severity Severity of abnormal movements (highest score from questions above): None, normal Incapacitation due to abnormal movements: None, normal Patient's awareness of abnormal movements (rate only patient's report): No Awareness, Dental Status Current problems with teeth and/or dentures?: No Does patient usually wear dentures?: No  CIWA:    COWS:  COWS Total Score: 1  Musculoskeletal: Strength & Muscle Tone: within normal limits Gait & Station: normal Patient leans: N/A  Psychiatric Specialty Exam: Physical Exam  Nursing note and vitals reviewed. Constitutional: He is oriented to person, place, and time. He appears well-developed.  Cardiovascular: Normal rate.  Respiratory: Effort normal.  Neurological: He is alert and oriented to person, place, and time.    Review of Systems  Constitutional: Negative.   Respiratory: Positive for shortness of breath (on exertion).   Cardiovascular: Negative.   Gastrointestinal: Positive for nausea and vomiting.  Musculoskeletal: Positive for myalgias.  Psychiatric/Behavioral: Positive for depression (improving) and substance abuse (hx cocaine, opioids, THC). Negative  for hallucinations, memory loss and suicidal ideas. The patient is not nervous/anxious and does not have insomnia.   R ear discomfort, some lightheadedness . No chest pain, no  shortness of breath at room air, no vomiting , no fever  Pulse ox at room air today is 98 % Current temperature 98.0  degrees  Blood pressure (!) 125/113, pulse 85, temperature 97.7 F (36.5 C), temperature source Oral, resp. rate 20, height 5' 11"  (1.803 m), weight 59 kg, SpO2 100 %.Body mass index is 18.13 kg/m.  Repeat BP 127/93, pulse 90 CBG done at 5,20 PM - 121  General Appearance: Fairly Groomed  Eye Contact:  Good  Speech:  Normal Rate  Volume:  Normal  Mood:  reports some improvement, but lingering depression  Affect:  vaguely constricted, but smiles at times appropriately   Thought Process:  Linear and Descriptions of Associations: Intact  Orientation:  Other:  fully alert and attentive  Thought Content:  denies hallucinations , no delusions expressed   Suicidal Thoughts:  No denies current suicidal or self injurious ideations, denies homicidal or violent ideations   Homicidal Thoughts:  No  Memory:  recent and remote grossly intact   Judgement:  Other:  improving   Insight:  improving   Psychomotor Activity:  Normal  Concentration:  Concentration: Good and Attention Span: Good  Recall:  Good  Fund of Knowledge:  Good  Language:  Good  Akathisia:  No  Handed:  Right  AIMS (if indicated):     Assets:  Communication Skills Desire for Improvement Resilience  ADL's:  Intact  Cognition:  WNL  Sleep:  Number of Hours: 4   Assessment -  53 year old male, homeless, history of opiate dependence. History of COPD, Hypothyroidism and of  pharyngeal cancer in 2014.  Recently diagnosed with Influenza B ( now cleared, currently afebrile, temperature 97.7) He presented to hospital due to depression, suicidal ideations.   Patient reports some improvement compared to admission, but describes lingering  depression and low energy level . Denies current suicidal ideations. No psychotic symptoms. No current opiate WDL symptoms. Recent Influenza , possible Otitis , now afebrile, breathing comfortably at room air with pulse Ox 98%. At this time  being treated with Augmentin.  Describes some slow improvement in how he is feeling , but reports dizziness, dry mouth, which could  be  Neurontin or Cymbalta associated side effects   Treatment Plan Summary: Daily contact with patient to assess and evaluate symptoms and progress in treatment and Medication management  Treatment plan reviewed as below today 1/23 Encourage group and milieu participation to work on coping skills and symptom reduction Encourage efforts to work on sobriety and relapse prevention  Treatment team working on disposition planning options - as noted, patient interested in going to rehab at D/C Decrease Cymbalta to 40 mgrs QDAY for depression Decrease Neurontin to 100 mgrs BID for anxiety, pain Continue  Augmentin 875-125 mg PO Q12HR for infection/otitis Continue Duoneb nebulizer Q6HR PRN wheezing, SOB Completing  Clonidine taper for opiate detox  Continue Vistaril 25 mg PO Q6HR PRN anxiety Continue Trazodone 100 mg PO QHS PRN insomnia Continue Synthroid 175 mcg PO daily for hypothyroidism Recheck BMP and CBC , platelets/diff in AM  Jenne Campus, MD 08/15/2018, 4:43 PM   Patient ID: [name], male   DOB: Sep 01, 1965, 53 y.o.   MRN: [id number]

## 2018-08-16 LAB — BASIC METABOLIC PANEL
Anion gap: 10 (ref 5–15)
BUN: 19 mg/dL (ref 6–20)
CO2: 26 mmol/L (ref 22–32)
CREATININE: 1.11 mg/dL (ref 0.61–1.24)
Calcium: 8.1 mg/dL — ABNORMAL LOW (ref 8.9–10.3)
Chloride: 108 mmol/L (ref 98–111)
GFR calc Af Amer: >60 mL/min (ref >60)
GFR calc non Af Amer: >60 mL/min (ref >60)
Glucose, Bld: 123 mg/dL — ABNORMAL HIGH (ref 70–99)
Potassium: 3.2 mmol/L — ABNORMAL LOW (ref 3.5–5.1)
Sodium: 144 mmol/L (ref 135–145)

## 2018-08-16 LAB — CBC WITH DIFFERENTIAL/PLATELET
Abs Immature Granulocytes: 0.01 10*3/uL (ref 0.00–0.07)
Basophils Absolute: 0 10*3/uL (ref 0.0–0.1)
Basophils Relative: 1 %
Eosinophils Absolute: 0.2 10*3/uL (ref 0.0–0.5)
Eosinophils Relative: 3 %
HCT: 33.5 % — ABNORMAL LOW (ref 39.0–52.0)
Hemoglobin: 10.7 g/dL — ABNORMAL LOW (ref 13.0–17.0)
Immature Granulocytes: 0 %
Lymphocytes Relative: 31 %
Lymphs Abs: 1.4 10*3/uL (ref 0.7–4.0)
MCH: 31.7 pg (ref 26.0–34.0)
MCHC: 31.9 g/dL (ref 30.0–36.0)
MCV: 99.1 fL (ref 80.0–100.0)
MONO ABS: 0.3 10*3/uL (ref 0.1–1.0)
Monocytes Relative: 7 %
Neutro Abs: 2.6 10*3/uL (ref 1.7–7.7)
Neutrophils Relative %: 58 %
Platelets: 217 10*3/uL (ref 150–400)
RBC: 3.38 MIL/uL — ABNORMAL LOW (ref 4.22–5.81)
RDW: 13.8 % (ref 11.5–15.5)
WBC: 4.4 10*3/uL (ref 4.0–10.5)
nRBC: 0 % (ref 0.0–0.2)

## 2018-08-16 NOTE — Progress Notes (Signed)
Recreation Therapy Notes  Date:  1.24.20 Time: 0930 Location: [address] Dayroom  Group Topic: Stress Management  Goal Area(s) Addresses:  Patient will identify positive stress management techniques. Patient will identify benefits of using stress management post d/c.  Intervention: Stress Management  Activity :  Meditation.  LRT played a meditation that dealt with having choice.  Patients were to listen to the meditation to follow along and engage.  Education:  Stress Management, Discharge Planning.   Education Outcome: Acknowledges Education  Clinical Observations/Feedback:  Pt did not attend group.     [name], LRT/CTRS         [name] A 08/16/2018 11:07 AM

## 2018-08-16 NOTE — Progress Notes (Signed)
Pt attended AA group this evening.  

## 2018-08-16 NOTE — Progress Notes (Signed)
D: Patient observed up and visible in the milieu. Patient states, "I am doing so much better. I think I may leave tomorrow." Patient's affect appropriate, mood pleasant.  Denies pain, physical complaints.   A: Medicated per orders, prn trazadone increased and given for sleep (patient states previous doses at 100mg  have been ineffective). Medication education provided. Level III obs in place for safety. Emotional support offered. Patient encouraged to complete Suicide Safety Plan before discharge. Encouraged to attend and participate in unit programming.  Fall prevention plan in place and reviewed with patient as pt is a high fall risk due to protocol and complaints of weakness. Pitcher of gatorade provided.   R: Patient verbalizes understanding of POC, falls prevention education. On reassess, patient still awake but reports some drowsiness. Patient denies some passive SI but verbally contracts for safety. Denies HI/AVH and remains safe on level III obs. Will continue to monitor throughout the night.

## 2018-08-16 NOTE — [facility] Group Notes (Signed)
LCSW Group Therapy Note  08/16/2018 2:05 PM  Type of Therapy and Topic:  Group Therapy:  Feelings around Relapse and Recovery  Participation Level:  Active   Description of Group:    Patients in this group will discuss emotions they experience before and after a relapse. They will process how experiencing these feelings, or avoidance of experiencing them, relates to having a relapse. Facilitator will guide patients to explore emotions they have related to recovery. Patients will be encouraged to process which emotions are more powerful. They will be guided to discuss the emotional reaction significant others in their lives may have to their relapse or recovery. Patients will be assisted in exploring ways to respond to the emotions of others without this contributing to a relapse.  Therapeutic Goals: 1. Patient will identify two or more emotions that lead to a relapse for them 2. Patient will identify two emotions that result when they relapse 3. Patient will identify two emotions related to recovery 4. Patient will demonstrate ability to communicate their needs through discussion and/or role plays   Summary of Patient Progress: Pt attended group and participated in discussion about resiliency factors and identifying supportive networks to assist with preventing relapse. Pt discussed struggling with supports and having to build a new support network due to "having no family".     Therapeutic Modalities:   Cognitive Behavioral Therapy Solution-Focused Therapy Assertiveness Training Relapse Prevention Therapy   Evalina Field, MSW, LCSW Clinical Social Work 08/16/2018 2:05 PM

## 2018-08-16 NOTE — Progress Notes (Signed)
Patient ID: [name], male   DOB: Aug 06, 1965, 53 y.o.   MRN: [id number]  Pt currently presents with a flat affect and guarded behavior. Pt reports to writer that their goal is to "find somewhere to go." Pt states "thanks for helping me." Endorses ongoing ear pain. Reports difficulty finding transportation. Pt reports poor sleep with current medication regimen. Pt is worried that his cancer has returned.  Pt provided with medications per providers orders. Pt's labs and vitals were monitored throughout the night. Pt supported emotionally and encouraged to express concerns and questions. Pt educated on medications and suicide prevention precautions. MD to put in a hospitalst consult for patients medical symptoms.   Pt's safety ensured with 15 minute and environmental checks. Pt currently denies SI/HI and A/V hallucinations. Pt verbally agrees to seek staff if SI/HI or A/VH occurs and to consult with staff before acting on any harmful thoughts. Will continue POC.

## 2018-08-16 NOTE — Progress Notes (Addendum)
[facility] MD Progress Note  08/16/2018 10:49 AM [name]  MRN:  [id number] Subjective:   Patient continues to report a vague feeling of weakness and becoming easily tired but does state that he is feeling "a little better every day".  Denies medication side effects.  Describes some lingering depression although acknowledges gradually improving mood.  Denies suicidal ideations.   Objective: I have discussed case with treatment team and have met with patient. 53 year old male, homeless, history of opiate dependence. History of COPD, Hypothyroidism and of  pharyngeal cancer in 2014.  Recently diagnosed with Influenza B ( now cleared, currently afebrile, temperature 97.7) He presented to hospital due to depression, suicidal ideations.   Patient presents with some improvement.  Affect is noted to be more reactive and today fuller in range.  Denies suicidal ideations and currently presents future oriented, interested in going to a rehab at discharge.  Continues to endorse some right ear pain/discomfort-no drainage.  Reports some lingering dizziness but to a lesser degree than yesterday.  Vitals-127/89, pulse 87, temperature 99.1.  Pulse ox at room air 99%.  CBG 121, CBC results pending.  Visible on unit, spending time in dayroom, pleasant on approach.  * of note, patient reports history of throat cancer in 2013, states was treated to remission , but has not returned for follow ups recently. Denies any new mass, lymph node, or dysphagia.  Principal Problem: Cocaine abuse with cocaine-induced mood disorder (HCC) Diagnosis: Principal Problem:   Cocaine abuse with cocaine-induced mood disorder (HCC) Active Problems:   Major depressive disorder, recurrent episode, severe ([place])  Total Time spent with patient: 20 minutes  Past Psychiatric History: See admission H&P  Past Medical History:  Past Medical History:  Diagnosis Date  . Headache(784.0)   . Heart murmur    child  . Hx of radiation  therapy 05/19/13- 06/27/13   glottic larynx, right true vocal cord 6300 cGy 28 sessions  . Hypothyroidism   . Polysubstance abuse ([place])   . Shortness of breath   . 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 BALLOON DILATION N/A 12/30/2013   Procedure: BALLOON DILATION;  Surgeon: Garlan Fair, MD;  Location: [facility];  Service: Endoscopy;  Laterality: N/A;  . DIRECT LARYNGOSCOPY Right 04/24/2013   Procedure: DIRECT LARYNGOSCOPY and BIOPSY OF TONGUE;  Surgeon: Jerrell Belfast, MD;  Location: [facility];  Service: ENT;  Laterality: Right;  . ESOPHAGEAL MANOMETRY N/A 09/07/2014   Procedure: ESOPHAGEAL MANOMETRY (EM);  Surgeon: Garlan Fair, MD;  Location: [facility];  Service: Endoscopy;  Laterality: N/A;  . ESOPHAGOGASTRODUODENOSCOPY (EGD) WITH PROPOFOL N/A 12/30/2013   Procedure: ESOPHAGOGASTRODUODENOSCOPY (EGD) WITH PROPOFOL;  Surgeon: Garlan Fair, MD;  Location: [facility];  Service: Endoscopy;  Laterality: N/A;  . MICROLARYNGOSCOPY Right 04/24/2013   Procedure: MICROLARYNGOSCOPY and RIGHT VOCAL CORD BIOPSY;  Surgeon: Jerrell Belfast, MD;  Location: [facility];  Service: ENT;  Laterality: Right;  . MULTIPLE EXTRACTIONS WITH ALVEOLOPLASTY N/A 09/30/2013   Procedure: Extraction of tooth #'s 2,3,4,5,6,7,8,9,10,11,12,15,22,23,24,25,26,27,28 with alveoloplasty;  Surgeon: Lenn Cal, DDS;  Location: [facility];  Service: Oral Surgery;  Laterality: N/A;  . TONSILLECTOMY     as a child  . wisdon teeth     age 61   Family History:  Family History  Problem Relation Age of Onset  . COPD Mother   . Emphysema Mother   .  Heart attack Father    Family Psychiatric  History: See admission H&P Social History:  Social History   Substance and Sexual Activity  Alcohol Use No  . Alcohol/week: 0.0 standard drinks     Social History   Substance and Sexual Activity  Drug Use Yes  . Types:  Marijuana, Cocaine, Heroin   Comment: Heroin    Social History   Socioeconomic History  . Marital status: Legally Separated    Spouse name: Not on file  . Number of children: 3  . Years of education: Not on file  . 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 Financial resource strain: Not on file  . Food insecurity:    Worry: Not on file    Inability: Not on file  . Transportation needs:    Medical: Not on file    Non-medical: Not on file  Tobacco Use  . Smoking status: Current Every Day Smoker    Packs/day: 1.00    Years: 34.00    Pack years: 34.00    Types: Cigarettes  . Smokeless tobacco: Never Used  . Tobacco comment: DOWN TO 4 - 5 CIGARETTES A DAY  Substance and Sexual Activity  . Alcohol use: No    Alcohol/week: 0.0 standard drinks  . Drug use: Yes    Types: Marijuana, Cocaine, Heroin    Comment: Heroin  . Sexual activity: Yes    Birth control/protection: Condom  Lifestyle  . Physical activity:    Days per week: Not on file    Minutes per session: Not on file  . Stress: Not on file  Relationships  . Social connections:    Talks on phone: Not on file    Gets together: Not on file    Attends religious service: Not on file    Active member of club or organization: Not on file    Attends meetings of clubs or organizations: Not on file    Relationship status: Not on file  Other Topics Concern  . Not on file  Social History Narrative   The patient is married. Patient has 3 children. 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   The patient has a history of smoking 2 packs of cigarette a day for 34 years. Patient current is trying to quit smoking. Patient denies use of alcohol. Patient denies use of other illicit drugs.   Additional Social History:   Sleep: Improved  Appetite:  Good  Current Medications: Current Facility-Administered Medications  Medication Dose  Route Frequency Provider Last Rate Last Dose  . acetaminophen (TYLENOL) tablet 650 mg  650 mg Oral Q6H PRN Ethelene Hal, NP   650 mg at 08/14/18 1006  . albuterol (PROVENTIL HFA;VENTOLIN HFA) 108 (90 Base) MCG/ACT inhaler 2 puff  2 puff Inhalation Q4H PRN Lindon Romp A, NP   2 puff at 08/16/18 0948  . alum & mag hydroxide-simeth (MAALOX/MYLANTA) 200-200-20 MG/5ML suspension 30 mL  30 mL Oral Q4H PRN Ethelene Hal, NP      . amoxicillin-clavulanate (AUGMENTIN) 875-125 MG per tablet 1 tablet  1 tablet Oral Q12H Ethelene Hal, NP   1 tablet at 08/16/18 0809  . cloNIDine (CATAPRES) tablet 0.1 mg  0.1 mg Oral QAC breakfast Lindon Romp A, NP   0.1 mg at 08/16/18 0810  . DULoxetine (CYMBALTA) DR capsule 40 mg  40 mg Oral Daily  Cobos, Myer Peer, MD   40 mg at 08/16/18 0810  . gabapentin (NEURONTIN) capsule 100 mg  100 mg Oral BID Cobos, Myer Peer, MD   100 mg at 08/16/18 0810  . hydrOXYzine (ATARAX/VISTARIL) tablet 25 mg  25 mg Oral Q6H PRN Patriciaann Clan E, PA-C      . ipratropium-albuterol (DUONEB) 0.5-2.5 (3) MG/3ML nebulizer solution 3 mL  3 mL Nebulization Q6H PRN Lindon Romp A, NP   3 mL at 08/11/18 2223  . levothyroxine (SYNTHROID, LEVOTHROID) tablet 175 mcg  175 mcg Oral QAC breakfast Ethelene Hal, NP   175 mcg at 08/16/18 0545  . magnesium hydroxide (MILK OF MAGNESIA) suspension 30 mL  30 mL Oral Daily PRN Ethelene Hal, NP      . nicotine (NICODERM CQ - dosed in mg/24 hours) patch 21 mg  21 mg Transdermal Daily [name], MD   21 mg at 08/16/18 [phone]  . pantoprazole (PROTONIX) EC tablet 40 mg  40 mg Oral Daily [name], MD   40 mg at 08/16/18 0809  . traZODone (DESYREL) tablet 150 mg  150 mg Oral QHS PRN Laverle Hobby, PA-C   150 mg at 08/15/18 2109    Lab Results:  Results for orders placed or performed during the hospital encounter of 08/10/18 (from the past 48 hour(s))  Glucose, capillary     Status: Abnormal   Collection  Time: 08/15/18  5:20 PM  Result Value Ref Range   Glucose-Capillary 121 (H) 70 - 99 mg/dL    Blood Alcohol level:  Lab Results  Component Value Date   ETH <10 08/09/2018   ETH <10 29/92/4268    Metabolic Disorder Labs: Lab Results  Component Value Date   HGBA1C 6.9 (H) 08/11/2018   MPG 151.33 08/11/2018   No results found for: PROLACTIN Lab Results  Component Value Date   CHOL 213 (H) 08/11/2018   TRIG 122 08/11/2018   HDL 46 08/11/2018   CHOLHDL 4.6 08/11/2018   VLDL 24 08/11/2018   LDLCALC 143 (H) 08/11/2018   LDLCALC 82 07/09/2014    Physical Findings: AIMS: Facial and Oral Movements Muscles of Facial Expression: None, normal Lips and Perioral Area: None, normal Jaw: None, normal Tongue: None, normal,Extremity Movements Upper (arms, wrists, hands, fingers): None, normal Lower (legs, knees, ankles, toes): None, normal, Trunk Movements Neck, shoulders, hips: None, normal, Overall Severity Severity of abnormal movements (highest score from questions above): None, normal Incapacitation due to abnormal movements: None, normal Patient's awareness of abnormal movements (rate only patient's report): No Awareness, Dental Status Current problems with teeth and/or dentures?: No Does patient usually wear dentures?: No  CIWA:    COWS:  COWS Total Score: 1  Musculoskeletal: Strength & Muscle Tone: within normal limits Gait & Station: normal Patient leans: N/A  Psychiatric Specialty Exam: Physical Exam  Nursing note and vitals reviewed. Constitutional: He is oriented to person, place, and time. He appears well-developed.  Cardiovascular: Normal rate.  Respiratory: Effort normal.  Neurological: He is alert and oriented to person, place, and time.    Review of Systems  Constitutional: Negative.   Respiratory: Positive for shortness of breath (on exertion).   Cardiovascular: Negative.   Gastrointestinal: Positive for nausea and vomiting.  Musculoskeletal: Positive  for myalgias.  Psychiatric/Behavioral: Positive for depression (improving) and substance abuse (hx cocaine, opioids, THC). Negative for hallucinations, memory loss and suicidal ideas. The patient is not nervous/anxious and does not have insomnia.   Gradually improving right  ear discomfort, no drainage.  Decreased/improving dizziness.  No chest pain.  No shortness of breath at room air.  Blood pressure 127/89, pulse 90, temperature 99.1 F (37.3 C), temperature source Oral, resp. rate 20, height 5' 11" (1.803 m), weight 59 kg, SpO2 99 %.Body mass index is 18.13 kg/m.  With RN present I examined patient-difficult to visualize right tympanic membrane, some erythema.  Mouth/throat- edentulous, no mass or abscess noted.  Neck: No lymph nodes noted.  Patient denies dysphagia.   General Appearance: Improving grooming  Eye Contact:  Good  Speech:  Normal Rate  Volume:  Normal  Mood:  Gradual improvement, less depressed  Affect:  Less constricted, smiles at times appropriately  Thought Process:  Linear and Descriptions of Associations: Intact  Orientation:  Other:  fully alert and attentive  Thought Content:  denies hallucinations , no delusions expressed   Suicidal Thoughts:  No denies current suicidal or self injurious ideations, denies homicidal or violent ideations   Homicidal Thoughts:  No  Memory:  recent and remote grossly intact   Judgement:  Other:  improving   Insight:  improving   Psychomotor Activity:  Normal  Concentration:  Concentration: Good and Attention Span: Good  Recall:  Good  Fund of Knowledge:  Good  Language:  Good  Akathisia:  No  Handed:  Right  AIMS (if indicated):     Assets:  Communication Skills Desire for Improvement Resilience  ADL's:  Intact  Cognition:  WNL  Sleep:  Number of Hours: 6.75   Assessment -  53 year old male, homeless, history of opiate dependence. History of COPD, Hypothyroidism and of  pharyngeal cancer in 2014.  Recently diagnosed with  Influenza B ( now cleared, currently afebrile, temperature 97.7) He presented to hospital due to depression, suicidal ideations.   Patient reports some improvement compared to admission, but describes lingering depression and low energy level . Denies current suicidal ideations. No psychotic symptoms. No current opiate WDL symptoms. Recent Influenza , possible Otitis , now afebrile, breathing comfortably at room air with pulse Ox 98%. At this time  being treated with Augmentin.   Patient continues to improve gradually.  Remains depressed but acknowledges feeling better and affect is more reactive.  Denies suicidal ideations.  He attributes some of his residual weakness and feeling tired to recent influenza.  He is eating well, tolerating fluids well , no dyspnea at room air, pulse ox 99% at room air.  He continues to be future oriented and hopes to go to a rehab at discharge.  Currently on Cymbalta, Neurontin-doses were decreased yesterday as it was felt that they could be contributing to dizziness.    Treatment Plan Summary: Daily contact with patient to assess and evaluate symptoms and progress in treatment and Medication management  Treatment plan reviewed as below today 1/24 Encourage group and milieu participation to work on coping skills and symptom reduction Encourage efforts to work on sobriety and relapse prevention  Treatment team working on disposition planning options - as noted, patient interested in going to rehab at D/C Continue Cymbalta  40 mgrs QDAY for depression Continue Neurontin  100 mgrs BID for anxiety, pain Continue  Augmentin 875-125 mg PO Q12HR for infection/otitis Continue Duoneb nebulizer Q6HR PRN wheezing, SOB Completing  Clonidine taper for opiate detox  Continue Vistaril 25 mg PO Q6HR PRN anxiety Continue Trazodone 100 mg PO QHS PRN insomnia Continue Synthroid 175 mcg PO daily for hypothyroidism BMP and CBC pending  Patient encouraged to  follow up with his  PCP/ Oncologist for further periodic outpatient treatment and monitoring  Jenne Campus, MD 08/16/2018, 10:49 AM   Patient ID: [name], male   DOB: 29-Feb-1966, 53 y.o.   MRN: [id number]

## 2018-08-16 NOTE — Tx Team (Signed)
Interdisciplinary Treatment and Diagnostic Plan Update  08/16/2018 Time of Session: 0830AM [name] MRN: [id number]  Principal Diagnosis: Cocaine abuse with cocaine-induced mood disorder ([place])  Secondary Diagnoses: Principal Problem:   Cocaine abuse with cocaine-induced mood disorder (HCC) Active Problems:   Major depressive disorder, recurrent episode, severe (HCC)   Current Medications:  Current Facility-Administered Medications  Medication Dose Route Frequency Provider Last Rate Last Dose  . acetaminophen (TYLENOL) tablet 650 mg  650 mg Oral Q6H PRN Ethelene Hal, NP   650 mg at 08/14/18 1006  . albuterol (PROVENTIL HFA;VENTOLIN HFA) 108 (90 Base) MCG/ACT inhaler 2 puff  2 puff Inhalation Q4H PRN Lindon Romp A, NP   2 puff at 08/15/18 1347  . alum & mag hydroxide-simeth (MAALOX/MYLANTA) 200-200-20 MG/5ML suspension 30 mL  30 mL Oral Q4H PRN Ethelene Hal, NP      . amoxicillin-clavulanate (AUGMENTIN) 875-125 MG per tablet 1 tablet  1 tablet Oral Q12H Ethelene Hal, NP   1 tablet at 08/16/18 0809  . cloNIDine (CATAPRES) tablet 0.1 mg  0.1 mg Oral QAC breakfast Lindon Romp A, NP   0.1 mg at 08/16/18 0810  . DULoxetine (CYMBALTA) DR capsule 40 mg  40 mg Oral Daily Cobos, Myer Peer, MD   40 mg at 08/16/18 0810  . gabapentin (NEURONTIN) capsule 100 mg  100 mg Oral BID Cobos, Myer Peer, MD   100 mg at 08/16/18 0810  . hydrOXYzine (ATARAX/VISTARIL) tablet 25 mg  25 mg Oral Q6H PRN Patriciaann Clan E, PA-C      . ipratropium-albuterol (DUONEB) 0.5-2.5 (3) MG/3ML nebulizer solution 3 mL  3 mL Nebulization Q6H PRN Lindon Romp A, NP   3 mL at 08/11/18 2223  . levothyroxine (SYNTHROID, LEVOTHROID) tablet 175 mcg  175 mcg Oral QAC breakfast Ethelene Hal, NP   175 mcg at 08/16/18 0545  . magnesium hydroxide (MILK OF MAGNESIA) suspension 30 mL  30 mL Oral Daily PRN Ethelene Hal, NP      . nicotine (NICODERM CQ - dosed in mg/24 hours) patch 21 mg  21  mg Transdermal Daily [name], MD   21 mg at 08/16/18 [phone]  . pantoprazole (PROTONIX) EC tablet 40 mg  40 mg Oral Daily [name], MD   40 mg at 08/16/18 0809  . traZODone (DESYREL) tablet 150 mg  150 mg Oral QHS PRN Laverle Hobby, PA-C   150 mg at 08/15/18 2109   PTA Medications: Medications Prior to Admission  Medication Sig Dispense Refill Last Dose  . [EXPIRED] amoxicillin-clavulanate (AUGMENTIN) 875-125 MG tablet Take 1 tablet by mouth every 12 (twelve) hours for 7 days. (Patient not taking: Reported on 08/09/2018) 14 tablet 0 Not Taking at Unknown time  . levothyroxine (SYNTHROID, LEVOTHROID) 175 MCG tablet Take 1 tablet (175 mcg total) by mouth daily before breakfast for 30 days. Void previous prescription on file for levothyroxine (Patient not taking: Reported on 08/09/2018) 30 tablet 0 Not Taking at Unknown time    Patient Stressors: Financial difficulties Health problems Substance abuse  Patient Strengths: Active sense of humor Average or above average intelligence Capable of independent living Motivation for treatment/growth  Treatment Modalities: Medication Management, Group therapy, Case management,  1 to 1 session with clinician, Psychoeducation, Recreational therapy.   Physician Treatment Plan for Primary Diagnosis: Cocaine abuse with cocaine-induced mood disorder ([place]) Long Term Goal(s): Improvement in symptoms so as ready for discharge Improvement in symptoms so as ready for discharge  Short Term Goals: Ability to identify changes in lifestyle to reduce recurrence of condition will improve Ability to verbalize feelings will improve Ability to maintain clinical measurements within normal limits will improve Compliance with prescribed medications will improve Ability to identify changes in lifestyle to reduce recurrence of condition will improve Ability to demonstrate self-control will improve Ability to identify and develop effective coping  behaviors will improve Compliance with prescribed medications will improve  Medication Management: Evaluate patient's response, side effects, and tolerance of medication regimen.  Therapeutic Interventions: 1 to 1 sessions, Unit Group sessions and Medication administration.  Evaluation of Outcomes: Progressing  Physician Treatment Plan for Secondary Diagnosis: Principal Problem:   Cocaine abuse with cocaine-induced mood disorder ([place]) Active Problems:   Major depressive disorder, recurrent episode, severe ([place])  Long Term Goal(s): Improvement in symptoms so as ready for discharge Improvement in symptoms so as ready for discharge   Short Term Goals: Ability to identify changes in lifestyle to reduce recurrence of condition will improve Ability to verbalize feelings will improve Ability to maintain clinical measurements within normal limits will improve Compliance with prescribed medications will improve Ability to identify changes in lifestyle to reduce recurrence of condition will improve Ability to demonstrate self-control will improve Ability to identify and develop effective coping behaviors will improve Compliance with prescribed medications will improve     Medication Management: Evaluate patient's response, side effects, and tolerance of medication regimen.  Therapeutic Interventions: 1 to 1 sessions, Unit Group sessions and Medication administration.  Evaluation of Outcomes: Progressing   RN Treatment Plan for Primary Diagnosis: Cocaine abuse with cocaine-induced mood disorder ([place]) Long Term Goal(s): Knowledge of disease and therapeutic regimen to maintain health will improve  Short Term Goals: Ability to remain free from injury will improve, Ability to verbalize frustration and anger appropriately will improve, Ability to demonstrate self-control and Ability to disclose and discuss suicidal ideas  Medication Management: RN will administer medications as ordered by  provider, will assess and evaluate patient's response and provide education to patient for prescribed medication. RN will report any adverse and/or side effects to prescribing provider.  Therapeutic Interventions: 1 on 1 counseling sessions, Psychoeducation, Medication administration, Evaluate responses to treatment, Monitor vital signs and CBGs as ordered, Perform/monitor CIWA, COWS, AIMS and Fall Risk screenings as ordered, Perform wound care treatments as ordered.  Evaluation of Outcomes: Progressing   LCSW Treatment Plan for Primary Diagnosis: Cocaine abuse with cocaine-induced mood disorder ([place]) Long Term Goal(s): Safe transition to appropriate next level of care at discharge, Engage patient in therapeutic group addressing interpersonal concerns.  Short Term Goals: Engage patient in aftercare planning with referrals and resources, Increase emotional regulation, Facilitate patient progression through stages of change regarding substance use diagnoses and concerns and Identify triggers associated with mental health/substance abuse issues  Therapeutic Interventions: Assess for all discharge needs, 1 to 1 time with Social worker, Explore available resources and support systems, Assess for adequacy in community support network, Educate family and significant other(s) on suicide prevention, Complete Psychosocial Assessment, Interpersonal group therapy.  Evaluation of Outcomes: Progressing   Progress in Treatment: Attending groups: Yes. Participating in groups: Yes. Taking medication as prescribed: Yes. Toleration medication: Yes. Family/Significant other contact made: Yes, completed with pts daughter Patient understands diagnosis: Yes. Discussing patient identified problems/goals with staff: Yes. Medical problems stabilized or resolved: Yes. Denies suicidal/homicidal ideation: Yes.  Issues/concerns per patient self-inventory: No. Other: n/a   New problem(s) identified: Yes, pt currently  has an ear infection and low grade  fever.  New Short Term/Long Term Goal(s): detox, medication management for mood stabilization;  development of comprehensive mental wellness/sobriety plan.   Patient Goals:  "to get off drugs, stop being depressed, and get help finding housing."   Discharge Plan or Barriers: CSW assessing for appropriate referrals. Pt declined admissions at [facility] and has [facility] screening scheduled for Thursday 08/22/18. [place] pamphlet, Mobile Crisis information, and AA/NA information provided to patient for additional community support and resources.   Reason for Continuation of Hospitalization: Anxiety Depression Medical Issues Medication stabilization  Estimated Length of Stay: Tuesday, 08/20/2018  Attendees: Patient: [name] 08/16/2018 9:20 AM  Physician: Dr. Parke Poisson MD 08/16/2018 9:20 AM  Nursing: Clarise Cruz RN; Patrice RN 08/16/2018 9:20 AM  RN Care Manager:x 08/16/2018 9:20 AM  Social Worker: Janice Norrie LCSW 08/16/2018 9:20 AM  Recreational Therapist: x 08/16/2018 9:20 AM  Other: Harriett Sine NP 08/16/2018 9:20 AM  Other:  08/16/2018 9:20 AM  Other: 08/16/2018 9:20 AM    Scribe for Treatment Team: Avelina Laine, LCSW 08/16/2018 9:20 AM

## 2018-08-17 MED ORDER — CLOBETASOL PROPIONATE 0.05 % EX CREA
TOPICAL_CREAM | Freq: Two times a day (BID) | CUTANEOUS | Status: DC
  Administered 2018-08-17: 1 via TOPICAL
  Administered 2018-08-18 (×2): via TOPICAL
  Filled 2018-08-17 (×3): qty 15

## 2018-08-17 MED ORDER — DIPHENHYDRAMINE HCL 25 MG PO CAPS
25.0000 mg | ORAL_CAPSULE | Freq: Four times a day (QID) | ORAL | Status: DC | PRN
  Administered 2018-08-17: 25 mg via ORAL
  Filled 2018-08-17: qty 1

## 2018-08-17 MED ORDER — POTASSIUM CHLORIDE CRYS ER 20 MEQ PO TBCR
40.0000 meq | EXTENDED_RELEASE_TABLET | Freq: Once | ORAL | Status: AC
  Administered 2018-08-17: 40 meq via ORAL
  Filled 2018-08-17 (×2): qty 2

## 2018-08-17 MED ORDER — DIPHENHYDRAMINE HCL 50 MG PO CAPS
50.0000 mg | ORAL_CAPSULE | Freq: Once | ORAL | Status: AC
  Administered 2018-08-17: 50 mg via ORAL
  Filled 2018-08-17: qty 2
  Filled 2018-08-17: qty 1

## 2018-08-17 NOTE — Progress Notes (Signed)
D. Pt has been pleasant upon approach- friendly during interactions. Observed in the milieu interacting appropriately with peers and staff. Per pt's self inventory, pt rated his depression, hopelessness and anxiety all 10's. Pt reported having slept poorly last night, despite having had sleep medication. Earlier this am, pt complained of moderate withdrawal symptoms (chilling, cramping, diarrhea)- somewhat improved this afternoon. Pt continues to complain of his mouth "feeling raw" and a newly formed rash on abdomen. MD notified.Pt currently denies SI/HI and AV hallucinations. A. Labs and vitals monitored. Pt compliant with medications. Pt supported emotionally and encouraged to express concerns and ask questions.   R. Pt remains safe with 15 minute checks. Will continue to monitor rash.Marland Kitchen

## 2018-08-17 NOTE — Progress Notes (Addendum)
[facility] MD Progress Note  08/17/2018 1:59 PM TEEJAY MEADER  MRN:  [id number] Subjective:   Rayvion Stumph continues to report a vague feeling of weakness and becoming easily tired but does state that he is feeling "a little better every day".  Denies medication side effects.  Describes some lingering depression although acknowledges gradually improving mood.  Denies suicidal ideations.   Objective: Redmond Whittley is a 53 year old male, homeless, history of opiate dependence. History of COPD, Hypothyroidism and of  pharyngeal cancer in 2014. Recently diagnosed with Influenza B ( now cleared, currently afebrile, temperature 97.7) He presented to hospital due to depression, suicidal ideations.   Patient presents with some improvement and stated he is feeling better today. Affect is noted to be more reactive, pleasant, smiling appropriately and cooperative. He expressed a desire to go to rehab upon discharge so he will stay sober. He stated he has had throat cancer and was recently hospitalized for the flu and pneumonia. He stated he was told then that he has COPD. He ruminates about his health issues.   He denies suicidal ideations and currently presents future oriented. Continues to endorse some right ear pain/discomfort-no drainage.  Reports some lingering dizziness but to a lesser degree than yesterday and stated it bothers him most when he goes from sitting to standing.   Vitals-132/92, pulse 88, temperature 98.  Pulse ox at room air 99%.  CBG 121, CBC results within normal limits except Potassium at 3.2. K-Dur 40 meq  one time dose ordered.  Visible on unit, spending time in dayroom, pleasant on approach.  * of note, patient reports history of throat cancer in 2013, states was treated to remission , but has not returned for follow ups recently. Denies any new mass, lymph node, or dysphagia.  Principal Problem: Cocaine abuse with cocaine-induced mood disorder (HCC) Diagnosis: Principal Problem:    Cocaine abuse with cocaine-induced mood disorder (HCC) Active Problems:   Major depressive disorder, recurrent episode, severe ([place])  Total Time spent with patient: 20 minutes  Past Psychiatric History: See admission H&P  Past Medical History:  Past Medical History:  Diagnosis Date  . Headache(784.0)   . Heart murmur    child  . Hx of radiation therapy 05/19/13- 06/27/13   glottic larynx, right true vocal cord 6300 cGy 28 sessions  . Hypothyroidism   . Polysubstance abuse ([place])   . Shortness of breath   . 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 BALLOON DILATION N/A 12/30/2013   Procedure: BALLOON DILATION;  Surgeon: Garlan Fair, MD;  Location: [facility];  Service: Endoscopy;  Laterality: N/A;  . DIRECT LARYNGOSCOPY Right 04/24/2013   Procedure: DIRECT LARYNGOSCOPY and BIOPSY OF TONGUE;  Surgeon: Jerrell Belfast, MD;  Location: [facility];  Service: ENT;  Laterality: Right;  . ESOPHAGEAL MANOMETRY N/A 09/07/2014   Procedure: ESOPHAGEAL MANOMETRY (EM);  Surgeon: Garlan Fair, MD;  Location: [facility];  Service: Endoscopy;  Laterality: N/A;  . ESOPHAGOGASTRODUODENOSCOPY (EGD) WITH PROPOFOL N/A 12/30/2013   Procedure: ESOPHAGOGASTRODUODENOSCOPY (EGD) WITH PROPOFOL;  Surgeon: Garlan Fair, MD;  Location: [facility];  Service: Endoscopy;  Laterality: N/A;  . MICROLARYNGOSCOPY Right 04/24/2013   Procedure: MICROLARYNGOSCOPY and RIGHT VOCAL CORD BIOPSY;  Surgeon: Jerrell Belfast, MD;  Location: [place];  Service: ENT;  Laterality: Right;  . MULTIPLE EXTRACTIONS WITH ALVEOLOPLASTY N/A 09/30/2013   Procedure: Extraction of  tooth #'s 2,3,4,5,6,7,8,9,10,11,12,15,22,23,24,25,26,27,28 with alveoloplasty;  Surgeon: Lenn Cal, DDS;  Location: [facility];  Service: Oral Surgery;  Laterality: N/A;  . TONSILLECTOMY     as a child  . wisdon teeth     age 77   Family History:   Family History  Problem Relation Age of Onset  . COPD Mother   . Emphysema Mother   . Heart attack Father    Family Psychiatric  History: See admission H&P Social History:  Social History   Substance and Sexual Activity  Alcohol Use No  . Alcohol/week: 0.0 standard drinks     Social History   Substance and Sexual Activity  Drug Use Yes  . Types: Marijuana, Cocaine, Heroin   Comment: Heroin    Social History   Socioeconomic History  . Marital status: Legally Separated    Spouse name: Not on file  . Number of children: 3  . Years of education: Not on file  . 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 Financial resource strain: Not on file  . Food insecurity:    Worry: Not on file    Inability: Not on file  . Transportation needs:    Medical: Not on file    Non-medical: Not on file  Tobacco Use  . Smoking status: Current Every Day Smoker    Packs/day: 1.00    Years: 34.00    Pack years: 34.00    Types: Cigarettes  . Smokeless tobacco: Never Used  . Tobacco comment: DOWN TO 4 - 5 CIGARETTES A DAY  Substance and Sexual Activity  . Alcohol use: No    Alcohol/week: 0.0 standard drinks  . Drug use: Yes    Types: Marijuana, Cocaine, Heroin    Comment: Heroin  . Sexual activity: Yes    Birth control/protection: Condom  Lifestyle  . Physical activity:    Days per week: Not on file    Minutes per session: Not on file  . Stress: Not on file  Relationships  . Social connections:    Talks on phone: Not on file    Gets together: Not on file    Attends religious service: Not on file    Active member of club or organization: Not on file    Attends meetings of clubs or organizations: Not on file    Relationship status: Not on file  Other Topics Concern  . Not on file  Social History Narrative   The patient is married. Patient has 3 children. 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   The patient has a history of smoking 2 packs of cigarette a day for 34 years. Patient current is trying to quit smoking. Patient denies use of alcohol. Patient denies use of other illicit drugs.   Additional Social History:   Sleep: Improved  Appetite:  Good  Current Medications: Current Facility-Administered Medications  Medication Dose Route Frequency Provider Last Rate Last Dose  . acetaminophen (TYLENOL) tablet 650 mg  650 mg Oral Q6H PRN Ethelene Hal, NP   650 mg at 08/14/18 1006  . albuterol (PROVENTIL HFA;VENTOLIN HFA) 108 (90 Base) MCG/ACT inhaler 2 puff  2 puff Inhalation Q4H PRN Lindon Romp A, NP   2 puff at 08/17/18 [phone]  . alum & mag hydroxide-simeth (MAALOX/MYLANTA) 200-200-20 MG/5ML suspension 30 mL  30 mL Oral Q4H PRN Ethelene Hal, NP      .  DULoxetine (CYMBALTA) DR capsule 40 mg  40 mg Oral Daily Jansen Goodpasture, Myer Peer, MD   40 mg at 08/17/18 0820  . gabapentin (NEURONTIN) capsule 100 mg  100 mg Oral BID Velvie Thomaston, Myer Peer, MD   100 mg at 08/17/18 0820  . hydrOXYzine (ATARAX/VISTARIL) tablet 25 mg  25 mg Oral Q6H PRN Laverle Hobby, PA-C   25 mg at 08/16/18 2108  . ipratropium-albuterol (DUONEB) 0.5-2.5 (3) MG/3ML nebulizer solution 3 mL  3 mL Nebulization Q6H PRN Lindon Romp A, NP   3 mL at 08/16/18 1141  . levothyroxine (SYNTHROID, LEVOTHROID) tablet 175 mcg  175 mcg Oral QAC breakfast Ethelene Hal, NP   175 mcg at 08/17/18 4098  . magnesium hydroxide (MILK OF MAGNESIA) suspension 30 mL  30 mL Oral Daily PRN Ethelene Hal, NP      . nicotine (NICODERM CQ - dosed in mg/24 hours) patch 21 mg  21 mg Transdermal Daily [name], MD   21 mg at 08/17/18 0820  . pantoprazole (PROTONIX) EC tablet 40 mg  40 mg Oral Daily [name], MD   40 mg at 08/17/18 0820  . traZODone (DESYREL) tablet 150 mg  150 mg Oral QHS PRN Laverle Hobby, PA-C   150 mg at 08/16/18 2108    Lab Results:  Results for orders placed or  performed during the hospital encounter of 08/10/18 (from the past 48 hour(s))  Glucose, capillary     Status: Abnormal   Collection Time: 08/15/18  5:20 PM  Result Value Ref Range   Glucose-Capillary 121 (H) 70 - 99 mg/dL  CBC with Differential/Platelet     Status: Abnormal   Collection Time: 08/16/18  6:23 PM  Result Value Ref Range   WBC 4.4 4.0 - 10.5 K/uL   RBC 3.38 (L) 4.22 - 5.81 MIL/uL   Hemoglobin 10.7 (L) 13.0 - 17.0 g/dL   HCT 33.5 (L) 39.0 - 52.0 %   MCV 99.1 80.0 - 100.0 fL   MCH 31.7 26.0 - 34.0 pg   MCHC 31.9 30.0 - 36.0 g/dL   RDW 13.8 11.5 - 15.5 %   Platelets 217 150 - 400 K/uL   nRBC 0.0 0.0 - 0.2 %   Neutrophils Relative % 58 %   Neutro Abs 2.6 1.7 - 7.7 K/uL   Lymphocytes Relative 31 %   Lymphs Abs 1.4 0.7 - 4.0 K/uL   Monocytes Relative 7 %   Monocytes Absolute 0.3 0.1 - 1.0 K/uL   Eosinophils Relative 3 %   Eosinophils Absolute 0.2 0.0 - 0.5 K/uL   Basophils Relative 1 %   Basophils Absolute 0.0 0.0 - 0.1 K/uL   Immature Granulocytes 0 %   Abs Immature Granulocytes 0.01 0.00 - 0.07 K/uL    Comment: Performed at [facility], [place] [address] [place], [place] [address]  Basic metabolic panel     Status: Abnormal   Collection Time: 08/16/18  6:23 PM  Result Value Ref Range   Sodium 144 135 - 145 mmol/L   Potassium 3.2 (L) 3.5 - 5.1 mmol/L   Chloride 108 98 - 111 mmol/L   CO2 26 22 - 32 mmol/L   Glucose, Bld 123 (H) 70 - 99 mg/dL   BUN 19 6 - 20 mg/dL   Creatinine, Ser 1.11 0.61 - 1.24 mg/dL   Calcium 8.1 (L) 8.9 - 10.3 mg/dL   GFR calc non Af Amer >60 >60 mL/min   GFR calc Af  Amer >60 >60 mL/min   Anion gap 10 5 - 15    Comment: Performed at [facility], [place] [address], [place], [place] [address]    Blood Alcohol level:  Lab Results  Component Value Date   [facility] <10 08/09/2018   ETH <10 44/31/5400    Metabolic Disorder Labs: Lab Results  Component Value Date   HGBA1C 6.9 (H) 08/11/2018   MPG 151.33  08/11/2018   No results found for: PROLACTIN Lab Results  Component Value Date   CHOL 213 (H) 08/11/2018   TRIG 122 08/11/2018   HDL 46 08/11/2018   CHOLHDL 4.6 08/11/2018   VLDL 24 08/11/2018   LDLCALC 143 (H) 08/11/2018   LDLCALC 82 07/09/2014    Physical Findings: AIMS: Facial and Oral Movements Muscles of Facial Expression: None, normal Lips and Perioral Area: None, normal Jaw: None, normal Tongue: None, normal,Extremity Movements Upper (arms, wrists, hands, fingers): None, normal Lower (legs, knees, ankles, toes): None, normal, Trunk Movements Neck, shoulders, hips: None, normal, Overall Severity Severity of abnormal movements (highest score from questions above): None, normal Incapacitation due to abnormal movements: None, normal Patient's awareness of abnormal movements (rate only patient's report): No Awareness, Dental Status Current problems with teeth and/or dentures?: No Does patient usually wear dentures?: No  CIWA:  CIWA-Ar Total: 0 COWS:  COWS Total Score: 3  Musculoskeletal: Strength & Muscle Tone: within normal limits Gait & Station: normal Patient leans: N/A  Psychiatric Specialty Exam: Physical Exam  Nursing note and vitals reviewed. Constitutional: He is oriented to person, place, and time. He appears well-developed.  Cardiovascular: Normal rate.  Respiratory: Effort normal.  Neurological: He is alert and oriented to person, place, and time.    Review of Systems  Constitutional: Negative.   Respiratory: Positive for shortness of breath (on exertion).   Cardiovascular: Negative.   Gastrointestinal: Positive for nausea and vomiting.  Musculoskeletal: Positive for myalgias.  Psychiatric/Behavioral: Positive for depression (improving) and substance abuse (hx cocaine, opioids, THC). Negative for hallucinations, memory loss and suicidal ideas. The patient is not nervous/anxious and does not have insomnia.   Gradually improving right ear discomfort, no  drainage.  Decreased/improving dizziness.  No chest pain.  No shortness of breath at room air.  Blood pressure (!) 132/92, pulse 88, temperature 98 F (36.7 C), resp. rate 20, height 5\' 11"  (1.803 m), weight 59 kg, SpO2 99 %.Body mass index is 18.13 kg/m.  With RN present I examined patient-difficult to visualize right tympanic membrane, some erythema.  Mouth/throat- edentulous, no mass or abscess noted.  Neck: No lymph nodes noted.  Patient denies dysphagia.   General Appearance: Improving grooming, clean and neat today  Eye Contact:  Good  Speech:  Normal Rate  Volume:  Normal  Mood:  Gradual improvement, less depressed  Affect:  Less constricted, smiles at times appropriately  Thought Process:  Linear and Descriptions of Associations: Intact  Orientation:  Other:  fully alert and attentive  Thought Content:  denies hallucinations , no delusions expressed   Suicidal Thoughts:  No denies current suicidal or self injurious ideations, denies homicidal or violent ideations   Homicidal Thoughts:  No  Memory:  recent and remote grossly intact   Judgement:  Other:  improving   Insight:  improving   Psychomotor Activity:  Normal  Concentration:  Concentration: Good and Attention Span: Good  Recall:  Good  Fund of Knowledge:  Good  Language:  Good  Akathisia:  No  Handed:  Right  AIMS (if indicated):     Assets:  Communication Skills Desire for Improvement Resilience  ADL's:  Intact  Cognition:  WNL  Sleep:  Number of Hours: 6.25   Assessment -  53 year old male, homeless, history of opiate dependence. History of COPD, Hypothyroidism and of  pharyngeal cancer in 2014.  Recently diagnosed with Influenza B ( now cleared, currently afebrile, temperature 97.7) He presented to hospital due to depression, suicidal ideations.   Patient reports some improvement compared to admission, but describes lingering depression and low energy level . Denies current suicidal ideations. No psychotic  symptoms. No current opiate WDL symptoms. Recent Influenza , possible Otitis , now afebrile, breathing comfortably at room air with pulse Ox 98%. At this time  being treated with Augmentin.   Patient continues to improve gradually.  Remains depressed but acknowledges feeling better and affect is more reactive.  Denies suicidal ideations.  He attributes some of his residual weakness and feeling tired to recent influenza.  He is eating well, tolerating fluids well , no dyspnea at room air, pulse ox 99% at room air.  He continues to be future oriented and hopes to go to a rehab at discharge.  Currently on Cymbalta, Neurontin-doses were decreased yesterday as it was felt that they could be contributing to dizziness.    Treatment Plan Summary: Daily contact with patient to assess and evaluate symptoms and progress in treatment and Medication management  Treatment plan reviewed as below today 1/25 Encourage group and milieu participation to work on coping skills and symptom reduction Encourage efforts to work on sobriety and relapse prevention  Treatment team working on disposition planning options - as noted, patient interested in going to rehab at D/C Continue Cymbalta  40 mgrs QDAY for depression Continue Neurontin  100 mgrs BID for anxiety, pain Continue  Augmentin 875-125 mg PO Q12HR for infection/otitis Continue Duoneb nebulizer Q6HR PRN wheezing, SOB Completing  Clonidine taper for opiate detox  Continue Vistaril 25 mg PO Q6HR PRN anxiety Continue Trazodone 100 mg PO QHS PRN insomnia Continue Synthroid 175 mcg PO daily for hypothyroidism BMP and CBC reviewed. Potassium 3.2 otherwise WNL, K-Dur 40 Meq one time dose ordered Patient encouraged to follow up with his PCP/ Oncologist for further periodic outpatient treatment and monitoring   Ethelene Hal, NP 08/17/2018, 1:59 PM   Addendum 1/25, 6,00 PM Patient seen with RN for complaint of rash, which he states he noted yesterday. Has  mild, macular, pruriginous rash mainly on abdomen . No blisters. No petechiae. No facial or  mucosal involvement . No systemic symptoms. Vitals are stable, no fever. Does not appear to be in any acute distress . No new medications today- most recent added medication is Cymbalta.  For now will discontinue Cymbalta, although may be contact related as states could be related to soap/toiletries used on unit  . Benadryl PRNs as needed. Will continue to monitor and will sign out to nighttime provider  F Rache Klimaszewski MD

## 2018-08-17 NOTE — [facility] Group Notes (Addendum)
LCSW Group Therapy Note  08/17/2018   10:00-11:00am   Type of Therapy and Topic:  Group Therapy: Anger Cues and Responses  Participation Level:  Active   Description of Group:   In this group, patients learned how to recognize the physical, cognitive, emotional, and behavioral responses they have to anger-provoking situations.  They identified a recent time they became angry and how they reacted.  They analyzed how their reaction was possibly beneficial and how it was possibly unhelpful.  The group discussed a variety of healthier coping skills that could help with such a situation in the future.  Deep breathing was practiced briefly.  Therapeutic Goals: 1. Patients will remember their last incident of anger and how they felt emotionally and physically, what their thoughts were at the time, and how they behaved. 2. Patients will identify how their behavior at that time worked for them, as well as how it worked against them. 3. Patients will explore possible new behaviors to use in future anger situations. 4. Patients will learn that anger itself is normal and cannot be eliminated, and that healthier reactions can assist with resolving conflict rather than worsening situations.  Summary of Patient Progress:  The patient shared that he was not an angry person but later stated he was often angry at himself for using drugs. He also expressed anger at the social security disability administration for denying his claim. He is a throat cancer survivor and expressed frustration with mental health system. The patient did not identify coping skills that he uses but verbalized his dissatisfaction with his past care in the hospital.  Therapeutic Modalities:   Cognitive Behavioral Therapy  Rolanda Jay

## 2018-08-17 NOTE — Progress Notes (Signed)
Patient ID: [name], male   DOB: 1966/03/31, 53 y.o.   MRN: [id number]   Patient reports abdominal rash. Endorses pruritis across abdomen, shoulders, and back. Denies fever. Noted macular rash across abdomen. No eruption noted on shoulders or back, however, skin appears dry. No blistering of mucous membranes noted. Based on previous notes, rash does not appear to be worsening. Ordered topical steroid and benadryl for itching.

## 2018-08-17 NOTE — Progress Notes (Signed)
The patient attended the evening A.A.meeting and was appropriate.  

## 2018-08-17 NOTE — Progress Notes (Signed)
Patient has been up in the dayroom interacting with peers. Writer spoke with him 1:1 and he reports being hopeful to get into [facility] and wants to stay here until he is able to go. He reports that if he has to go back to the streets before discharge he feels that he won't make it. Writer encouraged him to think positive that everything will work out. He was informed of his medications, support given and safety maintained on unit with 15 min checks.

## 2018-08-18 LAB — BASIC METABOLIC PANEL
Anion gap: 8 (ref 5–15)
BUN: 15 mg/dL (ref 6–20)
CO2: 25 mmol/L (ref 22–32)
CREATININE: 1.18 mg/dL (ref 0.61–1.24)
Calcium: 8.4 mg/dL — ABNORMAL LOW (ref 8.9–10.3)
Chloride: 110 mmol/L (ref 98–111)
GFR calc Af Amer: >60 mL/min (ref >60)
GFR calc non Af Amer: >60 mL/min (ref >60)
Glucose, Bld: 116 mg/dL — ABNORMAL HIGH (ref 70–99)
Potassium: 3.2 mmol/L — ABNORMAL LOW (ref 3.5–5.1)
Sodium: 143 mmol/L (ref 135–145)

## 2018-08-18 MED ORDER — GABAPENTIN 100 MG PO CAPS
200.0000 mg | ORAL_CAPSULE | Freq: Three times a day (TID) | ORAL | Status: DC
  Administered 2018-08-18 – 2018-08-20 (×6): 200 mg via ORAL
  Filled 2018-08-18: qty 2
  Filled 2018-08-18: qty 96
  Filled 2018-08-18 (×3): qty 2
  Filled 2018-08-18 (×5): qty 96
  Filled 2018-08-18 (×6): qty 2

## 2018-08-18 MED ORDER — MIRTAZAPINE 7.5 MG PO TABS
7.5000 mg | ORAL_TABLET | Freq: Every day | ORAL | Status: DC
  Administered 2018-08-18 – 2018-08-19 (×2): 7.5 mg via ORAL
  Filled 2018-08-18: qty 1
  Filled 2018-08-18: qty 16
  Filled 2018-08-18 (×3): qty 1
  Filled 2018-08-18: qty 16

## 2018-08-18 MED ORDER — HYDROXYZINE HCL 25 MG PO TABS
25.0000 mg | ORAL_TABLET | Freq: Three times a day (TID) | ORAL | Status: DC | PRN
  Filled 2018-08-18: qty 40

## 2018-08-18 NOTE — [facility] Group Notes (Signed)
[facility] LCSW Group Therapy Note  Date/Time:  08/18/2018 9:00-10:00 or 10:00-11:00AM  Type of Therapy and Topic:  Group Therapy:  Healthy and Unhealthy Supports  Participation Level:  Minimal   Description of Group:  Patients in this group were introduced to the idea of adding a variety of healthy supports to address the various needs in their lives.Patients discussed what additional healthy supports could be helpful in their recovery and wellness after discharge in order to prevent future hospitalizations.   An emphasis was placed on using counselor, doctor, therapy groups, 12-step groups, and problem-specific support groups to expand supports.  They also worked as a group on developing a specific plan for several patients to deal with unhealthy supports through [place], psychoeducation with loved ones, and even termination of relationships.   Therapeutic Goals:   1)  discuss importance of adding supports to stay well once out of the hospital  2)  compare healthy versus unhealthy supports and identify some examples of each  3)  generate ideas and descriptions of healthy supports that can be added  4)  offer mutual support about how to address unhealthy supports  5)  encourage active participation in and adherence to discharge plan    Summary of Patient Progress:  The patient stated that current healthy supports in his life is [facility] while current unhealthy supports include "dope dealer".  The patient expressed a willingness to add therapy as support to help in his recovery journey.   Therapeutic Modalities:   Motivational Interviewing Brief Solution-Focused Therapy  Rolanda Jay

## 2018-08-18 NOTE — [facility] Group Notes (Signed)
[facility] Group Notes:  (Nursing)  Date:  08/18/2018  Time: 200 PM Type of Therapy:  Nurse Education  Participation Level:  Did Not Attend   Waymond Cera 08/18/2018, 5:55 PM

## 2018-08-18 NOTE — Progress Notes (Signed)
Patient did attend the evening speaker AA meeting.  

## 2018-08-18 NOTE — Progress Notes (Signed)
D. Pt presents with an anxious affect/mood- friendly during interactions. Pt reports having, what he described to be a panic attack during group led by SW this am-reports being triggered by something mentioned in group. Pt left during group and approached this nurse to ask for his inhaler. Pt reported that he felt 'shakey' and started to have trouble breathing. Pt was unable to express what exactly the trigger was, but stated, "they just don't understand what its like unless you're in their skin"  Per pt's self inventory, pt rates his depression, hopelessness and anxiety all 10's. Pt reports that he didn't sleep last night , stating, "need to try something different to sleep stuff now doesn't work at all". Pt writes that his goal today is, "feeling better, praying I can get in [facility]. Just can't go on street till then" and "go to jail before staying on the streets till then".  Pt currently denies SI/HI and AV hallucinations. A. Labs and vitals monitored. Pt compliant with  medications. Pt supported emotionally and encouraged to express concerns and ask questions.   R. Pt remains safe with 15 minute checks. Will continue POC.

## 2018-08-18 NOTE — Progress Notes (Signed)
Patient has been up in the dayroom watching tv and interacting with peers. He attended group this evening. Patient was assessed by [facility] for his rash on his abdomen and medications ordered to hlp with itching. He reports -si/hi/a/v hall. Support and encouragement offered, safety maintained on unit, will continue to monitor.

## 2018-08-18 NOTE — Progress Notes (Addendum)
[facility] MD Progress Note  08/18/2018 2:37 PM SHARIQ PUIG  MRN:  [id number] Subjective:   [name] continues to report a vague feeling of weakness and becoming easily tired but does state that he is feeling "a little better every day".  Denies medication side effects.  Describes some lingering depression although acknowledges gradually improving mood.  Denies suicidal ideations.   Objective: Masaki Rothbauer is a 53 year old male, homeless, history of opiate dependence. History of COPD, Hypothyroidism and of  pharyngeal cancer in 2014. Recently diagnosed with Influenza B ( now cleared, currently afebrile, temperature 97.7) He presented to hospital due to depression, suicidal ideations.   Patient presents with some improvement and stated he is feeling better today. Affect is noted to be more reactive, pleasant, smiling appropriately and cooperative. He expressed a desire to go to rehab upon discharge so he will stay sober. He stated he has had throat cancer and was recently hospitalized for the flu and pneumonia. He stated he was told then that he has COPD. He ruminates about his health issues.   He denies suicidal ideations and currently presents future oriented. Continues to endorse some right ear pain/discomfort-no drainage.  Reports some lingering dizziness but to a lesser degree than yesterday and stated it bothers him most when he goes from sitting to standing.   Vitals-134/86, pulse 88, temperature 98.  Pulse ox at room air 99%.  CBG 121, CBC results within normal limits except Potassium at 3.2. K-Dur 40 meq  one time dose ordered.  Visible on unit, spending time in dayroom, pleasant on approach.  * of note, patient reports history of throat cancer in 2013, states was treated to remission , but has not returned for follow ups recently. Denies any new mass, lymph node, or dysphagia.  Principal Problem: Cocaine abuse with cocaine-induced mood disorder (HCC) Diagnosis: Principal Problem:    Cocaine abuse with cocaine-induced mood disorder (HCC) Active Problems:   Major depressive disorder, recurrent episode, severe ([place])  Total Time spent with patient: 20 minutes  Past Psychiatric History: See admission H&P  Past Medical History:  Past Medical History:  Diagnosis Date  . Headache(784.0)   . Heart murmur    child  . Hx of radiation therapy 05/19/13- 06/27/13   glottic larynx, right true vocal cord 6300 cGy 28 sessions  . Hypothyroidism   . Polysubstance abuse ([place])   . Shortness of breath   . 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 BALLOON DILATION N/A 12/30/2013   Procedure: BALLOON DILATION;  Surgeon: Garlan Fair, MD;  Location: [facility];  Service: Endoscopy;  Laterality: N/A;  . DIRECT LARYNGOSCOPY Right 04/24/2013   Procedure: DIRECT LARYNGOSCOPY and BIOPSY OF TONGUE;  Surgeon: Jerrell Belfast, MD;  Location: [facility];  Service: ENT;  Laterality: Right;  . ESOPHAGEAL MANOMETRY N/A 09/07/2014   Procedure: ESOPHAGEAL MANOMETRY (EM);  Surgeon: Garlan Fair, MD;  Location: [facility];  Service: Endoscopy;  Laterality: N/A;  . ESOPHAGOGASTRODUODENOSCOPY (EGD) WITH PROPOFOL N/A 12/30/2013   Procedure: ESOPHAGOGASTRODUODENOSCOPY (EGD) WITH PROPOFOL;  Surgeon: Garlan Fair, MD;  Location: [facility];  Service: Endoscopy;  Laterality: N/A;  . MICROLARYNGOSCOPY Right 04/24/2013   Procedure: MICROLARYNGOSCOPY and RIGHT VOCAL CORD BIOPSY;  Surgeon: Jerrell Belfast, MD;  Location: [place];  Service: ENT;  Laterality: Right;  . MULTIPLE EXTRACTIONS WITH ALVEOLOPLASTY N/A 09/30/2013   Procedure: Extraction of  tooth #'s 2,3,4,5,6,7,8,9,10,11,12,15,22,23,24,25,26,27,28 with alveoloplasty;  Surgeon: Lenn Cal, DDS;  Location: [facility];  Service: Oral Surgery;  Laterality: N/A;  . TONSILLECTOMY     as a child  . wisdon teeth     age 62   Family History:   Family History  Problem Relation Age of Onset  . COPD Mother   . Emphysema Mother   . Heart attack Father    Family Psychiatric  History: See admission H&P Social History:  Social History   Substance and Sexual Activity  Alcohol Use No  . Alcohol/week: 0.0 standard drinks     Social History   Substance and Sexual Activity  Drug Use Yes  . Types: Marijuana, Cocaine, Heroin   Comment: Heroin    Social History   Socioeconomic History  . Marital status: Legally Separated    Spouse name: Not on file  . Number of children: 3  . Years of education: Not on file  . 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 Financial resource strain: Not on file  . Food insecurity:    Worry: Not on file    Inability: Not on file  . Transportation needs:    Medical: Not on file    Non-medical: Not on file  Tobacco Use  . Smoking status: Current Every Day Smoker    Packs/day: 1.00    Years: 34.00    Pack years: 34.00    Types: Cigarettes  . Smokeless tobacco: Never Used  . Tobacco comment: DOWN TO 4 - 5 CIGARETTES A DAY  Substance and Sexual Activity  . Alcohol use: No    Alcohol/week: 0.0 standard drinks  . Drug use: Yes    Types: Marijuana, Cocaine, Heroin    Comment: Heroin  . Sexual activity: Yes    Birth control/protection: Condom  Lifestyle  . Physical activity:    Days per week: Not on file    Minutes per session: Not on file  . Stress: Not on file  Relationships  . Social connections:    Talks on phone: Not on file    Gets together: Not on file    Attends religious service: Not on file    Active member of club or organization: Not on file    Attends meetings of clubs or organizations: Not on file    Relationship status: Not on file  Other Topics Concern  . Not on file  Social History Narrative   The patient is married. Patient has 3 children. 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   The patient has a history of smoking 2 packs of cigarette a day for 34 years. Patient current is trying to quit smoking. Patient denies use of alcohol. Patient denies use of other illicit drugs.   Additional Social History:   Sleep: Improved  Appetite:  Good  Current Medications: Current Facility-Administered Medications  Medication Dose Route Frequency Provider Last Rate Last Dose  . acetaminophen (TYLENOL) tablet 650 mg  650 mg Oral Q6H PRN Ethelene Hal, NP   650 mg at 08/14/18 1006  . albuterol (PROVENTIL HFA;VENTOLIN HFA) 108 (90 Base) MCG/ACT inhaler 2 puff  2 puff Inhalation Q4H PRN Lindon Romp A, NP   2 puff at 08/18/18 1029  . alum & mag hydroxide-simeth (MAALOX/MYLANTA) 200-200-20 MG/5ML suspension 30 mL  30 mL Oral Q4H PRN Ethelene Hal, NP      .  clobetasol cream (TEMOVATE) 0.05 %   Topical BID Lindon Romp A, NP      . diphenhydrAMINE (BENADRYL) capsule 25 mg  25 mg Oral Q6H PRN , Myer Peer, MD   25 mg at 08/17/18 1824  . gabapentin (NEURONTIN) capsule 100 mg  100 mg Oral BID , Myer Peer, MD   100 mg at 08/18/18 0823  . ipratropium-albuterol (DUONEB) 0.5-2.5 (3) MG/3ML nebulizer solution 3 mL  3 mL Nebulization Q6H PRN Lindon Romp A, NP   3 mL at 08/17/18 1602  . levothyroxine (SYNTHROID, LEVOTHROID) tablet 175 mcg  175 mcg Oral QAC breakfast Ethelene Hal, NP   175 mcg at 08/18/18 [id number]  . magnesium hydroxide (MILK OF MAGNESIA) suspension 30 mL  30 mL Oral Daily PRN Ethelene Hal, NP      . nicotine (NICODERM CQ - dosed in mg/24 hours) patch 21 mg  21 mg Transdermal Daily [name], MD   21 mg at 08/18/18 0825  . pantoprazole (PROTONIX) EC tablet 40 mg  40 mg Oral Daily [name], MD   40 mg at 08/18/18 2641  . traZODone (DESYREL) tablet 150 mg  150 mg Oral QHS PRN Laverle Hobby, PA-C   150 mg at 08/17/18 2127    Lab Results:  Results for orders placed or performed during the hospital encounter of  08/10/18 (from the past 48 hour(s))  CBC with Differential/Platelet     Status: Abnormal   Collection Time: 08/16/18  6:23 PM  Result Value Ref Range   WBC 4.4 4.0 - 10.5 K/uL   RBC 3.38 (L) 4.22 - 5.81 MIL/uL   Hemoglobin 10.7 (L) 13.0 - 17.0 g/dL   HCT 33.5 (L) 39.0 - 52.0 %   MCV 99.1 80.0 - 100.0 fL   MCH 31.7 26.0 - 34.0 pg   MCHC 31.9 30.0 - 36.0 g/dL   RDW 13.8 11.5 - 15.5 %   Platelets 217 150 - 400 K/uL   nRBC 0.0 0.0 - 0.2 %   Neutrophils Relative % 58 %   Neutro Abs 2.6 1.7 - 7.7 K/uL   Lymphocytes Relative 31 %   Lymphs Abs 1.4 0.7 - 4.0 K/uL   Monocytes Relative 7 %   Monocytes Absolute 0.3 0.1 - 1.0 K/uL   Eosinophils Relative 3 %   Eosinophils Absolute 0.2 0.0 - 0.5 K/uL   Basophils Relative 1 %   Basophils Absolute 0.0 0.0 - 0.1 K/uL   Immature Granulocytes 0 %   Abs Immature Granulocytes 0.01 0.00 - 0.07 K/uL    Comment: Performed at [facility], [place] [address], [place], [place] [address]  Basic metabolic panel     Status: Abnormal   Collection Time: 08/16/18  6:23 PM  Result Value Ref Range   Sodium 144 135 - 145 mmol/L   Potassium 3.2 (L) 3.5 - 5.1 mmol/L   Chloride 108 98 - 111 mmol/L   CO2 26 22 - 32 mmol/L   Glucose, Bld 123 (H) 70 - 99 mg/dL   BUN 19 6 - 20 mg/dL   Creatinine, Ser 1.11 0.61 - 1.24 mg/dL   Calcium 8.1 (L) 8.9 - 10.3 mg/dL   GFR calc non Af Amer >60 >60 mL/min   GFR calc Af Amer >60 >60 mL/min   Anion gap 10 5 - 15    Comment: Performed at [facility], [place] [address], [place], [place] [address]    Blood Alcohol level:  Lab Results  Component Value Date   ETH <10 08/09/2018   ETH <10 24/58/0998    Metabolic Disorder Labs: Lab Results  Component Value Date   HGBA1C 6.9 (H) 08/11/2018   MPG 151.33 08/11/2018   No results found for: PROLACTIN Lab Results  Component Value Date   CHOL 213 (H) 08/11/2018   TRIG 122 08/11/2018   HDL 46 08/11/2018   CHOLHDL 4.6 08/11/2018   VLDL 24  08/11/2018   LDLCALC 143 (H) 08/11/2018   LDLCALC 82 07/09/2014    Physical Findings: AIMS: Facial and Oral Movements Muscles of Facial Expression: None, normal Lips and Perioral Area: None, normal Jaw: None, normal Tongue: None, normal,Extremity Movements Upper (arms, wrists, hands, fingers): None, normal Lower (legs, knees, ankles, toes): None, normal, Trunk Movements Neck, shoulders, hips: None, normal, Overall Severity Severity of abnormal movements (highest score from questions above): None, normal Incapacitation due to abnormal movements: None, normal Patient's awareness of abnormal movements (rate only patient's report): No Awareness, Dental Status Current problems with teeth and/or dentures?: No Does patient usually wear dentures?: No  CIWA:  CIWA-Ar Total: 0 COWS:  COWS Total Score: 7  Musculoskeletal: Strength & Muscle Tone: within normal limits Gait & Station: normal Patient leans: N/A  Psychiatric Specialty Exam: Physical Exam  Nursing note and vitals reviewed. Constitutional: He is oriented to person, place, and time. He appears well-developed.  Cardiovascular: Normal rate.  Respiratory: Effort normal.  Neurological: He is alert and oriented to person, place, and time.    Review of Systems  Constitutional: Negative.   Respiratory: Positive for shortness of breath (on exertion).   Cardiovascular: Negative.   Gastrointestinal: Positive for nausea and vomiting.  Musculoskeletal: Positive for myalgias.  Psychiatric/Behavioral: Positive for depression (improving) and substance abuse (hx cocaine, opioids, THC). Negative for hallucinations, memory loss and suicidal ideas. The patient is not nervous/anxious and does not have insomnia.   Gradually improving right ear discomfort, no drainage.  Decreased/improving dizziness.  No chest pain.  No shortness of breath at room air.  Blood pressure 134/86, pulse 93, temperature 98.1 F (36.7 C), temperature source Oral, resp.  rate 20, height 5\' 11"  (1.803 m), weight 59 kg, SpO2 100 %.Body mass index is 18.13 kg/m.  With RN present I examined patient-difficult to visualize right tympanic membrane, some erythema.  Mouth/throat- edentulous, no mass or abscess noted.  Neck: No lymph nodes noted.  Patient denies dysphagia.   General Appearance: Improving grooming, clean and neat today  Eye Contact:  Good  Speech:  Normal Rate  Volume:  Normal  Mood:  Gradual improvement, less depressed  Affect:  Less constricted, smiles at times appropriately  Thought Process:  Linear and Descriptions of Associations: Intact  Orientation:  Other:  fully alert and attentive  Thought Content:  denies hallucinations , no delusions expressed   Suicidal Thoughts:  No denies current suicidal or self injurious ideations, denies homicidal or violent ideations   Homicidal Thoughts:  No  Memory:  recent and remote grossly intact   Judgement:  Other:  improving   Insight:  improving   Psychomotor Activity:  Normal  Concentration:  Concentration: Good and Attention Span: Good  Recall:  Good  Fund of Knowledge:  Good  Language:  Good  Akathisia:  No  Handed:  Right  AIMS (if indicated):     Assets:  Communication Skills Desire for Improvement Resilience  ADL's:  Intact  Cognition:  WNL  Sleep:  Number of Hours: 1.25   Assessment -  53 year old male, homeless, history of opiate dependence. History of COPD, Hypothyroidism and of  pharyngeal cancer in 2014.  Recently diagnosed with Influenza B ( now cleared, currently afebrile, temperature 98.1) He presented to hospital due to depression, suicidal ideations.   Patient reports some improvement compared to admission, but describes lingering depression and low energy level . Denies current suicidal ideations. No psychotic symptoms. No current opiate WDL symptoms. Recent Influenza , possible Otitis , now afebrile, breathing comfortably at room air with pulse Ox 98%. At this time  being  treated with Augmentin.   Patient continues to improve gradually.  He appears improved today, smiling, pleasant and good eye contact. He is future oriented and hopes to get into a rehab upon discharge. He is afraid if he is released to the street he will use again and be dead. He is trying to obtain disability but has been denied and is unsure of how to proceed. He stated the social worker has told him they will help him with this. He is still reporting poor sleep. Remains depressed but acknowledges feeling better and affect is more reactive.  Denies suicidal ideations.  He attributes some of his residual weakness and feeling tired to recent influenza.  He is eating well, tolerating fluids well , no dyspnea at room air, pulse ox 99% at room air.   Currently on Neurontin-doses were decreased yesterday as it was felt that they could be contributing to dizziness.   Patient had a rash to his torso and was treated with Temovate cream. His rash is much improved today and he stated it is not itching. His Cymbalta was discontinued in case this was the cause.    Treatment Plan Summary: Daily contact with patient to assess and evaluate symptoms and progress in treatment and Medication management  Treatment plan reviewed as below today 1/26 Encourage group and milieu participation to work on coping skills and symptom reduction Encourage efforts to work on sobriety and relapse prevention  Treatment team working on disposition planning options - as noted, patient interested in going to rehab at D/C Continue Neurontin  100 mgrs BID for anxiety, pain Continue  Augmentin 875-125 mg PO Q12HR for infection/otitis Continue Duoneb nebulizer Q6HR PRN wheezing, SOB Completing  Clonidine taper for opiate detox  Continue Vistaril 25 mg PO Q6HR PRN anxiety Continue Trazodone 100 mg PO QHS PRN insomnia Continue Synthroid 175 mcg PO daily for hypothyroidism BMP and CBC reviewed. Potassium 3.2 otherwise WNL, K-Dur 40 Meq  one time dose ordered. Will repeat BMP today.  Patient encouraged to follow up with his PCP/ Oncologist for further periodic outpatient treatment and monitoring   Ethelene Hal, NP 08/18/2018, 2:37 PM   Agree with NP progress note

## 2018-08-19 MED ORDER — METOPROLOL SUCCINATE ER 25 MG PO TB24
25.0000 mg | ORAL_TABLET | Freq: Every day | ORAL | Status: DC
  Administered 2018-08-19 – 2018-08-20 (×2): 25 mg via ORAL
  Filled 2018-08-19 (×2): qty 1
  Filled 2018-08-19 (×2): qty 16
  Filled 2018-08-19: qty 1

## 2018-08-19 NOTE — [facility] Group Notes (Signed)
LCSW Group Therapy Note   08/19/2018 1:15pm   Type of Therapy and Topic:  Group Therapy:  Overcoming Obstacles   Participation Level:  Did Not Attend--pt invited. Chose to remain in bed.    Description of Group:    In this group patients will be encouraged to explore what they see as obstacles to their own wellness and recovery. They will be guided to discuss their thoughts, feelings, and behaviors related to these obstacles. The group will process together ways to cope with barriers, with attention given to specific choices patients can make. Each patient will be challenged to identify changes they are motivated to make in order to overcome their obstacles. This group will be process-oriented, with patients participating in exploration of their own experiences as well as giving and receiving support and challenge from other group members.   Therapeutic Goals: 1. Patient will identify personal and current obstacles as they relate to admission. 2. Patient will identify barriers that currently interfere with their wellness or overcoming obstacles.  3. Patient will identify feelings, thought process and behaviors related to these barriers. 4. Patient will identify two changes they are willing to make to overcome these obstacles:      Summary of Patient Progress      Therapeutic Modalities:   Cognitive Behavioral Therapy Solution Focused Therapy Motivational Interviewing Relapse Prevention Therapy  Leslie Laine, LCSW 08/19/2018 10:59 AM

## 2018-08-19 NOTE — Progress Notes (Signed)
Patient ID: [name], male   DOB: Jul 27, 1965, 53 y.o.   MRN: [id number] D: Patient in room on approach. Pt reports he is doing well. Pt reports mild withdrawal symptoms including hand tremors and anxiety. Pt reports he is tolerating medication well. Pt denies SI/HI/AVH and pain. Cooperative with assessment. No acute distressed noted at this time.   A: Medications administered as prescribed. Support and encouragement provided to attend groups and engage in milieu. Pt encouraged to discuss feelings and come to staff with any question or concerns.   R: Patient remains safe and complaint with medications.

## 2018-08-19 NOTE — Progress Notes (Signed)
Writer spoke with patient 1:1 and he reports having had a good day but is hopeful the new sleep medication will work for him. He attended group and has been interacting appropriately with peers. Safety maintained on unit with 15 min checks.

## 2018-08-19 NOTE — Progress Notes (Signed)
Recreation Therapy Notes  Date:  1.27.20 Time: 0930 Location: [address] Dayroom  Group Topic: Stress Management  Goal Area(s) Addresses:  Patient will identify positive stress management techniques. Patient will identify benefits of using stress management post d/c.  Behavioral Response:  Engaged  Intervention:  Stress Management  Activity :  Meditation.  LRT introduced stress management technique of meditation.  LRT played a meditation that focused on the possibilities the day could bring.  Patients were to follow along as meditation played in order to engage in meditation.  Education:  Stress Management, Discharge Planning.   Education Outcome: Acknowledges Education  Clinical Observations/Feedback: Pt attended and participated in activity.    [name], LRT/CTRS         [name] A 08/19/2018 12:14 PM

## 2018-08-19 NOTE — Progress Notes (Signed)
Attend group

## 2018-08-19 NOTE — Plan of Care (Addendum)
Patient self inventory- Patient slept fair last night, sleep medication was requested. Appetite is good, energy level low, concentration poor. Depression, hopelessness, and anxiety all rated 10/10. Patient is still claiming alcohol withdrawals such as tremors, cravings, agitation, runny nose, chilling, cramping, and irritability. Denies SI HI AVH. Endorses physical problems such as dizziness, headaches, blurred vision, and pain in his ear. Pain is rated 10/10. Patient's goal is "feeling better and trying to get to [facility]." Patient also said "would love for someone to help with disability. Thanks for the help, have been very grateful." Patient is compliant with medications, no side effects noted.  Safety is maintained with 15 minute checks as well as environmental checks. Will continue to monitor.  Problem: Education: Goal: Knowledge of [place] [facility] Education information/materials will improve Outcome: Progressing Goal: Emotional status will improve Outcome: Progressing Goal: Mental status will improve Outcome: Progressing Goal: Verbalization of understanding the information provided will improve Outcome: Progressing   Problem: Activity: Goal: Interest or engagement in activities will improve Outcome: Progressing

## 2018-08-19 NOTE — Progress Notes (Signed)
[facility] MD Progress Note  08/19/2018 11:01 AM [name]  MRN:  [id number] Subjective:    Patient remains generally irritable complaining of withdrawal symptoms though none are discern systolic blood pressure normal diastolic pulses normal no tremors denies thoughts of harming self or others somewhat irritable and focused on social workers finding rehab does have an interview at [facility] pending  Principal Problem: Cocaine abuse with cocaine-induced mood disorder ([place]) Diagnosis: Principal Problem:   Cocaine abuse with cocaine-induced mood disorder ([place]) Active Problems:   Major depressive disorder, recurrent episode, severe ([place])  Total Time spent with patient: 15 minutes  Past Medical History:  Past Medical History:  Diagnosis Date  . Headache(784.0)   . Heart murmur    child  . Hx of radiation therapy 05/19/13- 06/27/13   glottic larynx, right true vocal cord 6300 cGy 28 sessions  . Hypothyroidism   . Polysubstance abuse ([place])   . Shortness of breath   . 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 BALLOON DILATION N/A 12/30/2013   Procedure: BALLOON DILATION;  Surgeon: Garlan Fair, MD;  Location: [facility];  Service: Endoscopy;  Laterality: N/A;  . DIRECT LARYNGOSCOPY Right 04/24/2013   Procedure: DIRECT LARYNGOSCOPY and BIOPSY OF TONGUE;  Surgeon: Jerrell Belfast, MD;  Location: [facility];  Service: ENT;  Laterality: Right;  . ESOPHAGEAL MANOMETRY N/A 09/07/2014   Procedure: ESOPHAGEAL MANOMETRY (EM);  Surgeon: Garlan Fair, MD;  Location: [facility];  Service: Endoscopy;  Laterality: N/A;  . ESOPHAGOGASTRODUODENOSCOPY (EGD) WITH PROPOFOL N/A 12/30/2013   Procedure: ESOPHAGOGASTRODUODENOSCOPY (EGD) WITH PROPOFOL;  Surgeon: Garlan Fair, MD;  Location: [facility];  Service: Endoscopy;  Laterality: N/A;  . MICROLARYNGOSCOPY Right 04/24/2013   Procedure:  MICROLARYNGOSCOPY and RIGHT VOCAL CORD BIOPSY;  Surgeon: Jerrell Belfast, MD;  Location: [facility];  Service: ENT;  Laterality: Right;  . MULTIPLE EXTRACTIONS WITH ALVEOLOPLASTY N/A 09/30/2013   Procedure: Extraction of tooth #'s 2,3,4,5,6,7,8,9,10,11,12,15,22,23,24,25,26,27,28 with alveoloplasty;  Surgeon: Lenn Cal, DDS;  Location: [facility];  Service: Oral Surgery;  Laterality: N/A;  . TONSILLECTOMY     as a child  . wisdon teeth     age 36   Family History:  Family History  Problem Relation Age of Onset  . COPD Mother   . Emphysema Mother   . Heart attack Father     Social History:  Social History   Substance and Sexual Activity  Alcohol Use No  . Alcohol/week: 0.0 standard drinks     Social History   Substance and Sexual Activity  Drug Use Yes  . Types: Marijuana, Cocaine, Heroin   Comment: Heroin    Social History   Socioeconomic History  . Marital status: Legally Separated    Spouse name: Not on file  . Number of children: 3  . Years of education: Not on file  . 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 Financial resource strain: Not on file  . Food insecurity:    Worry: Not on file    Inability: Not on file  . Transportation needs:    Medical: Not on file    Non-medical: Not on file  Tobacco Use  . Smoking status: Current Every Day Smoker    Packs/day: 1.00    Years: 34.00  Pack years: 34.00    Types: Cigarettes  . Smokeless tobacco: Never Used  . Tobacco comment: DOWN TO 4 - 5 CIGARETTES A DAY  Substance and Sexual Activity  . Alcohol use: No    Alcohol/week: 0.0 standard drinks  . Drug use: Yes    Types: Marijuana, Cocaine, Heroin    Comment: Heroin  . Sexual activity: Yes    Birth control/protection: Condom  Lifestyle  . Physical activity:    Days per week: Not on file    Minutes per session: Not on file  . Stress: Not on file  Relationships  .  Social connections:    Talks on phone: Not on file    Gets together: Not on file    Attends religious service: Not on file    Active member of club or organization: Not on file    Attends meetings of clubs or organizations: Not on file    Relationship status: Not on file  Other Topics Concern  . Not on file  Social History Narrative   The patient is married. Patient has 3 children. 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   The patient has a history of smoking 2 packs of cigarette a day for 34 years. Patient current is trying to quit smoking. Patient denies use of alcohol. Patient denies use of other illicit drugs.   Current Medications: Current Facility-Administered Medications  Medication Dose Route Frequency Provider Last Rate Last Dose  . acetaminophen (TYLENOL) tablet 650 mg  650 mg Oral Q6H PRN Ethelene Hal, NP   650 mg at 08/14/18 1006  . albuterol (PROVENTIL HFA;VENTOLIN HFA) 108 (90 Base) MCG/ACT inhaler 2 puff  2 puff Inhalation Q4H PRN Lindon Romp A, NP   2 puff at 08/18/18 1029  . alum & mag hydroxide-simeth (MAALOX/MYLANTA) 200-200-20 MG/5ML suspension 30 mL  30 mL Oral Q4H PRN Ethelene Hal, NP      . clobetasol cream (TEMOVATE) 0.05 %   Topical BID Lindon Romp A, NP      . diphenhydrAMINE (BENADRYL) capsule 25 mg  25 mg Oral Q6H PRN Cobos, Myer Peer, MD   25 mg at 08/17/18 1824  . gabapentin (NEURONTIN) capsule 200 mg  200 mg Oral TID Money, Lowry Ram, FNP   200 mg at 08/19/18 1610  . hydrOXYzine (ATARAX/VISTARIL) tablet 25 mg  25 mg Oral TID PRN Money, Lowry Ram, FNP      . ipratropium-albuterol (DUONEB) 0.5-2.5 (3) MG/3ML nebulizer solution 3 mL  3 mL Nebulization Q6H PRN Lindon Romp A, NP   3 mL at 08/18/18 1538  . levothyroxine (SYNTHROID, LEVOTHROID) tablet 175 mcg  175 mcg Oral QAC breakfast Ethelene Hal, NP   175 mcg at 08/19/18 [phone]  . magnesium hydroxide (MILK OF MAGNESIA) suspension 30 mL  30 mL Oral Daily PRN Ethelene Hal, NP       . metoprolol succinate (TOPROL-XL) 24 hr tablet 25 mg  25 mg Oral Daily Johnn Hai, MD      . mirtazapine (REMERON) tablet 7.5 mg  7.5 mg Oral QHS Ethelene Hal, NP   7.5 mg at 08/18/18 2131  . nicotine (NICODERM CQ - dosed in mg/24 hours) patch 21 mg  21 mg Transdermal Daily [name], MD   21 mg at 08/19/18 0813  . pantoprazole (PROTONIX) EC tablet 40 mg  40 mg Oral Daily [name], MD   40 mg at 08/19/18 0813  . traZODone (DESYREL) tablet  150 mg  150 mg Oral QHS PRN Laverle Hobby, PA-C   150 mg at 08/18/18 2309    Lab Results:  Results for orders placed or performed during the hospital encounter of 08/10/18 (from the past 48 hour(s))  Basic metabolic panel     Status: Abnormal   Collection Time: 08/18/18  6:26 PM  Result Value Ref Range   Sodium 143 135 - 145 mmol/L   Potassium 3.2 (L) 3.5 - 5.1 mmol/L   Chloride 110 98 - 111 mmol/L   CO2 25 22 - 32 mmol/L   Glucose, Bld 116 (H) 70 - 99 mg/dL   BUN 15 6 - 20 mg/dL   Creatinine, Ser 1.18 0.61 - 1.24 mg/dL   Calcium 8.4 (L) 8.9 - 10.3 mg/dL   GFR calc non Af Amer >60 >60 mL/min   GFR calc Af Amer >60 >60 mL/min   Anion gap 8 5 - 15    Comment: Performed at [facility], [place] [address], [place], [place] [address]    Blood Alcohol level:  Lab Results  Component Value Date   [facility] <10 08/09/2018   ETH <10 [id number]/[id number]    Metabolic Disorder Labs: Lab Results  Component Value Date   HGBA1C 6.9 (H) 08/11/2018   MPG 151.33 08/11/2018   No results found for: PROLACTIN Lab Results  Component Value Date   CHOL 213 (H) 08/11/2018   TRIG 122 08/11/2018   HDL 46 08/11/2018   CHOLHDL 4.6 08/11/2018   VLDL 24 08/11/2018   LDLCALC 143 (H) 08/11/2018   LDLCALC 82 07/09/2014    Physical Findings: AIMS: Facial and Oral Movements Muscles of Facial Expression: None, normal Lips and Perioral Area: None, normal Jaw: None, normal Tongue: None, normal,Extremity Movements Upper  (arms, wrists, hands, fingers): None, normal Lower (legs, knees, ankles, toes): None, normal, Trunk Movements Neck, shoulders, hips: None, normal, Overall Severity Severity of abnormal movements (highest score from questions above): None, normal Incapacitation due to abnormal movements: None, normal Patient's awareness of abnormal movements (rate only patient's report): No Awareness, Dental Status Current problems with teeth and/or dentures?: No Does patient usually wear dentures?: No  CIWA:  CIWA-Ar Total: 0 COWS:  COWS Total Score: 7  Musculoskeletal: Strength & Muscle Tone: within normal limits Gait & Station: normal Patient leans: N/A  Psychiatric Specialty Exam: Physical Exam  ROS  Blood pressure (!) 126/98, pulse 99, temperature (!) 97.4 F (36.3 C), temperature source Oral, resp. rate 20, height 5\' 11"  (1.803 m), weight 59 kg, SpO2 100 %.Body mass index is 18.13 kg/m.  General Appearance: Casual  Eye Contact:  Good  Speech:  Slow and Slurred  Volume:  Decreased  Mood:  Irritable  Affect:  Constricted  Thought Process:  Goal Directed  Orientation:  Full (Time, Place, and Person)  Thought Content:  Tangential  Suicidal Thoughts:  No  Homicidal Thoughts:  No  Memory:  Immediate;   Fair  Judgement:  Fair  Insight:  Fair  Psychomotor Activity:  Decreased  Concentration:  Concentration: Fair  Recall:  [employer] of Knowledge:  Fair  Language:  Fair  Akathisia:  Negative  Handed:  Right  AIMS (if indicated):     Assets:  Leisure Time  ADL's:  Intact  Cognition:  WNL  Sleep:  Number of Hours: 1.25     Treatment Plan Summary: Daily contact with patient to assess and evaluate symptoms and progress in treatment, Medication management and Plan Detox largely complete no  change in meds or precautions probable discharge to [facility] tomorrow if not pending will probably discharge to shelter with [facility] interview pending  Johnn Hai, MD 08/19/2018, 11:01 AM

## 2018-08-20 MED ORDER — LEVOTHYROXINE SODIUM 100 MCG PO TABS
100.0000 ug | ORAL_TABLET | Freq: Every day | ORAL | Status: DC
  Filled 2018-08-20: qty 1

## 2018-08-20 MED ORDER — CLOBETASOL PROPIONATE 0.05 % EX CREA: TOPICAL_CREAM | Freq: Two times a day (BID) | CUTANEOUS | 0 refills | Status: DC

## 2018-08-20 MED ORDER — HYDROXYZINE HCL 25 MG PO TABS: 25.0000 mg | ORAL_TABLET | Freq: Three times a day (TID) | ORAL | 0 refills | Status: DC | PRN

## 2018-08-20 MED ORDER — METOPROLOL SUCCINATE ER 25 MG PO TB24: 25.0000 mg | ORAL_TABLET | Freq: Every day | ORAL | 0 refills | Status: DC

## 2018-08-20 MED ORDER — ALBUTEROL SULFATE HFA 108 (90 BASE) MCG/ACT IN AERS: 2.0000 | INHALATION_SPRAY | RESPIRATORY_TRACT | 0 refills | Status: DC | PRN

## 2018-08-20 MED ORDER — GABAPENTIN 100 MG PO CAPS: 200.0000 mg | ORAL_CAPSULE | Freq: Three times a day (TID) | ORAL | 0 refills | Status: DC

## 2018-08-20 MED ORDER — LEVOTHYROXINE SODIUM 175 MCG PO TABS: 175.0000 ug | ORAL_TABLET | Freq: Every day | ORAL | 0 refills | Status: DC

## 2018-08-20 MED ORDER — MIRTAZAPINE 7.5 MG PO TABS: 7.5000 mg | ORAL_TABLET | Freq: Every day | ORAL | 0 refills | Status: DC

## 2018-08-20 MED ORDER — TRAZODONE HCL 150 MG PO TABS: 150.0000 mg | ORAL_TABLET | Freq: Every evening | ORAL | 0 refills | Status: DC | PRN

## 2018-08-20 MED ORDER — LEVOTHYROXINE SODIUM 75 MCG PO TABS
75.0000 ug | ORAL_TABLET | Freq: Every day | ORAL | Status: DC
  Filled 2018-08-20: qty 1

## 2018-08-20 MED ORDER — PANTOPRAZOLE SODIUM 40 MG PO TBEC: 40.0000 mg | DELAYED_RELEASE_TABLET | Freq: Every day | ORAL | 0 refills | Status: DC

## 2018-08-20 NOTE — Progress Notes (Signed)
  [facility] Adult Case Management Discharge Plan :  Will you be returning to the same living situation after discharge:  Yes,  [place]. I won't stay in a shelter."  At discharge, do you have transportation home?: Yes,  bus passes provided per pt request.  Do you have the ability to pay for your medications: Yes,  mental health  Release of information consent forms completed and submitted to medical records by CSW.  Patient to Follow up at: Follow-up Information    Services, [facility] Follow up.   Why:  Screening for possible admission on Thursday, 08/22/2018 at 7:45AM. Please bring: proof of [place] residency/photo ID, 30 day supply of prescribed medications and 30 day prescriptions, and clothing. Thank you.  Contact information: [address] [place] [address] [phone]        [facility] Follow up on 08/26/2018.   Specialty:  Behavioral Health Why:  Hospital follow up appointment is 2/3 at 9:00a. Pleaase bring your photo ID, proof of insurance, SSN, current medications and discharge paperwork from this hospitalization.  Contact information: [place] [address] [phone]           Next level of care provider has access to [place] and Suicide Prevention discussed: Yes,  SPE completed with pt; pt declined to consent to collateral contact. SPI pamphlet and mobile crisis information provided.      Has patient been referred to the Quitline?: Patient refused referral  Patient has been referred for addiction treatment: Yes  Avelina Laine, LCSW 08/20/2018, 9:40 AM

## 2018-08-20 NOTE — Discharge Summary (Signed)
Physician Discharge Summary Note  Patient:  [name] is an 53 y.o., male  MRN:  [id number]  DOB:  1965-08-30  Patient phone:  [phone] (home)   Patient address:   [place] [address],   Total Time spent with patient: Greater than 30 minutes  Date of Admission:  08/10/2018  Date of Discharge: 08-20-18  Reason for Admission: Suicidal threats to daughter via text plans to kill himself with a gun or overdose on pills.  Principal Problem: Cocaine abuse with cocaine-induced mood disorder [facility])  Discharge Diagnoses: Principal Problem:   Cocaine abuse with cocaine-induced mood disorder (HCC) Active Problems:   Major depressive disorder, recurrent episode, severe ([place])  Past Psychiatric History: Opioid & Cocaine use disorders, Major depression.  Past Medical History:  Past Medical History:  Diagnosis Date  . Headache(784.0)   . Heart murmur    child  . Hx of radiation therapy 05/19/13- 06/27/13   glottic larynx, right true vocal cord 6300 cGy 28 sessions  . Hypothyroidism   . Polysubstance abuse ([place])   . Shortness of breath   . 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 BALLOON DILATION N/A 12/30/2013   Procedure: BALLOON DILATION;  Surgeon: Garlan Fair, MD;  Location: [facility];  Service: Endoscopy;  Laterality: N/A;  . DIRECT LARYNGOSCOPY Right 04/24/2013   Procedure: DIRECT LARYNGOSCOPY and BIOPSY OF TONGUE;  Surgeon: Jerrell Belfast, MD;  Location: [place];  Service: ENT;  Laterality: Right;  . ESOPHAGEAL MANOMETRY N/A 09/07/2014   Procedure: ESOPHAGEAL MANOMETRY (EM);  Surgeon: Garlan Fair, MD;  Location: [facility];  Service: Endoscopy;  Laterality: N/A;  . ESOPHAGOGASTRODUODENOSCOPY (EGD) WITH PROPOFOL N/A 12/30/2013   Procedure: ESOPHAGOGASTRODUODENOSCOPY (EGD) WITH PROPOFOL;  Surgeon: Garlan Fair, MD;  Location: [facility];   Service: Endoscopy;  Laterality: N/A;  . MICROLARYNGOSCOPY Right 04/24/2013   Procedure: MICROLARYNGOSCOPY and RIGHT VOCAL CORD BIOPSY;  Surgeon: Jerrell Belfast, MD;  Location: [place];  Service: ENT;  Laterality: Right;  . MULTIPLE EXTRACTIONS WITH ALVEOLOPLASTY N/A 09/30/2013   Procedure: Extraction of tooth #'s 2,3,4,5,6,7,8,9,10,11,12,15,22,23,24,25,26,27,28 with alveoloplasty;  Surgeon: Lenn Cal, DDS;  Location: [facility];  Service: Oral Surgery;  Laterality: N/A;  . TONSILLECTOMY     as a child  . wisdon teeth     age 58   Family History:  Family History  Problem Relation Age of Onset  . COPD Mother   . Emphysema Mother   . Heart attack Father    Family Psychiatric  History: See H&P  Social History:  Social History   Substance and Sexual Activity  Alcohol Use No  . Alcohol/week: 0.0 standard drinks     Social History   Substance and Sexual Activity  Drug Use Yes  . Types: Marijuana, Cocaine, Heroin   Comment: Heroin    Social History   Socioeconomic History  . Marital status: Legally Separated    Spouse name: Not on file  . Number of children: 3  . Years of education: Not on file  . 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 Financial resource strain: Not on file  . Food insecurity:    Worry: Not on file    Inability: Not on file  . Transportation needs:    Medical: Not on  file    Non-medical: Not on file  Tobacco Use  . Smoking status: Current Every Day Smoker    Packs/day: 1.00    Years: 34.00    Pack years: 34.00    Types: Cigarettes  . Smokeless tobacco: Never Used  . Tobacco comment: DOWN TO 4 - 5 CIGARETTES A DAY  Substance and Sexual Activity  . Alcohol use: No    Alcohol/week: 0.0 standard drinks  . Drug use: Yes    Types: Marijuana, Cocaine, Heroin    Comment: Heroin  . Sexual activity: Yes    Birth control/protection: Condom  Lifestyle  .  Physical activity:    Days per week: Not on file    Minutes per session: Not on file  . Stress: Not on file  Relationships  . Social connections:    Talks on phone: Not on file    Gets together: Not on file    Attends religious service: Not on file    Active member of club or organization: Not on file    Attends meetings of clubs or organizations: Not on file    Relationship status: Not on file  Other Topics Concern  . Not on file  Social History Narrative   The patient is married. Patient has 3 children. 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   The patient has a history of smoking 2 packs of cigarette a day for 34 years. Patient current is trying to quit smoking. Patient denies use of alcohol. Patient denies use of other illicit drugs.   Hospital Course: (Per Md's admission evaluation):  Patient is a 53 year old male with a past psychiatric history significant for polysubstance dependence, COPD, recent positive test for influenza who [facility] to the [facility] emergency department on 08/10/2026 with suicidal ideation. The patient reportedly sent a text message to his daughter telling her that he wanted to kill himself with a gun, and also told his daughter that he wanted to overdose on opiates and did not want to have Narcan given to him.  Patient has had multiple emergency room visits because of unintentional overdose of opiates in the past.  He also had presented on 1/17 with a COPD exacerbation, and had been discharged.  His influenza B was diagnosed on 08/01/2018.  He denied any previous admissions for detox, he denied any previous admissions for residential substance abuse treatment.  He is essentially homeless.  He also has a past medical history of throat cancer in 2014 where he received radiation.  On admission his laboratories revealed a potassium of 3.0, a creatinine of 1.34, a lipase of 77, and a total protein of 5.3.  His white blood cell count was 3.6.  He is  mildly anemic with a hemoglobin and hematocrit of 11.9 and 36.2 respectively.  Her urine creatinine was obtained which was 132.2. Urine sodium was obtained that was 59.  His blood alcohol on admission was less than 10.  He did have a positive fecal occult blood.  His drug screen was positive for opiates and cocaine.  He was admitted to the hospital for evaluation and stabilization.  After the above admission evaluation, Rondall was started on the medication regimen for his presenting symptoms. He was admitted with his uDS reports positive for opioid. He was not presenting with any substance withdral symptoms. He was however, medicated, stabilized & discharged on; all the medications as listed below. He was also enrolled & participated in the group counseling sessions being  offered & held on this unit. He learned coping skills that should help him after discharge to cope better. He presented other significant pre-existing medical issues that required treatment. He was resumed & discharged on his pertinent home medications for those health issues.   As his treatment progressed, daily assessment notes marked improvement in his symptoms. He is pleased with this. He plans to engage with [facility] in just 2 days. He plans to do 30 days. He is optimistic about the future. No psychotic features. No craving for substances. No anxiety. The features of depression has markedly improved since starting medications. No thoughts of violence. No access to weapons. No new stressors.    Majestic's case was presented during treatment team meeting this morning. The nursing staff reports that patient has been appropriate on the unit. Patient has been interacting well with peers. No behavioral issues. Patient has not voiced any suicidal thoughts. Patient has not been observed to be internally stimulated or preoccupied. Patient has been adherent with treatment recommendations. Patient has been tolerating their  medication well.   During care review & discussion of his progress this morning at the treatment team meeting. Team members feel that patient is back to his baseline level of function. Team agreed with plan to discharge patient today to continue substance abuse treatment at the [facility] treatment center in 2 days. And for mental health care & medication management, Kveon will be receiving these services at the [facility]. He is provided with all the necessary information needed to make these appointments without any problems.  He was provided with a 7 days worth, supply samples of his [facility] discharge medications. He was able to engage in safety planning including plan to return to [facility] or contact emergency services if he feels unable to maintain hisown safety or the safety of others. Pt had no further questions, comments or concerns. He left [facility] with all personal belongings in no apparent distress.  Physical Findings: AIMS: Facial and Oral Movements Muscles of Facial Expression: None, normal Lips and Perioral Area: None, normal Jaw: None, normal Tongue: None, normal,Extremity Movements Upper (arms, wrists, hands, fingers): None, normal Lower (legs, knees, ankles, toes): None, normal, Trunk Movements Neck, shoulders, hips: None, normal, Overall Severity Severity of abnormal movements (highest score from questions above): None, normal Incapacitation due to abnormal movements: None, normal Patient's awareness of abnormal movements (rate only patient's report): No Awareness, Dental Status Current problems with teeth and/or dentures?: No Does patient usually wear dentures?: No  CIWA:  CIWA-Ar Total: 0 COWS:  COWS Total Score: 7  Musculoskeletal: Strength & Muscle Tone: within normal limits Gait & Station: normal Patient leans: N/A  Psychiatric Specialty Exam: Physical Exam  Constitutional: He appears well-developed.  HENT:  Head: Normocephalic.  Eyes: Pupils are equal,  round, and reactive to light.  Neck: Normal range of motion.  Cardiovascular: Normal rate.  Respiratory: Effort normal.  GI: Soft.  Genitourinary:    Genitourinary Comments: Deferred   Musculoskeletal: Normal range of motion.  Neurological: He is alert.  Skin: Skin is warm.    Review of Systems  Constitutional: Negative.   HENT: Negative.   Eyes: Negative.   Respiratory: Negative.  Negative for cough and shortness of breath.   Cardiovascular: Negative.  Negative for chest pain and palpitations.  Gastrointestinal: Negative.  Negative for abdominal pain, heartburn, nausea and vomiting.  Genitourinary: Negative.   Musculoskeletal: Negative.   Skin: Negative.   Neurological: Negative.  Negative for dizziness and  headaches.  Endo/Heme/Allergies: Negative.   Psychiatric/Behavioral: Positive for depression (Stable) and substance abuse (Hx. Cocaine & opioid use disorder). Negative for hallucinations, memory loss and suicidal ideas. The patient has insomnia (Stable). The patient is not nervous/anxious (Stable).     Blood pressure (!) 123/99, pulse 84, temperature (!) 97.4 F (36.3 C), temperature source Oral, resp. rate 20, height 5\' 11"  (1.803 m), weight 59 kg, SpO2 100 %.Body mass index is 18.13 kg/m.  See Md's discharge SRA   Has this patient used any form of tobacco in the last 30 days? (Cigarettes, Smokeless Tobacco, Cigars, and/or Pipes): Yes, A prescription for an FDA-approved tobacco cessation medication was offered at discharge and the patient refused  Blood Alcohol level:  Lab Results  Component Value Date   [facility] <10 08/09/2018   ETH <10 16/04/9603   Metabolic Disorder Labs:  Lab Results  Component Value Date   HGBA1C 6.9 (H) 08/11/2018   MPG 151.33 08/11/2018   No results found for: PROLACTIN Lab Results  Component Value Date   CHOL 213 (H) 08/11/2018   TRIG 122 08/11/2018   HDL 46 08/11/2018   CHOLHDL 4.6 08/11/2018   VLDL 24 08/11/2018   LDLCALC 143 (H)  08/11/2018   [place] 82 07/09/2014   See Psychiatric Specialty Exam and Suicide Risk Assessment completed by Attending Physician prior to discharge.  Discharge destination:  Home  Is patient on multiple antipsychotic therapies at discharge:  No   Has Patient had three or more failed trials of antipsychotic monotherapy by history:  No  Recommended Plan for Multiple Antipsychotic Therapies: NA  Allergies as of 08/20/2018      Reactions   Fentanyl Nausea And Vomiting   Citalopram Other (See Comments)   Dizziness and shakiness.   Morphine And Related Nausea And Vomiting      Medication List    STOP taking these medications   amoxicillin-clavulanate 875-125 MG tablet Commonly known as:  AUGMENTIN     TAKE these medications     Indication  albuterol 108 (90 Base) MCG/ACT inhaler Commonly known as:  PROVENTIL HFA;VENTOLIN HFA Inhale 2 puffs into the lungs every 4 (four) hours as needed for wheezing or shortness of breath.  Indication:  Asthma   clobetasol cream 0.05 % Commonly known as:  TEMOVATE Apply topically 2 (two) times daily. For itching  Indication:  Itching   gabapentin 100 MG capsule Commonly known as:  NEURONTIN Take 2 capsules (200 mg total) by mouth 3 (three) times daily. For agitation  Indication:  Agitation   hydrOXYzine 25 MG tablet Commonly known as:  ATARAX/VISTARIL Take 1 tablet (25 mg total) by mouth 3 (three) times daily as needed for anxiety.  Indication:  Feeling Anxious   levothyroxine 175 MCG tablet Commonly known as:  SYNTHROID, LEVOTHROID Take 1 tablet (175 mcg total) by mouth daily before breakfast. For thyroid hormone replacement Start taking on:  August 21, 2018 What changed:  additional instructions  Indication:  Underactive Thyroid   metoprolol succinate 25 MG 24 hr tablet Commonly known as:  TOPROL-XL Take 1 tablet (25 mg total) by mouth daily. For high blood pressure Start taking on:  August 21, 2018  Indication:  High Blood  Pressure Disorder   mirtazapine 7.5 MG tablet Commonly known as:  REMERON Take 1 tablet (7.5 mg total) by mouth at bedtime. For sleep  Indication:  Insomnia,   pantoprazole 40 MG tablet Commonly known as:  PROTONIX Take 1 tablet (40 mg total) by mouth daily. For  acid reflux Start taking on:  August 21, 2018  Indication:  Gastroesophageal Reflux Disease   traZODone 150 MG tablet Commonly known as:  DESYREL Take 1 tablet (150 mg total) by mouth at bedtime as needed for sleep.  Indication:  Trouble Sleeping      Follow-up Information    Services, [facility] Follow up.   Why:  Screening for possible admission on Thursday, 08/22/2018 at 7:45AM. Please bring: proof of [place] residency/photo ID, 30 day supply of prescribed medications and 30 day prescriptions, and clothing. Thank you.  Contact information: [address] [place] [address] [phone]        [facility] Follow up on 08/26/2018.   Specialty:  Behavioral Health Why:  Hospital follow up appointment is 2/3 at 9:00a. Pleaase bring your photo ID, proof of insurance, SSN, current medications and discharge paperwork from this hospitalization.  Contact information: [place] [address] [phone]          Follow-up recommendations: Activity:  As tolerated Diet: As recommended by your primary care doctor. Keep all scheduled follow-up appointments as recommended.   Comments: Patient is instructed prior to discharge to: Take all medications as prescribed by his/her mental healthcare provider. Report any adverse effects and or reactions from the medicines to his/her outpatient provider promptly. Patient has been instructed & cautioned: To not engage in alcohol and or illegal drug use while on prescription medicines. In the event of worsening symptoms, patient is instructed to call the crisis hotline, 911 and or go to the nearest ED for appropriate evaluation and treatment of  symptoms. To follow-up with his/her primary care provider for your other medical issues, concerns and or health care needs.   Signed: Lindell Spar, NP, PMHNP, FNP-BC 08/20/2018, 9:32 AM

## 2018-08-20 NOTE — BHH Suicide Risk Assessment (Signed)
[facility] Discharge Suicide Risk Assessment   Principal Problem: Cocaine abuse with cocaine-induced mood disorder [facility]) Discharge Diagnoses: Principal Problem:   Cocaine abuse with cocaine-induced mood disorder ([place]) Active Problems:   Major depressive disorder, recurrent episode, severe ([place])   Total Time spent with patient: 30 minutes  Patient is alert and oriented and generally cooperative but irritable, he understands he has an interview at [facility], however this will be Thursday he states that he does not want to stay at the shelter but this is an option for him he understands that he cannot stay here further simply for housing purposes.  He is not suicidal he is not psychotic he has no withdrawal symptoms and there is no clinical justification for continued hospital care other than his request for housing.  Mental Status Per Nursing Assessment::   On Admission:  Suicidal ideation indicated by patient, Plan includes specific time, place, or method, Intention to act on suicide plan, Self-harm thoughts, Belief that plan would result in death, Suicide plan, Self-harm behaviors  Demographic Factors:  Male  Loss Factors: Decrease in vocational status  Historical Factors: NA  Risk Reduction Factors:   Religious beliefs about death  Continued Clinical Symptoms:  Alcohol/Substance Abuse/Dependencies  Cognitive Features That Contribute To Risk:  Closed-mindedness    Suicide Risk:  Minimal: No identifiable suicidal ideation.  Patients presenting with no risk factors but with morbid ruminations; may be classified as minimal risk based on the severity of the depressive symptoms  Follow-up Information    Services, [facility] Follow up.   Why:  Screening for possible admission on Thursday, 08/22/2018 at 7:45AM. Please bring: proof of [place] residency/photo ID, 30 day supply of prescribed medications and 30 day prescriptions, and clothing. Thank you.  Contact information: [address] [place] [address] [phone]        [facility] Follow up on 08/26/2018.   Specialty:  Behavioral Health Why:  Hospital follow up appointment is 2/3 at 9:00a. Pleaase bring your photo ID, proof of insurance, SSN, current medications and discharge paperwork from this hospitalization.  Contact information: [place] [address] [phone]           Plan Of Care/Follow-up recommendations:  Activity:  full  Vivianna Piccini, MD 08/20/2018, 8:59 AM

## 2018-08-20 NOTE — Progress Notes (Signed)
Patient ID: [name], male   DOB: [birth year], 53 y.o.   MRN: [id number]  Nursing Progress Note [id number]  Data: On initial approach, patient is seen resting in bed. Patient presents pleasant on approach. Patient compliant with scheduled medications. Patient denies pain/physical complaints. Patient completed self-inventory sheet and rates depression, hopelessness, and anxiety 10,10,10 respectively. Patient rates their sleep and appetite as good/good respectively. Patient states goal for today is to "get help". Patient states, "I have no where to go", but is agreeable to discharge plan. Patient is seen attending groups and visible in the milieu. Patient currently denies SI/HI/AVH.   Action: Patient is educated about and provided medication per provider's orders. Patient safety maintained with q15 min safety checks and frequent rounding. Low fall risk precautions in place. Emotional support given. 1:1 interaction and active listening provided. Patient encouraged to attend meals, groups, and work on treatment plan and goals. Labs, vital signs and patient behavior monitored throughout shift.   Response: Patient remains safe on the unit at this time and agrees to come to staff with any issues/concerns. Patient is interacting with peers appropriately on the unit. Will continue to support and monitor.

## 2018-08-20 NOTE — Progress Notes (Signed)
Patient ID: [name], male   DOB: 06-02-1966, 53 y.o.   MRN: [id number]  Discharge Note  D) Patient discharged to lobby. Patient states readiness for discharge. Patient denies SI/HI, AVH and is not delusional or psychotic.   A) Written and verbal discharge instructions given to the patient. Patient accepting to information and verbalized understanding. Patient agrees to the discharge plan. Opportunity for questions and concerns presented to patient. Patient denied any further questions or concerns. All belongings returned to patient. Patient signed for return of belongings and discharge paperwork. Patient has completed their Suicide Safety Plan and has been provided Suicide Prevention Education. Patient provided an opportunity to complete and return Patient Satisfaction Survey.   R) Patient safely escorted to the lobby. Patient discharged from [facility] with medication samples (16 day supply per pharmacist), prescriptions, bus passes, personal belongings, follow-up appointment in place and discharge paperwork.

## 2018-12-09 ENCOUNTER — Other Ambulatory Visit: Payer: Self-pay

## 2018-12-09 ENCOUNTER — Encounter (HOSPITAL_COMMUNITY): Payer: Self-pay | Admitting: Emergency Medicine

## 2018-12-09 ENCOUNTER — Emergency Department (HOSPITAL_COMMUNITY)
Admission: EM | Admit: 2018-12-09 | Discharge: 2018-12-09 | Disposition: A | Discharge status: Home / Self Care | Payer: Self-pay | Attending: Emergency Medicine | Admitting: Emergency Medicine

## 2018-12-09 DIAGNOSIS — E039 Hypothyroidism, unspecified: Secondary | ICD-10-CM | POA: Insufficient documentation

## 2018-12-09 DIAGNOSIS — E860 Dehydration: Secondary | ICD-10-CM | POA: Insufficient documentation

## 2018-12-09 LAB — CBC WITH DIFFERENTIAL/PLATELET
Abs Immature Granulocytes: 0.01 10*3/uL (ref 0.00–0.07)
Basophils Absolute: 0 10*3/uL (ref 0.0–0.1)
Basophils Relative: 1 %
Eosinophils Absolute: 0.2 10*3/uL (ref 0.0–0.5)
Eosinophils Relative: 4 %
HCT: 44.9 % (ref 39.0–52.0)
Hemoglobin: 14.9 g/dL (ref 13.0–17.0)
Immature Granulocytes: 0 %
Lymphocytes Relative: 26 %
Lymphs Abs: 1.4 10*3/uL (ref 0.7–4.0)
MCH: 31.2 pg (ref 26.0–34.0)
MCHC: 33.2 g/dL (ref 30.0–36.0)
MCV: 93.9 fL (ref 80.0–100.0)
Monocytes Absolute: 0.3 10*3/uL (ref 0.1–1.0)
Monocytes Relative: 5 %
Neutro Abs: 3.3 10*3/uL (ref 1.7–7.7)
Neutrophils Relative %: 64 %
Platelets: 162 10*3/uL (ref 150–400)
RBC: 4.78 MIL/uL (ref 4.22–5.81)
RDW: 13.6 % (ref 11.5–15.5)
WBC: 5.2 10*3/uL (ref 4.0–10.5)
nRBC: 0 % (ref 0.0–0.2)

## 2018-12-09 LAB — URINALYSIS, ROUTINE W REFLEX MICROSCOPIC
Bilirubin Urine: NEGATIVE
Glucose, UA: NEGATIVE mg/dL
Hgb urine dipstick: NEGATIVE
Ketones, ur: NEGATIVE mg/dL
Leukocytes,Ua: NEGATIVE
Nitrite: NEGATIVE
Protein, ur: NEGATIVE mg/dL
Specific Gravity, Urine: 1.018 (ref 1.005–1.030)
pH: 7 (ref 5.0–8.0)

## 2018-12-09 LAB — COMPREHENSIVE METABOLIC PANEL
ALT: 19 U/L (ref 0–44)
AST: 30 U/L (ref 15–41)
Albumin: 3.6 g/dL (ref 3.5–5.0)
Alkaline Phosphatase: 52 U/L (ref 38–126)
Anion gap: 11 (ref 5–15)
BUN: 18 mg/dL (ref 6–20)
CO2: 25 mmol/L (ref 22–32)
Calcium: 9.1 mg/dL (ref 8.9–10.3)
Chloride: 105 mmol/L (ref 98–111)
Creatinine, Ser: 1.81 mg/dL — ABNORMAL HIGH (ref 0.61–1.24)
GFR calc Af Amer: 48 mL/min — ABNORMAL LOW (ref >60)
GFR calc non Af Amer: 41 mL/min — ABNORMAL LOW (ref >60)
Glucose, Bld: 102 mg/dL — ABNORMAL HIGH (ref 70–99)
Potassium: 3.8 mmol/L (ref 3.5–5.1)
Sodium: 141 mmol/L (ref 135–145)
Total Bilirubin: 0.7 mg/dL (ref 0.3–1.2)
Total Protein: 6.1 g/dL — ABNORMAL LOW (ref 6.5–8.1)

## 2018-12-09 LAB — TSH: TSH: 126.256 u[IU]/mL — ABNORMAL HIGH (ref 0.350–4.500)

## 2018-12-09 LAB — T4, FREE: Free T4: <0.25 ng/dL — ABNORMAL LOW (ref 0.82–1.77)

## 2018-12-09 MED ORDER — METOPROLOL SUCCINATE ER 25 MG PO TB24: 25.0000 mg | ORAL_TABLET | Freq: Every day | ORAL | 0 refills | Status: DC

## 2018-12-09 MED ORDER — NICOTINE 21 MG/24HR TD PT24
21.0000 mg | MEDICATED_PATCH | Freq: Every day | TRANSDERMAL | Status: DC
  Administered 2018-12-09: 14:00:00 21 mg via TRANSDERMAL
  Filled 2018-12-09: qty 1

## 2018-12-09 MED ORDER — MIRTAZAPINE 7.5 MG PO TABS: 7.5000 mg | ORAL_TABLET | Freq: Every day | ORAL | 0 refills | Status: DC

## 2018-12-09 MED ORDER — LEVOTHYROXINE SODIUM 175 MCG PO TABS: 175.0000 ug | ORAL_TABLET | Freq: Every day | ORAL | 0 refills | Status: DC

## 2018-12-09 MED ORDER — PANTOPRAZOLE SODIUM 40 MG PO TBEC: 40.0000 mg | DELAYED_RELEASE_TABLET | Freq: Every day | ORAL | 0 refills | Status: DC

## 2018-12-09 MED ORDER — HYDROXYZINE HCL 25 MG PO TABS: 25.0000 mg | ORAL_TABLET | Freq: Three times a day (TID) | ORAL | 0 refills | Status: DC | PRN

## 2018-12-09 MED ORDER — ALBUTEROL SULFATE HFA 108 (90 BASE) MCG/ACT IN AERS: 2.0000 | INHALATION_SPRAY | RESPIRATORY_TRACT | 0 refills | Status: DC | PRN

## 2018-12-09 NOTE — ED Notes (Signed)
Patient verbalizes understanding of discharge instructions. Opportunity for questioning and answers were provided. Armband removed by staff, pt discharged from ED ambulatory to home.  

## 2018-12-09 NOTE — ED Triage Notes (Signed)
Pt here with complaints of facial swelling x1 week. Pt states he just starting taking his BP meds again.

## 2018-12-09 NOTE — ED Notes (Signed)
Patient asked for urine sample. States that he is unable to give sample at this time. 

## 2018-12-09 NOTE — Discharge Instructions (Addendum)
It is important that you take your medications as prescribed.  Follow up at the urgent care in 3 days for recheck of your kidney function. If worsened, you may need further evaluation.  Make sure you are stayin well hydrated with water. Follow up with primary care Return to the ER with any new, worsening, or concerning symptoms.

## 2018-12-09 NOTE — ED Provider Notes (Signed)
  Physical Exam  BP (!) 141/100   Pulse 61   Temp 98.6 F (37 C) (Oral)   Resp (!) 8   Ht 5\' 11"  (1.803 m)   Wt 77.1 kg   SpO2 100%   BMI 23.71 kg/m   Physical Exam  Gen: in nad  ED Course/Procedures   Clinical Course as of Dec 08 1752  Mon Dec 09, 2018  1437 TSH(!): 126.256 [AH]    Clinical Course User Index [AH] Margarita Mail, PA-C    Procedures  MDM   Patient signed out to me by Novella Olive, PA-C.  Please agree with test for further history.  In brief, patient presenting for periorbital, bilateral hand, bilateral foot swelling.  Patient has been out of his medication for several months.  Of note in the ED, TSH was 126 and T4 was undetectable.  Creatinine mildly elevated, but not far from patient's baseline.  Plan for patient to hydrate, and case management consult so that patient can get medications and plan for long-term care.  Case management saw the patient, will match patient's prescriptions and assist with placement with pcp.  Patient informed of results and plan.  Encourage patient to follow-up for creatinine recheck in 3 days to look for improvement.  Encouraged hydration at home, and importance of taking medications as prescribed.  At this time, patient appears safe for discharge.  Return precautions given.  Patient states he understanding agrees plan.      Franchot Heidelberg, PA-C 12/09/18 1756    Tegeler, Gwenyth Allegra, MD 12/10/18 [phone]

## 2018-12-09 NOTE — ED Provider Notes (Signed)
[facility] DEPARTMENT Provider Note   CSN: [id number] Arrival date & time: 12/09/18  1130    History   Chief Complaint No chief complaint on file.   HPI [name] is a very pleasant 53 y.o. male with a past medical history of laryngeal cancer, polysubstance abuse-sober since January 2020 and homelessness.  He is a daily smoker.  Patient presents the emergency complaint of swelling in his eyes hands and feet.  This is been going on for about a week.  The patient has a history of hypertension and hypothyroidism.  He has been out of his medication since the beginning of the pandemic secondary to lack of insurance, lack of money and homelessness.  States that he has been on thyroid medication, Synthroid, since he had his throat cancer.  He is not sure if he has damage from the radiation.  He denies chest pain, abdominal swelling.  He does not drink alcohol.  He denies orthopnea or PND.  Swelling in his legs does not improve when he lies down.     HPI  Past Medical History:  Diagnosis Date  . Headache(784.0)   . Heart murmur    child  . Hx of radiation therapy 05/19/13- 06/27/13   glottic larynx, right true vocal cord 6300 cGy 28 sessions  . Hypothyroidism   . Polysubstance abuse ([place])   . Shortness of breath   . 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 Major depressive disorder, recurrent episode, severe ([place]) 08/14/2018  . Cocaine abuse with cocaine-induced mood disorder ([place]) 08/10/2018  . Severe sepsis ([place]) 08/02/2018  . Left lower lobe pneumonia ([place]) 08/02/2018  . AKI (acute kidney injury) ([place]) 08/02/2018  . Influenza B 08/01/2018  . Fatigue 04/16/2018  . Polysubstance dependence including opioid type drug, episodic abuse, with delirium ([place]) 03/22/2017  . Acute respiratory failure with hypoxemia ([place]) 03/19/2017  . Homelessness  10/25/2016  . Long term (current) use of opiate analgesic 10/11/2016  . Emphysema of lung ([place]) 12/18/2014  . Shortness of breath 11/12/2014  . Esophageal dysphagia 09/10/2014  . Tobacco use disorder 09/10/2014  . Routine health maintenance 07/09/2014  . Hypothyroidism due to acquired atrophy of thyroid 06/12/2014  . GERD (gastroesophageal reflux disease) 06/12/2014  . Hx of radiation therapy   . 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 05/06/2013  . Hoarseness of voice 03/17/2013    Past Surgical History:  Procedure Laterality Date  . BALLOON DILATION N/A 12/30/2013   Procedure: BALLOON DILATION;  Surgeon: Garlan Fair, MD;  Location: [facility];  Service: Endoscopy;  Laterality: N/A;  . DIRECT LARYNGOSCOPY Right 04/24/2013   Procedure: DIRECT LARYNGOSCOPY and BIOPSY OF TONGUE;  Surgeon: Jerrell Belfast, MD;  Location: [facility];  Service: ENT;  Laterality: Right;  . ESOPHAGEAL MANOMETRY N/A 09/07/2014   Procedure: ESOPHAGEAL MANOMETRY (EM);  Surgeon: Garlan Fair, MD;  Location: [facility];  Service: Endoscopy;  Laterality: N/A;  . ESOPHAGOGASTRODUODENOSCOPY (EGD) WITH PROPOFOL N/A 12/30/2013   Procedure: ESOPHAGOGASTRODUODENOSCOPY (EGD) WITH PROPOFOL;  Surgeon: Garlan Fair, MD;  Location: [facility];  Service: Endoscopy;  Laterality: N/A;  . MICROLARYNGOSCOPY Right 04/24/2013   Procedure: MICROLARYNGOSCOPY and RIGHT VOCAL CORD BIOPSY;  Surgeon: Jerrell Belfast, MD;  Location: [facility];  Service: ENT;  Laterality: Right;  . MULTIPLE EXTRACTIONS WITH ALVEOLOPLASTY N/A 09/30/2013  Procedure: Extraction of tooth #'s 2,3,4,5,6,7,8,9,10,11,12,15,22,23,24,25,26,27,28 with alveoloplasty;  Surgeon: Lenn Cal, DDS;  Location: [facility];  Service: Oral Surgery;  Laterality: N/A;  . TONSILLECTOMY     as a child  . wisdon teeth     age 15        Home Medications    Prior to Admission medications   Medication Sig Start Date End Date Taking?  Authorizing Provider  albuterol (PROVENTIL HFA;VENTOLIN HFA) 108 (90 Base) MCG/ACT inhaler Inhale 2 puffs into the lungs every 4 (four) hours as needed for wheezing or shortness of breath. 08/20/18   Lindell Spar I, NP  clobetasol cream (TEMOVATE) 0.05 % Apply topically 2 (two) times daily. For itching 08/20/18   Lindell Spar I, NP  gabapentin (NEURONTIN) 100 MG capsule Take 2 capsules (200 mg total) by mouth 3 (three) times daily. For agitation 08/20/18   Lindell Spar I, NP  hydrOXYzine (ATARAX/VISTARIL) 25 MG tablet Take 1 tablet (25 mg total) by mouth 3 (three) times daily as needed for anxiety. 08/20/18   Lindell Spar I, NP  levothyroxine (SYNTHROID, LEVOTHROID) 175 MCG tablet Take 1 tablet (175 mcg total) by mouth daily before breakfast. For thyroid hormone replacement 08/21/18   Lindell Spar I, NP  metoprolol succinate (TOPROL-XL) 25 MG 24 hr tablet Take 1 tablet (25 mg total) by mouth daily. For high blood pressure 08/21/18   Nwoko, Herbert Pun I, NP  mirtazapine (REMERON) 7.5 MG tablet Take 1 tablet (7.5 mg total) by mouth at bedtime. For sleep 08/20/18   Lindell Spar I, NP  pantoprazole (PROTONIX) 40 MG tablet Take 1 tablet (40 mg total) by mouth daily. For acid reflux 08/21/18   Lindell Spar I, NP  traZODone (DESYREL) 150 MG tablet Take 1 tablet (150 mg total) by mouth at bedtime as needed for sleep. 08/20/18   Encarnacion Slates, NP    Family History Family History  Problem Relation Age of Onset  . COPD Mother   . Emphysema Mother   . Heart attack Father     Social History Social History   Tobacco Use  . Smoking status: Current Every Day Smoker    Packs/day: 1.00    Years: 34.00    Pack years: 34.00    Types: Cigarettes  . Smokeless tobacco: Never Used  . Tobacco comment: DOWN TO 4 - 5 CIGARETTES A DAY  Substance Use Topics  . Alcohol use: No    Alcohol/week: 0.0 standard drinks  . Drug use: Yes    Types: Marijuana, Cocaine, Heroin    Comment: Heroin     Allergies   Fentanyl;  Citalopram; and Morphine and related   Review of Systems Review of Systems   Physical Exam Updated Vital Signs BP (!) 142/92 (BP Location: Right Arm)   Pulse 76   Temp 98.6 F (37 C) (Oral)   Resp 16   Ht 5\' 11"  (1.803 m)   Wt 77.1 kg   SpO2 97%   BMI 23.71 kg/m   Physical Exam Vitals signs and nursing note reviewed.  Constitutional:      General: He is not in acute distress.    Appearance: He is well-developed. He is not diaphoretic.  HENT:     Head: Normocephalic and atraumatic.     Comments: Periorbital edema Eyes:     General: No scleral icterus.    Conjunctiva/sclera: Conjunctivae normal.  Neck:     Musculoskeletal: Normal range of motion and neck supple.  Cardiovascular:  Rate and Rhythm: Normal rate and regular rhythm.     Heart sounds: Normal heart sounds.  Pulmonary:     Effort: Pulmonary effort is normal. No respiratory distress.     Breath sounds: Normal breath sounds.  Abdominal:     Palpations: Abdomen is soft.     Tenderness: There is no abdominal tenderness.  Musculoskeletal:     Comments: No appreciable edema in the hands but patient reports that he feels tight with grip strength Trace pitting edema in the bilateral lower extremities  Skin:    General: Skin is warm and dry.  Neurological:     Mental Status: He is alert.  Psychiatric:        Behavior: Behavior normal.      ED Treatments / Results  Labs (all labs ordered are listed, but only abnormal results are displayed) Labs Reviewed  COMPREHENSIVE METABOLIC PANEL  CBC WITH DIFFERENTIAL/PLATELET  URINALYSIS, ROUTINE W REFLEX MICROSCOPIC  TSH  T4, FREE    EKG None  Radiology No results found.  Procedures Procedures (including critical care time)  Medications Ordered in ED Medications  nicotine (NICODERM CQ - dosed in mg/24 hours) patch 21 mg (has no administration in time range)     Initial Impression / Assessment and Plan / ED Course  I have reviewed the triage  vital signs and the nursing notes.  Pertinent labs & imaging results that were available during my care of the patient were reviewed by me and considered in my medical decision making (see chart for details).  Clinical Course as of Dec 08 1540  Mon Dec 09, 2018  1437 TSH(!): 150.569 [AH]    Clinical Course User Index [AH] Margarita Mail, PA-C       53 year old male with history of homelessness, hypothyroidism, hypertension.  Review of his labs show markedly elevated TSH and undetectable T4.  Patient has not been taking his medications.  Patient's urine negative for protein or infection.  His CMP shows elevated creatinine up from 1.18.  Think this is secondary to his hypertension and not taking his medication for several months.  He has had elevated creatinines in the past.  I not feel this requires admission however I do feel he needs his medications, close outpatient monitoring and likely return visit for check of his creatinine in the next 2 to 3 days.  I have a consult out to case management has not yet contacted me.  I have given signout to [place] will assume care of the patient.  He is stable throughout his visit.  Final Clinical Impressions(s) / ED Diagnoses   Final diagnoses:  None    ED Discharge Orders    None       Margarita Mail, PA-C 12/09/18 [place], [place], DO 12/10/18 [phone]

## 2018-12-10 ENCOUNTER — Telehealth: Payer: Self-pay | Admitting: Surgery

## 2018-12-10 NOTE — Telephone Encounter (Signed)
Open this encounter in error.

## 2019-05-13 ENCOUNTER — Other Ambulatory Visit: Payer: Self-pay

## 2019-05-13 ENCOUNTER — Emergency Department (HOSPITAL_COMMUNITY)
Admission: EM | Admit: 2019-05-13 | Discharge: 2019-05-13 | Disposition: A | Discharge status: Home / Self Care | Payer: Self-pay | Attending: Emergency Medicine | Admitting: Emergency Medicine

## 2019-05-13 ENCOUNTER — Encounter (HOSPITAL_COMMUNITY): Payer: Self-pay | Admitting: *Deleted

## 2019-05-13 DIAGNOSIS — Z79899 Other long term (current) drug therapy: Secondary | ICD-10-CM | POA: Insufficient documentation

## 2019-05-13 DIAGNOSIS — R202 Paresthesia of skin: Secondary | ICD-10-CM | POA: Insufficient documentation

## 2019-05-13 DIAGNOSIS — J449 Chronic obstructive pulmonary disease, unspecified: Secondary | ICD-10-CM | POA: Insufficient documentation

## 2019-05-13 DIAGNOSIS — F17210 Nicotine dependence, cigarettes, uncomplicated: Secondary | ICD-10-CM | POA: Insufficient documentation

## 2019-05-13 DIAGNOSIS — Z760 Encounter for issue of repeat prescription: Secondary | ICD-10-CM | POA: Insufficient documentation

## 2019-05-13 DIAGNOSIS — Z8521 Personal history of malignant neoplasm of larynx: Secondary | ICD-10-CM | POA: Insufficient documentation

## 2019-05-13 DIAGNOSIS — E039 Hypothyroidism, unspecified: Secondary | ICD-10-CM | POA: Insufficient documentation

## 2019-05-13 DIAGNOSIS — N189 Chronic kidney disease, unspecified: Secondary | ICD-10-CM | POA: Insufficient documentation

## 2019-05-13 HISTORY — DX: Chronic obstructive pulmonary disease, unspecified: J44.9

## 2019-05-13 LAB — COMPREHENSIVE METABOLIC PANEL
ALT: 61 U/L — ABNORMAL HIGH (ref 0–44)
AST: 59 U/L — ABNORMAL HIGH (ref 15–41)
Albumin: 3.9 g/dL (ref 3.5–5.0)
Alkaline Phosphatase: 42 U/L (ref 38–126)
Anion gap: 10 (ref 5–15)
BUN: 18 mg/dL (ref 6–20)
CO2: 24 mmol/L (ref 22–32)
Calcium: 9.1 mg/dL (ref 8.9–10.3)
Chloride: 106 mmol/L (ref 98–111)
Creatinine, Ser: 1.95 mg/dL — ABNORMAL HIGH (ref 0.61–1.24)
GFR calc Af Amer: 44 mL/min — ABNORMAL LOW (ref >60)
GFR calc non Af Amer: 38 mL/min — ABNORMAL LOW (ref >60)
Glucose, Bld: 100 mg/dL — ABNORMAL HIGH (ref 70–99)
Potassium: 3.8 mmol/L (ref 3.5–5.1)
Sodium: 140 mmol/L (ref 135–145)
Total Bilirubin: 0.9 mg/dL (ref 0.3–1.2)
Total Protein: 6.5 g/dL (ref 6.5–8.1)

## 2019-05-13 LAB — CBC WITH DIFFERENTIAL/PLATELET
Abs Immature Granulocytes: 0.01 10*3/uL (ref 0.00–0.07)
Basophils Absolute: 0 10*3/uL (ref 0.0–0.1)
Basophils Relative: 1 %
Eosinophils Absolute: 0.1 10*3/uL (ref 0.0–0.5)
Eosinophils Relative: 5 %
HCT: 41.4 % (ref 39.0–52.0)
Hemoglobin: 13.6 g/dL (ref 13.0–17.0)
Immature Granulocytes: 0 %
Lymphocytes Relative: 33 %
Lymphs Abs: 1 10*3/uL (ref 0.7–4.0)
MCH: 32.5 pg (ref 26.0–34.0)
MCHC: 32.9 g/dL (ref 30.0–36.0)
MCV: 99 fL (ref 80.0–100.0)
Monocytes Absolute: 0.2 10*3/uL (ref 0.1–1.0)
Monocytes Relative: 6 %
Neutro Abs: 1.7 10*3/uL (ref 1.7–7.7)
Neutrophils Relative %: 55 %
Platelets: 158 10*3/uL (ref 150–400)
RBC: 4.18 MIL/uL — ABNORMAL LOW (ref 4.22–5.81)
RDW: 15 % (ref 11.5–15.5)
WBC: 3 10*3/uL — ABNORMAL LOW (ref 4.0–10.5)
nRBC: 0 % (ref 0.0–0.2)

## 2019-05-13 LAB — MAGNESIUM: Magnesium: 2.2 mg/dL (ref 1.7–2.4)

## 2019-05-13 MED ORDER — METOPROLOL SUCCINATE ER 25 MG PO TB24: 25.0000 mg | ORAL_TABLET | Freq: Every day | ORAL | 2 refills | Status: DC

## 2019-05-13 MED ORDER — SODIUM CHLORIDE 0.9 % IV BOLUS
500.0000 mL | Freq: Once | INTRAVENOUS | Status: AC
  Administered 2019-05-13: 10:00:00 500 mL via INTRAVENOUS

## 2019-05-13 MED ORDER — SODIUM CHLORIDE 0.9 % IV BOLUS
500.0000 mL | Freq: Once | INTRAVENOUS | Status: AC
  Administered 2019-05-13: 11:00:00 500 mL via INTRAVENOUS

## 2019-05-13 MED ORDER — LEVOTHYROXINE SODIUM 25 MCG PO TABS
175.0000 ug | ORAL_TABLET | Freq: Once | ORAL | Status: AC
  Administered 2019-05-13: 175 ug via ORAL
  Filled 2019-05-13: qty 1

## 2019-05-13 MED ORDER — LEVOTHYROXINE SODIUM 175 MCG PO TABS: 175.0000 ug | ORAL_TABLET | Freq: Every day | ORAL | 2 refills | Status: DC

## 2019-05-13 MED FILL — LEVOTHYROXINE 175 MCG TAB: 175 | 30 days supply | Qty: 30 | Fill #0

## 2019-05-13 MED FILL — METOPROLOL SUCCINATE ER 25: 25 | 30 days supply | Qty: 30 | Fill #0

## 2019-05-13 NOTE — Discharge Planning (Signed)
RN CM ED: [facility] letter reinstated with copay override.

## 2019-05-13 NOTE — ED Triage Notes (Signed)
Pt reports swelling of feet and legs for about 2 weeks, states he ran out of his meds and can not find a doctor.

## 2019-05-13 NOTE — ED Provider Notes (Signed)
[facility] Provider Note   CSN: [id number] Arrival date & time: 05/13/19  [id number]     History   Chief Complaint Chief Complaint  Patient presents with  . Leg Swelling  . Foot Swelling    HPI [name] is a 53 y.o. male.     HPI 53 year old male presents requesting medication refill.  He has been out of all of his meds for at least a month.  He has no PCP to follow-up with for refills.  He states he is supposed to be on thyroid medicine which is the most important one he wants refilled.  Otherwise he is also out of all of his other meds.  He has noticed tingling to bilateral hands and feet for the last few weeks.  He denies a headache or chest pain.  He feels chronically short of breath with a chronic cough from COPD but no new or worsening symptoms.  Past Medical History:  Diagnosis Date  . COPD (chronic obstructive pulmonary disease) ([place])   . Headache(784.0)   . Heart murmur    child  . Hx of radiation therapy 05/19/13- 06/27/13   glottic larynx, right true vocal cord 6300 cGy 28 sessions  . Hypothyroidism   . Polysubstance abuse ([place])   . Shortness of breath   . 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 Major depressive disorder, recurrent episode, severe ([place]) 08/14/2018  . Cocaine abuse with cocaine-induced mood disorder ([place]) 08/10/2018  . Severe sepsis ([place]) 08/02/2018  . Left lower lobe pneumonia 08/02/2018  . AKI (acute kidney injury) ([place]) 08/02/2018  . Influenza B 08/01/2018  . Fatigue 04/16/2018  . Polysubstance dependence including opioid type drug, episodic abuse, with delirium ([place]) 03/22/2017  . Acute respiratory failure with hypoxemia ([place]) 03/19/2017  . Homelessness 10/25/2016  . Long term (current) use of opiate analgesic 10/11/2016  . Emphysema of lung ([place]) 12/18/2014  . Shortness of breath 11/12/2014   . Esophageal dysphagia 09/10/2014  . Tobacco use disorder 09/10/2014  . Routine health maintenance 07/09/2014  . Hypothyroidism due to acquired atrophy of thyroid 06/12/2014  . GERD (gastroesophageal reflux disease) 06/12/2014  . Hx of radiation therapy   . 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 05/06/2013  . Hoarseness of voice 03/17/2013    Past Surgical History:  Procedure Laterality Date  . BALLOON DILATION N/A 12/30/2013   Procedure: BALLOON DILATION;  Surgeon: Garlan Fair, MD;  Location: [facility];  Service: Endoscopy;  Laterality: N/A;  . DIRECT LARYNGOSCOPY Right 04/24/2013   Procedure: DIRECT LARYNGOSCOPY and BIOPSY OF TONGUE;  Surgeon: Jerrell Belfast, MD;  Location: [facility];  Service: ENT;  Laterality: Right;  . ESOPHAGEAL MANOMETRY N/A 09/07/2014   Procedure: ESOPHAGEAL MANOMETRY (EM);  Surgeon: Garlan Fair, MD;  [facility]: [facility];  Service: Endoscopy;  Laterality: N/A;  . ESOPHAGOGASTRODUODENOSCOPY (EGD) WITH PROPOFOL N/A 12/30/2013   Procedure: ESOPHAGOGASTRODUODENOSCOPY (EGD) WITH PROPOFOL;  Surgeon: Garlan Fair, MD;  [facility];  Service: Endoscopy;  Laterality: N/A;  . MICROLARYNGOSCOPY Right 04/24/2013   Procedure: MICROLARYNGOSCOPY and RIGHT VOCAL CORD BIOPSY;  Surgeon: Jerrell Belfast, MD;  Location: [facility];  Service: ENT;  Laterality: Right;  . MULTIPLE EXTRACTIONS WITH ALVEOLOPLASTY N/A 09/30/2013   Procedure: Extraction of tooth #'s 2,3,4,5,6,7,8,9,10,11,12,15,22,23,24,25,26,27,28 with alveoloplasty;  Surgeon: Lenn Cal, DDS;  Location: [facility];  Service: Oral Surgery;  Laterality: N/A;  . TONSILLECTOMY     as a child  . wisdon teeth     age 68        Home Medications    Prior to Admission medications   Medication Sig Start Date End Date Taking? Authorizing Provider  albuterol (VENTOLIN HFA) 108 (90 Base) MCG/ACT inhaler Inhale 2 puffs into the lungs every 4 (four) hours as needed for  wheezing or shortness of breath. 12/09/18   [place], Sophia, PA-C  clobetasol cream (TEMOVATE) 0.05 % Apply topically 2 (two) times daily. For itching 08/20/18   Lindell Spar I, NP  gabapentin (NEURONTIN) 100 MG capsule Take 2 capsules (200 mg total) by mouth 3 (three) times daily. For agitation 08/20/18   Lindell Spar I, NP  hydrOXYzine (ATARAX/VISTARIL) 25 MG tablet Take 1 tablet (25 mg total) by mouth 3 (three) times daily as needed for anxiety. 12/09/18   Caccavale, Sophia, PA-C  levothyroxine (SYNTHROID) 175 MCG tablet Take 1 tablet (175 mcg total) by mouth daily before breakfast. For thyroid hormone replacement 05/13/19   Sherwood Gambler, MD  metoprolol succinate (TOPROL-XL) 25 MG 24 hr tablet Take 1 tablet (25 mg total) by mouth daily. For high blood pressure 05/13/19   Sherwood Gambler, MD  mirtazapine (REMERON) 7.5 MG tablet Take 1 tablet (7.5 mg total) by mouth at bedtime. For sleep 12/09/18   Caccavale, Sophia, PA-C  pantoprazole (PROTONIX) 40 MG tablet Take 1 tablet (40 mg total) by mouth daily. For acid reflux 12/09/18   Caccavale, Sophia, PA-C  traZODone (DESYREL) 150 MG tablet Take 1 tablet (150 mg total) by mouth at bedtime as needed for sleep. 08/20/18   Encarnacion Slates, NP    Family History Family History  Problem Relation Age of Onset  . COPD Mother   . Emphysema Mother   . Heart attack Father     Social History Social History   Tobacco Use  . Smoking status: Current Every Day Smoker    Packs/day: 1.00    Years: 34.00    Pack years: 34.00    Types: Cigarettes  . Smokeless tobacco: Never Used  . Tobacco comment: DOWN TO 4 - 5 CIGARETTES A DAY  Substance Use Topics  . Alcohol use: No    Alcohol/week: 0.0 standard drinks  . Drug use: Yes    Types: Marijuana, Cocaine, Heroin    Comment: Heroin     Allergies   Fentanyl, Citalopram, and Morphine and related   Review of Systems Review of Systems  Constitutional: Negative for fever.  Respiratory: Positive for cough  and shortness of breath.   Cardiovascular: Negative for chest pain.  Neurological: Negative for weakness and headaches.  All other systems reviewed and are negative.    Physical Exam Updated Vital Signs BP 118/88 (BP Location: Right Arm)   Pulse 77   Temp 98 F (36.7 C) (Oral)   Resp 18   Ht 5\' 11"  (1.803 m)   Wt 63.5 kg   SpO2 99%   BMI 19.53 kg/m   Physical Exam Vitals signs and nursing note reviewed.  Constitutional:      General: He is not in acute distress.    Appearance: He is well-developed. He is not ill-appearing or diaphoretic.  HENT:     Head: Normocephalic and atraumatic.     Right Ear: External ear normal.     Left Ear: External ear normal.     Nose: Nose normal.  Eyes:  General:        Right eye: No discharge.        Left eye: No discharge.  Neck:     Musculoskeletal: Neck supple.  Cardiovascular:     Rate and Rhythm: Normal rate and regular rhythm.     Pulses:          Radial pulses are 2+ on the right side and 2+ on the left side.       Dorsalis pedis pulses are 2+ on the right side and 2+ on the left side.     Heart sounds: Normal heart sounds.  Pulmonary:     Effort: Pulmonary effort is normal.     Breath sounds: Normal breath sounds.  Abdominal:     Palpations: Abdomen is soft.     Tenderness: There is no abdominal tenderness.  Musculoskeletal:     Right lower leg: No edema.     Left lower leg: No edema.  Skin:    General: Skin is warm and dry.  Neurological:     Mental Status: He is alert.     Comments: 5/5 strength in all 4 extremities. Normal gross sensation   Psychiatric:        Mood and Affect: Mood is not anxious.      ED Treatments / Results  Labs (all labs ordered are listed, but only abnormal results are displayed) Labs Reviewed  COMPREHENSIVE METABOLIC PANEL - Abnormal; Notable for the following components:      Result Value   Glucose, Bld 100 (*)    Creatinine, Ser 1.95 (*)    AST 59 (*)    ALT 61 (*)    GFR calc  non Af Amer 38 (*)    GFR calc Af Amer 44 (*)    All other components within normal limits  CBC WITH DIFFERENTIAL/PLATELET - Abnormal; Notable for the following components:   WBC 3.0 (*)    RBC 4.18 (*)    All other components within normal limits  MAGNESIUM    EKG None  Radiology No results found.  Procedures Procedures (including critical care time)  Medications Ordered in ED Medications  sodium chloride 0.9 % bolus 500 mL (0 mLs Intravenous Stopped 05/13/19 1117)  levothyroxine (SYNTHROID) tablet 175 mcg (175 mcg Oral Given 05/13/19 1046)  sodium chloride 0.9 % bolus 500 mL (0 mLs Intravenous Stopped 05/13/19 1117)     Initial Impression / Assessment and Plan / ED Course  I have reviewed the triage vital signs and the nursing notes.  Pertinent labs & imaging results that were available during my care of the patient were reviewed by me and considered in my medical decision making (see chart for details).        Labs are near baseline from a few months ago.  Creatinine is nearly 1.8 from last time.  He was given some fluids.  Overall, his complaints are subacute and peers similar to when he has missed his thyroid medicine in the past.  At this point he only wants thyroid refill and I will also give the metoprolol refill.  Case management and social work involved and will help with this.  He was given refills but encouraged he needs to follow-up with PCP.  Despite the triage complaint of leg swelling there is no leg edema/swelling complaint.  Appears stable for discharge.  Final Clinical Impressions(s) / ED Diagnoses   Final diagnoses:  Medication refill  Hypothyroidism, unspecified type  Chronic kidney disease, unspecified CKD stage  ED Discharge Orders         Ordered    metoprolol succinate (TOPROL-XL) 25 MG 24 hr tablet  Daily     05/13/19 1059    levothyroxine (SYNTHROID) 175 MCG tablet  Daily before breakfast     05/13/19 1059           Sherwood Gambler, MD 05/13/19 1828

## 2019-05-13 NOTE — Progress Notes (Signed)
CSW spoke with patient via bedside regarding needing assistance with his medication and getting a PCP. Patient reports he really needs his thyroid medication (levothyroxine (SYNTHROID) tablet 175 mcg) and could benefit from getting his (metoprolol succinate (TOPROL-XL) 25 MG 24 hr tablet). Patient reports he went to a [facility] clinic (could not recall which one) for PCP visits in the past, but was dismissed after missing too many appointment. Patient reports he finds it difficult to make it to appointments as he is homeless. CSW offered homeless resources as well, but patient says he is happy in his tent away from "all those people."  CSW spoke with EDP who is agreeable to write his Rx. CSW consulted RN CM for assistance with [facility] letter. RN CM recommended patient go to the [facility] to see their medical providers versus using an appointment at [facility] that patient may miss.   [place], LCSW Transitions of Care Department [facility] ED [phone]

## 2019-05-28 ENCOUNTER — Ambulatory Visit: Payer: Self-pay | Attending: Family Medicine | Admitting: Licensed Clinical Social Worker

## 2019-05-28 ENCOUNTER — Other Ambulatory Visit: Payer: Self-pay

## 2019-05-28 ENCOUNTER — Ambulatory Visit: Payer: Self-pay | Attending: Family Medicine | Admitting: Physician Assistant

## 2019-05-28 VITALS — BP 135/98 | HR 80 | Temp 98.1°F | Ht 71.0 in | Wt 164.2 lb

## 2019-05-28 DIAGNOSIS — G4700 Insomnia, unspecified: Secondary | ICD-10-CM

## 2019-05-28 DIAGNOSIS — I10 Essential (primary) hypertension: Secondary | ICD-10-CM

## 2019-05-28 DIAGNOSIS — Z5900 Homelessness unspecified: Secondary | ICD-10-CM

## 2019-05-28 DIAGNOSIS — R0602 Shortness of breath: Secondary | ICD-10-CM

## 2019-05-28 DIAGNOSIS — Z09 Encounter for follow-up examination after completed treatment for conditions other than malignant neoplasm: Secondary | ICD-10-CM

## 2019-05-28 DIAGNOSIS — R202 Paresthesia of skin: Secondary | ICD-10-CM

## 2019-05-28 DIAGNOSIS — R5383 Other fatigue: Secondary | ICD-10-CM

## 2019-05-28 DIAGNOSIS — F419 Anxiety disorder, unspecified: Secondary | ICD-10-CM

## 2019-05-28 DIAGNOSIS — E034 Atrophy of thyroid (acquired): Secondary | ICD-10-CM

## 2019-05-28 MED ORDER — GABAPENTIN 300 MG PO CAPS: 300.0000 mg | ORAL_CAPSULE | Freq: Three times a day (TID) | ORAL | 3 refills | Status: DC

## 2019-05-28 MED ORDER — TRAZODONE HCL 150 MG PO TABS: 150.0000 mg | ORAL_TABLET | Freq: Every evening | ORAL | 3 refills | Status: DC | PRN

## 2019-05-28 MED ORDER — ALBUTEROL SULFATE HFA 108 (90 BASE) MCG/ACT IN AERS: 2.0000 | INHALATION_SPRAY | RESPIRATORY_TRACT | 1 refills | Status: DC | PRN

## 2019-05-28 MED ORDER — FLUTICASONE-SALMETEROL 250-50 MCG/DOSE IN AEPB: 1.0000 | INHALATION_SPRAY | Freq: Two times a day (BID) | RESPIRATORY_TRACT | 3 refills | Status: DC

## 2019-05-28 MED ORDER — METOPROLOL SUCCINATE ER 25 MG PO TB24: 25.0000 mg | ORAL_TABLET | Freq: Every day | ORAL | 2 refills | Status: DC

## 2019-05-28 MED ORDER — LEVOTHYROXINE SODIUM 175 MCG PO TABS: 175.0000 ug | ORAL_TABLET | Freq: Every day | ORAL | 2 refills | Status: DC

## 2019-05-28 MED ORDER — MIRTAZAPINE 7.5 MG PO TABS: 7.5000 mg | ORAL_TABLET | Freq: Every day | ORAL | 0 refills | Status: DC

## 2019-05-28 MED ORDER — HYDROXYZINE HCL 25 MG PO TABS: 25.0000 mg | ORAL_TABLET | Freq: Three times a day (TID) | ORAL | 0 refills | Status: DC | PRN

## 2019-05-28 MED FILL — !VENTOLIN HFA INHALER: 108 (90 BAS | 18 days supply | Qty: 18 | Fill #0

## 2019-05-28 MED FILL — !ADVAIR 250/50 DISKUS: 250-50 | 30 days supply | Qty: 60 | Fill #0

## 2019-05-28 MED FILL — GABAPENTIN 300 MG CAPSULE: 300 | 30 days supply | Qty: 90 | Fill #0

## 2019-05-28 MED FILL — hydrOXYzine HCL 25 MG TABS: 25 | 20 days supply | Qty: 60 | Fill #0

## 2019-05-28 MED FILL — MIRTAZAPINE 7.5 MG TABLET: 7.5 | 30 days supply | Qty: 30 | Fill #0

## 2019-05-28 MED FILL — TRAZODONE HCL 150 MG TABS: 150 | 30 days supply | Qty: 30 | Fill #0

## 2019-05-28 NOTE — Progress Notes (Signed)
Patient ID: [name], male   DOB: [birth year], 53 y.o.   MRN: [id number]   [name], is a 53 y.o. male  [id number]  [id number]  DOB - 1966/02/07  Subjective:  Chief Complaint and HPI: [name] is a 53 y.o. male here to establish care and as hospital follow up.  He went to ED for med RF.  Homeless.  Out of thyroid meds ~ 1 month before restarting thyroid meds on 05/13/2019.  Denies drinking much alcohol.  C/o paresthesias in fingers and toes not controlled on gabapentin 200mg  tid.  Also severe fatigue.  Poor breathing.  Still smoking but trying to cut back.  H/o thyroid CA s/p radiation.     ROS:   Constitutional:  No f/c, No night sweats, No unexplained weight loss. EENT:  No vision changes, No blurry vision, No hearing changes. No mouth, throat, or ear problems.  Respiratory: No cough, + SOB Cardiac: No CP, no palpitations GI:  No abd pain, No N/V/D. GU: No Urinary s/sx Musculoskeletal: No joint pain Neuro: No headache, no dizziness, no motor weakness.  Skin: No rash Endocrine:  No polydipsia. No polyuria.  Psych: Denies SI/HI  No problems updated.  ALLERGIES: Allergies  Allergen Reactions  . Fentanyl Nausea And Vomiting  . Citalopram Other (See Comments)    Dizziness and shakiness.  . Morphine And Related Nausea And Vomiting    PAST MEDICAL HISTORY: Past Medical History:  Diagnosis Date  . COPD (chronic obstructive pulmonary disease) ([place])   . Headache(784.0)   . Heart murmur    child  . Hx of radiation therapy 05/19/13- 06/27/13   glottic larynx, right true vocal cord 6300 cGy 28 sessions  . Hypothyroidism   . Polysubstance abuse ([place])   . Shortness of breath   . 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 Authorizing Provider  albuterol (VENTOLIN HFA) 108 (90 Base) MCG/ACT inhaler Inhale 2  puffs into the lungs every 4 (four) hours as needed for wheezing or shortness of breath. 12/09/18  Yes Caccavale, Sophia, PA-C  hydrOXYzine (ATARAX/VISTARIL) 25 MG tablet Take 1 tablet (25 mg total) by mouth 3 (three) times daily as needed for anxiety. 05/28/19  Yes Argentina Donovan, PA-C  levothyroxine (SYNTHROID) 175 MCG tablet Take 1 tablet (175 mcg total) by mouth daily before breakfast. For thyroid hormone replacement 05/28/19  Yes Freeman Caldron M, PA-C  metoprolol succinate (TOPROL-XL) 25 MG 24 hr tablet Take 1 tablet (25 mg total) by mouth daily. For high blood pressure 05/28/19  Yes Lelend Heinecke M, PA-C  mirtazapine (REMERON) 7.5 MG tablet Take 1 tablet (7.5 mg total) by mouth at bedtime. For sleep 05/28/19  Yes Argentina Donovan, PA-C  traZODone (DESYREL) 150 MG tablet Take 1 tablet (150 mg total) by mouth at bedtime as needed for sleep. 05/28/19  Yes Freeman Caldron M, PA-C  clobetasol cream (TEMOVATE) 0.05 % Apply topically 2 (two) times daily. For itching Patient not taking: Reported on 05/28/2019 08/20/18   Lindell Spar I, NP  Fluticasone-Salmeterol (ADVAIR DISKUS) 250-50 MCG/DOSE AEPB Inhale 1 puff into the lungs 2 (two) times daily. 05/28/19   Argentina Donovan, PA-C  gabapentin (NEURONTIN) 300 MG capsule Take 1 capsule (300 mg total) by mouth 3 (three) times daily. 05/28/19   Argentina Donovan, PA-C     Objective:  EXAM:  Vitals:   05/28/19 0859  BP: (!) 135/98  Pulse: 80  Temp: 98.1 F (36.7 C)  TempSrc: Oral  SpO2: 99%  Weight: 164 lb 3.2 oz (74.5 kg)  Height: 5\' 11"  (1.803 m)    General appearance : A&OX3. NAD. Non-toxic-appearing HEENT: Atraumatic and Normocephalic.  PERRLA. EOM intact.  Appears to be in poor health.  Voice is hoarse Chest/Lungs:  Breathing-non-labored, Good air entry bilaterally, breath sounds normal without rales, rhonchi, or wheezing  CVS: S1 S2 regular, no murmurs, gallops, rubs  Abdomen: Bowel sounds present, Non tender and not distended with no  gaurding, rigidity or rebound. Extremities: Bilateral Lower Ext shows no edema, both legs are warm to touch with = pulse throughout Neurology:  CN II-XII grossly intact, Non focal.   Psych:  TP linear. J/I WNL. Normal speech. Appropriate eye contact and affect.  Skin:  No Rash  Data Review Lab Results  Component Value Date   HGBA1C 6.9 (H) 08/11/2018     Assessment & Plan   1. Paresthesias - Thyroid Panel With TSH - gabapentin (NEURONTIN) 300 MG capsule; Take 1 capsule (300 mg total) by mouth 3 (three) times daily.  Dispense: 90 capsule; Refill: 3 - Vitamin B12 - Folate  2. Hypothyroidism due to acquired atrophy of thyroid Will not adjust based on this reading bc he just resumed meds 10/20 - Thyroid Panel With TSH - levothyroxine (SYNTHROID) 175 MCG tablet; Take 1 tablet (175 mcg total) by mouth daily before breakfast. For thyroid hormone replacement  Dispense: 30 tablet; Refill: 2  3. Fatigue, unspecified type  4. Insomnia, unspecified type  - mirtazapine (REMERON) 7.5 MG tablet; Take 1 tablet (7.5 mg total) by mouth at bedtime. For sleep  Dispense: 30 tablet; Refill: 0 - traZODone (DESYREL) 150 MG tablet; Take 1 tablet (150 mg total) by mouth at bedtime as needed for sleep.  Dispense: 30 tablet; Refill: 3  5. Anxiety - hydrOXYzine (ATARAX/VISTARIL) 25 MG tablet; Take 1 tablet (25 mg total) by mouth 3 (three) times daily as needed for anxiety.  Dispense: 60 tablet; Refill: 0  6. Hypertension, unspecified type Not controlled-resume meds - metoprolol succinate (TOPROL-XL) 25 MG 24 hr tablet; Take 1 tablet (25 mg total) by mouth daily. For high blood pressure  Dispense: 30 tablet; Refill: 2  7. Homeless - Ambulatory referral to Social Work  8. Shortness of breath -unclear if he has emphysema or COPD?;  Will add- - Fluticasone-Salmeterol (ADVAIR DISKUS) 250-50 MCG/DOSE AEPB; Inhale 1 puff into the lungs 2 (two) times daily.  Dispense: 1 each; Refill: 3  9. Hospital  discharge follow-up  Patient have been counseled extensively about nutrition and exercise  Return in about 6 weeks (around 07/09/2019) for assign PCP;  Dr Joya Gaskins if possible due to breathing issues.  The patient was given clear instructions to go to ER or return to medical center if symptoms don't improve, worsen or new problems develop. The patient verbalized understanding. The patient was told to call to get lab results if they haven't heard anything in the next week.     Freeman Caldron, PA-C [facility] [place], [place]   05/28/2019, 9:31 AM

## 2019-05-28 NOTE — Progress Notes (Signed)
Hospital f/u  Hands and feet numb and occa hurts  Difficulties breathing at times/has COPD  Med refills

## 2019-05-28 NOTE — BH Specialist Note (Signed)
Integrated Behavioral Health Initial Visit  MRN: [id number] Name: [name]  Number of [place] Clinician visits:: 1/6 Session Start time: 9:40am  Session End time: 10:20am Total time: 40   Type of Service: [place] Interpretor:No. Interpretor Name and Language: N/A   Warm Hand Off Completed.       SUBJECTIVE: [name] is a 53 y.o. male accompanied by self Patient was referred by Freeman Caldron for resource needs regarding homelessness. Patient reports the following symptoms/concerns: "I need help with everything". Pt reports he is currently homeless, living in a tent on a friend's property.  Duration of problem: Ongoing  Severity of problem: severe  OBJECTIVE: Mood: Depressed and Affect: Appropriate Risk of harm to self or others: No plan to harm self or others  LIFE CONTEXT: Family and Social: Pt reports homelessness since 2015-2016. Pt reports he is currently living in a tent on a friend's farm for 1 year.  School/Work: Pt used to work at [employer], was terminated in 2014, when asked why. pt states "I couldn't keep up". Pt is uninsured. Pt has applied for disability 2-3 times since 2014 and has been rejected. Self-Care: Pt receives $194/month in food stamps. Per chart review, pt has a hx of polysubstance use disorder. Substance Use was not discussed in today's encounter. Pt has transportation from friend whose property he lives on, pt uses the bus system. Pt has a cell phone.  Life Changes: Pt states he is not interested in staying at a shelter or using [facility]. Has had negative experiences with both in past. Pt reports his COPD is worsening.   GOALS ADDRESSED: Patient will: 1. Reduce symptoms of: stress 2. Increase knowledge and/or ability of: coping skills and self-management skills  3. Demonstrate ability to: Increase adequate support systems for patient/family  INTERVENTIONS: Interventions utilized: Link  to [employer]  Standardized Assessments completed: GAD-7 and PHQ 2&9  ASSESSMENT: Patient currently experiencing homelessness since 2015 - 2016. Pt lives in a tent for 1 year on friend's farm in [place]. Pt states he is no longer able to work due to health conditions. Pt reports he would like help reapplying for disability. Pt is not interested in staying at homeless shelter due to past negative experiences. Per chart review, pt has a hx of Polysubstance Use Disorder. Substance use was not addressed during today's encounter. Pt denied S/I and H/I.   Social Work Theatre manager provided pt with contact information for social security office to apply for disability. SW Intern encouraged pt to contact [facility] within 90 days if a denial is reached so social work can assist in pt referral to [employer]. SW Intern also provided contact information for DSS and encouraged pt to apply for Medicaid. SW Intern additionally provided pt with Covid-19 Response resource to assist with any future food needs.   Patient may benefit from applying for disability insurance and Medicaid to increase resource support.   PLAN: 1. Follow up with behavioral health clinician on : Pt encouraged to contact SW Intern or LCSW to follow up regarding Disability application and resource needs.  2. Behavioral recommendations: Pt encouraged to apply for Disability and Medicaid. 3. Referral(s): Community Resources:  Presenter, broadcasting 4. "From scale of 1-10, how likely are you to follow plan?": Pt reports he will call Social Services office tomorrow.   Berniece Salines Social Work Intern 05/28/2019 2:21pm

## 2019-06-02 LAB — THYROID PANEL WITH TSH
Free Thyroxine Index: 1.8 (ref 1.2–4.9)
T3 Uptake Ratio: 28 % (ref 24–39)
T4, Total: 6.5 ug/dL (ref 4.5–12.0)
TSH: 21.8 u[IU]/mL — ABNORMAL HIGH (ref 0.450–4.500)

## 2019-06-02 LAB — FOLATE: Folate: 6.4 ng/mL (ref >3.0)

## 2019-06-02 LAB — VITAMIN B12: Vitamin B-12: 231 pg/mL — ABNORMAL LOW (ref 232–1245)

## 2019-06-12 ENCOUNTER — Encounter: Payer: Self-pay | Admitting: *Deleted

## 2019-06-13 ENCOUNTER — Telehealth: Payer: Self-pay | Admitting: *Deleted

## 2019-06-13 NOTE — Telephone Encounter (Signed)
Patient aware of results to lab work. Verbalized understanding.  Reminded of his appointments with Dr. Joya Gaskins and Dr. Chapman Fitch.

## 2019-06-24 ENCOUNTER — Ambulatory Visit: Payer: Self-pay | Attending: Critical Care Medicine | Admitting: Critical Care Medicine

## 2019-06-24 ENCOUNTER — Encounter: Payer: Self-pay | Admitting: Critical Care Medicine

## 2019-06-24 ENCOUNTER — Other Ambulatory Visit: Payer: Self-pay

## 2019-06-24 ENCOUNTER — Ambulatory Visit (HOSPITAL_COMMUNITY)
Admission: RE | Admit: 2019-06-24 | Discharge: 2019-06-24 | Disposition: A | Discharge status: Home / Self Care | Payer: Self-pay | Source: Ambulatory Visit | Attending: Critical Care Medicine | Admitting: Critical Care Medicine

## 2019-06-24 ENCOUNTER — Telehealth: Payer: Self-pay | Admitting: Radiation Oncology

## 2019-06-24 VITALS — BP 132/87 | HR 77 | Temp 98.2°F | Resp 16 | Ht 71.0 in | Wt 158.0 lb

## 2019-06-24 DIAGNOSIS — Z8589 Personal history of malignant neoplasm of other organs and systems: Secondary | ICD-10-CM

## 2019-06-24 DIAGNOSIS — Z923 Personal history of irradiation: Secondary | ICD-10-CM

## 2019-06-24 DIAGNOSIS — R1319 Other dysphagia: Secondary | ICD-10-CM

## 2019-06-24 DIAGNOSIS — Z5900 Homelessness unspecified: Secondary | ICD-10-CM

## 2019-06-24 DIAGNOSIS — J209 Acute bronchitis, unspecified: Secondary | ICD-10-CM

## 2019-06-24 DIAGNOSIS — E034 Atrophy of thyroid (acquired): Secondary | ICD-10-CM

## 2019-06-24 DIAGNOSIS — G4700 Insomnia, unspecified: Secondary | ICD-10-CM | POA: Insufficient documentation

## 2019-06-24 DIAGNOSIS — F1414 Cocaine abuse with cocaine-induced mood disorder: Secondary | ICD-10-CM

## 2019-06-24 DIAGNOSIS — R1310 Dysphagia, unspecified: Secondary | ICD-10-CM

## 2019-06-24 DIAGNOSIS — J449 Chronic obstructive pulmonary disease, unspecified: Secondary | ICD-10-CM | POA: Insufficient documentation

## 2019-06-24 DIAGNOSIS — F419 Anxiety disorder, unspecified: Secondary | ICD-10-CM

## 2019-06-24 DIAGNOSIS — F17200 Nicotine dependence, unspecified, uncomplicated: Secondary | ICD-10-CM

## 2019-06-24 DIAGNOSIS — J439 Emphysema, unspecified: Secondary | ICD-10-CM

## 2019-06-24 DIAGNOSIS — R0602 Shortness of breath: Secondary | ICD-10-CM

## 2019-06-24 DIAGNOSIS — I10 Essential (primary) hypertension: Secondary | ICD-10-CM | POA: Insufficient documentation

## 2019-06-24 DIAGNOSIS — R202 Paresthesia of skin: Secondary | ICD-10-CM

## 2019-06-24 MED ORDER — GABAPENTIN 300 MG PO CAPS: 300.0000 mg | ORAL_CAPSULE | Freq: Three times a day (TID) | ORAL | 3 refills | Status: DC

## 2019-06-24 MED ORDER — METOPROLOL SUCCINATE ER 25 MG PO TB24: 25.0000 mg | ORAL_TABLET | Freq: Every day | ORAL | 2 refills | Status: DC

## 2019-06-24 MED ORDER — PREDNISONE 10 MG PO TABS: ORAL_TABLET | ORAL | 0 refills | Status: DC

## 2019-06-24 MED ORDER — FLUTICASONE-SALMETEROL 250-50 MCG/DOSE IN AEPB: 1.0000 | INHALATION_SPRAY | Freq: Two times a day (BID) | RESPIRATORY_TRACT | 3 refills | Status: DC

## 2019-06-24 MED ORDER — MIRTAZAPINE 7.5 MG PO TABS: 7.5000 mg | ORAL_TABLET | Freq: Every day | ORAL | 0 refills | Status: DC

## 2019-06-24 MED ORDER — HYDROXYZINE HCL 25 MG PO TABS: 25.0000 mg | ORAL_TABLET | Freq: Three times a day (TID) | ORAL | 0 refills | Status: DC | PRN

## 2019-06-24 MED ORDER — VITAMIN B-12 1000 MCG PO TABS: 1000.0000 ug | ORAL_TABLET | Freq: Every day | ORAL | 3 refills | Status: DC

## 2019-06-24 MED ORDER — LEVOTHYROXINE SODIUM 175 MCG PO TABS: 175.0000 ug | ORAL_TABLET | Freq: Every day | ORAL | 2 refills | Status: DC

## 2019-06-24 MED ORDER — TRAZODONE HCL 150 MG PO TABS: 150.0000 mg | ORAL_TABLET | Freq: Every evening | ORAL | 3 refills | Status: DC | PRN

## 2019-06-24 MED FILL — GABAPENTIN 300 MG CAPSULE: 300 | 30 days supply | Qty: 90 | Fill #0

## 2019-06-24 MED FILL — !ADVAIR 250/50 DISKUS: 250-50 | 30 days supply | Qty: 60 | Fill #0

## 2019-06-24 MED FILL — MIRTAZAPINE 7.5 MG TABLET: 7.5 | 30 days supply | Qty: 30 | Fill #0

## 2019-06-24 MED FILL — TRAZODONE HCL 150 MG TABS: 150 | 30 days supply | Qty: 30 | Fill #0

## 2019-06-24 MED FILL — predniSONE 10 MG TABS: 10 | 5 days supply | Qty: 20 | Fill #0

## 2019-06-24 MED FILL — hydrOXYzine HCL 25 MG TABS: 25 | 20 days supply | Qty: 60 | Fill #0

## 2019-06-24 MED FILL — METOPROLOL SUCCINATE ER 25: 25 | 30 days supply | Qty: 30 | Fill #0

## 2019-06-24 MED FILL — LEVOTHYROXINE 175 MCG TAB: 175 | 30 days supply | Qty: 30 | Fill #0

## 2019-06-24 NOTE — Assessment & Plan Note (Signed)
History of both cocaine and heroin use currently is free of this at this time

## 2019-06-24 NOTE — Assessment & Plan Note (Addendum)
Significant tobacco use we spent some time over tobacco cessation counseling at this visit     . Current smoking consumption amount: 1ppd  . Dicsussion on advise to quit smoking and smoking impacts: throat cancer recurrence, copd   . Patient's willingness to quit:  Not ready   . Methods to quit smoking discussed:  Nicotine replacement  . Medication management of smoking session drugs discussed: nicotine lozenges  . Resources provided:  AVS   . Setting quit date not as of yet  . Follow-up arranged 6 weeks   Time spent counseling the patient:  7min

## 2019-06-24 NOTE — Patient Instructions (Addendum)
Note you declined the flu vaccine  Begin vitamin B12 1 daily  Begin prednisone 10 mg tablet take 4 daily for 5 days and stop  Continue your inhalers as you are taking them  Obtain a chest x-ray today at [facility]  All your medications were refilled to our pharmacy  Referral to oncology was made to reassess for recurrence of thyroid cancer  Return in follow-up in 1 month with Dr. Joya Gaskins    Smoking Tobacco Information, Adult Smoking tobacco can be harmful to your health. Tobacco contains a poisonous (toxic), colorless chemical called nicotine. Nicotine is addictive. It changes the brain and can make it hard to stop smoking. Tobacco also has other toxic chemicals that can hurt your body and raise your risk of many cancers. How can smoking tobacco affect me? Smoking tobacco puts you at risk for:  Cancer. Smoking is most commonly associated with lung cancer, but can also lead to cancer in other parts of the body.  Chronic obstructive pulmonary disease (COPD). This is a long-term lung condition that makes it hard to breathe. It also gets worse over time.  High blood pressure (hypertension), heart disease, stroke, or heart attack.  Lung infections, such as pneumonia.  Cataracts. This is when the lenses in the eyes become clouded.  Digestive problems. This may include peptic ulcers, heartburn, and gastroesophageal reflux disease (GERD).  Oral health problems, such as gum disease and tooth loss.  Loss of taste and smell. Smoking can affect your appearance by causing:  Wrinkles.  Yellow or stained teeth, fingers, and fingernails. Smoking tobacco can also affect your social life, because:  It may be challenging to find places to smoke when away from home. Many workplaces, [employer], hotels, and public places are tobacco-free.  Smoking is expensive. This is due to the cost of tobacco and the long-term costs of treating health problems from smoking.  Secondhand smoke  may affect those around you. Secondhand smoke can cause lung cancer, breathing problems, and heart disease. Children of smokers have a higher risk for: ? Sudden infant death syndrome (SIDS). ? Ear infections. ? Lung infections. If you currently smoke tobacco, quitting now can help you:  Lead a longer and healthier life.  Look, smell, breathe, and feel better over time.  Save money.  Protect others from the harms of secondhand smoke. What actions can I take to prevent health problems? Quit smoking   Do not start smoking. Quit if you already do.  Make a plan to quit smoking and commit to it. Look for programs to help you and ask your health care provider for recommendations and ideas.  Set a date and write down all the reasons you want to quit.  Let your friends and family know you are quitting so they can help and support you. Consider finding friends who also want to quit. It can be easier to quit with someone else, so that you can support each other.  Talk with your health care provider about using nicotine replacement medicines to help you quit, such as gum, lozenges, patches, sprays, or pills.  Do not replace cigarette smoking with electronic cigarettes, which are commonly called e-cigarettes. The safety of e-cigarettes is not known, and some may contain harmful chemicals.  If you try to quit but return to smoking, stay positive. It is common to slip up when you first quit, so take it one day at a time.  Be prepared for cravings. When you feel the urge to smoke, chew  gum or suck on hard candy. Lifestyle  Stay busy and take care of your body.  Drink enough fluid to keep your urine pale yellow.  Get plenty of exercise and eat a healthy diet. This can help prevent weight gain after quitting.  Monitor your eating habits. Quitting smoking can cause you to have a larger appetite than when you smoke.  Find ways to relax. Go out with friends or family to a movie or a restaurant  where people do not smoke.  Ask your health care provider about having regular tests (screenings) to check for cancer. This may include blood tests, imaging tests, and other tests.  Find ways to manage your stress, such as meditation, yoga, or exercise. Where to find support To get support to quit smoking, consider:  Asking your health care provider for more information and resources.  Taking classes to learn more about quitting smoking.  Looking for local organizations that offer resources about quitting smoking.  Joining a support group for people who want to quit smoking in your local community.  Calling the smokefree.gov counselor helpline: 1-800-Quit-Now (903)426-6716) Where to find more information You may find more information about quitting smoking from:  HelpGuide.org: www.helpguide.org  https://hall.com/: smokefree.gov  American Lung Association: www.lung.org Contact a health care provider if you:  Have problems breathing.  Notice that your lips, nose, or fingers turn blue.  Have chest pain.  Are coughing up blood.  Feel faint or you pass out.  Have other health changes that cause you to worry. Summary  Smoking tobacco can negatively affect your health, the health of those around you, your finances, and your social life.  Do not start smoking. Quit if you already do. If you need help quitting, ask your health care provider.  Think about joining a support group for people who want to quit smoking in your local community. There are many effective programs that will help you to quit this behavior. This information is not intended to replace advice given to you by your health care provider. Make sure you discuss any questions you have with your health care provider. Document Released: 07/25/2016 Document Revised: 08/29/2017 Document Reviewed: 07/25/2016 Elsevier Patient Education  2020 [employer].

## 2019-06-24 NOTE — Assessment & Plan Note (Signed)
Paresthesias may be from neuropathy will give vitamin B12 on a daily basis of 1000 mcg to see if this improves as he does have vitamin B12 deficiency

## 2019-06-24 NOTE — Assessment & Plan Note (Signed)
The patient's been offered social work support for homelessness right now he chooses to stay in the [place]

## 2019-06-24 NOTE — Assessment & Plan Note (Signed)
Evidence of acute bronchitis with COPD mild exacerbation and chronic obstructive lung disease on exam  We will obtain chest x-ray today and as well begin prednisone 40 mg daily for 5 days and discontinue patient is to continue Advair on a daily basis and as needed albuterol

## 2019-06-24 NOTE — Progress Notes (Signed)
Subjective:    Patient ID: [name], male    DOB: 1966-06-07, 53 y.o.   MRN: [id number]  53 y.o.M here for f/u COPD Homelessnes This patient [place] and was seen previously in the clinic earlier in November by our physician assistant.  Documentation from that visit is as below.   [name] is a 53 y.o. male here to establish care and as hospital follow up.  He went to ED for med RF.  Homeless.  Out of thyroid meds ~ 1 month before restarting thyroid meds on 05/13/2019.  Denies drinking much alcohol.  C/o paresthesias in fingers and toes not controlled on gabapentin 200mg  tid.  Also severe fatigue.  Poor breathing.  Still smoking but trying to cut back.  H/o thyroid CA s/p radiation.     GABA did not help   1. Paresthesias - Thyroid Panel With TSH - gabapentin (NEURONTIN) 300 MG capsule; Take 1 capsule (300 mg total) by mouth 3 (three) times daily.  Dispense: 90 capsule; Refill: 3 - Vitamin B12 - Folate  2. Hypothyroidism due to acquired atrophy of thyroid Will not adjust based on this reading bc he just resumed meds 10/20 - Thyroid Panel With TSH - levothyroxine (SYNTHROID) 175 MCG tablet; Take 1 tablet (175 mcg total) by mouth daily before breakfast. For thyroid hormone replacement  Dispense: 30 tablet; Refill: 2  3. Fatigue, unspecified type  4. Insomnia, unspecified type  - mirtazapine (REMERON) 7.5 MG tablet; Take 1 tablet (7.5 mg total) by mouth at bedtime. For sleep  Dispense: 30 tablet; Refill: 0 - traZODone (DESYREL) 150 MG tablet; Take 1 tablet (150 mg total) by mouth at bedtime as needed for sleep.  Dispense: 30 tablet; Refill: 3  5. Anxiety - hydrOXYzine (ATARAX/VISTARIL) 25 MG tablet; Take 1 tablet (25 mg total) by mouth 3 (three) times daily as needed for anxiety.  Dispense: 60 tablet; Refill: 0  6. Hypertension, unspecified type Not controlled-resume meds - metoprolol succinate (TOPROL-XL) 25 MG 24 hr tablet;  Take 1 tablet (25 mg total) by mouth daily. For high blood pressure  Dispense: 30 tablet; Refill: 2  7. Homeless I am - Ambulatory referral to Social Work     8. Shortness of breath -unclear if he has emphysema or COPD?;  Will add- - Fluticasone-Salmeterol (ADVAIR DISKUS) 250-50 MCG/DOSE AEPB; Inhale 1 puff into the lungs 2 (two) times daily.  Dispense: 1 each; Refill: 3  The patient states the insomnia has been improved the use of Remeron and trazodone.  He states hydroxyzine also has helped the anxiety levels.  He also is taking the metoprolol 25 mg daily and this has been helpful as well.  He is using the Advair as well at 1 puff twice a day and this is helped his breathing.  He has not had a recent chest x-ray.  The patient also resume the levothyroxine and note lab results in the last visit showed still hypothyroidism and part of this was he was out of medication.  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  He has not been seen in follow-up in some time.  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  He also does not drink  alcohol.  He is still smoking however a pack a day of cigarettes  The patient does have very mild dysphagia at this time Note B12 levels did come back low and these were checked to evaluate for paresthesias therefore we will need to r begin B12 supplementation  Shortness of Breath This is a chronic problem. The current episode started more than 1 year ago. The problem occurs daily. The problem has been gradually worsening. Associated symptoms include orthopnea, PND, sputum production and wheezing. Pertinent negatives include no chest pain, headaches or hemoptysis. The symptoms are aggravated by smoke, odors, exercise and any activity.  Associated symptoms comments: Mucus is clear . Risk factors include smoking. He has tried beta agonist inhalers and steroid inhalers for the symptoms. The treatment provided mild relief. His past medical history is significant for COPD. There is no history of asthma, bronchiolitis, CAD, DVT, PE or pneumonia.   Past Medical History:  Diagnosis Date  . COPD (chronic obstructive pulmonary disease) ([place])   . Headache(784.0)   . Heart murmur    child  . Hx of radiation therapy 05/19/13- 06/27/13   glottic larynx, right true vocal cord 6300 cGy 28 sessions  . Hypothyroidism   . Influenza B 08/01/2018  . Long term (current) use of opiate analgesic 10/11/2016  . Polysubstance abuse ([place])   . Shortness of breath   . 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 COPD Mother   . Emphysema Mother   . Heart attack Father      Social History   Socioeconomic History  . Marital status: Legally Separated    Spouse name: Not on file  . Number of children: 3  . Years of education: Not on file  . 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 Financial resource strain: Not on file  . Food insecurity    Worry: Not on file    Inability: Not on file  . Transportation needs    Medical: Not on file    Non-medical: Not on file  Tobacco Use  . Smoking status: Current Every Day Smoker    Packs/day: 1.00    Years: 34.00    Pack years: 34.00    Types: Cigarettes  . Smokeless tobacco: Never Used  . Tobacco comment: DOWN TO 4 - 5 CIGARETTES A DAY  Substance and Sexual Activity  . Alcohol use: No    Alcohol/week: 0.0 standard drinks  . Drug use: Yes    Types: Marijuana, Cocaine, Heroin    Comment: Heroin  . Sexual activity: Yes    Birth control/protection: Condom  Lifestyle  . Physical activity    Days per week:  Not on file    Minutes per session: Not on file  . Stress: Not on file  Relationships  . Social Herbalist on phone: Not on file    Gets together: Not on file    Attends religious service: Not on file    Active member of club or organization: Not on file    Attends meetings of clubs or organizations: Not on file    Relationship status: Not on file  . Intimate partner violence    Fear of current or ex partner: Not on file    Emotionally abused:  Not on file    Physically abused: Not on file    Forced sexual activity: Not on file  Other Topics Concern  . Not on file  Social History Narrative   The patient is married. Patient has 3 children. 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   The patient has a history of smoking 2 packs of cigarette a day for 34 years. Patient current is trying to quit smoking. Patient denies use of alcohol. Patient denies use of other illicit drugs.     Allergies  Allergen Reactions  . Fentanyl Nausea And Vomiting  . Citalopram Other (See Comments)    Dizziness and shakiness.  . Morphine And Related Nausea And Vomiting     Outpatient Medications Prior to Visit  Medication Sig Dispense Refill  . albuterol (VENTOLIN HFA) 108 (90 Base) MCG/ACT inhaler Inhale 2 puffs into the lungs every 4 (four) hours as needed for wheezing or shortness of breath. 18 g 1  . clobetasol cream (TEMOVATE) 0.05 % Apply topically 2 (two) times daily. For itching (Patient not taking: Reported on 05/28/2019) 30 g 0  . Fluticasone-Salmeterol (ADVAIR DISKUS) 250-50 MCG/DOSE AEPB Inhale 1 puff into the lungs 2 (two) times daily. 1 each 3  . gabapentin (NEURONTIN) 300 MG capsule Take 1 capsule (300 mg total) by mouth 3 (three) times daily. 90 capsule 3  . hydrOXYzine (ATARAX/VISTARIL) 25 MG tablet Take 1 tablet (25 mg total) by mouth 3 (three) times daily as needed for anxiety. 60 tablet 0  . levothyroxine (SYNTHROID) 175 MCG tablet Take 1 tablet (175 mcg total) by mouth  daily before breakfast. For thyroid hormone replacement 30 tablet 2  . metoprolol succinate (TOPROL-XL) 25 MG 24 hr tablet Take 1 tablet (25 mg total) by mouth daily. For high blood pressure 30 tablet 2  . mirtazapine (REMERON) 7.5 MG tablet Take 1 tablet (7.5 mg total) by mouth at bedtime. For sleep 30 tablet 0  . traZODone (DESYREL) 150 MG tablet Take 1 tablet (150 mg total) by mouth at bedtime as needed for sleep. 30 tablet 3   No facility-administered medications prior to visit.      Review of Systems  Respiratory: Positive for sputum production, shortness of breath and wheezing. Negative for hemoptysis.   Cardiovascular: Positive for orthopnea and PND. Negative for chest pain.  Neurological: Negative for headaches.       Objective:   Physical Exam  BP 132/87   Pulse 77   Temp 98.2 F (36.8 C) (Oral)   Resp 16   Ht 5\' 11"  (1.803 m)   Wt 158 lb (71.7 kg)   SpO2 100%   BMI 22.04 kg/m   Gen: Pleasant, well-nourished, in no distress,  normal affect  ENT: No lesions,  mouth clear,  oropharynx clear, no postnasal drip  Neck: No JVD, no TMG, no carotid bruits  Lungs: No use of accessory muscles, no dullness to percussion, clear without rales or rhonchi  Cardiovascular: RRR, heart sounds normal, no murmur or gallops, no peripheral edema  Abdomen: soft and NT, no HSM,  BS normal  Musculoskeletal: No deformities, no cyanosis or clubbing  Neuro: alert, non focal  Skin: Warm, no lesions or rashes      Assessment & Plan:  I personally reviewed all images and lab data in the [facility] system as well as any outside material available during this office visit and agree with the  radiology impressions.   Hypertension Hypertension noted to be improved on today's  visit therefore will refill metoprolol at 25 mg daily  Acute bronchitis Evidence of acute bronchitis with COPD mild exacerbation and chronic obstructive lung disease on exam  We will obtain chest x-ray today and as well  begin prednisone 40 mg daily for 5 days and discontinue patient is to continue Advair on a daily basis and as needed albuterol  Emphysema of lung ([place]) The patient has previously had pulmonary function showing reduced FEV1 to FVC ratio with normal FEV1 and increased lung capacities and residual volume compatible with overinflation and reduced diffusion capacity all characteristic of emphysema  We will again continue the Advair as prescribed  Esophageal dysphagia Esophageal dysphagia on the basis of radiation therapy will walk monitor for now  Hypothyroidism due to acquired atrophy of thyroid Plan to resume and refill Synthroid at 175 mcg daily  Cocaine abuse with cocaine-induced mood disorder (HCC) History of both cocaine and heroin use currently is free of this at this time  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 History of radiation therapy to the larynx he had 28 fractions totaling 63 radiation amounts finished in 2014  Given the fact the patient is still smoking we will plan to refer back to oncology for further evaluation may actually need to see ear nose and throat also the patient will need financial assistance if that is the case  Homelessness The patient's been offered social work support for homelessness right now he chooses to stay in the tent encampment  Insomnia Insomnia is improved on current program  Paresthesias Paresthesias may be from neuropathy will give vitamin B12 on a daily basis of 1000 mcg to see if this improves as he does have vitamin B12 deficiency  Tobacco use disorder Significant tobacco use we spent some time over tobacco cessation counseling at this visit     . Current smoking consumption amount: 1ppd  . Dicsussion on advise to quit smoking and smoking impacts: throat cancer recurrence, copd   . Patient's willingness to quit:  Not ready   . Methods to quit smoking discussed:  Nicotine replacement  . Medication  management of smoking session drugs discussed: nicotine lozenges  . Resources provided:  AVS   . Setting quit date not as of yet  . Follow-up arranged 6 weeks   Time spent counseling the patient:  23min    [name] was seen today for shortness of breath.  Diagnoses and all orders for this visit:  COPD with chronic bronchitis ([place]) -     DG Chest 2 View; Future  Anxiety -     hydrOXYzine (ATARAX/VISTARIL) 25 MG tablet; Take 1 tablet (25 mg total) by mouth 3 (three) times daily as needed for anxiety.  Hypothyroidism due to acquired atrophy of thyroid -     levothyroxine (SYNTHROID) 175 MCG tablet; Take 1 tablet (175 mcg total) by mouth daily before breakfast. For thyroid hormone replacement  Hypertension, unspecified type -     metoprolol succinate (TOPROL-XL) 25 MG 24 hr tablet; Take 1 tablet (25 mg total) by mouth daily. For high blood pressure  Paresthesias -     gabapentin (NEURONTIN) 300 MG capsule; Take 1 capsule (300 mg total) by mouth 3 (three) times daily.  Shortness of breath -     Fluticasone-Salmeterol (ADVAIR DISKUS) 250-50 MCG/DOSE AEPB; Inhale 1 puff into the lungs 2 (two) times daily.  Insomnia, unspecified type -     mirtazapine (REMERON) 7.5 MG tablet; Take 1 tablet (7.5 mg total) by mouth  at bedtime. For sleep -     traZODone (DESYREL) 150 MG tablet; Take 1 tablet (150 mg total) by mouth at bedtime as needed for sleep.  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 -      predniSONE (DELTASONE) 10 MG tablet; Take 4 tablets daily for 5 days then stop

## 2019-06-24 NOTE — Assessment & Plan Note (Signed)
Esophageal dysphagia on the basis of radiation therapy will walk monitor for now

## 2019-06-24 NOTE — Assessment & Plan Note (Signed)
The patient has previously had pulmonary function showing reduced FEV1 to FVC ratio with normal FEV1 and increased lung capacities and residual volume compatible with overinflation and reduced diffusion capacity all characteristic of emphysema  We will again continue the Advair as prescribed

## 2019-06-24 NOTE — Assessment & Plan Note (Signed)
Insomnia is improved on current program

## 2019-06-24 NOTE — Assessment & Plan Note (Signed)
Hypertension noted to be improved on today's visit therefore will refill metoprolol at 25 mg daily

## 2019-06-24 NOTE — Telephone Encounter (Signed)
New message: ° ° °LVM for patient to return call to schedule appt from referral received. °

## 2019-06-24 NOTE — Assessment & Plan Note (Signed)
History of radiation therapy to the larynx he had 28 fractions totaling 63 radiation amounts finished in 2014  Given the fact the patient is still smoking we will plan to refer back to oncology for further evaluation may actually need to see ear nose and throat also the patient will need financial assistance if that is the case

## 2019-06-24 NOTE — Assessment & Plan Note (Signed)
Plan to resume and refill Synthroid at 175 mcg daily

## 2019-06-26 NOTE — Progress Notes (Signed)
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 [x]  []  He reports losing about 10 lbs in the last 3 weeks  Swallowing concerns? [x]  []  He reports food getting stuck when he swallows. He is eating softer foods.   PEG? []  [x]     Referrals Yes No Comments  Social Work? []  [x]    Dentistry? []  [x]    Swallowing therapy? []  [x]    Nutrition? []  [x]    Med/Onc? []  [x]     Safety Issues Yes No Comments  Prior radiation? [x]  []  Prior Radiotherapy:   T1 N0 larynx cancer - STAGE I. 63 Gray in 28 fractions to larynx completed 06/27/2013   Pacemaker/ICD? []  [x]    Possible current pregnancy? []  [x]    Is the patient on methotrexate? []  [x]     Tobacco/Marijuana/Snuff/ETOH use: He is currently smoking 4-5 cigarettes daily.   Past/Anticipated interventions by otolaryngology, if any:  Dr. Wilburn Cornelia  09/25/2014  12//07/2018 Dr. 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 Isidore Moos: 04/28/2016 Follow-up in 1 year, sooner if needed. The patient is scheduled to see Dr. Heber Culdesac on 06/22/16. I am referring him to Dr. Wilburn Cornelia for dysphagia advice and repeat laryngoscopy. He has not done one in some time due to the patient's non-compliance with ENT follow up. I will order a chest X-ray given the patient's history and continue use of smoking. The patient was encouraged to call with any  issues or questions before then

## 2019-06-27 ENCOUNTER — Encounter: Payer: Self-pay | Admitting: Radiation Oncology

## 2019-06-27 ENCOUNTER — Other Ambulatory Visit: Payer: Self-pay

## 2019-06-27 ENCOUNTER — Telehealth: Payer: Self-pay | Admitting: *Deleted

## 2019-06-27 ENCOUNTER — Ambulatory Visit
Admission: RE | Admit: 2019-06-27 | Discharge: 2019-06-27 | Disposition: A | Discharge status: Home / Self Care | Payer: Self-pay | Source: Ambulatory Visit | Attending: Radiation Oncology | Admitting: Radiation Oncology

## 2019-06-27 ENCOUNTER — Encounter: Payer: Self-pay | Admitting: *Deleted

## 2019-06-27 DIAGNOSIS — C320 Malignant neoplasm of glottis: Secondary | ICD-10-CM

## 2019-06-27 DIAGNOSIS — Z8589 Personal history of malignant neoplasm of other organs and systems: Secondary | ICD-10-CM

## 2019-06-27 MED ORDER — NICOTINE 7 MG/24HR TD PT24: MEDICATED_PATCH | TRANSDERMAL | 0 refills | Status: DC

## 2019-06-27 MED ORDER — NICOTINE 14 MG/24HR TD PT24: MEDICATED_PATCH | TRANSDERMAL | 2 refills | Status: DC

## 2019-06-27 MED FILL — NICOTINE 14 MG/24HR PATCH: 14 | 14 days supply | Qty: 14 | Fill #0

## 2019-06-27 NOTE — Progress Notes (Signed)
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 63 Gray in 28 fractions to larynx completed 06/27/2013    ICD-10-CM   1. Squamous cell carcinoma of vocal cord (HCC)  C32.0     Narrative:  The patient was contacted today for re-evaluation. He was last seen by me in 04/2016. He has since been lost to follow up. He presented to the ED on 05/13/2019 for medication refill request after being out of his thyroid medication for about a month. He was referred to [name] to establish primary care. [name] will be monitoring his hypothyroidism. He reports increased dysphagia and hoarseness. He has had a history of voice hoarseness for many years.  He underwent chest x-ray at office visit on 06/24/2019, which showed: stable scarring right apex; lungs elsewhere clear; heart size normal; no adenopathy.   Weight changes, if any: waxes and wanes Wt Readings from Last 3 Encounters:  06/24/19 158 lb (71.7 kg)  05/28/19 164 lb 3.2 oz (74.5 kg)  05/13/19 140 lb (63.5 kg)   Swallowing issues, if any: yes, worsening.  History of dilatation of esophagus but cannot remember where or when.   Smoking or chewing tobacco? He is smoking at least 5-6 cigarettes daily,sometimes 1/2 a pack.    Last ENT visit was on: [name] in 09/2014, he has been lost to follow up.  Other notable issues, if any: He is currently homeless but states he has transportation.   ALLERGIES:  is allergic to fentanyl; citalopram; and morphine and related.  Meds: Current Outpatient Medications  Medication Sig Dispense Refill  . albuterol (VENTOLIN HFA) 108 (90 Base) MCG/ACT  inhaler Inhale 2 puffs into the lungs every 4 (four) hours as needed for wheezing or shortness of breath. 18 g 1  . Fluticasone-Salmeterol (ADVAIR DISKUS) 250-50 MCG/DOSE AEPB Inhale 1 puff into the lungs 2 (two) times daily. 1 each 3  . gabapentin (NEURONTIN) 300 MG capsule Take 1 capsule (300 mg total) by mouth 3 (three) times daily. 90 capsule 3  . hydrOXYzine (ATARAX/VISTARIL) 25 MG tablet Take 1 tablet (25 mg total) by mouth 3 (three) times daily as needed for anxiety. 60 tablet 0  . levothyroxine (SYNTHROID) 175 MCG tablet Take 1 tablet (175 mcg total) by mouth daily before breakfast. For thyroid hormone replacement 30 tablet 2  . metoprolol succinate (TOPROL-XL) 25 MG 24 hr tablet Take 1 tablet (25 mg total) by mouth daily. For high blood pressure 30 tablet 2  . mirtazapine (REMERON) 7.5 MG tablet Take 1 tablet (7.5 mg total) by mouth at bedtime. For sleep 30 tablet 0  . predniSONE (DELTASONE) 10 MG tablet Take 4 tablets daily for 5 days then stop 20 tablet 0  . traZODone (DESYREL) 150 MG tablet Take 1 tablet (150 mg total) by mouth at bedtime as needed for sleep. 30 tablet 3  . vitamin B-12 (CYANOCOBALAMIN) 1000 MCG tablet Take 1 tablet (1,000 mcg total) by mouth daily. 60 tablet 3   No current facility-administered medications for this encounter.     Physical Findings: telephone visit. Hoarse.  Lab Findings: Lab Results  Component Value Date  WBC 3.0 (L) 05/13/2019   HGB 13.6 05/13/2019   HCT 41.4 05/13/2019   MCV 99.0 05/13/2019   PLT 158 05/13/2019    Lab Results  Component Value Date   TSH 21.800 (H) 05/28/2019    Radiographic Findings: No results found.  Impression/Plan:    History of glottic cancer, several years out from curative radiotherapy, now with progressive hoarseness.  He has a history of missing follow-up appointments and was ultimately lost to follow-up hearing and otolaryngology.  He continues to smoke AGAINST MEDICAL ADVICE.  Leslie Orem, RN, our Head  and Neck Oncology Navigator is going to try to get him back in the loop with his otolaryngologist for laryngoscopy, consideration of redilation of his esophagus if needed given his dysphagia, and long-term monitoring.   Smoking: today I spoke with him very seriously about tobacco cessation again.  I will prescribe nicotine patches for him and he set a quit date of January 1.    I see that his TSH is still elevated.  According to notes by [name] he will continue to have this managed in his primary clinic. The patient understands to take his levothyroxine on an empty stomach at least an hour before breakfast every day.  Plan is for him to follow-up with laryngoscopies w/otolaryngology and if he ultimately shows sign of relapse his salvage may need to be laryngectomy.  Right in now the cancer center due to limited PPE we are not performing laryngoscopies, and otolaryngology is ideal for laryngoscopy in a high risk patient like himself because they can do biopsies of anything suspicious. I will see him back if there is an indication for re-irradiation or a special need from the otolaryngology team that I can help with.  The patient will also discuss his recurrent dysphagia with otolaryngology in case he is a candidate for redilatation of his esophagus.  I have sent a message to update [name] on the plan above  This encounter was provided by telemedicine by telephone as patient was unable to access MyChart video during pandemic precautions The patient has given verbal consent for this type of encounter and has been advised to only accept a meeting of this type in a secure network environment. The time spent during this encounter was over 10 minutes. The attendants for this meeting include [name]  and [name].  Leslie Orem, RN, our Head and Neck Oncology Navigator was also present. During the encounter, [name] was located at [facility] Radiation Oncology Department.   [name] was located at home.  _____________________________________   Leslie Gibson, MD    This document serves as a record of services personally performed by Leslie Gibson, MD. It was created on her behalf by Wilburn Mylar, a trained medical scribe. The creation of this record is based on the scribe's personal observations and the provider's statements to them. This document has been checked and approved by the attending provider.

## 2019-06-27 NOTE — Telephone Encounter (Signed)
Oncology Nurse Navigator Documentation  Joined [name] during telephone Reconsult with Dr. Isidore Moos. Per discussion, he understands I will coordinate appt with ENT Dr. Wilburn Cornelia to include laryngoscopy.  Gayleen Orem, RN, BSN Head & Neck Oncology Nurse [place] [facility] [place] [phone]

## 2019-06-30 ENCOUNTER — Telehealth: Payer: Self-pay | Admitting: *Deleted

## 2019-07-01 NOTE — Telephone Encounter (Signed)
Oncology Nurse Navigator Documentation  Per patient's 12/4 appt with Dr. Isidore Moos, sent secure e-mail to ENT Dr. Victorio Palm medical assistant Nilsa Nutting requesting next available follow-up appt with laryngoscopy to evaluate patient's c/o hoarseness/dysphagia.   Received reply confirming message receipt.  Gayleen Orem, RN, BSN Head & Neck Oncology Nurse [place] at [place] [phone]

## 2019-07-09 ENCOUNTER — Encounter: Payer: Self-pay | Admitting: Family Medicine

## 2019-07-09 ENCOUNTER — Other Ambulatory Visit: Payer: Self-pay

## 2019-07-09 ENCOUNTER — Ambulatory Visit: Payer: Self-pay | Attending: Family Medicine | Admitting: Family Medicine

## 2019-07-09 ENCOUNTER — Telehealth: Payer: Self-pay | Admitting: *Deleted

## 2019-07-09 DIAGNOSIS — R490 Dysphonia: Secondary | ICD-10-CM

## 2019-07-09 DIAGNOSIS — J4489 Other specified chronic obstructive pulmonary disease: Secondary | ICD-10-CM

## 2019-07-09 DIAGNOSIS — J449 Chronic obstructive pulmonary disease, unspecified: Secondary | ICD-10-CM

## 2019-07-09 DIAGNOSIS — Z8589 Personal history of malignant neoplasm of other organs and systems: Secondary | ICD-10-CM

## 2019-07-09 DIAGNOSIS — J029 Acute pharyngitis, unspecified: Secondary | ICD-10-CM

## 2019-07-09 MED ORDER — PREDNISONE 20 MG PO TABS: ORAL_TABLET | ORAL | 0 refills | Status: DC

## 2019-07-09 MED ORDER — MAGIC MOUTHWASH: 5.0000 mL | Freq: Four times a day (QID) | ORAL | 4 refills | Status: AC

## 2019-07-09 MED ORDER — AMOXICILLIN-POT CLAVULANATE 500-125 MG PO TABS: 1.0000 | ORAL_TABLET | Freq: Two times a day (BID) | ORAL | 0 refills | Status: DC

## 2019-07-09 MED FILL — AMOX-CLAV 500-125 MG TABLET: 500-125 | 7 days supply | Qty: 14 | Fill #0

## 2019-07-09 MED FILL — predniSONE 20 MG TABS: 20 | 5 days supply | Qty: 10 | Fill #0

## 2019-07-09 NOTE — Telephone Encounter (Signed)
Oncology Nurse Navigator Documentation  Returned call to [place].  Provided further explanation for [name] guidance for [name] to see [name] per his 12/4 telemedicine appt with her.  Noted I had coordinated follow-appt with [name] office 12/7.  Provided her phone # for [name] CMA should she wish to contact his office.    Leslie Orem, RN, BSN Head & Neck Oncology Nurse [place] at [place] [phone]

## 2019-07-09 NOTE — Progress Notes (Signed)
Virtual Visit via Telephone Note  I connected with [name] on 07/09/19 at  9:50 AM EST by telephone and verified that I am speaking with the correct person using two identifiers.   I discussed the limitations, risks, security and privacy concerns of performing an evaluation and management service by telephone and the availability of in person appointments. I also discussed with the patient that there may be a patient responsible charge related to this service. The patient expressed understanding and agreed to proceed.  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  Patient also reports that his oncologist would like for him to have a follow-up appointment with ear nose and throat.  Patient does have some issues with swallowing/dysphagia which he states are chronic but he states that he cannot swallow pills without difficulty.  He does have sensation of wheezing/chest tightness.  He denies chest pain or palpitations, no abdominal pain-no nausea or vomiting.  No fever or chills.  He does have some postnasal drainage.  No ear pain or earache.   Past Medical History:  Diagnosis Date  . COPD (chronic obstructive pulmonary disease) ([place])   . Headache(784.0)   . Heart murmur    child  . Hx of radiation therapy 05/19/13- 06/27/13   glottic larynx, right true vocal cord 6300 cGy 28 sessions  . Hypothyroidism   . Influenza B 08/01/2018  . Long term (current) use of opiate analgesic 10/11/2016  . Polysubstance abuse ([place])   . Shortness of breath   . Squamous cell carcinoma of  vocal cord ([place]) 09/11/2013  . Vocal cord cancer ([place]) 05/01/2013    Past Surgical History:  Procedure Laterality Date  . BALLOON DILATION N/A 12/30/2013   Procedure: BALLOON DILATION;  Surgeon: Garlan Fair, MD;  Location: [facility];  Service: Endoscopy;  Laterality: N/A;  . DIRECT LARYNGOSCOPY Right 04/24/2013   Procedure: DIRECT LARYNGOSCOPY and BIOPSY OF TONGUE;  Surgeon: Jerrell Belfast, MD;  Location: [facility];  Service: ENT;  Laterality: Right;  . ESOPHAGEAL MANOMETRY N/A 09/07/2014   Procedure: ESOPHAGEAL MANOMETRY (EM);  Surgeon: Garlan Fair, MD;  Location: [facility];  Service: Endoscopy;  Laterality: N/A;  . ESOPHAGOGASTRODUODENOSCOPY (EGD) WITH PROPOFOL N/A 12/30/2013   Procedure: ESOPHAGOGASTRODUODENOSCOPY (EGD) WITH PROPOFOL;  Surgeon: Garlan Fair, MD;  Location: [facility];  Service: Endoscopy;  Laterality: N/A;  . MICROLARYNGOSCOPY Right 04/24/2013   Procedure: MICROLARYNGOSCOPY and RIGHT VOCAL CORD BIOPSY;  Surgeon: Jerrell Belfast, MD;  Location: [facility];  Service: ENT;  Laterality: Right;  . MULTIPLE EXTRACTIONS WITH ALVEOLOPLASTY N/A 09/30/2013   Procedure: Extraction of tooth #'s 2,3,4,5,6,7,8,9,10,11,12,15,22,23,24,25,26,27,28 with alveoloplasty;  Surgeon: Lenn Cal, DDS;  Location: [facility];  Service: Oral Surgery;  Laterality: N/A;  . TONSILLECTOMY     as a child  . wisdon teeth     age 5    Family History  Problem Relation Age of Onset  . COPD Mother   . Emphysema Mother   . Heart attack Father     Social History   Tobacco Use  . Smoking status: Current  Every Day Smoker    Packs/day: 1.00    Years: 34.00    Pack years: 34.00    Types: Cigarettes  . Smokeless tobacco: Never Used  . Tobacco comment: DOWN TO 4 - 5 CIGARETTES A DAY  Substance Use Topics  . Alcohol use: No    Alcohol/week: 0.0 standard drinks  . Drug use: Not Currently    Types: Marijuana, Cocaine, Heroin    Comment: He hasn't used drugs in "over a year"- 06/27/19      Allergies  Allergen Reactions  . Fentanyl Nausea And Vomiting  . Citalopram Other (See Comments)    Dizziness and shakiness.  . Morphine And Related Nausea And Vomiting       Observations/Objective: No vital signs or physical exam conducted as visit was done via telephone  Assessment and Plan: 1. COPD with chronic bronchitis ([place]); 4.  Acute sore throat On review of most recent office visit note, patient was seen 06/24/2019 by another provider and was treated for COPD exacerbation with prednisone taper.  Patient will again be placed on prednisone taper and should continue his home respiratory medications.  Because he has recently been treated with prednisone, there is a possibility that his current sore throat might be related to throat irritation from candidiasis and prescription also being sent in for patient to use Magic mouthwash.  Prescription for Augmentin to help with sore throat as well as COPD exacerbation.  Patient is cautioned to go to the emergency department if he has worsening of breathing/shortness of breath, difficulty swallowing, sensation of difficulty breathing or any other concerns.  He is encouraged to try placing pills in pudding or applesauce to help with swallowing as he does have a history of dysphagia.  Patient should call or return in 1 to 2 weeks if not feeling better, sooner if needed in ED if any worsening of symptoms. - amoxicillin-clavulanate (AUGMENTIN) 500-125 MG tablet; Take 1 tablet (500 mg total) by mouth 2 (two) times daily. Take after eating  Dispense: 14 tablet; Refill: 0 - predniSONE (DELTASONE) 20 MG tablet; 2 pills once daily x 5 days, take after eating  Dispense: 10 tablet; Refill: 0 -Magic mouthwash solution, 5 mL 4 times daily x10 days.  Dispense: 200 mL.  Refill #4  2. Hoarseness of voice 3. 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 Patient with history of squamous cell carcinoma of the vocal cord and has hoarse  speaking voice with recent onset of sore throat.  Patient has been referred to ENT for further evaluation - Ambulatory referral to ENT    Follow Up Instructions:Return in about 1 week (around 07/16/2019) for COPD/sorethroat; 1 to 2 weeks if not better; ED if symptoms worsen.   I discussed the assessment and treatment plan with the patient. The patient was provided an opportunity to ask questions and all were answered. The patient agreed with the plan and demonstrated an understanding of the instructions.   The patient was advised to call back or seek an in-person evaluation if the symptoms worsen or if the condition fails to improve as anticipated.  I provided 12 minutes of non-face-to-face time during this encounter.   Antony Blackbird, MD

## 2019-07-09 NOTE — Progress Notes (Signed)
Patient verified DOB Patient has not taken medication. Patient has not eaten today. Patient complains of a sore throat for a few weeks. Patient spoke with oncology and they stated he needed to see the ENT.

## 2019-07-29 ENCOUNTER — Ambulatory Visit: Payer: Self-pay | Admitting: Critical Care Medicine

## 2019-07-29 NOTE — Progress Notes (Deleted)
Subjective:    Patient ID: [name], male    DOB: 1966-06-07, 54 y.o.   MRN: [id number]  53 y.o.M here for f/u COPD Homelessnes This [place] and was seen previously in the clinic earlier in November by our physician assistant.  Documentation from that visit is as below.   Khalin Royce is a 54 y.o. male here to establish care and as hospital follow up.  He went to ED for med RF.  Homeless.  Out of thyroid meds ~ 1 month before restarting thyroid meds on 05/13/2019.  Denies drinking much alcohol.  C/o paresthesias in fingers and toes not controlled on gabapentin 200mg  tid.  Also severe fatigue.  Poor breathing.  Still smoking but trying to cut back.  H/o thyroid CA s/p radiation.     GABA did not help   1. Paresthesias - Thyroid Panel With TSH - gabapentin (NEURONTIN) 300 MG capsule; Take 1 capsule (300 mg total) by mouth 3 (three) times daily.  Dispense: 90 capsule; Refill: 3 - Vitamin B12 - Folate  2. Hypothyroidism due to acquired atrophy of thyroid Will not adjust based on this reading bc he just resumed meds 10/20 - Thyroid Panel With TSH - levothyroxine (SYNTHROID) 175 MCG tablet; Take 1 tablet (175 mcg total) by mouth daily before breakfast. For thyroid hormone replacement  Dispense: 30 tablet; Refill: 2  3. Fatigue, unspecified type  4. Insomnia, unspecified type  - mirtazapine (REMERON) 7.5 MG tablet; Take 1 tablet (7.5 mg total) by mouth at bedtime. For sleep  Dispense: 30 tablet; Refill: 0 - traZODone (DESYREL) 150 MG tablet; Take 1 tablet (150 mg total) by mouth at bedtime as needed for sleep.  Dispense: 30 tablet; Refill: 3  5. Anxiety - hydrOXYzine (ATARAX/VISTARIL) 25 MG tablet; Take 1 tablet (25 mg total) by mouth 3 (three) times daily as needed for anxiety.  Dispense: 60 tablet; Refill: 0  6. Hypertension, unspecified type Not controlled-resume meds - metoprolol succinate (TOPROL-XL) 25 MG 24 hr tablet;  Take 1 tablet (25 mg total) by mouth daily. For high blood pressure  Dispense: 30 tablet; Refill: 2  7. Homeless I am - Ambulatory referral to Social Work     8. Shortness of breath -unclear if he has emphysema or COPD?;  Will add- - Fluticasone-Salmeterol (ADVAIR DISKUS) 250-50 MCG/DOSE AEPB; Inhale 1 puff into the lungs 2 (two) times daily.  Dispense: 1 each; Refill: 3  The patient states the insomnia has been improved the use of Remeron and trazodone.  He states hydroxyzine also has helped the anxiety levels.  He also is taking the metoprolol 25 mg daily and this has been helpful as well.  He is using the Advair as well at 1 puff twice a day and this is helped his breathing.  He has not had a recent chest x-ray.  The patient also resume the levothyroxine and note lab results in the last visit showed still hypothyroidism and part of this was he was out of medication.  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  He has not been seen in follow-up in some time.  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  He also does not drink  alcohol.  He is still smoking however a pack a day of cigarettes  The patient does have very mild dysphagia at this time Note B12 levels did come back low and these were checked to evaluate for paresthesias therefore we will need to r begin B12 supplementation  07/29/2019  Hypertension Hypertension noted to be improved on today's visit therefore will refill metoprolol at 25 mg daily  Acute bronchitis Evidence of acute bronchitis with COPD mild exacerbation and chronic obstructive lung disease on exam  We will obtain chest x-ray today and as well begin prednisone 40 mg daily for 5 days and discontinue patient is to continue Advair  on a daily basis and as needed albuterol  Emphysema of lung ([place]) The patient has previously had pulmonary function showing reduced FEV1 to FVC ratio with normal FEV1 and increased lung capacities and residual volume compatible with overinflation and reduced diffusion capacity all characteristic of emphysema  We will again continue the Advair as prescribed  Esophageal dysphagia Esophageal dysphagia on the basis of radiation therapy will walk monitor for now  Hypothyroidism due to acquired atrophy of thyroid Plan to resume and refill Synthroid at 175 mcg daily  Cocaine abuse with cocaine-induced mood disorder ([place]) History of both cocaine and heroin use currently is free of this at this time  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 History of radiation therapy to the larynx he had 28 fractions totaling 63 radiation amounts finished in 2014  Given the fact the patient is still smoking we will plan to refer back to oncology for further evaluation may actually need to see ear nose and throat also the patient will need financial assistance if that is the case  Homelessness The patient's been offered social work support for homelessness right now he chooses to stay in the tent encampment  Insomnia Insomnia is improved on current program  Paresthesias Paresthesias may be from neuropathy will give vitamin B12 on a daily basis of 1000 mcg to see if this improves as he does have vitamin B12 deficiency  Tobacco use disorder Significant tobacco use we spent some time over tobacco cessation counseling at this visit      Current smoking consumption amount: 1ppd   Dicsussion on advise to quit smoking and smoking impacts: throat cancer recurrence, copd    Patient's willingness to quit:  Not ready    Methods to quit smoking discussed:  Nicotine replacement   Medication management of smoking session drugs discussed: nicotine  lozenges   Resources provided:  AVS    Setting quit date not as of yet   Follow-up arranged 6 weeks   Time spent counseling the patient:  41min    [place] was seen today for shortness of breath.  Diagnoses and all orders for this visit:  COPD with chronic bronchitis ([place]) -     DG Chest 2 View; Future  Anxiety -     hydrOXYzine (ATARAX/VISTARIL) 25 MG tablet; Take 1 tablet (25 mg total) by mouth 3 (three) times daily as needed for anxiety.  Hypothyroidism due to acquired atrophy of thyroid -     levothyroxine (SYNTHROID) 175 MCG tablet; Take 1 tablet (175 mcg total) by mouth daily before breakfast. For thyroid hormone replacement  Hypertension, unspecified type -     metoprolol succinate (TOPROL-XL) 25 MG 24 hr tablet; Take 1 tablet (25 mg total) by mouth daily. For high blood pressure  Paresthesias -     gabapentin (NEURONTIN) 300 MG capsule;  Take 1 capsule (300 mg total) by mouth 3 (three) times daily.  Shortness of breath -     Fluticasone-Salmeterol (ADVAIR DISKUS) 250-50 MCG/DOSE AEPB; Inhale 1 puff into the lungs 2 (two) times daily.  Insomnia, unspecified type -     mirtazapine (REMERON) 7.5 MG tablet; Take 1 tablet (7.5 mg total) by mouth at bedtime. For sleep -     traZODone (DESYREL) 150 MG tablet; Take 1 tablet (150 mg total) by mouth at bedtime as needed for sleep.  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 -     Ambulatory referral to Oncology  Hx of radiation therapy -     Ambulatory referral to Oncology  Acute bronchitis, unspecified organism -     DG Chest 2 View; Future   Had televisit 12/16 for sore tht.  Needs ENT:   No insurance is a barrier.     [facility] was referred 12/7   ?f/u?   rx pred/abx/mouthwash   ?change to HFA advair?      Shortness of Breath This is a chronic problem. The current episode started more than 1 year ago. The problem occurs daily. The problem has been  gradually worsening. Associated symptoms include orthopnea, PND, sputum production and wheezing. Pertinent negatives include no chest pain, headaches or hemoptysis. The symptoms are aggravated by smoke, odors, exercise and any activity. Associated symptoms comments: Mucus is clear . Risk factors include smoking. He has tried beta agonist inhalers and steroid inhalers for the symptoms. The treatment provided mild relief. His past medical history is significant for COPD. There is no history of asthma, bronchiolitis, CAD, DVT, PE or pneumonia.   Past Medical History:  Diagnosis Date  . COPD (chronic obstructive pulmonary disease) ([place])   . Headache(784.0)   . Heart murmur    child  . Hx of radiation therapy 05/19/13- 06/27/13   glottic larynx, right true vocal cord 6300 cGy 28 sessions  . Hypothyroidism   . Influenza B 08/01/2018  . Long term (current) use of opiate analgesic 10/11/2016  . Polysubstance abuse ([place])   . Shortness of breath   . Squamous cell carcinoma of vocal cord ([place]) 09/11/2013  . Vocal cord cancer ([place]) 05/01/2013     Family History  Problem Relation Age of Onset  . COPD Mother   . Emphysema Mother   . Heart attack Father      Social History   Socioeconomic History  . Marital status: Legally Separated    Spouse name: Not on file  . Number of children: 3  . Years of education: Not on file  . 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 Smoking status: Current Every Day Smoker    Packs/day: 1.00    Years: 34.00    Pack years: 34.00    Types: Cigarettes  . Smokeless tobacco: Never Used  . Tobacco comment: DOWN TO 4 - 5 CIGARETTES A DAY  Substance and Sexual Activity  . Alcohol use: No    Alcohol/week: 0.0 standard drinks  . Drug use: Not Currently    Types: Marijuana, Cocaine, Heroin    Comment: He hasn't used drugs in "over a year"- 06/27/19  . Sexual activity: Yes    Birth  control/protection: Condom  Other Topics Concern  . Not on file  Social History Narrative   The patient is married. Patient has  3 children. 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   The patient has a history of smoking 2 packs of cigarette a day for 34 years. Patient current is trying to quit smoking. Patient denies use of alcohol. Patient denies use of other illicit drugs.   Social Determinants of Health   Financial Resource Strain:   . Difficulty of Paying Living Expenses: Not on file  Food Insecurity:   . Worried About Charity fundraiser in the Last Year: Not on file  . Ran Out of Food in the Last Year: Not on file  Transportation Needs: No Transportation Needs  . Lack of Transportation (Medical): No  . Lack of Transportation (Non-Medical): No  Physical Activity:   . Days of Exercise per Week: Not on file  . Minutes of Exercise per Session: Not on file  Stress:   . Feeling of Stress : Not on file  Social Connections:   . Frequency of Communication with Friends and Family: Not on file  . Frequency of Social Gatherings with Friends and Family: Not on file  . Attends Religious Services: Not on file  . Active Member of Clubs or Organizations: Not on file  . Attends Archivist Meetings: Not on file  . Marital Status: Not on file  Intimate Partner Violence: Not At Risk  . Fear of Current or Ex-Partner: No  . Emotionally Abused: No  . Physically Abused: No  . Sexually Abused: No     Allergies  Allergen Reactions  . Fentanyl Nausea And Vomiting  . Citalopram Other (See Comments)    Dizziness and shakiness.  . Morphine And Related Nausea And Vomiting     Outpatient Medications Prior to Visit  Medication Sig Dispense Refill  . albuterol (VENTOLIN HFA) 108 (90 Base) MCG/ACT inhaler Inhale 2 puffs into the lungs every 4 (four) hours as needed for wheezing or shortness of breath. 18 g 1  . amoxicillin-clavulanate (AUGMENTIN) 500-125 MG tablet Take 1 tablet (500 mg  total) by mouth 2 (two) times daily. Take after eating 14 tablet 0  . Fluticasone-Salmeterol (ADVAIR DISKUS) 250-50 MCG/DOSE AEPB Inhale 1 puff into the lungs 2 (two) times daily. 1 each 3  . gabapentin (NEURONTIN) 300 MG capsule Take 1 capsule (300 mg total) by mouth 3 (three) times daily. 90 capsule 3  . hydrOXYzine (ATARAX/VISTARIL) 25 MG tablet Take 1 tablet (25 mg total) by mouth 3 (three) times daily as needed for anxiety. 60 tablet 0  . levothyroxine (SYNTHROID) 175 MCG tablet Take 1 tablet (175 mcg total) by mouth daily before breakfast. For thyroid hormone replacement 30 tablet 2  . metoprolol succinate (TOPROL-XL) 25 MG 24 hr tablet Take 1 tablet (25 mg total) by mouth daily. For high blood pressure 30 tablet 2  . mirtazapine (REMERON) 7.5 MG tablet Take 1 tablet (7.5 mg total) by mouth at bedtime. For sleep 30 tablet 0  . nicotine (NICODERM CQ - DOSED IN MG/24 HOURS) 14 mg/24hr patch Apply 14mg  patch daily x 6 wk, then 7 mg patch daily x 2 wk (Patient not taking: Reported on 07/09/2019) 14 patch 2  . nicotine (NICODERM CQ - DOSED IN MG/24 HR) 7 mg/24hr patch Apply 14mg  patch daily x 6 wk, then 7 mg patch daily x 2 wk (Patient not taking: Reported on 07/09/2019) 14 patch 0  . predniSONE (DELTASONE) 20 MG tablet 2 pills once daily x 5 days, take after eating 10 tablet 0  . traZODone (DESYREL) 150 MG  tablet Take 1 tablet (150 mg total) by mouth at bedtime as needed for sleep. 30 tablet 3  . vitamin B-12 (CYANOCOBALAMIN) 1000 MCG tablet Take 1 tablet (1,000 mcg total) by mouth daily. 60 tablet 3   No facility-administered medications prior to visit.     Review of Systems  Respiratory: Positive for sputum production, shortness of breath and wheezing. Negative for hemoptysis.   Cardiovascular: Positive for orthopnea and PND. Negative for chest pain.  Neurological: Negative for headaches.       Objective:   Physical Exam  There were no vitals taken for this visit.  Gen: Pleasant,  well-nourished, in no distress,  normal affect  ENT: No lesions,  mouth clear,  oropharynx clear, no postnasal drip  Neck: No JVD, no TMG, no carotid bruits  Lungs: No use of accessory muscles, no dullness to percussion, clear without rales or rhonchi  Cardiovascular: RRR, heart sounds normal, no murmur or gallops, no peripheral edema  Abdomen: soft and NT, no HSM,  BS normal  Musculoskeletal: No deformities, no cyanosis or clubbing  Neuro: alert, non focal  Skin: Warm, no lesions or rashes      Assessment & Plan:  I personally reviewed all images and lab data in the [facility] system as well as any outside material available during this office visit and agree with the  radiology impressions.   No problem-specific Assessment & Plan notes found for this encounter.   There are no diagnoses linked to this encounter.

## 2019-09-30 ENCOUNTER — Other Ambulatory Visit: Payer: Self-pay | Admitting: Critical Care Medicine

## 2019-09-30 DIAGNOSIS — G4700 Insomnia, unspecified: Secondary | ICD-10-CM

## 2019-09-30 MED FILL — TRAZODONE HCL 150 MG TABS: 150 | 30 days supply | Qty: 30 | Fill #1

## 2019-09-30 MED FILL — MIRTAZAPINE 7.5 MG TABLET: 7.5 | 30 days supply | Qty: 30 | Fill #0

## 2019-09-30 MED FILL — LEVOTHYROXINE 175 MCG TAB: 175 | 30 days supply | Qty: 30 | Fill #1

## 2019-11-10 ENCOUNTER — Other Ambulatory Visit: Payer: Self-pay | Admitting: Critical Care Medicine

## 2019-11-10 DIAGNOSIS — G4700 Insomnia, unspecified: Secondary | ICD-10-CM

## 2019-11-10 MED FILL — ?TRAZODONE HCL 150MG TAB: 150 | 30 days supply | Qty: 30 | Fill #2

## 2019-11-10 MED FILL — GABAPENTIN 300 MG CAPSULE: 300 | 30 days supply | Qty: 90 | Fill #1

## 2019-11-10 MED FILL — METOPROLOL SUCCINATE ER 25: 25 | 30 days supply | Qty: 30 | Fill #1

## 2019-11-10 MED FILL — $ADVAIR 250/50 MCG INHALER: 250-50 | 30 days supply | Qty: 60 | Fill #1

## 2019-11-28 MED FILL — ?LEVOTHYROXINE SODIUM 175 M: 175 | 30 days supply | Qty: 30 | Fill #2

## 2019-12-08 MED FILL — ?TRAZODONE HCL 150MG TAB: 150 | 30 days supply | Qty: 30 | Fill #3

## 2020-01-05 NOTE — Progress Notes (Signed)
Subjective:    Patient ID: [name], male    DOB: 1966-06-07, 54 y.o.   MRN: [id number]  53 y.o.M here for f/u COPD Homelessnes This patient [place] and was seen previously in the clinic earlier in November by our physician assistant.  Documentation from that visit is as below.   [name] is a 54 y.o. male here to establish care and as hospital follow up.  He went to ED for med RF.  Homeless.  Out of thyroid meds ~ 1 month before restarting thyroid meds on 05/13/2019.  Denies drinking much alcohol.  C/o paresthesias in fingers and toes not controlled on gabapentin 200mg  tid.  Also severe fatigue.  Poor breathing.  Still smoking but trying to cut back.  H/o thyroid CA s/p radiation.     GABA did not help   1. Paresthesias - Thyroid Panel With TSH - gabapentin (NEURONTIN) 300 MG capsule; Take 1 capsule (300 mg total) by mouth 3 (three) times daily.  Dispense: 90 capsule; Refill: 3 - Vitamin B12 - Folate  2. Hypothyroidism due to acquired atrophy of thyroid Will not adjust based on this reading bc he just resumed meds 10/20 - Thyroid Panel With TSH - levothyroxine (SYNTHROID) 175 MCG tablet; Take 1 tablet (175 mcg total) by mouth daily before breakfast. For thyroid hormone replacement  Dispense: 30 tablet; Refill: 2  3. Fatigue, unspecified type  4. Insomnia, unspecified type  - mirtazapine (REMERON) 7.5 MG tablet; Take 1 tablet (7.5 mg total) by mouth at bedtime. For sleep  Dispense: 30 tablet; Refill: 0 - traZODone (DESYREL) 150 MG tablet; Take 1 tablet (150 mg total) by mouth at bedtime as needed for sleep.  Dispense: 30 tablet; Refill: 3  5. Anxiety - hydrOXYzine (ATARAX/VISTARIL) 25 MG tablet; Take 1 tablet (25 mg total) by mouth 3 (three) times daily as needed for anxiety.  Dispense: 60 tablet; Refill: 0  6. Hypertension, unspecified type Not controlled-resume meds - metoprolol succinate (TOPROL-XL) 25 MG 24 hr tablet;  Take 1 tablet (25 mg total) by mouth daily. For high blood pressure  Dispense: 30 tablet; Refill: 2  7. Homeless I am - Ambulatory referral to Social Work     8. Shortness of breath -unclear if he has emphysema or COPD?;  Will add- - Fluticasone-Salmeterol (ADVAIR DISKUS) 250-50 MCG/DOSE AEPB; Inhale 1 puff into the lungs 2 (two) times daily.  Dispense: 1 each; Refill: 3  The patient states the insomnia has been improved the use of Remeron and trazodone.  He states hydroxyzine also has helped the anxiety levels.  He also is taking the metoprolol 25 mg daily and this has been helpful as well.  He is using the Advair as well at 1 puff twice a day and this is helped his breathing.  He has not had a recent chest x-ray.  The patient also resume the levothyroxine and note lab results in the last visit showed still hypothyroidism and part of this was he was out of medication.  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  He has not been seen in follow-up in some time.  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  He also does not drink  alcohol.  He is still smoking however a pack a day of cigarettes  The patient does have very mild dysphagia at this time Note B12 levels did come back low and these were checked to evaluate for paresthesias therefore we will need to r begin B12 supplementation  01/05/2020 Since the last visit in December 2020 the patient continues to smoke a half a pack a day. He is maintaining his blood pressure medications. He has had no further flareups of his lung disease but has run out of his inhalers. The patient's not been emergency room for his breathing since the last visit. He is living in a tent encampment time friend's home in [place]. He  still has some swallowing issues and is edentulous. He states his dentures did not fit him very well. He is able to maintain some nutrition. Note his weight is down to 141 with a BMI of 19.6  He was not able to achieve an ENT appointment to follow-up on prior history of vocal cord cancer status post radiation to the neck. The cancer center stated he would need to see an ENT for evaluation. The patient is requesting 90-day refills on medications. He has been compliant with his Synthroid. He needs follow-up labs on the Synthroid. He is yet to receive his Covid vaccine.  Shortness of Breath This is a chronic problem. The current episode started more than 1 year ago. The problem occurs daily. The problem has been gradually worsening. Associated symptoms include sputum production. Pertinent negatives include no chest pain, headaches, hemoptysis, orthopnea, PND or wheezing. The symptoms are aggravated by smoke, odors, exercise and any activity. Associated symptoms comments: Mucus is clear . Risk factors include smoking. He has tried beta agonist inhalers and steroid inhalers for the symptoms. The treatment provided mild relief. His past medical history is significant for COPD. There is no history of asthma, bronchiolitis, CAD, DVT, PE or pneumonia.   Past Medical History:  Diagnosis Date  . COPD (chronic obstructive pulmonary disease) ([place])   . Headache(784.0)   . Heart murmur    child  . Hx of radiation therapy 05/19/13- 06/27/13   glottic larynx, right true vocal cord 6300 cGy 28 sessions  . Hypothyroidism   . Influenza B 08/01/2018  . Long term (current) use of opiate analgesic 10/11/2016  . Polysubstance abuse ([place])   . Shortness of breath   . 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 Vocal cord cancer ([place]) 05/01/2013     Family History  Problem Relation Age of Onset  . COPD Mother   . Emphysema Mother   . Heart attack Father      Social History   Socioeconomic History  .  Marital status: Legally Separated    Spouse name: Not on file  . Number of children: 3  . Years of education: Not on file  . 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 Smoking status: Current Every Day Smoker    Packs/day: 1.00    Years: 34.00    Pack years: 34.00    Types: Cigarettes  . Smokeless tobacco: Never Used  . Tobacco comment: DOWN TO 4 - 5 CIGARETTES A DAY  Vaping Use  . Vaping Use: Never used  Substance and Sexual Activity  . Alcohol use: No    Alcohol/week: 0.0 standard drinks  . Drug use: Not Currently  Types: Marijuana, Cocaine, Heroin    Comment: He hasn't used drugs in "over a year"- 06/27/19  . Sexual activity: Yes    Birth control/protection: Condom  Other Topics Concern  . Not on file  Social History Narrative   The patient is married. Patient has 3 children. 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   The patient has a history of smoking 2 packs of cigarette a day for 34 years. Patient current is trying to quit smoking. Patient denies use of alcohol. Patient denies use of other illicit drugs.   Social Determinants of Health   Financial Resource Strain:   . Difficulty of Paying Living Expenses:   Food Insecurity:   . Worried About Charity fundraiser in the Last Year:   . Arboriculturist in the Last Year:   Transportation Needs: No Transportation Needs  . Lack of Transportation (Medical): No  . Lack of Transportation (Non-Medical): No  Physical Activity:   . Days of Exercise per Week:   . Minutes of Exercise per Session:   Stress:   . Feeling of Stress :   Social Connections:   . Frequency of Communication with Friends and Family:   . Frequency of Social Gatherings with Friends and Family:   . Attends Religious Services:   . Active Member of Clubs or Organizations:   . Attends Archivist Meetings:   Marland Kitchen Marital Status:   Intimate Partner  Violence: Not At Risk  . Fear of Current or Ex-Partner: No  . Emotionally Abused: No  . Physically Abused: No  . Sexually Abused: No     Allergies  Allergen Reactions  . Fentanyl Nausea And Vomiting  . Citalopram Other (See Comments)    Dizziness and shakiness.  . Morphine And Related Nausea And Vomiting     Outpatient Medications Prior to Visit  Medication Sig Dispense Refill  . hydrOXYzine (ATARAX/VISTARIL) 25 MG tablet Take 1 tablet (25 mg total) by mouth 3 (three) times daily as needed for anxiety. 60 tablet 0  . albuterol (VENTOLIN HFA) 108 (90 Base) MCG/ACT inhaler Inhale 2 puffs into the lungs every 4 (four) hours as needed for wheezing or shortness of breath. 18 g 1  . amoxicillin-clavulanate (AUGMENTIN) 500-125 MG tablet Take 1 tablet (500 mg total) by mouth 2 (two) times daily. Take after eating 14 tablet 0  . Fluticasone-Salmeterol (ADVAIR DISKUS) 250-50 MCG/DOSE AEPB Inhale 1 puff into the lungs 2 (two) times daily. 1 each 3  . gabapentin (NEURONTIN) 300 MG capsule Take 1 capsule (300 mg total) by mouth 3 (three) times daily. 90 capsule 3  . levothyroxine (SYNTHROID) 175 MCG tablet Take 1 tablet (175 mcg total) by mouth daily before breakfast. For thyroid hormone replacement 30 tablet 2  . metoprolol succinate (TOPROL-XL) 25 MG 24 hr tablet Take 1 tablet (25 mg total) by mouth daily. For high blood pressure 30 tablet 2  . mirtazapine (REMERON) 7.5 MG tablet Take 1 tablet (7.5 mg total) by mouth at bedtime. For sleep. Must have office visit for refills 30 tablet 0  . traZODone (DESYREL) 150 MG tablet Take 1 tablet (150 mg total) by mouth at bedtime as needed for sleep. 30 tablet 3  . vitamin B-12 (CYANOCOBALAMIN) 1000 MCG tablet Take 1 tablet (1,000 mcg total) by mouth daily. 60 tablet 3  . nicotine (NICODERM CQ - DOSED IN MG/24 HOURS) 14 mg/24hr patch Apply 14mg  patch daily x 6 wk, then 7 mg patch daily  x 2 wk (Patient not taking: Reported on 07/09/2019) 14 patch 2  . nicotine  (NICODERM CQ - DOSED IN MG/24 HR) 7 mg/24hr patch Apply 14mg  patch daily x 6 wk, then 7 mg patch daily x 2 wk (Patient not taking: Reported on 07/09/2019) 14 patch 0  . predniSONE (DELTASONE) 20 MG tablet 2 pills once daily x 5 days, take after eating 10 tablet 0   No facility-administered medications prior to visit.     Review of Systems  Respiratory: Positive for sputum production and shortness of breath. Negative for hemoptysis and wheezing.   Cardiovascular: Negative for chest pain, orthopnea and PND.  Neurological: Negative for headaches.       Objective:   Physical Exam  BP 118/79 (BP Location: Left Arm, Patient Position: Sitting, Cuff Size: Normal)   Pulse 73   Temp 97.7 F (36.5 C) (Temporal)   Ht 5\' 11"  (1.803 m)   Wt 141 lb (64 kg)   SpO2 100%   BMI 19.67 kg/m   Gen: Pleasant, thin, in no distress,  normal affect  ENT: No lesions,  mouth clear,  oropharynx clear, no postnasal drip, edentulous  Neck: No JVD, no TMG, no carotid bruits  Lungs: No use of accessory muscles, no dullness to percussion, distant breath sounds, no wheezes or rhonchi  Cardiovascular: RRR, heart sounds normal, no murmur or gallops, no peripheral edema  Abdomen: soft and NT, no HSM,  BS normal  Musculoskeletal: No deformities, no cyanosis or clubbing  Neuro: alert, non focal  Skin: Warm, no lesions or rashes      Assessment & Plan:  I personally reviewed all images and lab data in the [facility] system as well as any outside material available during this office visit and agree with the  radiology impressions.   COPD with chronic bronchitis ([place]) COPD with chronic bronchitis and emphysema Plan is to refill Advair and albuterol he does not need prednisone or antibiotics at this time  I spent 5 minutes going over smoking cessation counseling with this patient  Hypothyroidism due to acquired atrophy of thyroid Hypothyroidism due to acquired atrophy of the thyroid  Plan will be to refill  Synthroid at 175 mcg daily and recheck thyroid function at this visit  Cocaine abuse with cocaine-induced mood disorder [facility]) Patient states he is currently not using cocaine at this time and does not show evidence of cocaine induced mood disorder at this visit  Major depressive disorder, recurrent episode, severe ([place]) Major mood disorder with depression I will ask time off medication other than the use of as needed hydroxyzine  Paresthesias Chronic paresthesias and neuropathy stable on gabapentin  Hypertension Hypertension well controlled on metoprolol  Homelessness The patient was offered resources for homelessness and he has declined to take these up  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 History of squamous cell carcinoma vocal cord dysfunction treated with radiation therapy in 2014  We will have the patient see the ENT physician will be volunteering at our clinic for follow-up   [name] was seen today for medication refill.  Diagnoses and all orders for this visit:  Hypothyroidism due to acquired atrophy of thyroid -     levothyroxine (SYNTHROID) 175 MCG tablet; Take 1 tablet (175 mcg total) by mouth daily before breakfast. For thyroid hormone replacement -     Thyroid Panel With TSH  Shortness of breath -     Fluticasone-Salmeterol (ADVAIR DISKUS) 250-50 MCG/DOSE AEPB; Inhale 1 puff into  the lungs 2 (two) times daily.  Paresthesias -     gabapentin (NEURONTIN) 300 MG capsule; Take 1 capsule (300 mg total) by mouth 3 (three) times daily.  Hypertension, unspecified type -     metoprolol succinate (TOPROL-XL) 25 MG 24 hr tablet; Take 1 tablet (25 mg total) by mouth daily. For high blood pressure  Insomnia, unspecified type -     mirtazapine (REMERON) 7.5 MG tablet; Take 1 tablet (7.5 mg total) by mouth at bedtime. For sleep. Must have office visit for refills -     traZODone (DESYREL) 150 MG tablet; Take 1 tablet (150 mg total) by mouth at  bedtime as needed for sleep.  Tobacco use disorder  Pulmonary emphysema, unspecified emphysema type ([facility])  Severe episode of recurrent major depressive disorder, without psychotic features ([place])  COPD with chronic bronchitis ([facility])  Cocaine abuse with cocaine-induced mood disorder ([place])  Homelessness  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  Other orders -     albuterol (VENTOLIN HFA) 108 (90 Base) MCG/ACT inhaler; Inhale 2 puffs into the lungs every 4 (four) hours as needed for wheezing or shortness of breath. -     vitamin B-12 (CYANOCOBALAMIN) 1000 MCG tablet; Take 1 tablet (1,000 mcg total) by mouth daily.  We are going to arrange for the patient receive a Covid vaccine in his tent encampment as he is homebound and has poor transportation

## 2020-01-06 ENCOUNTER — Encounter: Payer: Self-pay | Admitting: Critical Care Medicine

## 2020-01-06 ENCOUNTER — Other Ambulatory Visit: Payer: Self-pay

## 2020-01-06 ENCOUNTER — Ambulatory Visit: Payer: Self-pay | Attending: Critical Care Medicine | Admitting: Critical Care Medicine

## 2020-01-06 VITALS — BP 118/79 | HR 73 | Temp 97.7°F | Ht 71.0 in | Wt 141.0 lb

## 2020-01-06 DIAGNOSIS — R202 Paresthesia of skin: Secondary | ICD-10-CM

## 2020-01-06 DIAGNOSIS — Z5900 Homelessness unspecified: Secondary | ICD-10-CM

## 2020-01-06 DIAGNOSIS — G4700 Insomnia, unspecified: Secondary | ICD-10-CM

## 2020-01-06 DIAGNOSIS — I10 Essential (primary) hypertension: Secondary | ICD-10-CM

## 2020-01-06 DIAGNOSIS — F332 Major depressive disorder, recurrent severe without psychotic features: Secondary | ICD-10-CM

## 2020-01-06 DIAGNOSIS — F1414 Cocaine abuse with cocaine-induced mood disorder: Secondary | ICD-10-CM

## 2020-01-06 DIAGNOSIS — J449 Chronic obstructive pulmonary disease, unspecified: Secondary | ICD-10-CM

## 2020-01-06 DIAGNOSIS — R0602 Shortness of breath: Secondary | ICD-10-CM

## 2020-01-06 DIAGNOSIS — F17200 Nicotine dependence, unspecified, uncomplicated: Secondary | ICD-10-CM

## 2020-01-06 DIAGNOSIS — E034 Atrophy of thyroid (acquired): Secondary | ICD-10-CM

## 2020-01-06 DIAGNOSIS — J439 Emphysema, unspecified: Secondary | ICD-10-CM

## 2020-01-06 DIAGNOSIS — Z8589 Personal history of malignant neoplasm of other organs and systems: Secondary | ICD-10-CM

## 2020-01-06 MED ORDER — VITAMIN B-12 1000 MCG PO TABS: 1000.0000 ug | ORAL_TABLET | Freq: Every day | ORAL | 3 refills | Status: DC

## 2020-01-06 MED ORDER — TRAZODONE HCL 150 MG PO TABS: 150.0000 mg | ORAL_TABLET | Freq: Every evening | ORAL | 3 refills | Status: DC | PRN

## 2020-01-06 MED ORDER — FLUTICASONE-SALMETEROL 250-50 MCG/DOSE IN AEPB: 1.0000 | INHALATION_SPRAY | Freq: Two times a day (BID) | RESPIRATORY_TRACT | 3 refills | Status: DC

## 2020-01-06 MED ORDER — GABAPENTIN 300 MG PO CAPS: 300.0000 mg | ORAL_CAPSULE | Freq: Three times a day (TID) | ORAL | 3 refills | Status: DC

## 2020-01-06 MED ORDER — METOPROLOL SUCCINATE ER 25 MG PO TB24: 25.0000 mg | ORAL_TABLET | Freq: Every day | ORAL | 2 refills | Status: DC

## 2020-01-06 MED ORDER — ALBUTEROL SULFATE HFA 108 (90 BASE) MCG/ACT IN AERS: 2.0000 | INHALATION_SPRAY | RESPIRATORY_TRACT | 1 refills | Status: DC | PRN

## 2020-01-06 MED ORDER — LEVOTHYROXINE SODIUM 175 MCG PO TABS: 175.0000 ug | ORAL_TABLET | Freq: Every day | ORAL | 2 refills | Status: DC

## 2020-01-06 MED ORDER — MIRTAZAPINE 7.5 MG PO TABS: 7.5000 mg | ORAL_TABLET | Freq: Every day | ORAL | 0 refills | Status: DC

## 2020-01-06 MED FILL — ?METOPROLOL SUCC ER 25MG TA: 25 | 30 days supply | Qty: 30 | Fill #0

## 2020-01-06 MED FILL — MIRTAZAPINE 7.5 MG TABLET: 7.5 | 30 days supply | Qty: 30 | Fill #0

## 2020-01-06 MED FILL — $ADVAIR 250/50 MCG INHALER: 250-50 | 30 days supply | Qty: 60 | Fill #0

## 2020-01-06 MED FILL — ?TRAZODONE HCL 150MG TAB: 150 | 30 days supply | Qty: 30 | Fill #0

## 2020-01-06 MED FILL — ?LEVOTHYROXINE SODIUM 175 M: 175 | 30 days supply | Qty: 30 | Fill #0

## 2020-01-06 MED FILL — ALBUTEROL SULFATE HFA 108 (: 108 (90 BAS | 25 days supply | Qty: 18 | Fill #0

## 2020-01-06 MED FILL — GABAPENTIN 300 MG CAPSULE: 300 | 30 days supply | Qty: 90 | Fill #0

## 2020-01-06 NOTE — Assessment & Plan Note (Signed)
Chronic paresthesias and neuropathy stable on gabapentin

## 2020-01-06 NOTE — Assessment & Plan Note (Signed)
Hypothyroidism due to acquired atrophy of the thyroid  Plan will be to refill Synthroid at 175 mcg daily and recheck thyroid function at this visit

## 2020-01-06 NOTE — Assessment & Plan Note (Signed)
Patient states he is currently not using cocaine at this time and does not show evidence of cocaine induced mood disorder at this visit

## 2020-01-06 NOTE — Assessment & Plan Note (Signed)
COPD with chronic bronchitis and emphysema Plan is to refill Advair and albuterol he does not need prednisone or antibiotics at this time  I spent 5 minutes going over smoking cessation counseling with this patient

## 2020-01-06 NOTE — Assessment & Plan Note (Signed)
The patient was offered resources for homelessness and he has declined to take these up

## 2020-01-06 NOTE — Assessment & Plan Note (Signed)
History of squamous cell carcinoma vocal cord dysfunction treated with radiation therapy in 2014  We will have the patient see the ENT physician will be volunteering at our clinic for follow-up

## 2020-01-06 NOTE — Patient Instructions (Signed)
Refills on medications sent to our pharmacy A thyroid profile will be obtained We will have you see our ENT doctor [facility] when he starts at [facility] We will schedule you a covid vaccine at your home , someone will call your daughter

## 2020-01-06 NOTE — Assessment & Plan Note (Signed)
Hypertension well controlled on metoprolol

## 2020-01-06 NOTE — Assessment & Plan Note (Signed)
Major mood disorder with depression I will ask time off medication other than the use of as needed hydroxyzine

## 2020-01-07 LAB — THYROID PANEL WITH TSH
Free Thyroxine Index: 1.6 (ref 1.2–4.9)
T3 Uptake Ratio: 29 % (ref 24–39)
T4, Total: 5.6 ug/dL (ref 4.5–12.0)
TSH: 2.11 u[IU]/mL (ref 0.450–4.500)

## 2020-02-03 ENCOUNTER — Ambulatory Visit: Payer: Self-pay | Attending: Critical Care Medicine

## 2020-02-03 DIAGNOSIS — Z23 Encounter for immunization: Secondary | ICD-10-CM

## 2020-02-03 NOTE — Progress Notes (Signed)
   Covid-19 Vaccination Clinic  Name:  KYRIE FLUDD    MRN: [id number] DOB: Dec 10, 1965  02/03/2020  Mr. Marcello was observed post Covid-19 immunization for 15 minutes without incident. He was provided with Vaccine Information Sheet and instruction to access the V-Safe system.   Mr. Strubel was instructed to call 911 with any severe reactions post vaccine: Marland Kitchen Difficulty breathing  . Swelling of face and throat  . A fast heartbeat  . A bad rash all over body  . Dizziness and weakness   Immunizations Administered    Name Date Dose VIS Date Route   Moderna COVID-19 Vaccine 02/03/2020  1:05 AM 0.5 mL 06/2019 Intramuscular   Manufacturer: Moderna   Lot: [id number]   [place]: 67255-001-64

## 2020-03-02 ENCOUNTER — Ambulatory Visit: Payer: Self-pay | Attending: Critical Care Medicine

## 2020-03-02 ENCOUNTER — Ambulatory Visit: Payer: Self-pay

## 2020-03-02 DIAGNOSIS — Z23 Encounter for immunization: Secondary | ICD-10-CM

## 2020-03-02 NOTE — Progress Notes (Signed)
° °  Covid-19 Vaccination Clinic  Name:  [name]    MRN: [id number] DOB: 10/20/1965  03/02/2020  [name] was observed post Covid-19 immunization for 15 minutes without incident. He was provided with Vaccine Information Sheet and instruction to access the V-Safe system.   [name] was instructed to call 911 with any severe reactions post vaccine:  Difficulty breathing   Swelling of face and throat   A fast heartbeat   A bad rash all over body   Dizziness and weakness   Immunizations Administered    Name Date Dose VIS Date Route   Moderna COVID-19 Vaccine 03/02/2020  9:58 AM 0.5 mL 06/2019 Intramuscular   Manufacturer: Moderna   Lot: [id number]   [place]: [id number]

## 2020-03-15 ENCOUNTER — Telehealth: Payer: Self-pay | Admitting: *Deleted

## 2020-03-15 NOTE — Telephone Encounter (Signed)
MA spoke with patients daughter who provided his new cell phone number that she activated for him today. Patient did not answer x3.Marland Kitchen LVM for a return call.

## 2020-03-15 NOTE — Telephone Encounter (Signed)
-----   Message from [name], MD sent at 03/02/2020  9:07 PM EDT ----- Not surprised, he is homeless ----- Message ----- From: Arabella Merles, RN Sent: 03/02/2020   3:55 PM EDT To: [name], MD  Unable to relate results / left multiple messages on his daughter cell.

## 2020-04-07 ENCOUNTER — Encounter: Payer: Self-pay | Admitting: Critical Care Medicine

## 2020-04-07 ENCOUNTER — Other Ambulatory Visit: Payer: Self-pay | Admitting: Critical Care Medicine

## 2020-04-07 ENCOUNTER — Other Ambulatory Visit: Payer: Self-pay

## 2020-04-07 ENCOUNTER — Ambulatory Visit: Payer: Self-pay | Attending: Critical Care Medicine | Admitting: Critical Care Medicine

## 2020-04-07 VITALS — BP 110/77 | HR 79 | Temp 98.2°F | Resp 16 | Ht 71.0 in | Wt 148.0 lb

## 2020-04-07 DIAGNOSIS — F332 Major depressive disorder, recurrent severe without psychotic features: Secondary | ICD-10-CM

## 2020-04-07 DIAGNOSIS — E034 Atrophy of thyroid (acquired): Secondary | ICD-10-CM

## 2020-04-07 DIAGNOSIS — Z5900 Homelessness unspecified: Secondary | ICD-10-CM

## 2020-04-07 DIAGNOSIS — R0602 Shortness of breath: Secondary | ICD-10-CM

## 2020-04-07 DIAGNOSIS — I10 Essential (primary) hypertension: Secondary | ICD-10-CM

## 2020-04-07 DIAGNOSIS — F11221 Opioid dependence with intoxication delirium: Secondary | ICD-10-CM

## 2020-04-07 DIAGNOSIS — G4700 Insomnia, unspecified: Secondary | ICD-10-CM

## 2020-04-07 DIAGNOSIS — J449 Chronic obstructive pulmonary disease, unspecified: Secondary | ICD-10-CM

## 2020-04-07 DIAGNOSIS — F419 Anxiety disorder, unspecified: Secondary | ICD-10-CM

## 2020-04-07 DIAGNOSIS — F17200 Nicotine dependence, unspecified, uncomplicated: Secondary | ICD-10-CM

## 2020-04-07 DIAGNOSIS — F1414 Cocaine abuse with cocaine-induced mood disorder: Secondary | ICD-10-CM

## 2020-04-07 DIAGNOSIS — R202 Paresthesia of skin: Secondary | ICD-10-CM

## 2020-04-07 DIAGNOSIS — J4489 Other specified chronic obstructive pulmonary disease: Secondary | ICD-10-CM

## 2020-04-07 MED ORDER — LEVOTHYROXINE SODIUM 175 MCG PO TABS: 175.0000 ug | ORAL_TABLET | Freq: Every day | ORAL | 2 refills | Status: DC

## 2020-04-07 MED ORDER — ALBUTEROL SULFATE HFA 108 (90 BASE) MCG/ACT IN AERS
2.0000 | INHALATION_SPRAY | RESPIRATORY_TRACT | 1 refills | Status: DC | PRN
Start: 2020-04-07 — End: 2020-07-29

## 2020-04-07 MED ORDER — HYDROXYZINE HCL 25 MG PO TABS: 25.0000 mg | ORAL_TABLET | Freq: Three times a day (TID) | ORAL | 0 refills | Status: DC | PRN

## 2020-04-07 MED ORDER — TRAZODONE HCL 150 MG PO TABS: 150.0000 mg | ORAL_TABLET | Freq: Every evening | ORAL | 3 refills | Status: DC | PRN

## 2020-04-07 MED ORDER — FLUTICASONE-SALMETEROL 250-50 MCG/DOSE IN AEPB: 1.0000 | INHALATION_SPRAY | Freq: Two times a day (BID) | RESPIRATORY_TRACT | 3 refills | Status: DC

## 2020-04-07 MED ORDER — METOPROLOL SUCCINATE ER 25 MG PO TB24: 25.0000 mg | ORAL_TABLET | Freq: Every day | ORAL | 2 refills | Status: DC

## 2020-04-07 MED ORDER — VITAMIN B-12 1000 MCG PO TABS: 1000.0000 ug | ORAL_TABLET | Freq: Every day | ORAL | 3 refills | Status: DC

## 2020-04-07 MED ORDER — MIRTAZAPINE 7.5 MG PO TABS: 7.5000 mg | ORAL_TABLET | Freq: Every day | ORAL | 0 refills | Status: DC

## 2020-04-07 MED ORDER — GABAPENTIN 300 MG PO CAPS: 300.0000 mg | ORAL_CAPSULE | Freq: Three times a day (TID) | ORAL | 3 refills | Status: DC

## 2020-04-07 MED FILL — GABAPENTIN 300 MG CAPSULE: 300 | 30 days supply | Qty: 90 | Fill #0

## 2020-04-07 MED FILL — LEVOTHYROXINE SODIUM 175 MC: 175 | 30 days supply | Qty: 30 | Fill #0

## 2020-04-07 MED FILL — MIRTAZAPINE 7.5 MG TABLET: 7.5 | 30 days supply | Qty: 30 | Fill #0

## 2020-04-07 MED FILL — ?METOPROLOL SUCC ER 25MG TA: 25 | 30 days supply | Qty: 30 | Fill #0

## 2020-04-07 MED FILL — !VENTOLIN HFA INHALER: 108 (90 BAS | 25 days supply | Qty: 18 | Fill #1

## 2020-04-07 MED FILL — hydrOXYzine HCL 25 MG TABS: 25 | 20 days supply | Qty: 60 | Fill #0

## 2020-04-07 MED FILL — $ADVAIR 250/50 MCG INHALER: 250-50 | 90 days supply | Qty: 180 | Fill #1

## 2020-04-07 MED FILL — ?TRAZODONE HCL 150MG TAB: 150 | 30 days supply | Qty: 30 | Fill #0

## 2020-04-07 NOTE — Progress Notes (Signed)
Addresses no concerns today. Has not taken medication in 7 days.

## 2020-04-07 NOTE — Progress Notes (Signed)
Subjective:    Patient ID: [name], male    DOB: 05-08-1966, 54 y.o.   MRN: [id number]  06/24/19 53 y.o.M here for f/u COPD Homelessnes This patient has [place] and was seen previously in the clinic earlier in November by our physician assistant.  Documentation from that visit is as below.   [name] is a 54 y.o. male here to establish care and as hospital follow up.  He went to ED for med RF.  Homeless.  Out of thyroid meds ~ 1 month before restarting thyroid meds on 05/13/2019.  Denies drinking much alcohol.  C/o paresthesias in fingers and toes not controlled on gabapentin 200mg  tid.  Also severe fatigue.  Poor breathing.  Still smoking but trying to cut back.  H/o thyroid CA s/p radiation.     GABA did not help   1. Paresthesias - Thyroid Panel With TSH - gabapentin (NEURONTIN) 300 MG capsule; Take 1 capsule (300 mg total) by mouth 3 (three) times daily.  Dispense: 90 capsule; Refill: 3 - Vitamin B12 - Folate  2. Hypothyroidism due to acquired atrophy of thyroid Will not adjust based on this reading bc he just resumed meds 10/20 - Thyroid Panel With TSH - levothyroxine (SYNTHROID) 175 MCG tablet; Take 1 tablet (175 mcg total) by mouth daily before breakfast. For thyroid hormone replacement  Dispense: 30 tablet; Refill: 2  3. Fatigue, unspecified type  4. Insomnia, unspecified type  - mirtazapine (REMERON) 7.5 MG tablet; Take 1 tablet (7.5 mg total) by mouth at bedtime. For sleep  Dispense: 30 tablet; Refill: 0 - traZODone (DESYREL) 150 MG tablet; Take 1 tablet (150 mg total) by mouth at bedtime as needed for sleep.  Dispense: 30 tablet; Refill: 3  5. Anxiety - hydrOXYzine (ATARAX/VISTARIL) 25 MG tablet; Take 1 tablet (25 mg total) by mouth 3 (three) times daily as needed for anxiety.  Dispense: 60 tablet; Refill: 0  6. Hypertension, unspecified type Not controlled-resume meds - metoprolol succinate (TOPROL-XL) 25 MG 24 hr  tablet; Take 1 tablet (25 mg total) by mouth daily. For high blood pressure  Dispense: 30 tablet; Refill: 2  7. Homeless I am - Ambulatory referral to Social Work     8. Shortness of breath -unclear if he has emphysema or COPD?;  Will add- - Fluticasone-Salmeterol (ADVAIR DISKUS) 250-50 MCG/DOSE AEPB; Inhale 1 puff into the lungs 2 (two) times daily.  Dispense: 1 each; Refill: 3 01/06/20 The patient states the insomnia has been improved the use of Remeron and trazodone.  He states hydroxyzine also has helped the anxiety levels.  He also is taking the metoprolol 25 mg daily and this has been helpful as well.  He is using the Advair as well at 1 puff twice a day and this is helped his breathing.  He has not had a recent chest x-ray.  The patient also resume the levothyroxine and note lab results in the last visit showed still hypothyroidism and part of this was he was out of medication.  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  He has not been seen in follow-up in some time.  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  He also does not  drink alcohol.  He is still smoking however a pack a day of cigarettes  The patient does have very mild dysphagia at this time Note B12 levels did come back low and these were checked to evaluate for paresthesias therefore we will need to r begin B12 supplementation  01/05/2020 Since the last visit in December 2020 the patient continues to smoke a half a pack a day. He is maintaining his blood pressure medications. He has had no further flareups of his lung disease but has run out of his inhalers. The patient's not been emergency room for his breathing since the last visit. He is living in a tent encampment time friend's home in  [place]. He still has some swallowing issues and is edentulous. He states his dentures did not fit him very well. He is able to maintain some nutrition. Note his weight is down to 141 with a BMI of 19.6  He was not able to achieve an ENT appointment to follow-up on prior history of vocal cord cancer status post radiation to the neck. The cancer center stated he would need to see an ENT for evaluation. The patient is requesting 90-day refills on medications. He has been compliant with his Synthroid. He needs follow-up labs on the Synthroid. He is yet to receive his Covid vaccine.  04/07/2020 Patient returns in follow-up for his COPD and hypertension.  He now has a trailer to live down.  He complains of increased shortness of breath and cough.  He is smoking still but less.  He did receive his [place] vaccine through a homebound vaccine program  Shortness of Breath This is a chronic problem. The current episode started more than 1 year ago. The problem occurs daily. The problem has been gradually worsening. Associated symptoms include chest pain, ear pain, rhinorrhea, sputum production and wheezing. Pertinent negatives include no fever, headaches, hemoptysis, orthopnea, PND or vomiting. The symptoms are aggravated by smoke, odors, exercise and any activity. Associated symptoms comments: Mucus is clear  Voice gone Sore throat. Risk factors include smoking. He has tried beta agonist inhalers and steroid inhalers for the symptoms. The treatment provided mild relief. His past medical history is significant for COPD. There is no history of asthma, bronchiolitis, CAD, DVT, PE or pneumonia.   Past Medical History:  Diagnosis Date  . COPD (chronic obstructive pulmonary disease) ([place])   . Headache(784.0)   . Heart murmur    child  . Hx of radiation therapy 05/19/13- 06/27/13   glottic larynx, right true vocal cord 6300 cGy 28 sessions  . Hypothyroidism   . Influenza B 08/01/2018  . Long term (current)  use of opiate analgesic 10/11/2016  . Polysubstance abuse ([place])   . Shortness of breath   . 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 Vocal cord cancer ([place]) 05/01/2013     Family History  Problem Relation Age of Onset  . COPD Mother   . Emphysema Mother   . Heart attack Father      Social History   Socioeconomic History  . Marital status: Legally Separated    Spouse name: Not on file  . Number of children: 3  . Years of education: Not on file  . 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 Smoking status: Current Every Day Smoker    Packs/day: 1.00    Years: 34.00  Pack years: 34.00    Types: Cigarettes  . Smokeless tobacco: Never Used  . Tobacco comment: DOWN TO 4 - 5 CIGARETTES A DAY  Vaping Use  . Vaping Use: Never used  Substance and Sexual Activity  . Alcohol use: No    Alcohol/week: 0.0 standard drinks  . Drug use: Not Currently    Types: Marijuana, Cocaine, Heroin    Comment: He hasn't used drugs in "over a year"- 06/27/19  . Sexual activity: Yes    Birth control/protection: Condom  Other Topics Concern  . Not on file  Social History Narrative   The patient is married. Patient has 3 children. 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   The patient has a history of smoking 2 packs of cigarette a day for 34 years. Patient current is trying to quit smoking. Patient denies use of alcohol. Patient denies use of other illicit drugs.   Social Determinants of Health   Financial Resource Strain:   . Difficulty of Paying Living Expenses: Not on file  Food Insecurity:   . Worried About Charity fundraiser in the Last Year: Not on file  . Ran Out of Food in the Last Year: Not on file  Transportation Needs: No Transportation Needs  . Lack of Transportation (Medical): No  . Lack of Transportation (Non-Medical): No  Physical Activity:   . Days of Exercise per  Week: Not on file  . Minutes of Exercise per Session: Not on file  Stress:   . Feeling of Stress : Not on file  Social Connections:   . Frequency of Communication with Friends and Family: Not on file  . Frequency of Social Gatherings with Friends and Family: Not on file  . Attends Religious Services: Not on file  . Active Member of Clubs or Organizations: Not on file  . Attends Archivist Meetings: Not on file  . Marital Status: Not on file  Intimate Partner Violence: Not At Risk  . Fear of Current or Ex-Partner: No  . Emotionally Abused: No  . Physically Abused: No  . Sexually Abused: No     Allergies  Allergen Reactions  . Fentanyl Nausea And Vomiting  . Citalopram Other (See Comments)    Dizziness and shakiness.  . Morphine And Related Nausea And Vomiting     Outpatient Medications Prior to Visit  Medication Sig Dispense Refill  . albuterol (VENTOLIN HFA) 108 (90 Base) MCG/ACT inhaler Inhale 2 puffs into the lungs every 4 (four) hours as needed for wheezing or shortness of breath. 18 g 1  . Fluticasone-Salmeterol (ADVAIR DISKUS) 250-50 MCG/DOSE AEPB Inhale 1 puff into the lungs 2 (two) times daily. 1 each 3  . gabapentin (NEURONTIN) 300 MG capsule Take 1 capsule (300 mg total) by mouth 3 (three) times daily. 180 capsule 3  . hydrOXYzine (ATARAX/VISTARIL) 25 MG tablet Take 1 tablet (25 mg total) by mouth 3 (three) times daily as needed for anxiety. 60 tablet 0  . levothyroxine (SYNTHROID) 175 MCG tablet Take 1 tablet (175 mcg total) by mouth daily before breakfast. For thyroid hormone replacement 90 tablet 2  . metoprolol succinate (TOPROL-XL) 25 MG 24 hr tablet Take 1 tablet (25 mg total) by mouth daily. For high blood pressure 90 tablet 2  . mirtazapine (REMERON) 7.5 MG tablet Take 1 tablet (7.5 mg total) by mouth at bedtime. For sleep. Must have office visit for refills 30 tablet 0  . traZODone (DESYREL) 150 MG tablet Take 1  tablet (150 mg total) by mouth at bedtime  as needed for sleep. 90 tablet 3  . vitamin B-12 (CYANOCOBALAMIN) 1000 MCG tablet Take 1 tablet (1,000 mcg total) by mouth daily. 90 tablet 3   No facility-administered medications prior to visit.     Review of Systems  Constitutional: Negative for fever.  HENT: Positive for ear pain and rhinorrhea.   Respiratory: Positive for sputum production, shortness of breath and wheezing. Negative for hemoptysis.   Cardiovascular: Positive for chest pain. Negative for orthopnea and PND.  Gastrointestinal: Negative for vomiting.  Neurological: Negative for headaches.       Objective:   Physical Exam  BP 110/77   Pulse 79   Temp 98.2 F (36.8 C)   Resp 16   Ht 5\' 11"  (1.803 m)   Wt 148 lb (67.1 kg)   SpO2 99%   BMI 20.64 kg/m   Gen: Pleasant, thin, in no distress,  normal affect  ENT: No lesions,  mouth clear,  oropharynx clear, no postnasal drip, edentulous  Neck: No JVD, no TMG, no carotid bruits  Lungs: No use of accessory muscles, no dullness to percussion, distant breath sounds, no wheezes or rhonchi  Cardiovascular: RRR, heart sounds normal, no murmur or gallops, no peripheral edema  Abdomen: soft and NT, no HSM,  BS normal  Musculoskeletal: No deformities, no cyanosis or clubbing  Neuro: alert, non focal  Skin: Warm, no lesions or rashes      Assessment & Plan:  I personally reviewed all images and lab data in the [facility] system as well as any outside material available during this office visit and agree with the  radiology impressions.   Hypertension Hypertension under good control we will continue metoprolol  COPD with chronic bronchitis (HCC) No need for steroids or antibiotics continued inhaled medications  Hypothyroidism due to acquired atrophy of thyroid We will refill the Synthroid  Homelessness While the patient does not have air conditioning at least this patient has housing with a trailer  Major depressive disorder, recurrent episode, severe  ([place]) Ongoing depression and anxiety with elevated PHQ-9 we will refer this patient to psychiatry as he failed SSRIs in the past we will continue hydroxyzine as needed for anxiety  Polysubstance dependence including opioid type drug, episodic abuse, with delirium ([place]) History of polysubstance abuse with opiates and cocaine currently he is not using either  Tobacco use disorder    . Current smoking consumption amount: 1/2 pack a day  . Dicsussion on advise to quit smoking and smoking impacts: Lung health  . Patient's willingness to quit: Willing  . Methods to quit smoking discussed: Psychiatry referral behavioral modification  . Medication management of smoking session drugs discussed: Failed nicotine replacement  . Resources provided:  AVS   . Setting quit date not yet set  . Follow-up arranged 2 months   Time spent counseling the patient: 5 minutes    [name] was seen today for follow-up.  Diagnoses and all orders for this visit:  COPD with chronic bronchitis (HCC)  Shortness of breath -     Fluticasone-Salmeterol (ADVAIR DISKUS) 250-50 MCG/DOSE AEPB; Inhale 1 puff into the lungs 2 (two) times daily.  Insomnia, unspecified type -     traZODone (DESYREL) 150 MG tablet; Take 1 tablet (150 mg total) by mouth at bedtime as needed for sleep. -     mirtazapine (REMERON) 7.5 MG tablet; Take 1 tablet (7.5 mg total) by mouth at bedtime. For sleep. Must have office visit  for refills  Hypothyroidism due to acquired atrophy of thyroid -     levothyroxine (SYNTHROID) 175 MCG tablet; Take 1 tablet (175 mcg total) by mouth daily before breakfast. For thyroid hormone replacement  Anxiety -     hydrOXYzine (ATARAX/VISTARIL) 25 MG tablet; Take 1 tablet (25 mg total) by mouth 3 (three) times daily as needed for anxiety. -     Ambulatory referral to Psychiatry  Hypertension, unspecified type -     metoprolol succinate (TOPROL-XL) 25 MG 24 hr tablet; Take 1 tablet (25 mg total) by  mouth daily. For high blood pressure  Paresthesias -     gabapentin (NEURONTIN) 300 MG capsule; Take 1 capsule (300 mg total) by mouth 3 (three) times daily.  Cocaine abuse with cocaine-induced mood disorder ([place]) -     Ambulatory referral to Psychiatry  Homelessness  Severe episode of recurrent major depressive disorder, without psychotic features ([place])  Polysubstance dependence including opioid type drug, episodic abuse, with delirium ([place])  Tobacco use disorder  Other orders -     vitamin B-12 (CYANOCOBALAMIN) 1000 MCG tablet; Take 1 tablet (1,000 mcg total) by mouth daily. -     albuterol (VENTOLIN HFA) 108 (90 Base) MCG/ACT inhaler; Inhale 2 puffs into the lungs every 4 (four) hours as needed for wheezing or shortness of breath.

## 2020-04-07 NOTE — Assessment & Plan Note (Signed)
We will refill the Synthroid

## 2020-04-07 NOTE — Assessment & Plan Note (Signed)
History of polysubstance abuse with opiates and cocaine currently he is not using either

## 2020-04-07 NOTE — Patient Instructions (Signed)
Refills sent to our pharmacy  Referral sent to [facility] at [facility].  Arrive walkin at 750am mon thru Friday  No medication changes  Please obtain financial assistance paper work for [employer] card and cone financial assistance  Return Dr Joya Gaskins 4 months

## 2020-04-07 NOTE — Assessment & Plan Note (Signed)
While the patient does not have air conditioning at least this patient has housing with a trailer

## 2020-04-07 NOTE — Assessment & Plan Note (Signed)
Ongoing depression and anxiety with elevated PHQ-9 we will refer this patient to psychiatry as he failed SSRIs in the past we will continue hydroxyzine as needed for anxiety

## 2020-04-07 NOTE — Assessment & Plan Note (Signed)
No need for steroids or antibiotics continued inhaled medications

## 2020-04-07 NOTE — Assessment & Plan Note (Signed)
  .   Current smoking consumption amount: 1/2 pack a day  . Dicsussion on advise to quit smoking and smoking impacts: Lung health  . Patient's willingness to quit: Willing  . Methods to quit smoking discussed: Psychiatry referral behavioral modification  . Medication management of smoking session drugs discussed: Failed nicotine replacement  . Resources provided:  AVS   . Setting quit date not yet set  . Follow-up arranged 2 months   Time spent counseling the patient: 5 minutes

## 2020-04-07 NOTE — Assessment & Plan Note (Signed)
Hypertension under good control we will continue metoprolol

## 2020-04-22 MED FILL — LEVOTHYROXINE SODIUM 175 MC: 175 | 30 days supply | Qty: 30 | Fill #0

## 2020-04-22 MED FILL — MIRTAZAPINE 7.5 MG TABLET: 7.5 | 30 days supply | Qty: 30 | Fill #0

## 2020-04-22 MED FILL — ?TRAZODONE HCL 150MG TAB: 150 | 30 days supply | Qty: 30 | Fill #0

## 2020-04-22 MED FILL — GABAPENTIN 300 MG CAPSULE: 300 | 30 days supply | Qty: 90 | Fill #0

## 2020-07-12 ENCOUNTER — Other Ambulatory Visit: Payer: Self-pay | Admitting: Critical Care Medicine

## 2020-07-12 DIAGNOSIS — G4700 Insomnia, unspecified: Secondary | ICD-10-CM

## 2020-07-12 MED FILL — LEVOTHYROXINE SODIUM 175 MC: 175 | 30 days supply | Qty: 30 | Fill #1

## 2020-07-12 MED FILL — MIRTAZAPINE 7.5 MG TABLET: 7.5 | 30 days supply | Qty: 30 | Fill #0

## 2020-07-12 MED FILL — GABAPENTIN 300 MG CAPSULE: 300 | 30 days supply | Qty: 90 | Fill #1

## 2020-07-12 NOTE — Telephone Encounter (Signed)
Requested medication (s) are due for refill today:  Yes  Requested medication (s) are on the active medication list:  Yes  Future visit scheduled:  Yes  Last Refill: 04/22/20; #30; no refills  Notes to clinic: has appt. 07/29/20 (4 mo. F/u)  Last Rx written stated that pt. Must have OV for refills; please advise if he can have refill prior to appt.   Requested Prescriptions  Pending Prescriptions Disp Refills   mirtazapine (REMERON) 7.5 MG tablet [Pharmacy Med Name: MIRTAZAPINE 7.5 MG TABLET 7.5 Tablet] 30 tablet 0    Sig: Take 1 tablet (7.5 mg total) by mouth at bedtime. For sleep. Must have office visit for refills      Psychiatry: Antidepressants - mirtazapine Failed - 07/12/2020  3:54 PM      Failed - AST in normal range and within 360 days    AST  Date Value Ref Range Status  05/13/2019 59 (H) 15 - 41 U/L Final  03/18/2014 16 5 - 34 U/L Final          Failed - ALT in normal range and within 360 days    ALT  Date Value Ref Range Status  05/13/2019 61 (H) 0 - 44 U/L Final  03/18/2014 10 0 - 55 U/L Final          Failed - Triglycerides in normal range and within 360 days    Triglycerides  Date Value Ref Range Status  08/11/2018 122 <150 mg/dL Final          Failed - Total Cholesterol in normal range and within 360 days    Cholesterol  Date Value Ref Range Status  08/11/2018 213 (H) 0 - 200 mg/dL Final          Failed - WBC in normal range and within 360 days    WBC  Date Value Ref Range Status  05/13/2019 3.0 (L) 4.0 - 10.5 K/uL Final          Passed - Completed PHQ-2 or PHQ-9 in the last 360 days      Passed - Valid encounter within last 6 months    Recent Outpatient Visits           3 months ago COPD with chronic bronchitis ([place])   [place] [name], MD   6 months ago Hypothyroidism due to acquired atrophy of thyroid   [place], Patrick E, MD   1 year ago COPD with  chronic bronchitis [facility])   [place] Antony Blackbird, MD   1 year ago COPD with chronic bronchitis [facility])   [place] [name], MD   1 year ago [place], Dionne Bucy, [place]       Future Appointments             In 2 weeks Joya Gaskins Burnett Harry, MD [facility] [place]

## 2020-07-13 MED FILL — ?TRAZODONE HCL 150 MG TABL: 150 | 30 days supply | Qty: 30 | Fill #1

## 2020-07-21 MED FILL — ?TRAZODONE HCL 150 MG TABL: 150 | 30 days supply | Qty: 30 | Fill #1

## 2020-07-21 MED FILL — MIRTAZAPINE 7.5 MG TABLET: 7.5 | 30 days supply | Qty: 30 | Fill #0

## 2020-07-29 ENCOUNTER — Encounter: Payer: Self-pay | Admitting: Critical Care Medicine

## 2020-07-29 ENCOUNTER — Other Ambulatory Visit: Payer: Self-pay

## 2020-07-29 ENCOUNTER — Other Ambulatory Visit: Payer: Self-pay | Admitting: Critical Care Medicine

## 2020-07-29 ENCOUNTER — Ambulatory Visit: Payer: Self-pay | Attending: Critical Care Medicine | Admitting: Critical Care Medicine

## 2020-07-29 VITALS — BP 120/77 | HR 89 | Ht 71.0 in | Wt 161.6 lb

## 2020-07-29 DIAGNOSIS — F17200 Nicotine dependence, unspecified, uncomplicated: Secondary | ICD-10-CM

## 2020-07-29 DIAGNOSIS — G4700 Insomnia, unspecified: Secondary | ICD-10-CM

## 2020-07-29 DIAGNOSIS — Z23 Encounter for immunization: Secondary | ICD-10-CM

## 2020-07-29 DIAGNOSIS — Z8589 Personal history of malignant neoplasm of other organs and systems: Secondary | ICD-10-CM

## 2020-07-29 DIAGNOSIS — F332 Major depressive disorder, recurrent severe without psychotic features: Secondary | ICD-10-CM

## 2020-07-29 DIAGNOSIS — R1313 Dysphagia, pharyngeal phase: Secondary | ICD-10-CM

## 2020-07-29 DIAGNOSIS — J449 Chronic obstructive pulmonary disease, unspecified: Secondary | ICD-10-CM

## 2020-07-29 DIAGNOSIS — J441 Chronic obstructive pulmonary disease with (acute) exacerbation: Secondary | ICD-10-CM

## 2020-07-29 DIAGNOSIS — I10 Essential (primary) hypertension: Secondary | ICD-10-CM

## 2020-07-29 DIAGNOSIS — E034 Atrophy of thyroid (acquired): Secondary | ICD-10-CM

## 2020-07-29 DIAGNOSIS — R7303 Prediabetes: Secondary | ICD-10-CM

## 2020-07-29 DIAGNOSIS — R221 Localized swelling, mass and lump, neck: Secondary | ICD-10-CM

## 2020-07-29 DIAGNOSIS — R202 Paresthesia of skin: Secondary | ICD-10-CM

## 2020-07-29 LAB — POCT GLYCOSYLATED HEMOGLOBIN (HGB A1C): Hemoglobin A1C: 6 % — AB (ref 4.0–5.6)

## 2020-07-29 LAB — GLUCOSE, POCT (MANUAL RESULT ENTRY): POC Glucose: 125 mg/dl — AB (ref 70–99)

## 2020-07-29 MED ORDER — METHYLPREDNISOLONE ACETATE 40 MG/ML IJ SUSP
40.0000 mg | Freq: Once | INTRAMUSCULAR | Status: AC
  Administered 2020-07-29: 40 mg via INTRAMUSCULAR

## 2020-07-29 MED ORDER — LEVOTHYROXINE SODIUM 175 MCG PO TABS: 175.0000 ug | ORAL_TABLET | Freq: Every day | ORAL | 2 refills | Status: DC

## 2020-07-29 MED ORDER — METOPROLOL SUCCINATE ER 25 MG PO TB24: 25.0000 mg | ORAL_TABLET | Freq: Every day | ORAL | 2 refills | Status: DC

## 2020-07-29 MED ORDER — VITAMIN B-12 1000 MCG PO TABS: 1000.0000 ug | ORAL_TABLET | Freq: Every day | ORAL | 3 refills | Status: DC

## 2020-07-29 MED ORDER — ALBUTEROL SULFATE HFA 108 (90 BASE) MCG/ACT IN AERS: 2.0000 | INHALATION_SPRAY | RESPIRATORY_TRACT | 1 refills | Status: DC | PRN

## 2020-07-29 MED ORDER — GABAPENTIN 300 MG PO CAPS: 300.0000 mg | ORAL_CAPSULE | Freq: Three times a day (TID) | ORAL | 3 refills | Status: DC

## 2020-07-29 MED ORDER — NICOTINE POLACRILEX 4 MG MT LOZG: LOZENGE | OROMUCOSAL | 4 refills | Status: DC

## 2020-07-29 MED ORDER — MIRTAZAPINE 7.5 MG PO TABS: 7.5000 mg | ORAL_TABLET | Freq: Every day | ORAL | 1 refills | Status: DC

## 2020-07-29 MED ORDER — DOXYCYCLINE HYCLATE 100 MG PO TABS: 100.0000 mg | ORAL_TABLET | Freq: Two times a day (BID) | ORAL | 0 refills | Status: DC

## 2020-07-29 MED ORDER — ADVAIR HFA 230-21 MCG/ACT IN AERO: 2.0000 | INHALATION_SPRAY | Freq: Two times a day (BID) | RESPIRATORY_TRACT | 12 refills | Status: DC

## 2020-07-29 MED ORDER — METHYLPREDNISOLONE ACETATE 80 MG/ML IJ SUSP: 80.0000 mg | Freq: Once | INTRAMUSCULAR | Status: DC

## 2020-07-29 MED FILL — ALBUTEROL SULFATE HFA 108 (: 108 (90 BAS | 16 days supply | Qty: 18 | Fill #0

## 2020-07-29 MED FILL — DOXYCYCLINE HYCLATE 100 MG: 100 | 7 days supply | Qty: 14 | Fill #0

## 2020-07-29 MED FILL — !ADVAIR HFA 230-21 MCG INHA: 230-21 | 30 days supply | Qty: 12 | Fill #0

## 2020-07-29 MED FILL — SM NICOTINE 4 MG LOZENGE: 4 | 24 days supply | Qty: 72 | Fill #0

## 2020-07-29 MED FILL — METOPROLOL SUCCINATE ER 25: 25 | 30 days supply | Qty: 30 | Fill #0

## 2020-07-29 NOTE — Assessment & Plan Note (Signed)
History of recurrent depression in the past patient does score elevated on PHQ-9 at this visit  Plan follow-up with licensed clinical social worker

## 2020-07-29 NOTE — Assessment & Plan Note (Signed)
Firm nonmovable left neck mass with history of vocal cord cancer  Referral to ENT  CT of the chest will be ordered

## 2020-07-29 NOTE — Assessment & Plan Note (Signed)
Acquired hypothyroidism due to radiotherapy to the neck in the past  Plan is to continue Synthroid replacement

## 2020-07-29 NOTE — Assessment & Plan Note (Signed)
COPD with primary eczema acute bronchitis acute exacerbation  Plan is to give a Depo-Medrol injection 40 mg IM and 7-day course of doxycycline 100 mg twice daily  We will change Advair to HFA inhaler 230 mcg strength 2 puffs twice daily

## 2020-07-29 NOTE — Patient Instructions (Signed)
A flu vaccine was given A steroid injection was given for your breathing  Begin doxycycline 1 twice daily for 7 days for bronchitis  We are changing Advair to the Advair inhaler take 2 inhalations twice daily  A CT scan of the chest is going to be obtained to evaluate for cancer recurrence with the throat issue you are experiencing in your history of vocal cord cancer  A appointment with Dr. Lazarus Salines are ear nose and throat doctor will be made for evaluation of your throat  Focus on smoking cessation use the nicotine lozenges 4 mg 3 times daily  We will get you your Moderna booster vaccine and will come to your home with a homebound nurse program this will occur the end of January  Labs today include a metabolic panel  All refills on your medications sent to her pharmacy  Return to see Dr. Delford Field 2 months

## 2020-07-29 NOTE — Progress Notes (Signed)
Subjective:    Patient ID: [name], male    DOB: 05-08-1966, 55 y.o.   MRN: [id number]  06/24/19 55 y.o.M here for f/u COPD Homelessnes This patient [place] and was seen previously in the clinic earlier in November by our physician assistant.  Documentation from that visit is as below.   [name] is a 55 y.o. male here to establish care and as hospital follow up.  He went to ED for med RF.  Homeless.  Out of thyroid meds ~ 1 month before restarting thyroid meds on 05/13/2019.  Denies drinking much alcohol.  C/o paresthesias in fingers and toes not controlled on gabapentin 200mg  tid.  Also severe fatigue.  Poor breathing.  Still smoking but trying to cut back.  H/o thyroid CA s/p radiation.     GABA did not help   1. Paresthesias - Thyroid Panel With TSH - gabapentin (NEURONTIN) 300 MG capsule; Take 1 capsule (300 mg total) by mouth 3 (three) times daily.  Dispense: 90 capsule; Refill: 3 - Vitamin B12 - Folate  2. Hypothyroidism due to acquired atrophy of thyroid Will not adjust based on this reading bc he just resumed meds 10/20 - Thyroid Panel With TSH - levothyroxine (SYNTHROID) 175 MCG tablet; Take 1 tablet (175 mcg total) by mouth daily before breakfast. For thyroid hormone replacement  Dispense: 30 tablet; Refill: 2  3. Fatigue, unspecified type  4. Insomnia, unspecified type  - mirtazapine (REMERON) 7.5 MG tablet; Take 1 tablet (7.5 mg total) by mouth at bedtime. For sleep  Dispense: 30 tablet; Refill: 0 - traZODone (DESYREL) 150 MG tablet; Take 1 tablet (150 mg total) by mouth at bedtime as needed for sleep.  Dispense: 30 tablet; Refill: 3  5. Anxiety - hydrOXYzine (ATARAX/VISTARIL) 25 MG tablet; Take 1 tablet (25 mg total) by mouth 3 (three) times daily as needed for anxiety.  Dispense: 60 tablet; Refill: 0  6. Hypertension, unspecified type Not controlled-resume meds - metoprolol succinate (TOPROL-XL) 25 MG 24 hr  tablet; Take 1 tablet (25 mg total) by mouth daily. For high blood pressure  Dispense: 30 tablet; Refill: 2  7. Homeless I am - Ambulatory referral to Social Work     8. Shortness of breath -unclear if he has emphysema or COPD?;  Will add- - Fluticasone-Salmeterol (ADVAIR DISKUS) 250-50 MCG/DOSE AEPB; Inhale 1 puff into the lungs 2 (two) times daily.  Dispense: 1 each; Refill: 3 01/06/20 The patient states the insomnia has been improved the use of Remeron and trazodone.  He states hydroxyzine also has helped the anxiety levels.  He also is taking the metoprolol 25 mg daily and this has been helpful as well.  He is using the Advair as well at 1 puff twice a day and this is helped his breathing.  He has not had a recent chest x-ray.  The patient also resume the levothyroxine and note lab results in the last visit showed still hypothyroidism and part of this was he was out of medication.  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  He has not been seen in follow-up in some time.  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  He also does not  drink alcohol.  He is still smoking however a pack a day of cigarettes  The patient does have very mild dysphagia at this time Note B12 levels did come back low and these were checked to evaluate for paresthesias therefore we will need to r begin B12 supplementation  01/05/2020 Since the last visit in December 2020 the patient continues to smoke a half a pack a day. He is maintaining his blood pressure medications. He has had no further flareups of his lung disease but has run out of his inhalers. The patient's not been emergency room for his breathing since the last visit. He is living in a tent encampment time friend's home in  [place]. He still has some swallowing issues and is edentulous. He states his dentures did not fit him very well. He is able to maintain some nutrition. Note his weight is down to 141 with a BMI of 19.6  He was not able to achieve an ENT appointment to follow-up on prior history of vocal cord cancer status post radiation to the neck. The cancer center stated he would need to see an ENT for evaluation. The patient is requesting 90-day refills on medications. He has been compliant with his Synthroid. He needs follow-up labs on the Synthroid. He is yet to receive his Covid vaccine.  04/07/2020 Patient returns in follow-up for his COPD and hypertension.  He now has a trailer to live down.  He complains of increased shortness of breath and cough.  He is smoking still but less.  He did receive his [place]na vaccine through a homebound vaccine program  07/29/2020 Patient seen return follow-up and notes increased shortness of breath increased cough thick black mucus also notes increased swelling in the left neck increased hoarseness and inability to swallow as effectively.  Prior history of vocal cord cancer status post radiation therapy.  He is also on a dry powdered inhaler Advair which is creating difficulty with his cough and his upper airway.  Note this patient still is homeless living in a tent environment.  He is looking for a Materna booster and he was already in the homebound vaccine program  Shortness of Breath This is a chronic problem. The current episode started more than 1 year ago. The problem occurs daily. The problem has been gradually worsening. Associated symptoms include chest pain, ear pain, rhinorrhea, a sore throat, sputum production and wheezing. Pertinent negatives include no fever, headaches, hemoptysis, orthopnea, PND or vomiting. The symptoms are aggravated by smoke, odors, exercise and any activity. Associated symptoms comments: Mucus is clear  Voice gone Sore throat. Risk  factors include smoking. He has tried beta agonist inhalers and steroid inhalers for the symptoms. The treatment provided mild relief. His past medical history is significant for COPD. There is no history of asthma, bronchiolitis, CAD, DVT, PE or pneumonia.   Past Medical History:  Diagnosis Date  . COPD (chronic obstructive pulmonary disease) (HCC)   . Headache(784.0)   . Heart murmur    child  . Hx of radiation therapy 05/19/13- 06/27/13   glottic larynx, right true vocal cord 6300 cGy 28 sessions  . Hypothyroidism   . Influenza B 08/01/2018  . Long term (current) use of opiate analgesic 10/11/2016  . Polysubstance abuse (HCC)   . Polysubstance dependence including opioid type drug, episodic abuse, with delirium (HCC) 03/22/2017  . Shortness of breath   . 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 Vocal cord cancer (HCC) 05/01/2013  Family History  Problem Relation Age of Onset  . COPD Mother   . Emphysema Mother   . Heart attack Father      Social History   Socioeconomic History  . Marital status: Legally Separated    Spouse name: Not on file  . Number of children: 3  . Years of education: Not on file  . 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 Smoking status: Current Every Day Smoker    Packs/day: 1.00    Years: 34.00    Pack years: 34.00    Types: Cigarettes  . Smokeless tobacco: Never Used  . Tobacco comment: DOWN TO 4 - 5 CIGARETTES A DAY  Vaping Use  . Vaping Use: Never used  Substance and Sexual Activity  . Alcohol use: No    Alcohol/week: 0.0 standard drinks  . Drug use: Not Currently    Types: Marijuana, Cocaine, Heroin    Comment: He hasn't used drugs in "over a year"- 06/27/19  . Sexual activity: Yes    Birth control/protection: Condom  Other Topics Concern  . Not on file  Social History Narrative   The patient is married. Patient has 3 children.  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   The patient has a history of smoking 2 packs of cigarette a day for 34 years. Patient current is trying to quit smoking. Patient denies use of alcohol. Patient denies use of other illicit drugs.   Social Determinants of Health   Financial Resource Strain: Not on file  Food Insecurity: Not on file  Transportation Needs: Not on file  Physical Activity: Not on file  Stress: Not on file  Social Connections: Not on file  Intimate Partner Violence: Not on file     Allergies  Allergen Reactions  . Fentanyl Nausea And Vomiting  . Citalopram Other (See Comments)    Dizziness and shakiness.  . Morphine And Related Nausea And Vomiting     Outpatient Medications Prior to Visit  Medication Sig Dispense Refill  . albuterol (VENTOLIN HFA) 108 (90 Base) MCG/ACT inhaler Inhale 2 puffs into the lungs every 4 (four) hours as needed for wheezing or shortness of breath. 18 g 1  . Fluticasone-Salmeterol (ADVAIR DISKUS) 250-50 MCG/DOSE AEPB Inhale 1 puff into the lungs 2 (two) times daily. 1 each 3  . gabapentin (NEURONTIN) 300 MG capsule Take 1 capsule (300 mg total) by mouth 3 (three) times daily. 180 capsule 3  . levothyroxine (SYNTHROID) 175 MCG tablet Take 1 tablet (175 mcg total) by mouth daily before breakfast. For thyroid hormone replacement 90 tablet 2  . metoprolol succinate (TOPROL-XL) 25 MG 24 hr tablet Take 1 tablet (25 mg total) by mouth daily. For high blood pressure 90 tablet 2  . mirtazapine (REMERON) 7.5 MG tablet TAKE 1 TABLET (7.5 MG TOTAL) BY MOUTH AT BEDTIME. FOR SLEEP. MUST HAVE OFFICE VISIT FOR REFILLS 30 tablet 0  . traZODone (DESYREL) 150 MG tablet Take 1 tablet (150 mg total) by mouth at bedtime as needed for sleep. 90 tablet 3  . vitamin B-12 (CYANOCOBALAMIN) 1000 MCG tablet Take 1 tablet (1,000 mcg total) by mouth daily. 90 tablet 3  . hydrOXYzine (ATARAX/VISTARIL) 25 MG tablet Take 1 tablet (25 mg total) by mouth 3 (three) times  daily as needed for anxiety. (Patient not taking: Reported on 07/29/2020) 60 tablet 0   No  facility-administered medications prior to visit.     Review of Systems  Constitutional: Negative for fever.  HENT: Positive for ear pain, rhinorrhea, sore throat, trouble swallowing and voice change.   Respiratory: Positive for sputum production, choking, chest tightness, shortness of breath and wheezing. Negative for hemoptysis.   Cardiovascular: Positive for chest pain. Negative for orthopnea and PND.  Gastrointestinal: Negative for vomiting.       Gerd  Endocrine: Positive for polyuria.  Genitourinary: Negative.   Neurological: Negative for headaches.  Psychiatric/Behavioral: Positive for dysphoric mood. Negative for self-injury and suicidal ideas.       Objective:   Physical Exam  BP 120/77 (BP Location: Left Arm, Patient Position: Sitting, Cuff Size: Normal)   Pulse 89   Ht 5\' 11"  (1.803 m)   Wt 161 lb 9.6 oz (73.3 kg)   SpO2 96%   BMI 22.54 kg/m   Gen: Pleasant, thin, in no distress,  normal affect  ENT: No lesions,  mouth clear,  oropharynx clear, no postnasal drip, edentulous  Neck: No JVD, no TMG, no carotid bruits, there is a left-sided firm neck mass at the end of the thyroid cartilage  Lungs: No use of accessory muscles, no dullness to percussion, distant breath sounds, few expired wheezes no rhonchi hyperresonance to percussion Cardiovascular: RRR, heart sounds normal, no murmur or gallops, no peripheral edema  Abdomen: soft and NT, no HSM,  BS normal  Musculoskeletal: No deformities, no cyanosis or clubbing  Neuro: alert, non focal  Skin: Warm, no lesions or rashes  Lab Results  Component Value Date   HGBA1C 6.0 (A) 07/29/2020        Assessment & Plan:  I personally reviewed all images and lab data in the [facility] system as well as any outside material available during this office visit and agree with the  radiology impressions.   COPD with chronic bronchitis  ([place]) COPD with primary eczema acute bronchitis acute exacerbation  Plan is to give a Depo-Medrol injection 40 mg IM and 7-day course of doxycycline 100 mg twice daily  We will change Advair to HFA inhaler 230 mcg strength 2 puffs twice daily  Hypothyroidism due to acquired atrophy of thyroid Acquired hypothyroidism due to radiotherapy to the neck in the past  Plan is to continue Synthroid replacement  Major depressive disorder, recurrent episode, severe ([place]) History of recurrent depression in the past patient does score elevated on PHQ-9 at this visit  Plan follow-up with licensed clinical social worker  Tobacco use disorder    . Current smoking consumption amount: 1/2 pack a day  . Dicsussion on advise to quit smoking and smoking impacts: Lung health  . Patient's willingness to quit: Willing  . Methods to quit smoking discussed: Psychiatry referral behavioral modification and nicotine replacement  . Medication management of smoking session drugs discussed: Failed nicotine replacement topical patches will attempt to use nicotine lozenges 4 mg 3 times daily  . Resources provided:  AVS   . Setting quit date not yet set  . Follow-up arranged 2 months   Time spent counseling the patient: 5 minutes   Hypertension Hypertension stable at this time continue current medications  Neck mass Firm nonmovable left neck mass with history of vocal cord cancer  Referral to ENT  CT of the chest will be ordered   Nyair was seen today for follow-up.  Diagnoses and all orders for this visit:  COPD exacerbation ([place]) -     Discontinue: methylPREDNISolone acetate (DEPO-MEDROL) injection 80  mg -     methylPREDNISolone acetate (DEPO-MEDROL) injection 40 mg  Prediabetes -     Glucose (CBG) -     HgB A1c -     Comprehensive metabolic panel  COPD with chronic bronchitis (HCC) -     Discontinue: methylPREDNISolone acetate (DEPO-MEDROL) injection 80 mg -     methylPREDNISolone  acetate (DEPO-MEDROL) injection 40 mg -     methylPREDNISolone acetate (DEPO-MEDROL) injection 40 mg  Insomnia, unspecified type -     mirtazapine (REMERON) 7.5 MG tablet; Take 1 tablet (7.5 mg total) by mouth at bedtime.  Paresthesias -     gabapentin (NEURONTIN) 300 MG capsule; Take 1 capsule (300 mg total) by mouth 3 (three) times daily.  Hypertension, unspecified type -     Comprehensive metabolic panel -     metoprolol succinate (TOPROL-XL) 25 MG 24 hr tablet; Take 1 tablet (25 mg total) by mouth daily. For high blood pressure  Hypothyroidism due to acquired atrophy of thyroid -     levothyroxine (SYNTHROID) 175 MCG tablet; Take 1 tablet (175 mcg total) by mouth daily before breakfast. For thyroid hormone replacement  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 -     CT CHEST W CONTRAST; Future  Neck mass -     CT CHEST W CONTRAST; Future  Pharyngeal dysphagia -     CT CHEST W CONTRAST; Future  Need for immunization against influenza -     Flu Vaccine QUAD 36+ mos IM  Severe episode of recurrent major depressive disorder, without psychotic features ([place])  Tobacco use disorder  Other orders -     nicotine polacrilex (NICORETTE MINI) 4 MG lozenge; Use one lozenge three times daily to quit smoking -     fluticasone-salmeterol (ADVAIR HFA) 230-21 MCG/ACT inhaler; Inhale 2 puffs into the lungs 2 (two) times daily. -     albuterol (VENTOLIN HFA) 108 (90 Base) MCG/ACT inhaler; Inhale 2 puffs into the lungs every 4 (four) hours as needed for wheezing or shortness of breath. -     vitamin B-12 (CYANOCOBALAMIN) 1000 MCG tablet; Take 1 tablet (1,000 mcg total) by mouth daily. -     doxycycline (VIBRA-TABS) 100 MG tablet; Take 1 tablet (100 mg total) by mouth 2 (two) times daily.

## 2020-07-29 NOTE — Assessment & Plan Note (Signed)
Hypertension stable at this time continue current medications

## 2020-07-29 NOTE — Assessment & Plan Note (Signed)
  .   Current smoking consumption amount: 1/2 pack a day ? ?? Dicsussion on advise to quit smoking and smoking impacts: Lung health ? ?? Patient's willingness to quit: Willing ? ?? Methods to quit smoking discussed: Psychiatry referral behavioral modification and nicotine replacement ? ?? Medication management of smoking session drugs discussed: Failed nicotine replacement topical patches will attempt to use nicotine lozenges 4 mg 3 times daily ? ?? Resources provided:  AVS  ? ?? Setting quit date not yet set ? ?? Follow-up arranged 2 months ? ? ?Time spent counseling the patient: 5 minutes ? ?

## 2020-07-30 ENCOUNTER — Telehealth (INDEPENDENT_AMBULATORY_CARE_PROVIDER_SITE_OTHER): Payer: Self-pay

## 2020-07-30 LAB — COMPREHENSIVE METABOLIC PANEL
ALT: 9 IU/L (ref 0–44)
AST: 16 IU/L (ref 0–40)
Albumin/Globulin Ratio: 2 (ref 1.2–2.2)
Albumin: 4.3 g/dL (ref 3.8–4.9)
Alkaline Phosphatase: 60 IU/L (ref 44–121)
BUN/Creatinine Ratio: 16 (ref 9–20)
BUN: 20 mg/dL (ref 6–24)
Bilirubin Total: 0.3 mg/dL (ref 0.0–1.2)
CO2: 25 mmol/L (ref 20–29)
Calcium: 9.2 mg/dL (ref 8.7–10.2)
Chloride: 101 mmol/L (ref 96–106)
Creatinine, Ser: 1.24 mg/dL (ref 0.76–1.27)
GFR calc Af Amer: 75 mL/min/{1.73_m2} (ref >59)
GFR calc non Af Amer: 65 mL/min/{1.73_m2} (ref >59)
Globulin, Total: 2.1 g/dL (ref 1.5–4.5)
Glucose: 100 mg/dL — ABNORMAL HIGH (ref 65–99)
Potassium: 4.3 mmol/L (ref 3.5–5.2)
Sodium: 141 mmol/L (ref 134–144)
Total Protein: 6.4 g/dL (ref 6.0–8.5)

## 2020-07-30 NOTE — Telephone Encounter (Signed)
-----   Message from [name], MD sent at 07/30/2020  1:26 PM EST ----- Let pt know liver and kidney normal

## 2020-07-30 NOTE — Telephone Encounter (Signed)
Patient verified date of birth. He is aware that liver and kidney are normal. Nat Christen, CMA

## 2020-08-13 ENCOUNTER — Ambulatory Visit: Payer: Self-pay | Admitting: Otolaryngology

## 2020-08-20 ENCOUNTER — Encounter (HOSPITAL_COMMUNITY): Payer: Self-pay

## 2020-08-20 ENCOUNTER — Ambulatory Visit (HOSPITAL_COMMUNITY)
Admission: RE | Admit: 2020-08-20 | Discharge: 2020-08-20 | Disposition: A | Discharge status: Home / Self Care | Payer: Self-pay | Source: Ambulatory Visit | Attending: Critical Care Medicine | Admitting: Critical Care Medicine

## 2020-08-20 ENCOUNTER — Other Ambulatory Visit: Payer: Self-pay

## 2020-08-20 DIAGNOSIS — Z8589 Personal history of malignant neoplasm of other organs and systems: Secondary | ICD-10-CM | POA: Insufficient documentation

## 2020-08-20 DIAGNOSIS — R1313 Dysphagia, pharyngeal phase: Secondary | ICD-10-CM | POA: Insufficient documentation

## 2020-08-20 DIAGNOSIS — R221 Localized swelling, mass and lump, neck: Secondary | ICD-10-CM | POA: Insufficient documentation

## 2020-08-20 HISTORY — DX: Essential (primary) hypertension: I10

## 2020-08-20 MED ORDER — IOHEXOL 300 MG/ML  SOLN
75.0000 mL | Freq: Once | INTRAMUSCULAR | Status: AC | PRN
  Administered 2020-08-20: 75 mL via INTRAVENOUS

## 2020-08-27 ENCOUNTER — Other Ambulatory Visit: Payer: Self-pay

## 2020-08-27 ENCOUNTER — Ambulatory Visit: Payer: Self-pay | Attending: Otolaryngology | Admitting: Otolaryngology

## 2020-08-27 ENCOUNTER — Encounter: Payer: Self-pay | Admitting: Otolaryngology

## 2020-08-27 VITALS — BP 116/83 | HR 81 | Ht 71.0 in | Wt 154.6 lb

## 2020-08-27 DIAGNOSIS — C441122 Basal cell carcinoma of skin of right lower eyelid, including canthus: Secondary | ICD-10-CM

## 2020-08-27 DIAGNOSIS — F17200 Nicotine dependence, unspecified, uncomplicated: Secondary | ICD-10-CM

## 2020-08-27 DIAGNOSIS — R1319 Other dysphagia: Secondary | ICD-10-CM

## 2020-08-27 DIAGNOSIS — R490 Dysphonia: Secondary | ICD-10-CM

## 2020-08-27 DIAGNOSIS — Z8521 Personal history of malignant neoplasm of larynx: Secondary | ICD-10-CM | POA: Insufficient documentation

## 2020-08-27 DIAGNOSIS — L821 Other seborrheic keratosis: Secondary | ICD-10-CM

## 2020-08-27 DIAGNOSIS — K219 Gastro-esophageal reflux disease without esophagitis: Secondary | ICD-10-CM

## 2020-08-27 NOTE — Progress Notes (Signed)
Chief complaint: Progressive swallowing difficulty  History: 55 year old white male with history of a T1 vocal cord carcinoma under the care of Drs. Wilburn Cornelia and [place].  He has had significant issues with dysphagia over the years.  In times past, he was profoundly hypothyroid, but now is reliable with his Synthroid at not having additional trouble.  He continues to smoke 1 pack/day.  He does not drink alcohol.  No neck masses.  He has known COPD.  CT scan of the chest in December 2021 with no evidence of cancer  Examination: He is slightly cachectic.  Mental status is sharp.  He is substantially hoarse but without stridor.  He is not coughing or clearing his throat unusually.  The head is atraumatic.  Ear canals are clear with normal drums.  Anterior nose is moist and patent with some mucositis consistent with smoking.  Oral cavity is edentulous but reasonably moist.  Oropharynx is clear.  Hypopharynx/larynx by mirror examination incomplete but without obvious endolaryngeal lesions.  Neck reveals slight fibrosis and thickening of the lower right sternocleidomastoid muscle.  No adenopathy.  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  Chronic dysphagia.  Continued cigarette abuse.  COPD.  Plan: He needs to stop smoking.  I will check a barium swallow and call him with those results.  I would have him see Dr. Wilburn Cornelia once again including probable flexible laryngoscopy.

## 2020-08-27 NOTE — Patient Instructions (Signed)
I do not see any obvious signs of cancer today.  This is, of course, good news.  I am concerned because you continue to smoke.  You are having some progressive difficulty with your voice and with your swallowing.  I would like you to get a swallowing x-ray.  We will call you with these results.  I would have you see Dr. Wilburn Cornelia at [facility] ear, nose,  and throat again after many years he will probably do a flexible laryngoscopy (telescope).  Continue to take your daily thyroid supplements.

## 2020-08-27 NOTE — Progress Notes (Addendum)
Having trouble swallowing.   During our interview, he notes a lesion on his left parietal scalp which has been present for several years and which he tends to pick at it.  I noticed a lesion medial right lower eyelid which he claims has been present for several years and slowly larger.  The left scalp lesion probably an excoriated seborrheic keratosis.  The right lower eyelid lesion has a rolled border with a central umbilication roughly 8 x 11 mm.  I suspect this is basal cell carcinoma.  I would like Dr. Wilburn Cornelia or the otolaryngologist of his choice take care of both these lesions

## 2020-09-01 ENCOUNTER — Ambulatory Visit: Payer: Self-pay | Attending: Internal Medicine

## 2020-09-01 ENCOUNTER — Other Ambulatory Visit: Payer: Self-pay

## 2020-09-01 DIAGNOSIS — Z23 Encounter for immunization: Secondary | ICD-10-CM

## 2020-09-01 NOTE — Progress Notes (Signed)
   Covid-19 Vaccination Clinic  Name:  [name]    MRN: [id number] DOB: July 13, 1966  09/01/2020  [name] was observed post Covid-19 immunization for 15 minutes without incident. He was provided with Vaccine Information Sheet and instruction to access the V-Safe system.   [name] was instructed to call 911 with any severe reactions post vaccine: Marland Kitchen Difficulty breathing  . Swelling of face and throat  . A fast heartbeat  . A bad rash all over body  . Dizziness and weakness   Immunizations Administered    Name Date Dose VIS Date Route   Moderna Covid-19 Booster Vaccine 09/01/2020 10:14 AM 0.25 mL 05/12/2020 Intramuscular   Manufacturer: Moderna   Lot: [id number]   [place]: 01601-093-23

## 2020-09-06 ENCOUNTER — Ambulatory Visit
Admission: RE | Admit: 2020-09-06 | Discharge: 2020-09-06 | Disposition: A | Discharge status: Home / Self Care | Payer: Self-pay | Source: Ambulatory Visit | Attending: Otolaryngology | Admitting: Otolaryngology

## 2020-09-06 DIAGNOSIS — F17200 Nicotine dependence, unspecified, uncomplicated: Secondary | ICD-10-CM

## 2020-09-06 DIAGNOSIS — K219 Gastro-esophageal reflux disease without esophagitis: Secondary | ICD-10-CM

## 2020-09-06 DIAGNOSIS — R1319 Other dysphagia: Secondary | ICD-10-CM

## 2020-09-06 DIAGNOSIS — R490 Dysphonia: Secondary | ICD-10-CM

## 2020-09-16 ENCOUNTER — Telehealth: Payer: Self-pay | Admitting: Critical Care Medicine

## 2020-09-16 DIAGNOSIS — R1319 Other dysphagia: Secondary | ICD-10-CM

## 2020-09-16 DIAGNOSIS — K222 Esophageal obstruction: Secondary | ICD-10-CM

## 2020-09-16 NOTE — Telephone Encounter (Signed)
Copied from [place] [id number]. Topic: General - Other >> Sep 15, 2020 10:18 AM Leward Quan A wrote: Reason for CRM: Patient called in to inquire of Dr Joya Gaskins about the test that he had on 09/06/20 asking for a call back  at Ph# [phone] >> Sep 15, 2020 12:28 PM Ula Lingo Arelia Sneddon wrote: Pt states he had esophagus test and not heard back.  Pt states getting harder to swallow

## 2020-09-16 NOTE — Telephone Encounter (Signed)
Dr Erik Obey ordered,  It showed esoph stricture,  I sent a referral to GI to eval  Pt said dr Wilburn Cornelia may call him for further eval of his throat.  Ileene Hutchinson were you arranging this?

## 2020-09-17 ENCOUNTER — Telehealth: Payer: Self-pay | Admitting: Otolaryngology

## 2020-09-17 NOTE — Telephone Encounter (Signed)
Dr. Joya Gaskins, we did not give the patient instructions to go back to Dr. Wilburn Cornelia except in the after visit summary.

## 2020-09-17 NOTE — Telephone Encounter (Signed)
Dr.Wolicki states that he will reach out to Dr.Shoemaker.

## 2020-09-22 ENCOUNTER — Telehealth: Payer: Self-pay | Admitting: Critical Care Medicine

## 2020-09-22 NOTE — Telephone Encounter (Signed)
[facility]  told him that he need to apply for Financial  I called him  lvm

## 2020-09-22 NOTE — Telephone Encounter (Signed)
Copied from [place] [id number]. Topic: General - Inquiry >> Sep 21, 2020 11:24 AM Greggory Keen D wrote: Reason for CRM: t called saying he ws referred to [place] from Dr. Joya Gaskins but they wanted 125.00 up front to see him.  He states he does not have a job and no insurance.  He can not afford to go to this appt.

## 2020-09-22 NOTE — Telephone Encounter (Signed)
Thank you :)

## 2020-09-26 NOTE — Progress Notes (Deleted)
Subjective:    Patient ID: [name], male    DOB: 05-08-1966, 55 y.o.   MRN: [id number]  06/24/19 55 y.o.M here for f/u COPD Homelessnes This patient [place] and was seen previously in the clinic earlier in November by our physician assistant.  Documentation from that visit is as below.   [name] is a 55 y.o. male here to establish care and as hospital follow up.  He went to ED for med RF.  Homeless.  Out of thyroid meds ~ 1 month before restarting thyroid meds on 05/13/2019.  Denies drinking much alcohol.  C/o paresthesias in fingers and toes not controlled on gabapentin 200mg  tid.  Also severe fatigue.  Poor breathing.  Still smoking but trying to cut back.  H/o thyroid CA s/p radiation.     GABA did not help   1. Paresthesias - Thyroid Panel With TSH - gabapentin (NEURONTIN) 300 MG capsule; Take 1 capsule (300 mg total) by mouth 3 (three) times daily.  Dispense: 90 capsule; Refill: 3 - Vitamin B12 - Folate  2. Hypothyroidism due to acquired atrophy of thyroid Will not adjust based on this reading bc he just resumed meds 10/20 - Thyroid Panel With TSH - levothyroxine (SYNTHROID) 175 MCG tablet; Take 1 tablet (175 mcg total) by mouth daily before breakfast. For thyroid hormone replacement  Dispense: 30 tablet; Refill: 2  3. Fatigue, unspecified type  4. Insomnia, unspecified type  - mirtazapine (REMERON) 7.5 MG tablet; Take 1 tablet (7.5 mg total) by mouth at bedtime. For sleep  Dispense: 30 tablet; Refill: 0 - traZODone (DESYREL) 150 MG tablet; Take 1 tablet (150 mg total) by mouth at bedtime as needed for sleep.  Dispense: 30 tablet; Refill: 3  5. Anxiety - hydrOXYzine (ATARAX/VISTARIL) 25 MG tablet; Take 1 tablet (25 mg total) by mouth 3 (three) times daily as needed for anxiety.  Dispense: 60 tablet; Refill: 0  6. Hypertension, unspecified type Not controlled-resume meds - metoprolol succinate (TOPROL-XL) 25 MG 24 hr  tablet; Take 1 tablet (25 mg total) by mouth daily. For high blood pressure  Dispense: 30 tablet; Refill: 2  7. Homeless I am - Ambulatory referral to Social Work     8. Shortness of breath -unclear if he has emphysema or COPD?;  Will add- - Fluticasone-Salmeterol (ADVAIR DISKUS) 250-50 MCG/DOSE AEPB; Inhale 1 puff into the lungs 2 (two) times daily.  Dispense: 1 each; Refill: 3 01/06/20 The patient states the insomnia has been improved the use of Remeron and trazodone.  He states hydroxyzine also has helped the anxiety levels.  He also is taking the metoprolol 25 mg daily and this has been helpful as well.  He is using the Advair as well at 1 puff twice a day and this is helped his breathing.  He has not had a recent chest x-ray.  The patient also resume the levothyroxine and note lab results in the last visit showed still hypothyroidism and part of this was he was out of medication.  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  He has not been seen in follow-up in some time.  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  He also does not  drink alcohol.  He is still smoking however a pack a day of cigarettes  The patient does have very mild dysphagia at this time Note B12 levels did come back low and these were checked to evaluate for paresthesias therefore we will need to r begin B12 supplementation  01/05/2020 Since the last visit in December 2020 the patient continues to smoke a half a pack a day. He is maintaining his blood pressure medications. He has had no further flareups of his lung disease but has run out of his inhalers. The patient's not been emergency room for his breathing since the last visit. He is living in a tent encampment time friend's home in  [place]. He still has some swallowing issues and is edentulous. He states his dentures did not fit him very well. He is able to maintain some nutrition. Note his weight is down to 141 with a BMI of 19.6  He was not able to achieve an ENT appointment to follow-up on prior history of vocal cord cancer status post radiation to the neck. The cancer center stated he would need to see an ENT for evaluation. The patient is requesting 90-day refills on medications. He has been compliant with his Synthroid. He needs follow-up labs on the Synthroid. He is yet to receive his Covid vaccine.  04/07/2020 Patient returns in follow-up for his COPD and hypertension.  He now has a trailer to live down.  He complains of increased shortness of breath and cough.  He is smoking still but less.  He did receive his [place]na vaccine through a homebound vaccine program  07/29/2020 Patient seen return follow-up and notes increased shortness of breath increased cough thick black mucus also notes increased swelling in the left neck increased hoarseness and inability to swallow as effectively.  Prior history of vocal cord cancer status post radiation therapy.  He is also on a dry powdered inhaler Advair which is creating difficulty with his cough and his upper airway.  Note this patient still is homeless living in a tent environment.  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) History of recurrent depression in the past patient does score elevated on PHQ-9 at this visit  Plan follow-up with licensed clinical social worker  Tobacco use disorder    Current smoking consumption amount: 1/2 pack a day  Dicsussion on advise to quit smoking and smoking impacts: Lung health  Patient's willingness to quit: Willing  Methods to quit smoking discussed: Psychiatry referral behavioral modification and nicotine replacement  Medication management of smoking session drugs discussed: Failed nicotine replacement topical patches will attempt to use nicotine lozenges 4 mg 3 times daily  Resources provided:  AVS   Setting quit date not yet set  Follow-up arranged 2 months   Time spent counseling the patient: 5 minutes   Hypertension Hypertension stable at this time continue current medications  Neck mass Firm nonmovable left neck mass with history of vocal cord  cancer  Referral to ENT  CT of the chest will be ordered   [name] was seen today for follow-up.  Diagnoses and all orders for this visit:  COPD exacerbation ([place]) -     Discontinue: methylPREDNISolone acetate (DEPO-MEDROL) injection 80 mg -     methylPREDNISolone acetate (DEPO-MEDROL) injection 40 mg  Prediabetes -     Glucose (CBG) -     HgB A1c -     Comprehensive metabolic panel  COPD with chronic bronchitis (HCC) -     Discontinue: methylPREDNISolone acetate (DEPO-MEDROL) injection 80 mg -     methylPREDNISolone acetate (DEPO-MEDROL) injection 40 mg -     methylPREDNISolone acetate (DEPO-MEDROL) injection 40 mg  Insomnia, unspecified type -     mirtazapine (REMERON) 7.5 MG tablet; Take 1 tablet (7.5 mg total) by mouth at bedtime.  Paresthesias -     gabapentin (NEURONTIN) 300 MG capsule; Take 1 capsule (300 mg total) by mouth 3 (three) times daily.  Hypertension, unspecified type -     Comprehensive metabolic panel -     metoprolol succinate (TOPROL-XL) 25 MG 24 hr tablet; Take 1  tablet (25 mg total) by mouth daily. For high blood pressure  Hypothyroidism due to acquired atrophy of thyroid -     levothyroxine (SYNTHROID) 175 MCG tablet; Take 1 tablet (175 mcg total) by mouth daily before breakfast. 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 -     albuterol (VENTOLIN HFA) 108 (90 Base) MCG/ACT inhaler; Inhale 2 puffs into the lungs every 4 (four) hours as needed for wheezing or shortness of breath. -     vitamin B-12 (CYANOCOBALAMIN) 1000 MCG tablet; Take 1 tablet (1,000 mcg total) by mouth daily. -     doxycycline (VIBRA-TABS) 100 MG tablet; Take 1 tablet (100 mg total) by mouth 2 (two) times daily.   Saw ENT   Swallow study abn with esoph stricture., GI referrla sent  Needs [place] referral  [facility] on face. And scalp seb ker    Shortness of Breath This is a chronic problem. The current episode started more than 1 year ago. The problem occurs daily. The problem has been gradually worsening. Associated symptoms include chest pain, ear pain, rhinorrhea, a sore throat, sputum production and wheezing. Pertinent negatives include no fever, headaches, hemoptysis, orthopnea, PND or vomiting. The symptoms are aggravated by smoke, odors, exercise and any activity. Associated symptoms comments: Mucus is clear  Voice gone Sore throat. Risk factors include  smoking. He has tried beta agonist inhalers and steroid inhalers for the symptoms. The treatment provided mild relief. His past medical history is significant for COPD. There is no history of asthma, bronchiolitis, CAD, DVT, PE or pneumonia.   Past Medical History:  Diagnosis Date  . COPD (chronic obstructive pulmonary  disease) ([place])   . Headache(784.0)   . Heart murmur    child  . Hx of radiation therapy 05/19/13- 06/27/13   glottic larynx, right true vocal cord 6300 cGy 28 sessions  . Hypertension   . Hypothyroidism   . Influenza B 08/01/2018  . Long term (current) use of opiate analgesic 10/11/2016  . Polysubstance abuse ([place])   . Polysubstance dependence including opioid type drug, episodic abuse, with delirium ([place]) 03/22/2017  . Shortness of breath   . 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 Vocal cord cancer ([place]) 05/01/2013     Family History  Problem Relation Age of Onset  . COPD Mother   . Emphysema Mother   . Heart attack Father      Social History   Socioeconomic History  . Marital status: Legally Separated    Spouse name: Not on file  . Number of children: 3  . Years of education: Not on file  . 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 Smoking status: Current Every Day Smoker    Packs/day: 1.00    Years: 34.00    Pack years: 34.00    Types: Cigarettes  . Smokeless tobacco: Never Used  . Tobacco comment: DOWN TO 4 - 5 CIGARETTES A DAY  Vaping Use  . Vaping Use: Never used  Substance and Sexual Activity  . Alcohol use: No    Alcohol/week: 0.0 standard drinks  . Drug use: Not Currently    Types: Marijuana, Cocaine, Heroin    Comment: He hasn't used drugs in "over a year"- 06/27/19  . Sexual activity: Yes    Birth control/protection: Condom  Other Topics Concern  . Not on file  Social History Narrative   The patient is married. Patient has 3 children.  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   The patient has a history of smoking 2 packs of cigarette a day for 34 years. Patient current is trying to quit smoking. Patient denies use of alcohol. Patient denies use of other illicit drugs.   Social Determinants of Health   Financial Resource Strain: Not on file  Food Insecurity: Not on file  Transportation Needs: Not on file  Physical Activity: Not on file  Stress: Not on file  Social Connections: Not on file  Intimate Partner Violence: Not on file     Allergies  Allergen Reactions  . Fentanyl Nausea And Vomiting  . Citalopram Other (See Comments)    Dizziness and shakiness.  . Morphine And Related Nausea And Vomiting     Outpatient Medications Prior to Visit  Medication Sig Dispense Refill  . albuterol (VENTOLIN HFA) 108 (90 Base) MCG/ACT inhaler Inhale 2 puffs into the lungs every 4 (four) hours as needed for wheezing or shortness of breath. 18 g 1  . doxycycline (VIBRA-TABS) 100 MG tablet Take 1 tablet (100 mg total) by mouth 2 (two) times daily. 14 tablet 0  . fluticasone-salmeterol (ADVAIR HFA) 230-21 MCG/ACT inhaler Inhale 2 puffs into the lungs 2 (two) times daily. 1 each 12  . gabapentin (NEURONTIN) 300 MG capsule Take 1 capsule (300 mg total) by mouth 3 (three) times daily. 180 capsule 3  . levothyroxine (SYNTHROID) 175 MCG tablet Take 1 tablet (175 mcg total) by mouth daily before breakfast. For thyroid hormone replacement 90 tablet 2  . metoprolol succinate (TOPROL-XL) 25 MG  24 hr tablet Take 1 tablet (25 mg total) by mouth daily. For high blood pressure 90 tablet 2  . mirtazapine (REMERON) 7.5 MG tablet Take 1 tablet (7.5 mg total) by mouth at bedtime. 30 tablet 1  . nicotine polacrilex (NICORETTE MINI) 4 MG lozenge Use one lozenge three times daily to quit smoking 100 tablet 4  . vitamin B-12 (CYANOCOBALAMIN) 1000 MCG tablet Take 1 tablet (1,000 mcg total) by mouth daily. 90 tablet 3   No facility-administered  medications prior to visit.     Review of Systems  Constitutional: Negative for fever.  HENT: Positive for ear pain, rhinorrhea, sore throat, trouble swallowing and voice change.   Respiratory: Positive for sputum production, choking, chest tightness, shortness of breath and wheezing. Negative for hemoptysis.   Cardiovascular: Positive for chest pain. Negative for orthopnea and PND.  Gastrointestinal: Negative for vomiting.       Gerd  Endocrine: Positive for polyuria.  Genitourinary: Negative.   Neurological: Negative for headaches.  Psychiatric/Behavioral: Positive for dysphoric mood. Negative for self-injury and suicidal ideas.       Objective:   Physical Exam  There were no vitals taken for this visit.  Gen: Pleasant, thin, in no distress,  normal affect  ENT: No lesions,  mouth clear,  oropharynx clear, no postnasal drip, edentulous  Neck: No JVD, no TMG, no carotid bruits, there is a left-sided firm neck mass at the end of the thyroid cartilage  Lungs: No use of accessory muscles, no dullness to percussion, distant breath sounds, few expired wheezes no rhonchi hyperresonance to percussion Cardiovascular: RRR, heart sounds normal, no murmur or gallops, no peripheral edema  Abdomen: soft and NT, no HSM,  BS normal  Musculoskeletal: No deformities, no cyanosis or clubbing  Neuro: alert, non focal  Skin: Warm, no lesions or rashes  Lab Results  Component Value Date   HGBA1C 6.0 (A) 07/29/2020        Assessment & Plan:  I personally reviewed all images and lab data in the [facility] system as well as any outside material available during this office visit and agree with the  radiology impressions.   No problem-specific Assessment & Plan notes found for this encounter.   There are no diagnoses linked to this encounter.

## 2020-09-27 ENCOUNTER — Emergency Department (HOSPITAL_COMMUNITY): Payer: Self-pay

## 2020-09-27 ENCOUNTER — Other Ambulatory Visit (HOSPITAL_COMMUNITY): Payer: Self-pay | Admitting: Emergency Medicine

## 2020-09-27 ENCOUNTER — Emergency Department (HOSPITAL_COMMUNITY)
Admission: EM | Admit: 2020-09-27 | Discharge: 2020-09-27 | Disposition: A | Discharge status: Home / Self Care | Payer: Self-pay | Attending: Emergency Medicine | Admitting: Emergency Medicine

## 2020-09-27 ENCOUNTER — Other Ambulatory Visit: Payer: Self-pay

## 2020-09-27 ENCOUNTER — Encounter (HOSPITAL_COMMUNITY): Payer: Self-pay

## 2020-09-27 ENCOUNTER — Ambulatory Visit: Payer: Self-pay | Admitting: Critical Care Medicine

## 2020-09-27 DIAGNOSIS — Z7951 Long term (current) use of inhaled steroids: Secondary | ICD-10-CM | POA: Insufficient documentation

## 2020-09-27 DIAGNOSIS — I10 Essential (primary) hypertension: Secondary | ICD-10-CM | POA: Insufficient documentation

## 2020-09-27 DIAGNOSIS — E039 Hypothyroidism, unspecified: Secondary | ICD-10-CM | POA: Insufficient documentation

## 2020-09-27 DIAGNOSIS — R109 Unspecified abdominal pain: Secondary | ICD-10-CM | POA: Insufficient documentation

## 2020-09-27 DIAGNOSIS — J449 Chronic obstructive pulmonary disease, unspecified: Secondary | ICD-10-CM | POA: Insufficient documentation

## 2020-09-27 DIAGNOSIS — Z8521 Personal history of malignant neoplasm of larynx: Secondary | ICD-10-CM | POA: Insufficient documentation

## 2020-09-27 DIAGNOSIS — K219 Gastro-esophageal reflux disease without esophagitis: Secondary | ICD-10-CM | POA: Insufficient documentation

## 2020-09-27 DIAGNOSIS — Z79899 Other long term (current) drug therapy: Secondary | ICD-10-CM | POA: Insufficient documentation

## 2020-09-27 DIAGNOSIS — F17210 Nicotine dependence, cigarettes, uncomplicated: Secondary | ICD-10-CM | POA: Insufficient documentation

## 2020-09-27 LAB — URINALYSIS, ROUTINE W REFLEX MICROSCOPIC
Bilirubin Urine: NEGATIVE
Glucose, UA: NEGATIVE mg/dL
Hgb urine dipstick: NEGATIVE
Ketones, ur: NEGATIVE mg/dL
Leukocytes,Ua: NEGATIVE
Nitrite: NEGATIVE
Protein, ur: NEGATIVE mg/dL
Specific Gravity, Urine: 1.016 (ref 1.005–1.030)
pH: 7 (ref 5.0–8.0)

## 2020-09-27 MED ORDER — HYDROMORPHONE HCL 1 MG/ML IJ SOLN
1.0000 mg | Freq: Once | INTRAMUSCULAR | Status: AC
  Administered 2020-09-27: 1 mg via INTRAMUSCULAR
  Filled 2020-09-27: qty 1

## 2020-09-27 MED ORDER — ONDANSETRON 4 MG PO TBDP
8.0000 mg | ORAL_TABLET | Freq: Once | ORAL | Status: AC
  Administered 2020-09-27: 8 mg via ORAL
  Filled 2020-09-27: qty 2

## 2020-09-27 MED ORDER — DIAZEPAM 5 MG PO TABS
5.0000 mg | ORAL_TABLET | Freq: Once | ORAL | Status: AC
  Administered 2020-09-27: 5 mg via ORAL
  Filled 2020-09-27: qty 1

## 2020-09-27 MED ORDER — KETOROLAC TROMETHAMINE 15 MG/ML IJ SOLN
30.0000 mg | Freq: Once | INTRAMUSCULAR | Status: AC
  Administered 2020-09-27: 30 mg via INTRAMUSCULAR
  Filled 2020-09-27: qty 2

## 2020-09-27 MED ORDER — IBUPROFEN 600 MG PO TABS: 600.0000 mg | ORAL_TABLET | Freq: Four times a day (QID) | ORAL | 0 refills | Status: DC | PRN

## 2020-09-27 MED ORDER — DIAZEPAM 2 MG PO TABS: 2.0000 mg | ORAL_TABLET | Freq: Four times a day (QID) | ORAL | 0 refills | Status: DC | PRN

## 2020-09-27 NOTE — ED Provider Notes (Signed)
[facility] EMERGENCY DEPARTMENT Provider Note   CSN: [id number] Arrival date & time: 09/27/20  1027     History No chief complaint on file.   [name] is a 55 y.o. male.  55 year old male presents with acute onset of right-sided flank pain.  Pain started acutely in his right flank and goes down his leg.  Denies any history of trauma.  No urinary symptoms.  Pain is sharp and severe and worse with movement and better with rest.  No prior history of same.  No bowel or bladder dysfunction.  No treatment use prior to arrival        Past Medical History:  Diagnosis Date  . COPD (chronic obstructive pulmonary disease) ([place])   . Headache(784.0)   . Heart murmur    child  . Hx of radiation therapy 05/19/13- 06/27/13   glottic larynx, right true vocal cord 6300 cGy 28 sessions  . Hypertension   . Hypothyroidism   . Influenza B 08/01/2018  . Long term (current) use of opiate analgesic 10/11/2016  . Polysubstance abuse ([place])   . Polysubstance dependence including opioid type drug, episodic abuse, with delirium ([place]) 03/22/2017  . Shortness of breath   . 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 Vocal cord cancer ([place]) 05/01/2013    Patient Active Problem List   Diagnosis Date Noted  . History of primary laryngeal cancer 08/27/2020  . Prediabetes 07/29/2020  . Neck mass 07/29/2020  . COPD with chronic bronchitis ([place]) 06/24/2019  . Hypertension 06/24/2019  . Anxiety 06/24/2019  . Paresthesias 06/24/2019  . Insomnia 06/24/2019  . Major depressive disorder, recurrent episode, severe ([place]) 08/14/2018  . Homelessness 10/25/2016  . Emphysema of lung ([place]) 12/18/2014  . Esophageal dysphagia 09/10/2014  . Tobacco use disorder 09/10/2014  . Hypothyroidism due to acquired atrophy of thyroid 06/12/2014  . GERD (gastroesophageal reflux disease) 06/12/2014  . Hx of radiation therapy   . History of squamous cell carcinoma of Vocal Cord Treated with  Radiation 05/06/2013  . Hoarseness of voice 03/17/2013    Past Surgical History:  Procedure Laterality Date  . BALLOON DILATION N/A 12/30/2013   Procedure: BALLOON DILATION;  Surgeon: Garlan Fair, MD;  Location: [facility];  Service: Endoscopy;  Laterality: N/A;  . DIRECT LARYNGOSCOPY Right 04/24/2013   Procedure: DIRECT LARYNGOSCOPY and BIOPSY OF TONGUE;  Surgeon: Jerrell Belfast, MD;  Location: [facility];  Service: ENT;  Laterality: Right;  . ESOPHAGEAL MANOMETRY N/A 09/07/2014   Procedure: ESOPHAGEAL MANOMETRY (EM);  Surgeon: Garlan Fair, MD;  Location: [facility];  Service: Endoscopy;  Laterality: N/A;  . ESOPHAGOGASTRODUODENOSCOPY (EGD) WITH PROPOFOL N/A 12/30/2013   Procedure: ESOPHAGOGASTRODUODENOSCOPY (EGD) WITH PROPOFOL;  Surgeon: Garlan Fair, MD;  Location: [facility];  Service: Endoscopy;  Laterality: N/A;  . MICROLARYNGOSCOPY Right 04/24/2013   Procedure: MICROLARYNGOSCOPY and RIGHT VOCAL CORD BIOPSY;  Surgeon: Jerrell Belfast, MD;  Location: [facility];  Service: ENT;  Laterality: Right;  . MULTIPLE EXTRACTIONS WITH ALVEOLOPLASTY N/A 09/30/2013   Procedure: Extraction of tooth #'s 2,3,4,5,6,7,8,9,10,11,12,15,22,23,24,25,26,27,28 with alveoloplasty;  Surgeon: Lenn Cal, DDS;  Location: [facility];  Service: Oral Surgery;  Laterality: N/A;  . TONSILLECTOMY     as a child  . wisdon teeth     age 73       Family History  Problem Relation Age of Onset  . COPD Mother   . Emphysema Mother   . Heart attack Father     Social  History   Tobacco Use  . Smoking status: Current Every Day Smoker    Packs/day: 1.00    Years: 34.00    Pack years: 34.00    Types: Cigarettes  . Smokeless tobacco: Never Used  . Tobacco comment: DOWN TO 4 - 5 CIGARETTES A DAY  Vaping Use  . Vaping Use: Never used  Substance Use Topics  . Alcohol use: No    Alcohol/week: 0.0 standard drinks  . Drug use: Not Currently    Types: Marijuana, Cocaine, Heroin    Comment: He hasn't used  drugs in "over a year"- 06/27/19    Home Medications Prior to Admission medications   Medication Sig Start Date End Date Taking? Authorizing Provider  albuterol (VENTOLIN HFA) 108 (90 Base) MCG/ACT inhaler Inhale 2 puffs into the lungs every 4 (four) hours as needed for wheezing or shortness of breath. 07/29/20   [name], MD  doxycycline (VIBRA-TABS) 100 MG tablet Take 1 tablet (100 mg total) by mouth 2 (two) times daily. 07/29/20   [name], MD  fluticasone-salmeterol (ADVAIR HFA) 3144215772 MCG/ACT inhaler Inhale 2 puffs into the lungs 2 (two) times daily. 07/29/20   [name], MD  gabapentin (NEURONTIN) 300 MG capsule Take 1 capsule (300 mg total) by mouth 3 (three) times daily. 07/29/20   [name], MD  levothyroxine (SYNTHROID) 175 MCG tablet Take 1 tablet (175 mcg total) by mouth daily before breakfast. For thyroid hormone replacement 07/29/20   [name], MD  metoprolol succinate (TOPROL-XL) 25 MG 24 hr tablet Take 1 tablet (25 mg total) by mouth daily. For high blood pressure 07/29/20   [name], MD  mirtazapine (REMERON) 7.5 MG tablet Take 1 tablet (7.5 mg total) by mouth at bedtime. 07/29/20   [name], MD  nicotine polacrilex (NICORETTE MINI) 4 MG lozenge Use one lozenge three times daily to quit smoking 07/29/20   [name], MD  vitamin B-12 (CYANOCOBALAMIN) 1000 MCG tablet Take 1 tablet (1,000 mcg total) by mouth daily. 07/29/20   [name], MD    Allergies    Fentanyl, Citalopram, and Morphine and related  Review of Systems   Review of Systems  All other systems reviewed and are negative.   Physical Exam Updated Vital Signs BP 125/90 (BP Location: Left Arm)   Pulse 72   Temp 97.6 F (36.4 C) (Oral)   Resp 19   SpO2 100%   Physical Exam Vitals and nursing note reviewed.  Constitutional:      General: He is not in acute distress.    Appearance: Normal appearance. He is well-developed and well-nourished. He is not  toxic-appearing.  HENT:     Head: Normocephalic and atraumatic.  Eyes:     General: Lids are normal.     Extraocular Movements: EOM normal.     Conjunctiva/sclera: Conjunctivae normal.     Pupils: Pupils are equal, round, and reactive to light.  Neck:     Thyroid: No thyroid mass.     Trachea: No tracheal deviation.  Cardiovascular:     Rate and Rhythm: Normal rate and regular rhythm.     Heart sounds: Normal heart sounds. No murmur heard. No gallop.   Pulmonary:     Effort: Pulmonary effort is normal. No respiratory distress.     Breath sounds: Normal breath sounds. No stridor. No decreased breath sounds, wheezing, rhonchi or rales.  Abdominal:     General: Bowel sounds are normal.  There is no distension.     Palpations: Abdomen is soft.     Tenderness: There is no abdominal tenderness. There is no CVA tenderness or rebound.  Musculoskeletal:        General: No tenderness or edema. Normal range of motion.     Cervical back: Normal range of motion and neck supple.       Back:  Skin:    General: Skin is warm and dry.     Findings: No abrasion or rash.  Neurological:     Mental Status: He is alert and oriented to person, place, and time.     GCS: GCS eye subscore is 4. GCS verbal subscore is 5. GCS motor subscore is 6.     Cranial Nerves: No cranial nerve deficit.     Sensory: No sensory deficit.     Deep Tendon Reflexes: Strength normal.     Comments: Strength is 5 of 5 in lower extremities  Psychiatric:        Attention and Perception: Attention normal.        Mood and Affect: Mood is anxious.     ED Results / Procedures / Treatments   Labs (all labs ordered are listed, but only abnormal results are displayed) Labs Reviewed  URINALYSIS, ROUTINE W REFLEX MICROSCOPIC    EKG None  Radiology No results found.  Procedures Procedures   Medications Ordered in ED Medications  diazepam (VALIUM) tablet 5 mg (has no administration in time range)  HYDROmorphone  (DILAUDID) injection 1 mg (has no administration in time range)  ondansetron (ZOFRAN-ODT) disintegrating tablet 8 mg (has no administration in time range)    ED Course  I have reviewed the triage vital signs and the nursing notes.  Pertinent labs & imaging results that were available during my care of the patient were reviewed by me and considered in my medical decision making (see chart for details).    MDM Rules/Calculators/A&P                          Patient medicated here and feels better.  CT scan without acute findings.  Was pinpoint tender in his flanks suspect musculoskeletal strain.  Urinalysis negative.  Will discharge home Final Clinical Impression(s) / ED Diagnoses Final diagnoses:  None    Rx / DC Orders ED Discharge Orders    None       Lacretia Leigh, MD 09/27/20 1356

## 2020-09-27 NOTE — ED Notes (Signed)
Urine culture sent with urine sample. No order for culture at this time.

## 2020-09-27 NOTE — ED Triage Notes (Signed)
Patient complains of back/right hip pain that started yesterday. Today reports too painful to ambulate. Denies any trauma

## 2020-09-28 ENCOUNTER — Telehealth: Payer: Self-pay | Admitting: Critical Care Medicine

## 2020-09-28 MED ORDER — DIAZEPAM 2 MG PO TABS: 2.0000 mg | ORAL_TABLET | Freq: Four times a day (QID) | ORAL | 0 refills | Status: DC | PRN

## 2020-09-28 MED ORDER — IBUPROFEN 600 MG PO TABS: 600.0000 mg | ORAL_TABLET | Freq: Four times a day (QID) | ORAL | 0 refills | Status: DC | PRN

## 2020-09-28 NOTE — Telephone Encounter (Signed)
I just now sent the rx the ED sent to our pharmacy over to his [place]

## 2020-09-28 NOTE — Telephone Encounter (Signed)
Pt wants ibuprofen (ADVIL) 600 MG tablet & diazepam (VALIUM) 2 MG tablet  Sent to the [facility] on [place] ([place]), [place] - [address]  [address] [place] ([place]) [place] [address]  Phone: [phone] Fax: [phone]

## 2020-09-28 NOTE — Telephone Encounter (Addendum)
Patient calling back checking on the status of request mentioned below. Patient states he is currently at [facility] and would like a follow up call [phone]

## 2020-09-28 NOTE — Addendum Note (Signed)
Addended by: [name] on: 09/28/2020 04:46 PM   Modules accepted: Orders

## 2020-09-28 NOTE — Telephone Encounter (Signed)
Per initial encounter, Pt wants ibuprofen (ADVIL) 600 MG tablet & diazepam (VALIUM) 2 MG tablet Sent to the [facility] on [place] ([place]), [place] - [address]  [address] [place] ([place]) [place] [address]  Phone: [phone] Fax: [phone]   Pt's new prescriptions were originally sent to phamacy at [facility] on 09/27/20; he would like to have them sent to [facility]; will route to office for final disposition

## 2020-11-01 NOTE — Progress Notes (Deleted)
Subjective:    Patient ID: [name], male    DOB: 05-08-1966, 55 y.o.   MRN: [id number]  06/24/19 55 y.o.M here for f/u COPD Homelessnes This patient has [place] and was seen previously in the clinic earlier in November by our physician assistant.  Documentation from that visit is as below.   [name] is a 55 y.o. male here to establish care and as hospital follow up.  He went to ED for med RF.  Homeless.  Out of thyroid meds ~ 1 month before restarting thyroid meds on 05/13/2019.  Denies drinking much alcohol.  C/o paresthesias in fingers and toes not controlled on gabapentin 200mg  tid.  Also severe fatigue.  Poor breathing.  Still smoking but trying to cut back.  H/o thyroid CA s/p radiation.     GABA did not help   1. Paresthesias - Thyroid Panel With TSH - gabapentin (NEURONTIN) 300 MG capsule; Take 1 capsule (300 mg total) by mouth 3 (three) times daily.  Dispense: 90 capsule; Refill: 3 - Vitamin B12 - Folate  2. Hypothyroidism due to acquired atrophy of thyroid Will not adjust based on this reading bc he just resumed meds 10/20 - Thyroid Panel With TSH - levothyroxine (SYNTHROID) 175 MCG tablet; Take 1 tablet (175 mcg total) by mouth daily before breakfast. For thyroid hormone replacement  Dispense: 30 tablet; Refill: 2  3. Fatigue, unspecified type  4. Insomnia, unspecified type  - mirtazapine (REMERON) 7.5 MG tablet; Take 1 tablet (7.5 mg total) by mouth at bedtime. For sleep  Dispense: 30 tablet; Refill: 0 - traZODone (DESYREL) 150 MG tablet; Take 1 tablet (150 mg total) by mouth at bedtime as needed for sleep.  Dispense: 30 tablet; Refill: 3  5. Anxiety - hydrOXYzine (ATARAX/VISTARIL) 25 MG tablet; Take 1 tablet (25 mg total) by mouth 3 (three) times daily as needed for anxiety.  Dispense: 60 tablet; Refill: 0  6. Hypertension, unspecified type Not controlled-resume meds - metoprolol succinate (TOPROL-XL) 25 MG 24 hr  tablet; Take 1 tablet (25 mg total) by mouth daily. For high blood pressure  Dispense: 30 tablet; Refill: 2  7. Homeless I am - Ambulatory referral to Social Work     8. Shortness of breath -unclear if he has emphysema or COPD?;  Will add- - Fluticasone-Salmeterol (ADVAIR DISKUS) 250-50 MCG/DOSE AEPB; Inhale 1 puff into the lungs 2 (two) times daily.  Dispense: 1 each; Refill: 3 01/06/20 The patient states the insomnia has been improved the use of Remeron and trazodone.  He states hydroxyzine also has helped the anxiety levels.  He also is taking the metoprolol 25 mg daily and this has been helpful as well.  He is using the Advair as well at 1 puff twice a day and this is helped his breathing.  He has not had a recent chest x-ray.  The patient also resume the levothyroxine and note lab results in the last visit showed still hypothyroidism and part of this was he was out of medication.  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  He has not been seen in follow-up in some time.  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  He also does not  drink alcohol.  He is still smoking however a pack a day of cigarettes  The patient does have very mild dysphagia at this time Note B12 levels did come back low and these were checked to evaluate for paresthesias therefore we will need to r begin B12 supplementation  01/05/2020 Since the last visit in December 2020 the patient continues to smoke a half a pack a day. He is maintaining his blood pressure medications. He has had no further flareups of his lung disease but has run out of his inhalers. The patient's not been emergency room for his breathing since the last visit. He is living in a tent encampment time friend's home in  [place]. He still has some swallowing issues and is edentulous. He states his dentures did not fit him very well. He is able to maintain some nutrition. Note his weight is down to 141 with a BMI of 19.6  He was not able to achieve an ENT appointment to follow-up on prior history of vocal cord cancer status post radiation to the neck. The cancer center stated he would need to see an ENT for evaluation. The patient is requesting 90-day refills on medications. He has been compliant with his Synthroid. He needs follow-up labs on the Synthroid. He is yet to receive his Covid vaccine.  04/07/2020 Patient returns in follow-up for his COPD and hypertension.  He now has a trailer to live down.  He complains of increased shortness of breath and cough.  He is smoking still but less.  He did receive his [place] vaccine through a homebound vaccine program  07/29/2020 Patient seen return follow-up and notes increased shortness of breath increased cough thick black mucus also notes increased swelling in the left neck increased hoarseness and inability to swallow as effectively.  Prior history of vocal cord cancer status post radiation therapy.  He is also on a dry powdered inhaler Advair which is creating difficulty with his cough and his upper airway.  Note this patient still is homeless living in a tent environment.  He is looking for a Materna booster and he was already in the homebound vaccine program  11/02/20   Shortness of Breath This is a chronic problem. The current episode started more than 1 year ago. The problem occurs daily. The problem has been gradually worsening. Associated symptoms include chest pain, ear pain, rhinorrhea, a sore throat, sputum production and wheezing. Pertinent negatives include no fever, headaches, hemoptysis, orthopnea, PND or vomiting. The symptoms are aggravated by smoke, odors, exercise and any activity. Associated symptoms comments: Mucus is clear  Voice gone Sore  throat. Risk factors include smoking. He has tried beta agonist inhalers and steroid inhalers for the symptoms. The treatment provided mild relief. His past medical history is significant for COPD. There is no history of asthma, bronchiolitis, CAD, DVT, PE or pneumonia.   Past Medical History:  Diagnosis Date  . COPD (chronic obstructive pulmonary disease) ([place])   . Headache(784.0)   . Heart murmur    child  . Hx of radiation therapy 05/19/13- 06/27/13   glottic larynx, right true vocal cord 6300 cGy 28 sessions  . Hypertension   . Hypothyroidism   . Influenza B 08/01/2018  . Long term (current) use of opiate analgesic 10/11/2016  . Polysubstance abuse ([place])   . Polysubstance dependence including opioid type drug, episodic abuse, with delirium ([place]) 03/22/2017  . Shortness of breath   . 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  Vocal cord cancer ([place]) 05/01/2013     Family History  Problem Relation Age of Onset  . COPD Mother   . Emphysema Mother   . Heart attack Father      Social History   Socioeconomic History  . Marital status: Legally Separated    Spouse name: Not on file  . Number of children: 3  . Years of education: Not on file  . 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 Smoking status: Current Every Day Smoker    Packs/day: 1.00    Years: 34.00    Pack years: 34.00    Types: Cigarettes  . Smokeless tobacco: Never Used  . Tobacco comment: DOWN TO 4 - 5 CIGARETTES A DAY  Vaping Use  . Vaping Use: Never used  Substance and Sexual Activity  . Alcohol use: No    Alcohol/week: 0.0 standard drinks  . Drug use: Not Currently    Types: Marijuana, Cocaine, Heroin    Comment: He hasn't used drugs in "over a year"- 06/27/19  . Sexual activity: Yes    Birth control/protection: Condom  Other Topics Concern  . Not on file  Social History Narrative   The patient is  married. Patient has 3 children. 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   The patient has a history of smoking 2 packs of cigarette a day for 34 years. Patient current is trying to quit smoking. Patient denies use of alcohol. Patient denies use of other illicit drugs.   Social Determinants of Health   Financial Resource Strain: Not on file  Food Insecurity: Not on file  Transportation Needs: Not on file  Physical Activity: Not on file  Stress: Not on file  Social Connections: Not on file  Intimate Partner Violence: Not on file     Allergies  Allergen Reactions  . Fentanyl Nausea And Vomiting  . Citalopram Other (See Comments)    Dizziness and shakiness.  . Morphine And Related Nausea And Vomiting     Outpatient Medications Prior to Visit  Medication Sig Dispense Refill  . albuterol (VENTOLIN HFA) 108 (90 Base) MCG/ACT inhaler INHALE 2 PUFFS INTO THE LUNGS EVERY 4 (FOUR) HOURS AS NEEDED FOR WHEEZING OR SHORTNESS OF BREATH. 18 g 1  . diazepam (VALIUM) 2 MG tablet Take 1 tablet (2 mg total) by mouth every 6 (six) hours as needed for muscle spasms. 12 tablet 0  . doxycycline (VIBRA-TABS) 100 MG tablet TAKE 1 TABLET (100 MG TOTAL) BY MOUTH 2 (TWO) TIMES DAILY. 14 tablet 0  . fluticasone-salmeterol (ADVAIR HFA) 230-21 MCG/ACT inhaler INHALE 2 PUFFS INTO THE LUNGS 2 (TWO) TIMES DAILY. 12 g 12  . gabapentin (NEURONTIN) 300 MG capsule TAKE 1 CAPSULE (300 MG TOTAL) BY MOUTH 3 (THREE) TIMES DAILY. 180 capsule 3  . ibuprofen (ADVIL) 600 MG tablet Take 1 tablet (600 mg total) by mouth every 6 (six) hours as needed. 30 tablet 0  . levothyroxine (SYNTHROID) 175 MCG tablet TAKE 1 TABLET (175 MCG TOTAL) BY MOUTH DAILY BEFORE BREAKFAST. FOR THYROID HORMONE REPLACEMENT 90 tablet 2  . metoprolol succinate (TOPROL-XL) 25 MG 24 hr tablet TAKE 1 TABLET (25 MG TOTAL) BY MOUTH DAILY. FOR HIGH BLOOD PRESSURE 90 tablet 2  . mirtazapine (REMERON) 7.5 MG tablet TAKE 1 TABLET (7.5 MG TOTAL) BY MOUTH AT  BEDTIME. 30 tablet 1  . nicotine polacrilex (COMMIT)  4 MG lozenge USE ONE LOZENGE THREE TIMES DAILY TO QUIT SMOKING 100 lozenge 4  . vitamin B-12 (CYANOCOBALAMIN) 1000 MCG tablet Take 1 tablet (1,000 mcg total) by mouth daily. 90 tablet 3   No facility-administered medications prior to visit.     Review of Systems  Constitutional: Negative for fever.  HENT: Positive for ear pain, rhinorrhea, sore throat, trouble swallowing and voice change.   Respiratory: Positive for sputum production, choking, chest tightness, shortness of breath and wheezing. Negative for hemoptysis.   Cardiovascular: Positive for chest pain. Negative for orthopnea and PND.  Gastrointestinal: Negative for vomiting.       Gerd  Endocrine: Positive for polyuria.  Genitourinary: Negative.   Neurological: Negative for headaches.  Psychiatric/Behavioral: Positive for dysphoric mood. Negative for self-injury and suicidal ideas.       Objective:   Physical Exam  There were no vitals taken for this visit.  Gen: Pleasant, thin, in no distress,  normal affect  ENT: No lesions,  mouth clear,  oropharynx clear, no postnasal drip, edentulous  Neck: No JVD, no TMG, no carotid bruits, there is a left-sided firm neck mass at the end of the thyroid cartilage  Lungs: No use of accessory muscles, no dullness to percussion, distant breath sounds, few expired wheezes no rhonchi hyperresonance to percussion Cardiovascular: RRR, heart sounds normal, no murmur or gallops, no peripheral edema  Abdomen: soft and NT, no HSM,  BS normal  Musculoskeletal: No deformities, no cyanosis or clubbing  Neuro: alert, non focal  Skin: Warm, no lesions or rashes  Lab Results  Component Value Date   HGBA1C 6.0 (A) 07/29/2020        Assessment & Plan:  I personally reviewed all images and lab data in the [facility] system as well as any outside material available during this office visit and agree with the  radiology impressions.   No  problem-specific Assessment & Plan notes found for this encounter.   There are no diagnoses linked to this encounter.

## 2020-11-02 ENCOUNTER — Ambulatory Visit: Payer: Self-pay | Admitting: Critical Care Medicine

## 2021-09-16 NOTE — Addendum Note (Signed)
Encounter addended by: Annie Paras on: 09/16/2021 2:01 PM  Actions taken: Letter saved

## 2021-10-11 ENCOUNTER — Encounter: Payer: Self-pay | Admitting: Critical Care Medicine

## 2021-10-11 ENCOUNTER — Ambulatory Visit: Payer: Self-pay | Attending: Critical Care Medicine | Admitting: Critical Care Medicine

## 2021-10-11 ENCOUNTER — Other Ambulatory Visit: Payer: Self-pay

## 2021-10-11 VITALS — BP 115/84 | HR 95 | Wt 158.4 lb

## 2021-10-11 DIAGNOSIS — E034 Atrophy of thyroid (acquired): Secondary | ICD-10-CM

## 2021-10-11 DIAGNOSIS — F17200 Nicotine dependence, unspecified, uncomplicated: Secondary | ICD-10-CM

## 2021-10-11 DIAGNOSIS — Z923 Personal history of irradiation: Secondary | ICD-10-CM

## 2021-10-11 DIAGNOSIS — I10 Essential (primary) hypertension: Secondary | ICD-10-CM

## 2021-10-11 DIAGNOSIS — R221 Localized swelling, mass and lump, neck: Secondary | ICD-10-CM

## 2021-10-11 DIAGNOSIS — Z8589 Personal history of malignant neoplasm of other organs and systems: Secondary | ICD-10-CM

## 2021-10-11 DIAGNOSIS — R1319 Other dysphagia: Secondary | ICD-10-CM

## 2021-10-11 DIAGNOSIS — G4700 Insomnia, unspecified: Secondary | ICD-10-CM

## 2021-10-11 DIAGNOSIS — R202 Paresthesia of skin: Secondary | ICD-10-CM

## 2021-10-11 DIAGNOSIS — J449 Chronic obstructive pulmonary disease, unspecified: Secondary | ICD-10-CM

## 2021-10-11 DIAGNOSIS — F332 Major depressive disorder, recurrent severe without psychotic features: Secondary | ICD-10-CM

## 2021-10-11 DIAGNOSIS — Z139 Encounter for screening, unspecified: Secondary | ICD-10-CM

## 2021-10-11 DIAGNOSIS — R070 Pain in throat: Secondary | ICD-10-CM

## 2021-10-11 MED ORDER — MIRTAZAPINE 7.5 MG PO TABS
ORAL_TABLET | ORAL | 1 refills | Status: DC
  Filled 2021-10-11: qty 30, 30d supply, fill #0
  Filled 2021-11-14 – 2022-04-03 (×2): qty 30, 30d supply, fill #1

## 2021-10-11 MED ORDER — ALBUTEROL SULFATE HFA 108 (90 BASE) MCG/ACT IN AERS
2.0000 | INHALATION_SPRAY | RESPIRATORY_TRACT | 1 refills | Status: DC | PRN
  Filled 2021-10-11: qty 18, 25d supply, fill #0

## 2021-10-11 MED ORDER — GABAPENTIN 300 MG PO CAPS
ORAL_CAPSULE | Freq: Three times a day (TID) | ORAL | 3 refills | Status: DC
  Filled 2021-10-11: qty 90, 30d supply, fill #0

## 2021-10-11 MED ORDER — FLUTICASONE-SALMETEROL 230-21 MCG/ACT IN AERO
2.0000 | INHALATION_SPRAY | Freq: Two times a day (BID) | RESPIRATORY_TRACT | 12 refills | Status: DC
  Filled 2021-10-11: qty 12, fill #0

## 2021-10-11 MED ORDER — SERTRALINE HCL 50 MG PO TABS
50.0000 mg | ORAL_TABLET | Freq: Every day | ORAL | 3 refills | Status: DC
  Filled 2021-10-11: qty 30, 30d supply, fill #0

## 2021-10-11 MED ORDER — FLUTICASONE PROPIONATE 50 MCG/ACT NA SUSP
2.0000 | Freq: Every day | NASAL | 6 refills | Status: DC
  Filled 2021-10-11: qty 16, 25d supply, fill #0

## 2021-10-11 MED ORDER — BUDESONIDE-FORMOTEROL FUMARATE 80-4.5 MCG/ACT IN AERO
2.0000 | INHALATION_SPRAY | Freq: Two times a day (BID) | RESPIRATORY_TRACT | 3 refills | Status: DC
  Filled 2021-10-11: qty 10.2, 25d supply, fill #0
  Filled 2021-10-11: qty 10.2, 30d supply, fill #0

## 2021-10-11 MED ORDER — LEVOTHYROXINE SODIUM 175 MCG PO TABS
ORAL_TABLET | ORAL | 2 refills | Status: DC
  Filled 2021-10-11: qty 30, 30d supply, fill #0
  Filled 2022-04-03: qty 30, 30d supply, fill #1

## 2021-10-11 NOTE — Assessment & Plan Note (Signed)
Still a sense of fullness in the neck ?Difficulty swallowing with stricture midesophagus ? ?Plan CT of the neck soft tissues to see for recurrence since we cannot get him in for direct exam with ENT ?

## 2021-10-11 NOTE — Assessment & Plan Note (Signed)
? ? ??   Current smoking consumption amount: 1/2 pack a day ? ?? Dicsussion on advise to quit smoking and smoking impacts: Lung health ? ?? Patient's willingness to quit: Willing ? ?? Methods to quit smoking discussed: Psychiatry referral behavioral modification and nicotine replacement ? ?? Medication management of smoking session drugs discussed: Failed nicotine replacement topical patches will attempt to use nicotine lozenges 4 mg 3 times daily ? ?? Resources provided:  AVS  ? ?? Setting quit date not yet set ? ?? Follow-up arranged 2 months ? ? ?Time spent counseling the patient: 5 minutes ? ?

## 2021-10-11 NOTE — Patient Instructions (Addendum)
All medications refilled sent to our pharmacy downstairs ? ?Use skin moisturizer for the rash on your back and chest ? ?Resume your thyroid medicine daily ? ?We had to change her to Symbicort 160 two puff  twice daily for your breathing medicine because the patient assistance program has expired for Advair ? ?CT scanning of the neck will be obtained to evaluate for cancer recurrence ? ?Pick up financial assistance application at checkout to apply for orange card again ? ?Start sertraline 50 mg daily for depression and anxiety ? ?I refilled Remeron to take as needed for sleep ? ?I would prefer you not refill the Valium as its not a good long-term medication for you ? ?Return to see Dr. Joya Gaskins 2 months ?

## 2021-10-11 NOTE — Assessment & Plan Note (Signed)
Begin trial of sertraline 50 mg daily ?

## 2021-10-11 NOTE — Assessment & Plan Note (Signed)
We will refill inhaled medications ?

## 2021-10-11 NOTE — Assessment & Plan Note (Signed)
No longer drinking alcohol will refill Remeron ?

## 2021-10-11 NOTE — Assessment & Plan Note (Signed)
May yet require dilation but cannot get into specialty because of no insurance and no orange card ?

## 2021-10-11 NOTE — Assessment & Plan Note (Signed)
Patient has significant hypothyroidism and is symptomatic from this has been off his Synthroid for 5 months we will refill same ?

## 2021-10-11 NOTE — Assessment & Plan Note (Signed)
I am concerned her this patient may be dealing with recurrent cancer I have planned neck CT scan soft tissues ?

## 2021-10-11 NOTE — Assessment & Plan Note (Signed)
Normotensive off metoprolol we will hold metoprolol for now ?

## 2021-10-11 NOTE — Progress Notes (Signed)
? ?Subjective:  ? ? Patient ID: [name], male    DOB: 07-Aug-1965, 56 y.o.   MRN: [id number] ? ?06/24/19 ?56 y.o.M here for f/u COPD Homelessnes ?This patient [place] and was seen previously in the clinic earlier in November by our physician assistant.  Documentation from that visit is as below. ? ? ?[name] is a 56 y.o. male here to establish care and as hospital follow up.  He went to ED for med RF.  Homeless.  Out of thyroid meds ~ 1 month before restarting thyroid meds on 05/13/2019.  Denies drinking much alcohol.  C/o paresthesias in fingers and toes not controlled on gabapentin '200mg'$  tid.  Also severe fatigue.  Poor breathing.  Still smoking but trying to cut back.  H/o thyroid CA s/p radiation.   ?  GABA did not help  ? ?1. Paresthesias ?- Thyroid Panel With TSH ?- gabapentin (NEURONTIN) 300 MG capsule; Take 1 capsule (300 mg total) by mouth 3 (three) times daily.  Dispense: 90 capsule; Refill: 3 ?- Vitamin B12 ?- Folate ?  ?2. Hypothyroidism due to acquired atrophy of thyroid ?Will not adjust based on this reading bc he just resumed meds 10/20 ?- Thyroid Panel With TSH ?- levothyroxine (SYNTHROID) 175 MCG tablet; Take 1 tablet (175 mcg total) by mouth daily before breakfast. For thyroid hormone replacement  Dispense: 30 tablet; Refill: 2 ?  ?3. Fatigue, unspecified type ?  ?4. Insomnia, unspecified type ?  ?- mirtazapine (REMERON) 7.5 MG tablet; Take 1 tablet (7.5 mg total) by mouth at bedtime. For sleep  Dispense: 30 tablet; Refill: 0 ?- traZODone (DESYREL) 150 MG tablet; Take 1 tablet (150 mg total) by mouth at bedtime as needed for sleep.  Dispense: 30 tablet; Refill: 3 ?  ?5. Anxiety ?- hydrOXYzine (ATARAX/VISTARIL) 25 MG tablet; Take 1 tablet (25 mg total) by mouth 3 (three) times daily as needed for anxiety.  Dispense: 60 tablet; Refill: 0 ?  ?6. Hypertension, unspecified type ?Not controlled-resume meds ?- metoprolol succinate (TOPROL-XL) 25 MG 24 hr  tablet; Take 1 tablet (25 mg total) by mouth daily. For high blood pressure  Dispense: 30 tablet; Refill: 2 ?  ?7. Homeless I am ?- Ambulatory referral to Social Work    ?  ?8. Shortness of breath ?-unclear if he has emphysema or COPD?;  Will add- ?- Fluticasone-Salmeterol (ADVAIR DISKUS) 250-50 MCG/DOSE AEPB; Inhale 1 puff into the lungs 2 (two) times daily.  Dispense: 1 each; Refill: 3 ? 01/06/20 ?The patient states the insomnia has been improved the use of Remeron and trazodone.  He states hydroxyzine also has helped the anxiety levels.  He also is taking the metoprolol 25 mg daily and this has been helpful as well.  He is using the Advair as well at 1 puff twice a day and this is helped his breathing.  He has not had a recent chest x-ray.  The patient also resume the levothyroxine and note lab results in the last visit showed still hypothyroidism and part of this was he was out of medication.  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  He has not been seen in follow-up in some time.  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 ? ?Note the patient did formally use heroin but he has been free of substance  abuse for many years.  He also does not drink alcohol.  He is still smoking however a pack a day of cigarettes ? ?The patient does have very mild dysphagia at this time ?Note B12 levels did come back low and these were checked to evaluate for paresthesias therefore we will need to r begin B12 supplementation ? ?01/05/2020 ?Since the last visit in December 2020 the patient continues to smoke a half a pack a day. He is maintaining his blood pressure medications. He has had no further flareups of his lung disease but has run out of his inhalers. The patient's not been emergency room for his breathing since the last visit. He is living in a tent encampment time friend's home in  [place]. He still has some swallowing issues and is edentulous. He states his dentures did not fit him very well. He is able to maintain some nutrition. Note his weight is down to 141 with a BMI of 19.6 ? ?He was not able to achieve an ENT appointment to follow-up on prior history of vocal cord cancer status post radiation to the neck. The cancer center stated he would need to see an ENT for evaluation. ?The patient is requesting 90-day refills on medications. He has been compliant with his Synthroid. He needs follow-up labs on the Synthroid. He is yet to receive his Covid vaccine. ? ?04/07/2020 ?Patient returns in follow-up for his COPD and hypertension.  He now has a trailer to live down.  He complains of increased shortness of breath and cough.  He is smoking still but less.  He did receive his [place] vaccine through a homebound vaccine program ? ?07/29/2020 ?Patient seen return follow-up and notes increased shortness of breath increased cough thick black mucus also notes increased swelling in the left neck increased hoarseness and inability to swallow as effectively.  Prior history of vocal cord cancer status post radiation therapy.  He is also on a dry powdered inhaler Advair which is creating difficulty with his cough and his upper airway. ? ?Note this patient still is homeless living in a tent environment. ? ?He is looking for a Materna booster and he was already in the homebound vaccine program ? ?10/11/21 ?Patient returns in follow-up has not been seen in over a year.  He no longer lives in a tent he is now living in housing in [place].  He has been out of all his medications for 5 months.  He has severe hypothyroidism from previous radiotherapy to the neck.  He still having difficulty swallowing.  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  He has troubles with swallowing.  He still has no insurance not able to see any  specialist.  He cannot get in with ENT because able cause him to pay out-of-pocket.  He has dry skin with some itching.  He still has paresthesias in the hands and feet.  His PHQ 9 and GAD-7 are extremely elevated.  The patient is not suicidal.  Chest CT scan at the last visit was negative except for emphysema.  Patient is still smoking a half a pack a day of cigarettes.  Blood pressure on arrival 115/84 and has been off metoprolol for 5 months. ? ?Shortness of Breath ?This is a chronic problem. The current episode started more than 1 year ago. The problem occurs daily. The problem has been gradually worsening. Associated symptoms include chest pain, rhinorrhea, a sore  throat, sputum production and wheezing. Pertinent negatives include no ear pain, fever, headaches, hemoptysis, orthopnea, PND or vomiting. The symptoms are aggravated by smoke, odors, exercise and any activity. Associated symptoms comments: Mucus is clear  ?Voice gone ?Sore throat. Risk factors include smoking. He has tried beta agonist inhalers and steroid inhalers for the symptoms. The treatment provided mild relief. His past medical history is significant for COPD. There is no history of asthma, bronchiolitis, CAD, DVT, PE or pneumonia.  ?Past Medical History:  ?Diagnosis Date  ? COPD (chronic obstructive pulmonary disease) ([place])   ? Headache(784.0)   ? Heart murmur   ? child  ? Hx of radiation therapy 05/19/13- 06/27/13  ? glottic larynx, right true vocal cord 6300 cGy 28 sessions  ? Hypertension   ? Hypothyroidism   ? Influenza B 08/01/2018  ? Long term (current) use of opiate analgesic 10/11/2016  ? Polysubstance abuse ([place])   ? Polysubstance dependence including opioid type drug, episodic abuse, with delirium ([place]) 03/22/2017  ? Shortness of breath   ? 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 Vocal cord cancer ([place]) 05/01/2013  ?  ? ?Family History  ?Problem Relation Age of Onset  ? COPD Mother   ? Emphysema Mother   ? Heart attack  Father   ?  ? ?Social History  ? ?Socioeconomic History  ? Marital status: Legally Separated  ?  Spouse name: Not on file  ? Number of children: 3  ? Years of education: Not on file  ? Highest education level: Not

## 2021-10-12 ENCOUNTER — Other Ambulatory Visit: Payer: Self-pay | Admitting: Critical Care Medicine

## 2021-10-12 ENCOUNTER — Telehealth: Payer: Self-pay

## 2021-10-12 DIAGNOSIS — E034 Atrophy of thyroid (acquired): Secondary | ICD-10-CM

## 2021-10-12 LAB — THYROID PANEL WITH TSH
T3 Uptake Ratio: 15 % — ABNORMAL LOW (ref 24–39)
T4, Total: <0.4 ug/dL — CL (ref 4.5–12.0)
TSH: 147 u[IU]/mL — ABNORMAL HIGH (ref 0.450–4.500)

## 2021-10-12 LAB — CBC WITH DIFFERENTIAL/PLATELET
Basophils Absolute: 0.1 10*3/uL (ref 0.0–0.2)
Basos: 1 %
EOS (ABSOLUTE): 0.9 10*3/uL — ABNORMAL HIGH (ref 0.0–0.4)
Eos: 16 %
Hematocrit: 36.3 % — ABNORMAL LOW (ref 37.5–51.0)
Hemoglobin: 12.6 g/dL — ABNORMAL LOW (ref 13.0–17.7)
Immature Grans (Abs): 0 10*3/uL (ref 0.0–0.1)
Immature Granulocytes: 0 %
Lymphocytes Absolute: 1.4 10*3/uL (ref 0.7–3.1)
Lymphs: 27 %
MCH: 34.5 pg — ABNORMAL HIGH (ref 26.6–33.0)
MCHC: 34.7 g/dL (ref 31.5–35.7)
MCV: 100 fL — ABNORMAL HIGH (ref 79–97)
Monocytes Absolute: 0.2 10*3/uL (ref 0.1–0.9)
Monocytes: 4 %
Neutrophils Absolute: 2.7 10*3/uL (ref 1.4–7.0)
Neutrophils: 52 %
Platelets: 178 10*3/uL (ref 150–450)
RBC: 3.65 x10E6/uL — ABNORMAL LOW (ref 4.14–5.80)
RDW: 13.5 % (ref 11.6–15.4)
WBC: 5.2 10*3/uL (ref 3.4–10.8)

## 2021-10-12 LAB — LIPID PANEL
Chol/HDL Ratio: 5 ratio (ref 0.0–5.0)
Cholesterol, Total: 223 mg/dL — ABNORMAL HIGH (ref 100–199)
HDL: 45 mg/dL (ref >39)
LDL Chol Calc (NIH): 150 mg/dL — ABNORMAL HIGH (ref 0–99)
Triglycerides: 154 mg/dL — ABNORMAL HIGH (ref 0–149)
VLDL Cholesterol Cal: 28 mg/dL (ref 5–40)

## 2021-10-12 LAB — COMPREHENSIVE METABOLIC PANEL
ALT: 45 IU/L — ABNORMAL HIGH (ref 0–44)
AST: 75 IU/L — ABNORMAL HIGH (ref 0–40)
Albumin/Globulin Ratio: 2.9 — ABNORMAL HIGH (ref 1.2–2.2)
Albumin: 3.5 g/dL — ABNORMAL LOW (ref 3.8–4.9)
Alkaline Phosphatase: 31 IU/L — ABNORMAL LOW (ref 44–121)
BUN/Creatinine Ratio: 12 (ref 9–20)
BUN: 19 mg/dL (ref 6–24)
Bilirubin Total: 0.3 mg/dL (ref 0.0–1.2)
CO2: 26 mmol/L (ref 20–29)
Calcium: 8.9 mg/dL (ref 8.7–10.2)
Chloride: 107 mmol/L — ABNORMAL HIGH (ref 96–106)
Creatinine, Ser: 1.63 mg/dL — ABNORMAL HIGH (ref 0.76–1.27)
Globulin, Total: 1.2 g/dL — ABNORMAL LOW (ref 1.5–4.5)
Glucose: 106 mg/dL — ABNORMAL HIGH (ref 70–99)
Potassium: 4.1 mmol/L (ref 3.5–5.2)
Sodium: 146 mmol/L — ABNORMAL HIGH (ref 134–144)
Total Protein: 4.7 g/dL — ABNORMAL LOW (ref 6.0–8.5)
eGFR: 49 mL/min/{1.73_m2} — ABNORMAL LOW (ref >59)

## 2021-10-12 NOTE — Telephone Encounter (Signed)
Pt was called and vm was left, Information has been sent to nurse pool.  Will also send letter out as well. ?

## 2021-10-12 NOTE — Telephone Encounter (Signed)
-----   Message from [name], MD sent at 10/12/2021  5:55 AM EDT ----- ?Let pt know thyroid is not functioning with no circulating thyroid levels  start thyroid pill  return in one month for repeat thyroid blood test , order placed for future ? ?Liver function abnormal, avoid alcohol,   blood counts ok  ?Cholesterol is high follow healthy diet ?

## 2021-10-18 ENCOUNTER — Other Ambulatory Visit: Payer: Self-pay

## 2021-10-19 ENCOUNTER — Ambulatory Visit (HOSPITAL_COMMUNITY)
Admission: RE | Admit: 2021-10-19 | Discharge: 2021-10-19 | Disposition: A | Discharge status: Home / Self Care | Payer: Self-pay | Source: Ambulatory Visit | Attending: Critical Care Medicine | Admitting: Critical Care Medicine

## 2021-10-19 DIAGNOSIS — Z8589 Personal history of malignant neoplasm of other organs and systems: Secondary | ICD-10-CM | POA: Insufficient documentation

## 2021-10-19 DIAGNOSIS — R070 Pain in throat: Secondary | ICD-10-CM | POA: Insufficient documentation

## 2021-10-19 DIAGNOSIS — Z923 Personal history of irradiation: Secondary | ICD-10-CM | POA: Insufficient documentation

## 2021-10-19 MED ORDER — IOHEXOL 300 MG/ML  SOLN
75.0000 mL | Freq: Once | INTRAMUSCULAR | Status: AC | PRN
  Administered 2021-10-19: 75 mL via INTRAVENOUS

## 2021-10-19 MED ORDER — SODIUM CHLORIDE (PF) 0.9 % IJ SOLN
INTRAMUSCULAR | Status: AC
  Filled 2021-10-19: qty 50

## 2021-10-20 ENCOUNTER — Telehealth: Payer: Self-pay

## 2021-10-20 NOTE — Telephone Encounter (Signed)
-----   Message from [name], MD sent at 10/20/2021  9:25 AM EDT ----- ?I tried to call the patient no answer no voicemail please try calling him later he does not have MyChart as well.  Let him know his CT of the neck was completely normal no evidence of recurrence of his head and neck cancer ask him to continue to follow-up with his smoking cessation program no additional follow-up with ENT necessary ?

## 2021-10-20 NOTE — Telephone Encounter (Signed)
Pt was called and is aware of results, DOB was confirmed.  ?

## 2021-10-24 ENCOUNTER — Telehealth: Payer: Self-pay | Admitting: Critical Care Medicine

## 2021-10-24 NOTE — Telephone Encounter (Signed)
Copied from [place] [phone]. Topic: General - Other >> Oct 20, 2021  9:47 AM Tessa Lerner A wrote: Reason for CRM: The patient has returned the missed call from Dr. Joya Gaskins about their CT results  Please contact again when possible

## 2021-10-25 ENCOUNTER — Telehealth: Payer: Self-pay

## 2021-10-25 NOTE — Telephone Encounter (Signed)
Gave pt a call back to go over his ct results ?

## 2021-10-25 NOTE — Telephone Encounter (Signed)
The patient has returned the missed call to review their lab results  ? ?Please contact further when possible  ?

## 2021-10-25 NOTE — Telephone Encounter (Signed)
Called pt and left vm  

## 2021-10-25 NOTE — Telephone Encounter (Signed)
Called pt and he is aware of results from ct  ?

## 2021-11-14 ENCOUNTER — Other Ambulatory Visit: Payer: Self-pay

## 2021-11-21 ENCOUNTER — Other Ambulatory Visit: Payer: Self-pay

## 2021-12-19 NOTE — Progress Notes (Incomplete)
Subjective:    Patient ID: [name], male    DOB: 05/28/1966, 56 y.o.   MRN: [id number]  06/24/19 56 y.o.M here for f/u COPD Homelessnes This patient has [place] and was seen previously in the clinic earlier in November by our physician assistant.  Documentation from that visit is as below.   [name] is a 56 y.o. male here to establish care and as hospital follow up.  He went to ED for med RF.  Homeless.  Out of thyroid meds ~ 1 month before restarting thyroid meds on 05/13/2019.  Denies drinking much alcohol.  C/o paresthesias in fingers and toes not controlled on gabapentin '200mg'$  tid.  Also severe fatigue.  Poor breathing.  Still smoking but trying to cut back.  H/o thyroid CA s/p radiation.     GABA did not help   1. Paresthesias - Thyroid Panel With TSH - gabapentin (NEURONTIN) 300 MG capsule; Take 1 capsule (300 mg total) by mouth 3 (three) times daily.  Dispense: 90 capsule; Refill: 3 - Vitamin B12 - Folate  2. Hypothyroidism due to acquired atrophy of thyroid Will not adjust based on this reading bc he just resumed meds 10/20 - Thyroid Panel With TSH - levothyroxine (SYNTHROID) 175 MCG tablet; Take 1 tablet (175 mcg total) by mouth daily before breakfast. For thyroid hormone replacement  Dispense: 30 tablet; Refill: 2  3. Fatigue, unspecified type  4. Insomnia, unspecified type  - mirtazapine (REMERON) 7.5 MG tablet; Take 1 tablet (7.5 mg total) by mouth at bedtime. For sleep  Dispense: 30 tablet; Refill: 0 - traZODone (DESYREL) 150 MG tablet; Take 1 tablet (150 mg total) by mouth at bedtime as needed for sleep.  Dispense: 30 tablet; Refill: 3  5. Anxiety - hydrOXYzine (ATARAX/VISTARIL) 25 MG tablet; Take 1 tablet (25 mg total) by mouth 3 (three) times daily as needed for anxiety.  Dispense: 60 tablet; Refill: 0  6. Hypertension, unspecified type Not controlled-resume meds - metoprolol succinate (TOPROL-XL) 25 MG 24 hr  tablet; Take 1 tablet (25 mg total) by mouth daily. For high blood pressure  Dispense: 30 tablet; Refill: 2  7. Homeless I am - Ambulatory referral to Social Work     8. Shortness of breath -unclear if he has emphysema or COPD?;  Will add- - Fluticasone-Salmeterol (ADVAIR DISKUS) 250-50 MCG/DOSE AEPB; Inhale 1 puff into the lungs 2 (two) times daily.  Dispense: 1 each; Refill: 3 01/06/20 The patient states the insomnia has been improved the use of Remeron and trazodone.  He states hydroxyzine also has helped the anxiety levels.  He also is taking the metoprolol 25 mg daily and this has been helpful as well.  He is using the Advair as well at 1 puff twice a day and this is helped his breathing.  He has not had a recent chest x-ray.  The patient also resume the levothyroxine and note lab results in the last visit showed still hypothyroidism and part of this was he was out of medication.  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  He has not been seen in follow-up in some time.  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  He also does not  drink alcohol.  He is still smoking however a pack a day of cigarettes  The patient does have very mild dysphagia at this time Note B12 levels did come back low and these were checked to evaluate for paresthesias therefore we will need to r begin B12 supplementation  01/05/2020 Since the last visit in December 2020 the patient continues to smoke a half a pack a day. He is maintaining his blood pressure medications. He has had no further flareups of his lung disease but has run out of his inhalers. The patient's not been emergency room for his breathing since the last visit. He is living in a tent encampment time friend's home in  [place]. He still has some swallowing issues and is edentulous. He states his dentures did not fit him very well. He is able to maintain some nutrition. Note his weight is down to 141 with a BMI of 19.6  He was not able to achieve an ENT appointment to follow-up on prior history of vocal cord cancer status post radiation to the neck. The cancer center stated he would need to see an ENT for evaluation. The patient is requesting 90-day refills on medications. He has been compliant with his Synthroid. He needs follow-up labs on the Synthroid. He is yet to receive his Covid vaccine.  04/07/2020 Patient returns in follow-up for his COPD and hypertension.  He now has a trailer to live down.  He complains of increased shortness of breath and cough.  He is smoking still but less.  He did receive his [place]na vaccine through a homebound vaccine program  07/29/2020 Patient seen return follow-up and notes increased shortness of breath increased cough thick black mucus also notes increased swelling in the left neck increased hoarseness and inability to swallow as effectively.  Prior history of vocal cord cancer status post radiation therapy.  He is also on a dry powdered inhaler Advair which is creating difficulty with his cough and his upper airway.  Note this patient still is homeless living in a tent environment.  He is looking for a Materna booster and he was already in the homebound vaccine program  10/11/21 Patient returns in follow-up has not been seen in over a year.  He no longer lives in a tent he is now living in housing in [place].  He has been out of all his medications for 5 months.  He has severe hypothyroidism from previous radiotherapy to the neck.  He still having difficulty swallowing.  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  He has troubles with swallowing.  He still has no insurance not able to see any  specialist.  He cannot get in with ENT because able cause him to pay out-of-pocket.  He has dry skin with some itching.  He still has paresthesias in the hands and feet.  His PHQ 9 and GAD-7 are extremely elevated.  The patient is not suicidal.  Chest CT scan at the last visit was negative except for emphysema.  Patient is still smoking a half a pack a day of cigarettes.  Blood pressure on arrival 115/84 and has been off metoprolol for 5 months.  5/30   Shortness of Breath This is a chronic problem. The current episode started more than 1 year ago. The problem occurs daily. The problem has been gradually worsening. Associated symptoms include chest pain, rhinorrhea, a sore throat, sputum production and wheezing. Pertinent  negatives include no ear pain, fever, headaches, hemoptysis, orthopnea, PND or vomiting. The symptoms are aggravated by smoke, odors, exercise and any activity. Associated symptoms comments: Mucus is clear  Voice gone Sore throat. Risk factors include smoking. He has tried beta agonist inhalers and steroid inhalers for the symptoms. The treatment provided mild relief. His past medical history is significant for COPD. There is no history of asthma, bronchiolitis, CAD, DVT, PE or pneumonia.  Past Medical History:  Diagnosis Date  . COPD (chronic obstructive pulmonary disease) ([place])   . Headache(784.0)   . Heart murmur    child  . Hx of radiation therapy 05/19/13- 06/27/13   glottic larynx, right true vocal cord 6300 cGy 28 sessions  . Hypertension   . Hypothyroidism   . Influenza B 08/01/2018  . Long term (current) use of opiate analgesic 10/11/2016  . Polysubstance abuse ([place])   . Polysubstance dependence including opioid type drug, episodic abuse, with delirium ([place]) 03/22/2017  . Shortness of breath   . 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 Vocal cord cancer ([place]) 05/01/2013     Family History  Problem Relation Age of Onset  . COPD Mother   . Emphysema  Mother   . Heart attack Father      Social History   Socioeconomic History  . Marital status: Legally Separated    Spouse name: Not on file  . Number of children: 3  . Years of education: Not on file  . 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 Smoking status: Every Day    Packs/day: 1.00    Years: 34.00    Pack years: 34.00    Types: Cigarettes  . Smokeless tobacco: Never  . Tobacco comments:    DOWN TO 4 - 5 CIGARETTES A DAY  Vaping Use  . Vaping Use: Never used  Substance and Sexual Activity  . Alcohol use: No    Alcohol/week: 0.0 standard drinks  . Drug use: Not Currently    Types: Marijuana, Cocaine, Heroin    Comment: He hasn't used drugs in "over a year"- 06/27/19  . Sexual activity: Yes    Birth control/protection: Condom  Other Topics Concern  . Not on file  Social History Narrative   The patient is married. Patient has 3 children. 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   The patient has a history of smoking 2 packs of cigarette a day for 34 years. Patient current is trying to quit smoking. Patient denies use of alcohol. Patient denies use of other illicit drugs.   Social Determinants of Health   Financial Resource Strain: Not on file  Food Insecurity: Not on file  Transportation Needs: Not on file  Physical Activity: Not on file  Stress: Not on file  Social Connections: Not on file  Intimate Partner Violence: Not on file     Allergies  Allergen Reactions  . Fentanyl Nausea And Vomiting  . Citalopram Other (See Comments)    Dizziness and shakiness.  . Morphine And Related Nausea And Vomiting     Outpatient Medications Prior to Visit  Medication Sig Dispense Refill  . albuterol (VENTOLIN HFA) 108 (90 Base) MCG/ACT inhaler INHALE 2 PUFFS INTO THE LUNGS EVERY 4 (FOUR) HOURS AS NEEDED FOR WHEEZING OR SHORTNESS OF BREATH. 18 g 1  . budesonide-formoterol  (SYMBICORT) 80-4.5 MCG/ACT inhaler Inhale  2 puffs into the lungs 2 (two) times daily. 10.2 g 3  . fluticasone (FLONASE) 50 MCG/ACT nasal spray Place 2 sprays into both nostrils daily. 16 g 6  . gabapentin (NEURONTIN) 300 MG capsule TAKE 1 CAPSULE (300 MG TOTAL) BY MOUTH 3 (THREE) TIMES DAILY. 180 capsule 3  . levothyroxine (SYNTHROID) 175 MCG tablet TAKE 1 TABLET (175 MCG TOTAL) BY MOUTH DAILY BEFORE BREAKFAST. FOR THYROID HORMONE REPLACEMENT 90 tablet 2  . mirtazapine (REMERON) 7.5 MG tablet TAKE 1 TABLET (7.5 MG TOTAL) BY MOUTH AT BEDTIME. 30 tablet 1  . sertraline (ZOLOFT) 50 MG tablet Take 1 tablet (50 mg total) by mouth daily. 30 tablet 3  . vitamin B-12 (CYANOCOBALAMIN) 1000 MCG tablet Take 1 tablet (1,000 mcg total) by mouth daily. 90 tablet 3   No facility-administered medications prior to visit.     Review of Systems  Constitutional:  Negative for fever.  HENT:  Positive for hearing loss, rhinorrhea, sore throat, trouble swallowing and voice change. Negative for ear pain.        Has fullness in the ears decreased hearing  Respiratory:  Positive for sputum production, choking, chest tightness, shortness of breath and wheezing. Negative for hemoptysis.        Throat pain  Cardiovascular:  Positive for chest pain. Negative for orthopnea and PND.  Gastrointestinal:  Negative for vomiting.       Gerd  Endocrine: Positive for polyuria.  Genitourinary: Negative.   Neurological:  Negative for headaches.  Psychiatric/Behavioral:  Positive for dysphoric mood. Negative for self-injury and suicidal ideas.       Objective:   Physical Exam Vitals reviewed.  Constitutional:      Appearance: Normal appearance. He is well-developed. He is not diaphoretic.     Comments: Thin  HENT:     Head: Normocephalic and atraumatic.     Right Ear: Tympanic membrane, ear canal and external ear normal. There is no impacted cerumen.     Left Ear: Tympanic membrane, ear canal and external ear normal.  There is no impacted cerumen.     Nose: Congestion and rhinorrhea present. No nasal deformity, septal deviation or mucosal edema.     Right Sinus: No maxillary sinus tenderness or frontal sinus tenderness.     Left Sinus: No maxillary sinus tenderness or frontal sinus tenderness.     Mouth/Throat:     Mouth: Mucous membranes are moist.     Pharynx: Oropharynx is clear. No oropharyngeal exudate or posterior oropharyngeal erythema.  Eyes:     General: No scleral icterus.    Conjunctiva/sclera: Conjunctivae normal.     Pupils: Pupils are equal, round, and reactive to light.  Neck:     Thyroid: No thyromegaly.     Vascular: No carotid bruit or JVD.     Trachea: Trachea normal. No tracheal tenderness or tracheal deviation.  Cardiovascular:     Rate and Rhythm: Normal rate and regular rhythm.     Chest Wall: PMI is not displaced.     Pulses: Normal pulses. No decreased pulses.     Heart sounds: Normal heart sounds, S1 normal and S2 normal. Heart sounds not distant. No murmur heard. No systolic murmur is present.  No diastolic murmur is present.    No friction rub. No gallop. No S3 or S4 sounds.  Pulmonary:     Effort: Pulmonary effort is normal. No tachypnea, accessory muscle usage or respiratory distress.     Breath sounds: Normal breath sounds. No stridor. No  decreased breath sounds, wheezing, rhonchi or rales.  Chest:     Chest wall: No tenderness.  Abdominal:     General: Bowel sounds are normal. There is no distension.     Palpations: Abdomen is soft. Abdomen is not rigid.     Tenderness: There is no abdominal tenderness. There is no guarding or rebound.  Musculoskeletal:        General: Normal range of motion.     Cervical back: Normal range of motion and neck supple. No edema, erythema or rigidity. No muscular tenderness. Normal range of motion.  Lymphadenopathy:     Head:     Right side of head: No submental or submandibular adenopathy.     Left side of head: No submental or  submandibular adenopathy.     Cervical: No cervical adenopathy.  Skin:    General: Skin is warm and dry.     Coloration: Skin is not pale.     Findings: No erythema or rash.     Nails: There is no clubbing.     Comments: Decrease moisturization of skin  Neurological:     Mental Status: He is alert and oriented to person, place, and time.     Sensory: No sensory deficit.  Psychiatric:        Attention and Perception: Attention and perception normal.        Mood and Affect: Affect normal. Mood is depressed.        Speech: Speech normal.        Behavior: Behavior normal. Behavior is cooperative.        Thought Content: Thought content does not include homicidal or suicidal ideation. Thought content does not include homicidal or suicidal plan.        Cognition and Memory: Cognition and memory normal.        Judgment: Judgment normal.    There were no vitals taken for this visit.  Gen: Pleasant, thin, in no distress,  normal affect  ENT: No lesions,  mouth clear,  oropharynx clear, no postnasal drip, edentulous  Neck: No JVD, no TMG, no carotid bruits, there is a left-sided firm neck mass at the end of the thyroid cartilage  Lungs: No use of accessory muscles, no dullness to percussion, distant breath sounds, few expired wheezes no rhonchi hyperresonance to percussion Cardiovascular: RRR, heart sounds normal, no murmur or gallops, no peripheral edema  Abdomen: soft and NT, no HSM,  BS normal  Musculoskeletal: No deformities, no cyanosis or clubbing  Neuro: alert, non focal  Skin: Warm, no lesions or rashes  Lab Results  Component Value Date   HGBA1C 6.0 (A) 07/29/2020        Assessment & Plan:  I personally reviewed all images and lab data in the [facility] system as well as any outside material available during this office visit and agree with the  radiology impressions.   No problem-specific Assessment & Plan notes found for this encounter.   There are no diagnoses linked to  this encounter.

## 2021-12-20 ENCOUNTER — Ambulatory Visit: Payer: Self-pay | Admitting: Critical Care Medicine

## 2022-01-01 NOTE — Progress Notes (Deleted)
   Established Patient Office Visit  Subjective   Patient ID: [name], male    DOB: [birth year]  Age: 56 y.o. MRN: [id number]  No chief complaint on file.   HPI  {History (Optional):23778}  ROS    Objective:     There were no vitals taken for this visit. {Vitals History (Optional):23777}  Physical Exam   No results found for any visits on 01/02/22.  {Labs (Optional):23779}  The 10-year ASCVD risk score (Arnett DK, et al., 2019) is: 11.8%    Assessment & Plan:   Problem List Items Addressed This Visit   None   No follow-ups on file.    Asencion Noble, MD

## 2022-01-02 ENCOUNTER — Ambulatory Visit: Payer: Self-pay | Admitting: Critical Care Medicine

## 2022-03-09 ENCOUNTER — Ambulatory Visit: Payer: Self-pay | Admitting: Critical Care Medicine

## 2022-04-03 ENCOUNTER — Other Ambulatory Visit: Payer: Self-pay

## 2022-11-27 ENCOUNTER — Emergency Department (HOSPITAL_COMMUNITY): Payer: Self-pay

## 2022-11-27 ENCOUNTER — Other Ambulatory Visit: Payer: Self-pay

## 2022-11-27 ENCOUNTER — Emergency Department (HOSPITAL_COMMUNITY)
Admission: EM | Admit: 2022-11-27 | Discharge: 2022-11-27 | Disposition: A | Discharge status: Home / Self Care | Payer: Self-pay | Attending: Student | Admitting: Student

## 2022-11-27 ENCOUNTER — Encounter (HOSPITAL_COMMUNITY): Payer: Self-pay

## 2022-11-27 DIAGNOSIS — R1030 Lower abdominal pain, unspecified: Secondary | ICD-10-CM | POA: Insufficient documentation

## 2022-11-27 DIAGNOSIS — E039 Hypothyroidism, unspecified: Secondary | ICD-10-CM | POA: Insufficient documentation

## 2022-11-27 DIAGNOSIS — C44310 Basal cell carcinoma of skin of unspecified parts of face: Secondary | ICD-10-CM

## 2022-11-27 DIAGNOSIS — R2243 Localized swelling, mass and lump, lower limb, bilateral: Secondary | ICD-10-CM | POA: Insufficient documentation

## 2022-11-27 DIAGNOSIS — C44311 Basal cell carcinoma of skin of nose: Secondary | ICD-10-CM | POA: Insufficient documentation

## 2022-11-27 DIAGNOSIS — I10 Essential (primary) hypertension: Secondary | ICD-10-CM | POA: Insufficient documentation

## 2022-11-27 DIAGNOSIS — J449 Chronic obstructive pulmonary disease, unspecified: Secondary | ICD-10-CM | POA: Insufficient documentation

## 2022-11-27 LAB — URINALYSIS, ROUTINE W REFLEX MICROSCOPIC
Bilirubin Urine: NEGATIVE
Glucose, UA: NEGATIVE mg/dL
Hgb urine dipstick: NEGATIVE
Ketones, ur: NEGATIVE mg/dL
Leukocytes,Ua: NEGATIVE
Nitrite: NEGATIVE
Protein, ur: NEGATIVE mg/dL
Specific Gravity, Urine: 1.018 (ref 1.005–1.030)
pH: 6 (ref 5.0–8.0)

## 2022-11-27 LAB — COMPREHENSIVE METABOLIC PANEL
ALT: 21 U/L (ref 0–44)
AST: 44 U/L — ABNORMAL HIGH (ref 15–41)
Albumin: 4 g/dL (ref 3.5–5.0)
Alkaline Phosphatase: 39 U/L (ref 38–126)
Anion gap: 9 (ref 5–15)
BUN: 16 mg/dL (ref 6–20)
CO2: 24 mmol/L (ref 22–32)
Calcium: 9.3 mg/dL (ref 8.9–10.3)
Chloride: 106 mmol/L (ref 98–111)
Creatinine, Ser: 1.63 mg/dL — ABNORMAL HIGH (ref 0.61–1.24)
GFR, Estimated: 49 mL/min — ABNORMAL LOW (ref >60)
Glucose, Bld: 106 mg/dL — ABNORMAL HIGH (ref 70–99)
Potassium: 3.6 mmol/L (ref 3.5–5.1)
Sodium: 139 mmol/L (ref 135–145)
Total Bilirubin: 0.7 mg/dL (ref 0.3–1.2)
Total Protein: 6.6 g/dL (ref 6.5–8.1)

## 2022-11-27 LAB — CBC WITH DIFFERENTIAL/PLATELET
Abs Immature Granulocytes: 0.01 10*3/uL (ref 0.00–0.07)
Basophils Absolute: 0 10*3/uL (ref 0.0–0.1)
Basophils Relative: 1 %
Eosinophils Absolute: 0.4 10*3/uL (ref 0.0–0.5)
Eosinophils Relative: 11 %
HCT: 35.9 % — ABNORMAL LOW (ref 39.0–52.0)
Hemoglobin: 12.1 g/dL — ABNORMAL LOW (ref 13.0–17.0)
Immature Granulocytes: 0 %
Lymphocytes Relative: 29 %
Lymphs Abs: 1.2 10*3/uL (ref 0.7–4.0)
MCH: 34.7 pg — ABNORMAL HIGH (ref 26.0–34.0)
MCHC: 33.7 g/dL (ref 30.0–36.0)
MCV: 102.9 fL — ABNORMAL HIGH (ref 80.0–100.0)
Monocytes Absolute: 0.2 10*3/uL (ref 0.1–1.0)
Monocytes Relative: 5 %
Neutro Abs: 2.3 10*3/uL (ref 1.7–7.7)
Neutrophils Relative %: 54 %
Platelets: 176 10*3/uL (ref 150–400)
RBC: 3.49 MIL/uL — ABNORMAL LOW (ref 4.22–5.81)
RDW: 14.8 % (ref 11.5–15.5)
WBC: 4.2 10*3/uL (ref 4.0–10.5)
nRBC: 0 % (ref 0.0–0.2)

## 2022-11-27 LAB — BRAIN NATRIURETIC PEPTIDE: B Natriuretic Peptide: 12.5 pg/mL (ref 0.0–100.0)

## 2022-11-27 LAB — TSH: TSH: 77.218 u[IU]/mL — ABNORMAL HIGH (ref 0.350–4.500)

## 2022-11-27 LAB — LIPASE, BLOOD: Lipase: 35 U/L (ref 11–51)

## 2022-11-27 LAB — T4, FREE: Free T4: <0.25 ng/dL — ABNORMAL LOW (ref 0.61–1.12)

## 2022-11-27 MED ORDER — IOHEXOL 350 MG/ML SOLN
75.0000 mL | Freq: Once | INTRAVENOUS | Status: AC | PRN
  Administered 2022-11-27: 75 mL via INTRAVENOUS

## 2022-11-27 MED ORDER — NAPROXEN 500 MG PO TABS
500.0000 mg | ORAL_TABLET | Freq: Two times a day (BID) | ORAL | 0 refills | Status: DC | PRN
  Filled 2022-11-27: qty 30, 15d supply, fill #0

## 2022-11-27 NOTE — ED Provider Triage Note (Signed)
Emergency Medicine Provider Triage Evaluation Note  [name] , a 57 y.o. male  was evaluated in triage.  Pt complains of concerns for generalized swelling x 2 weeks.  Also notes right-sided groin/testicular pain that started last night.  Has associated shortness of breath with exertion.  History of vocal cord cancer that was treated with his last radiation treatment being in 2015 with Dr. Basilio Cairo.  Notes his voice feels more muffled over the past 2 weeks.  Denies chest pain.  Has associated epigastric pain.  Has a history of GERD.  Notes that he has nebulizer and inhalers however he has been out of them for several months at this time.  Has a past medical history of COPD however denies history of CHF.   Review of Systems  Positive:  Negative:   Physical Exam  BP (!) 147/97   Pulse 82   Temp 97.7 F (36.5 C) (Oral)   Resp 16   Ht 5\' 11"  (1.803 m)   Wt 68 kg   SpO2 100%   BMI 20.92 kg/m  Gen:   Awake, no distress   Resp:  Normal effort  MSK:   Moves extremities without difficulty  Other:    Medical Decision Making  Medically screening exam initiated at 9:30 AM.  Appropriate orders placed.  [name] was informed that the remainder of the evaluation will be completed by another provider, this initial triage assessment does not replace that evaluation, and the importance of remaining in the ED until their evaluation is complete.  Workup initiated   [name] A, PA-C 11/27/22 [phone]

## 2022-11-27 NOTE — ED Provider Notes (Signed)
[place] EMERGENCY DEPARTMENT AT [facility] Provider Note  CSN: [id number] Arrival date & time: 11/27/22 1478  Chief Complaint(s) Groin Pain and Edema  HPI [name] is a 57 y.o. 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  Patient states that over the last 2 weeks he has had intermittent episodes primarily at night of severe right testicular pain that will wake him from sleep.  He also endorses generalized upper and lower extremity edema with swelling under the orbits over the last 2 weeks.  Endorses some mild shortness of breath on exertion.  He states that he has a baseline level of hoarse voice secondary to radiation for head neck cancer but feels that it is worsening slightly over the last 2 weeks as well.  He states that insurance issues have prevented him from following up with his ENT and he has since discontinued all of his home medications.  He denies chest pain, abdominal pain, nausea, vomiting, fever or other systemic symptoms.   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   Vocal cord cancer (HCC) 05/01/2013   Patient Active Problem List   Diagnosis Date Noted   History of primary laryngeal cancer 08/27/2020    Prediabetes 07/29/2020   Neck mass 07/29/2020   COPD with chronic bronchitis 06/24/2019   Hypertension 06/24/2019   Anxiety 06/24/2019   Paresthesias 06/24/2019   Insomnia 06/24/2019   Major depressive disorder, recurrent episode, severe (HCC) 08/14/2018   Homelessness 10/25/2016   Emphysema of lung (HCC) 12/18/2014   Esophageal dysphagia 09/10/2014   Tobacco use disorder 09/10/2014   Hypothyroidism due to acquired atrophy of thyroid 06/12/2014   GERD (gastroesophageal reflux disease) 06/12/2014   Hx of radiation therapy    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 05/06/2013   Hoarseness of voice 03/17/2013   Home Medication(s) Prior to Admission medications   Medication Sig Start Date End Date Taking? Authorizing Provider  albuterol (VENTOLIN HFA) 108 (90 Base) MCG/ACT inhaler INHALE 2 PUFFS INTO THE LUNGS EVERY 4 (FOUR) HOURS AS NEEDED FOR WHEEZING OR SHORTNESS OF BREATH. 10/11/21 10/11/22  Storm Frisk, MD  budesonide-formoterol (SYMBICORT) 80-4.5 MCG/ACT inhaler Inhale 2 puffs into the lungs 2 (two) times daily. 10/11/21   Storm Frisk, MD  fluticasone (FLONASE) 50 MCG/ACT nasal spray Place 2 sprays into both nostrils daily. 10/11/21   Storm Frisk, MD  gabapentin (NEURONTIN) 300 MG capsule TAKE 1 CAPSULE (300 MG TOTAL) BY MOUTH 3 (THREE) TIMES DAILY. 10/11/21 10/11/22  Storm Frisk, MD  levothyroxine (SYNTHROID) 175 MCG tablet TAKE 1 TABLET (175 MCG TOTAL) BY MOUTH DAILY BEFORE BREAKFAST. FOR THYROID HORMONE REPLACEMENT 10/11/21 10/11/22  Storm Frisk, MD  mirtazapine (REMERON) 7.5 MG tablet  TAKE 1 TABLET (7.5 MG TOTAL) BY MOUTH AT BEDTIME. 10/11/21 10/11/22  Storm Frisk, MD  sertraline (ZOLOFT) 50 MG tablet Take 1 tablet (50 mg total) by mouth daily. 10/11/21   Storm Frisk, MD  vitamin B-12 (CYANOCOBALAMIN) 1000 MCG tablet Take 1 tablet (1,000 mcg total) by mouth daily. 07/29/20   Storm Frisk, MD                                                                                                                                     Past Surgical History Past Surgical History:  Procedure Laterality Date   BALLOON DILATION N/A 12/30/2013   Procedure: BALLOON DILATION;  Surgeon: Charolett Bumpers, MD;  Location: [facility];  Service: Endoscopy;  Laterality: N/A;   DIRECT LARYNGOSCOPY Right 04/24/2013   Procedure: DIRECT LARYNGOSCOPY and BIOPSY OF TONGUE;  Surgeon: Osborn Coho, MD;  Location: [facility] OR;  Service: ENT;  Laterality: Right;   ESOPHAGEAL MANOMETRY N/A 09/07/2014   Procedure: ESOPHAGEAL MANOMETRY (EM);  Surgeon: Charolett Bumpers, MD;  Location: [facility];  Service: Endoscopy;  Laterality: N/A;   ESOPHAGOGASTRODUODENOSCOPY (EGD) WITH PROPOFOL N/A 12/30/2013   Procedure: ESOPHAGOGASTRODUODENOSCOPY (EGD) WITH PROPOFOL;  Surgeon: Charolett Bumpers, MD;  Location: [facility];  Service: Endoscopy;  Laterality: N/A;   MICROLARYNGOSCOPY Right 04/24/2013   Procedure: MICROLARYNGOSCOPY and RIGHT VOCAL CORD BIOPSY;  Surgeon: Osborn Coho, MD;  Location: [facility] OR;  Service: ENT;  Laterality: Right;   MULTIPLE EXTRACTIONS WITH ALVEOLOPLASTY N/A 09/30/2013   Procedure: Extraction of tooth #'s 2,3,4,5,6,7,8,9,10,11,12,15,22,23,24,25,26,27,28 with alveoloplasty;  Surgeon: Charlynne Pander, DDS;  Location: [facility] ORS;  Service: Oral Surgery;  Laterality: N/A;   TONSILLECTOMY     as a child   [place] teeth     age 50   Family History Family History  Problem Relation Age of Onset   COPD Mother    Emphysema Mother    Heart attack Father     Social History Social History   Tobacco Use   Smoking status: Every Day    Packs/day: 1.00    Years: 34.00    Additional pack years: 0.00    Total pack years: 34.00    Types: Cigarettes   Smokeless tobacco: Never   Tobacco comments:    DOWN TO 4 - 5 CIGARETTES A DAY  Vaping Use   Vaping Use: Never used  Substance Use Topics   Alcohol use: No    Alcohol/week: 0.0 standard drinks  of alcohol   Drug use: Not Currently    Types: Marijuana, Cocaine, Heroin    Comment: He hasn't used drugs in "over a year"- 06/27/19   Allergies Fentanyl, Citalopram, and Morphine and related  Review of Systems Review of Systems  HENT:  Positive for voice change.   Respiratory:  Positive for shortness of breath.   Cardiovascular:  Positive for leg swelling.  Genitourinary:  Positive for testicular pain.  Physical Exam Vital Signs  I have reviewed the triage vital signs BP (!) 147/97   Pulse 82   Temp 97.7 F (36.5 C) (Oral)   Resp 16   Ht 5\' 11"  (1.803 m)   Wt 68 kg   SpO2 100%   BMI 20.92 kg/m   Physical Exam Constitutional:      General: He is not in acute distress.    Appearance: Normal appearance.  HENT:     Head: Normocephalic and atraumatic.     Comments: Swelling under bilateral orbits, basal cell carcinoma on the right lateral nasal fold    Nose: No congestion or rhinorrhea.  Eyes:     General:        Right eye: No discharge.        Left eye: No discharge.     Extraocular Movements: Extraocular movements intact.     Pupils: Pupils are equal, round, and reactive to light.  Cardiovascular:     Rate and Rhythm: Normal rate and regular rhythm.     Heart sounds: No murmur heard. Pulmonary:     Effort: No respiratory distress.     Breath sounds: No wheezing or rales.  Abdominal:     General: There is no distension.     Tenderness: There is no abdominal tenderness.  Musculoskeletal:        General: Normal range of motion.     Cervical back: Normal range of motion.     Right lower leg: Edema present.     Left lower leg: Edema present.  Skin:    General: Skin is warm and dry.  Neurological:     General: No focal deficit present.     Mental Status: He is alert.     ED Results and Treatments Labs (all labs ordered are listed, but only abnormal results are displayed) Labs Reviewed  CBC WITH DIFFERENTIAL/PLATELET - Abnormal; Notable for the following  components:      Result Value   RBC 3.49 (*)    Hemoglobin 12.1 (*)    HCT 35.9 (*)    MCV 102.9 (*)    MCH 34.7 (*)    All other components within normal limits  COMPREHENSIVE METABOLIC PANEL - Abnormal; Notable for the following components:   Glucose, Bld 106 (*)    Creatinine, Ser 1.63 (*)    AST 44 (*)    GFR, Estimated 49 (*)    All other components within normal limits  BRAIN NATRIURETIC PEPTIDE  LIPASE, BLOOD                                                                                                                          Radiology DG Chest 2 View  Result Date: 11/27/2022 CLINICAL DATA:  Shortness of breath. EXAM: CHEST - 2 VIEW COMPARISON:  June 24, 2019. FINDINGS: The heart size and mediastinal contours are within normal limits. Stable minimal right apical scarring. Minimal right basilar subsegmental atelectasis. Minimal left pleural  effusion. The visualized skeletal structures are unremarkable. IMPRESSION: Minimal right basilar subsegmental atelectasis. Minimal left pleural effusion. Electronically Signed   By: Lupita Raider M.D.   On: 11/27/2022 10:42   US SCROTUM W/DOPPLER  Result Date: 11/27/2022 CLINICAL DATA:  Right testicle pain EXAM: SCROTAL ULTRASOUND DOPPLER ULTRASOUND OF THE TESTICLES TECHNIQUE: Complete ultrasound examination of the testicles, epididymis, and other scrotal structures was performed. Color and spectral Doppler ultrasound were also utilized to evaluate blood flow to the testicles. COMPARISON:  None Available. FINDINGS: Right testicle Measurements: 4.5 x 2.1 x 2.7 cm. No mass or microlithiasis visualized. Left testicle Measurements: 4.5 x 2.1 x 3.0 cm. No mass identified. 3 mm echogenic structure with posterior acoustic shadowing is identified within the periphery of the left testis. Right epididymis:  Normal in size and appearance. Left epididymis:  Normal in size and appearance. Hydrocele:  None visualized. Varicocele:  None visualized. Pulsed  Doppler interrogation of both testes demonstrates normal low resistance arterial and venous waveforms bilaterally. IMPRESSION: 1. No evidence for testicular mass or torsion. 2. 3 mm left testicular calcification. Current literature suggests that testicular microlithiasis is not a significant independent risk factor for development of testicular carcinoma, and that follow up imaging is not warranted in the absence of other risk factors. Monthly testicular self-examination and annual physical exams are considered appropriate surveillance. If patient has other risk factors for testicular carcinoma, then referral to Urology should be considered. (Reference: DeCastro, et al.: A 5-Year Follow up Study of Asymptomatic Men with Testicular Microlithiasis. J Urol 2008; 179:1420-1423.) Electronically Signed   By: Signa Kell M.D.   On: 11/27/2022 10:42    Pertinent labs & imaging results that were available during my care of the patient were reviewed by me and considered in my medical decision making (see MDM for details).  Medications Ordered in ED Medications - No data to display                                                                                                                                   Procedures Procedures  (including critical care time)  Medical Decision Making / ED Course   This patient presents to the ED for concern of testicular pain, swelling, hoarse voice, shortness of breath, this involves an extensive number of treatment options, and is a complaint that carries with it a high risk of complications and morbidity.  The differential diagnosis includes recurrence of malignancy, vocal cord paralysis, basal cell carcinoma, CHF exacerbation, nephrotic syndrome, hypothyroidism  MDM: Patient seen emergency room for evaluation of multiple complaints as described above.  Physical exam with bilateral swelling under the orbits, a small basal cell carcinoma over the right nasolabial  fold, minimal pitting edema to lower extremities.  Testicular exam largely unremarkable.  Laboratory evaluation largely unremarkable with normal BNP, no proteinuria in urine.  Due to concern for possible recurrence of underlying malignancy, patient received extensive CT imaging  including CT soft tissue neck, CT chest abdomen pelvis which reassuringly did not show any evidence of malignancy.  The vocal cords were opposed at the time of the imaging which limits complete rule out for head neck cancer recurrence but there is no obvious evidence of recurrence at this time with no pathologic lymphadenopathy in the neck.  No convincing evidence of bilateral vocal cord paralysis at this time as the patient is not in respiratory distress and does not have stridor.  Testicular ultrasound shows a 3 mm left testicular calcification and I did speak with Dr. Mena Goes of urology to ensure that this lesion would not cause intermittent torsion.  Dr. Mena Goes states that this is not a risk factor for intermittent torsion of the testicle and patient's pain is likely referred from his back.  Patient does state that he has had a lot of issues with his back recently and we will start the patient on an NSAID regimen for this.  At time of signout, patient pending thyroid studies.  There is no vital sign abnormalities or convincing physical exam evidence of significant myxedema today and patient likely will be safe for discharge with outpatient follow-up regardless but he will likely need to restart his Synthroid and reach out to his primary care physician to do this safely.  I placed a TOC consult to help patient from insurance standpoint.  Please see provider signout for continuation of workup.    Additional history obtained: -Additional history obtained from.  Sister -External records from outside source obtained and reviewed including: Chart review including previous notes, labs, imaging, consultation notes   Lab Tests: -I  ordered, reviewed, and interpreted labs.   The pertinent results include:   Labs Reviewed  CBC WITH DIFFERENTIAL/PLATELET - Abnormal; Notable for the following components:      Result Value   RBC 3.49 (*)    Hemoglobin 12.1 (*)    HCT 35.9 (*)    MCV 102.9 (*)    MCH 34.7 (*)    All other components within normal limits  COMPREHENSIVE METABOLIC PANEL - Abnormal; Notable for the following components:   Glucose, Bld 106 (*)    Creatinine, Ser 1.63 (*)    AST 44 (*)    GFR, Estimated 49 (*)    All other components within normal limits  BRAIN NATRIURETIC PEPTIDE  LIPASE, BLOOD      EKG   EKG Interpretation  Date/Time:  Monday Nov 27 2022 13:28:44 EDT Ventricular Rate:  72 PR Interval:  182 QRS Duration: 84 QT Interval:  376 QTC Calculation: 411 R Axis:   66 Text Interpretation: Normal sinus rhythm When compared with ECG of 01-Aug-2018 15:30, PREVIOUS ECG IS PRESENT Confirmed by Kamisha Ell ([id number]) on 11/27/2022 2:02:50 PM         Imaging Studies ordered: I ordered imaging studies including testicular ultrasound, chest x-ray, CT soft tissue neck, chest abdomen pelvis I independently visualized and interpreted imaging. I agree with the radiologist interpretation   Medicines ordered and prescription drug management: No orders of the defined types were placed in this encounter.   -I have reviewed the patients home medicines and have made adjustments as needed  Critical interventions none  Consultations Obtained: I requested consultation with the urologist on-call Dr. Mena Goes,  and discussed lab and imaging findings as well as pertinent plan - they recommend: NSAID regimen for back pain   Cardiac Monitoring: The patient was maintained on a cardiac monitor.  I personally viewed and interpreted the  cardiac monitored which showed an underlying rhythm of: NSR  Social Determinants of Health:  Factors impacting patients care include: Insurance issue interfering with ENT  follow-up   Reevaluation: After the interventions noted above, I reevaluated the patient and found that they have :improved  Co morbidities that complicate the patient evaluation  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  Please see provider signout for continuation of workup.  Anticipate discharge     Final Clinical Impression(s) / ED Diagnoses Final diagnoses:  None     @PCDICTATION @    Glendora Score, MD 11/28/22 1221

## 2022-11-27 NOTE — Care Management (Addendum)
Transition of Care [facility]) - Emergency Department Mini Assessment   Patient Details  Name: [name] MRN: [id number] Date of Birth: [birth year]  [facility]) CM/SW Contact:    Lavenia Atlas, RN Phone Number: 11/27/2022, 4:22 PM   Clinical Narrative: Patient presented [facility] ED for complaint of generalized swelling x2 weeks and right sided groin/testicle pain. Received TOC consult for medical insurance assistance/ referral to ENT. This RNCM spoke with patient who reports he previously saw [facility] last year and has not followed up recently. [facility] next available appt was 03/15/23.  This RNCM scheduled PCP establishment appt on 12/01/22 at 9:20am with [facility], attached to [facility]. Patient reports he can afford Naproxen at discharge.  Patient reports his daughter will transport at discharge   No additional TOC needs.     ED Mini Assessment: What brought you to the Emergency Department? : complaint of generalized swelling x2 weeks and right sided groin/testicle pain, shortness of breathe  Barriers to Discharge: No Barriers Identified  Barrier interventions: scheduled PCP appointment for ENT referral  Means of departure: Car  Interventions which prevented an admission or readmission: PCP Appointment Scheduled    Patient Contact and Communications        ,            CMS Medicare.gov Compare Post Acute Care list provided to:: Patient Choice offered to / list presented to : Patient  Admission diagnosis:  GROIN PAIN Patient Active Problem List   Diagnosis Date Noted   History of primary laryngeal cancer 08/27/2020   Prediabetes 07/29/2020   Neck mass 07/29/2020   COPD with chronic bronchitis 06/24/2019   Hypertension 06/24/2019   Anxiety 06/24/2019   Paresthesias 06/24/2019   Insomnia 06/24/2019   Major depressive disorder, recurrent episode, severe (HCC) 08/14/2018   Homelessness 10/25/2016   Emphysema of  lung (HCC) 12/18/2014   Esophageal dysphagia 09/10/2014   Tobacco use disorder 09/10/2014   Hypothyroidism due to acquired atrophy of thyroid 06/12/2014   GERD (gastroesophageal reflux disease) 06/12/2014   Hx of radiation therapy    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 05/06/2013   Hoarseness of voice 03/17/2013   PCP:  Storm Frisk, MD Pharmacy:   [facility] [address], [address] [place] [address] Phone: [phone] Fax: [phone]  [facility] [address] [place]), [place] - [address] [place] [address] Phone: [phone] Fax: [phone]

## 2022-11-27 NOTE — Discharge Instructions (Addendum)
Your TSH was elevated indicating hypothyroidism.  Recommend you resume your home Synthroid.  Follow-up with your PCP for further management outpatient.  Scrotal US Results: IMPRESSION:  1. No evidence for testicular mass or torsion.  2. 3 mm left testicular calcification. Current literature suggests  that testicular microlithiasis is not a significant independent risk  factor for development of testicular carcinoma, and that follow up  imaging is not warranted in the absence of other risk factors.  Monthly testicular self-examination and annual physical exams are  considered appropriate surveillance. If patient has other risk  factors for testicular carcinoma, then referral to Urology should be  considered. (Reference: DeCastro, et al.: A 5-Year Follow up Study  of Asymptomatic Men with Testicular Microlithiasis. J Urol 2008;  179:1420-1423.)

## 2022-11-27 NOTE — ED Triage Notes (Signed)
Pt to ED with complaint of generalized swelling x2 weeks and right sided groin/testicle pain that began last evening. +SHOB with exertion.   Hx of vocal cord CA - reports voice has been more muffled over last 2 weeks as well.

## 2022-11-30 ENCOUNTER — Other Ambulatory Visit: Payer: Self-pay

## 2022-12-01 ENCOUNTER — Encounter: Payer: Self-pay | Admitting: Nurse Practitioner

## 2022-12-01 ENCOUNTER — Other Ambulatory Visit: Payer: Self-pay

## 2022-12-01 ENCOUNTER — Ambulatory Visit (INDEPENDENT_AMBULATORY_CARE_PROVIDER_SITE_OTHER): Payer: Self-pay | Admitting: Nurse Practitioner

## 2022-12-01 VITALS — BP 123/79 | HR 88 | Temp 97.3°F | Wt 171.6 lb

## 2022-12-01 DIAGNOSIS — J4489 Other specified chronic obstructive pulmonary disease: Secondary | ICD-10-CM

## 2022-12-01 DIAGNOSIS — F17200 Nicotine dependence, unspecified, uncomplicated: Secondary | ICD-10-CM

## 2022-12-01 DIAGNOSIS — R1031 Right lower quadrant pain: Secondary | ICD-10-CM | POA: Insufficient documentation

## 2022-12-01 DIAGNOSIS — Z8589 Personal history of malignant neoplasm of other organs and systems: Secondary | ICD-10-CM

## 2022-12-01 DIAGNOSIS — F32A Depression, unspecified: Secondary | ICD-10-CM | POA: Insufficient documentation

## 2022-12-01 DIAGNOSIS — E034 Atrophy of thyroid (acquired): Secondary | ICD-10-CM

## 2022-12-01 DIAGNOSIS — R490 Dysphonia: Secondary | ICD-10-CM

## 2022-12-01 DIAGNOSIS — G8929 Other chronic pain: Secondary | ICD-10-CM | POA: Insufficient documentation

## 2022-12-01 MED ORDER — LEVOTHYROXINE SODIUM 175 MCG PO TABS
175.0000 ug | ORAL_TABLET | Freq: Every day | ORAL | 0 refills | Status: DC
Start: 2022-12-01 — End: 2022-12-14
  Filled 2022-12-01: qty 90, 90d supply, fill #0

## 2022-12-01 MED ORDER — BUDESONIDE-FORMOTEROL FUMARATE 80-4.5 MCG/ACT IN AERO
2.0000 | INHALATION_SPRAY | Freq: Two times a day (BID) | RESPIRATORY_TRACT | 3 refills | Status: DC
Start: 2022-12-01 — End: 2023-08-21
  Filled 2022-12-01: qty 10.2, fill #0
  Filled 2022-12-01: qty 10.2, 30d supply, fill #0
  Filled 2023-01-19: qty 30.9, 90d supply, fill #0

## 2022-12-01 MED ORDER — ALBUTEROL SULFATE HFA 108 (90 BASE) MCG/ACT IN AERS
2.0000 | INHALATION_SPRAY | Freq: Four times a day (QID) | RESPIRATORY_TRACT | 6 refills | Status: DC | PRN
Start: 2022-12-01 — End: 2023-08-21
  Filled 2022-12-01: qty 6.7, 25d supply, fill #0

## 2022-12-01 NOTE — Patient Instructions (Addendum)
Please keep your upcoming appointment with Dr. Delford Field   1. Hoarseness of voice   2. COPD with chronic bronchitis   3. Hypothyroidism due to acquired atrophy of thyroid   4. 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    It is important that you exercise regularly at least 30 minutes 5 times a week as tolerated  Think about what you will eat, plan ahead. Choose " clean, green, fresh or frozen" over canned, processed or packaged foods which are more sugary, salty and fatty. 70 to 75% of food eaten should be vegetables and fruit. Three meals at set times with snacks allowed between meals, but they must be fruit or vegetables. Aim to eat over a 12 hour period , example 7 am to 7 pm, and STOP after  your last meal of the day. Drink water,generally about 64 ounces per day, no other drink is as healthy. Fruit juice is best enjoyed in a healthy way, by EATING the fruit.  Thanks for choosing [facility] we consider it a privelige to serve you.

## 2022-12-01 NOTE — Assessment & Plan Note (Signed)
Recent neck CT soft tissue, abdominal CT are reassuring reassuring

## 2022-12-01 NOTE — Assessment & Plan Note (Signed)
Patient referred to ENT.

## 2022-12-01 NOTE — Assessment & Plan Note (Signed)
Flowsheet Row Office Visit from 12/01/2022 in [facility]  PHQ-9 Total Score 11     He was on Zoloft and Remeron but not currently And is not willing to restart medication He was encouraged to follow-up with his PCP He denies SI, HI

## 2022-12-01 NOTE — Assessment & Plan Note (Signed)
Smoking cessation encouraged Albuterol inhaler 2 puffs every 6 hours as needed, Symbicort 2 puffs 2 times daily refilled

## 2022-12-01 NOTE — Assessment & Plan Note (Signed)
Levothyroxine 175 mcg 1 tablets daily refilled Patient encouraged to follow-up with his PCP as planned Will need repeat labs in about 4 weeks Lab Results  Component Value Date   TSH 77.218 (H) 11/27/2022

## 2022-12-01 NOTE — Assessment & Plan Note (Signed)
Need to quit smoking discussed,  patient is currently not willing to quit.

## 2022-12-01 NOTE — Assessment & Plan Note (Signed)
Patient encouraged to take OTC NSAIDs as needed

## 2022-12-01 NOTE — Assessment & Plan Note (Deleted)
Patient encouraged to take OTC NSAIDs as needed 

## 2022-12-01 NOTE — Progress Notes (Addendum)
Established Patient Office Visit  Subjective:  Patient ID: [name], male    DOB: 04/29/1966  Age: 57 y.o. MRN: [id number]  CC:  Chief Complaint  Patient presents with   Hospitalization Follow-up    HPI [name] is a 57 y.o. 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 Patient of Dr. Delford Field has an upcoming appointment with him on 12/14/2022  Patient presented today to emergency department on 11/27/2022 with multiple complaints including facial swelling , worsening hoarseness, groin pain, shortness of breath.  Lack of insurance had prevented him from following up with ENT.  Patient denies fever, chills, chest pain, nausea, vomiting.  Patient stated that his groin pain started after he fell about a year ago , examination at the emergency department including testicular examination was unremarkable labs unremarkable CT soft tissue neck, abdomen pelvis with reassuring.  The was no evidence of recurrence of his cancer.  Urology had recommended NSAIDs for his groin pain which they think is musculoskeletal.   Hypothyroidism .he had stopped taking levothyroxine because he thought the medication was not working, however he had restarted the home medication after his recent ED visit.   COPD. Smokes 1 ppd since  age 68 .  He complains of shortness of breath, cough, wheezing.  He he reports he  has been using albuterol inhaler and Symbicort as ordered.   He is currently living with his daughter who accompanied him today.     Past Medical History:  Diagnosis Date   COPD (chronic obstructive pulmonary disease) (HCC)    Headache(784.0)    Heart murmur    child   Hx of radiation therapy 05/19/13- 06/27/13   glottic larynx, right true vocal  cord 6300 cGy 28 sessions   Hypertension    Hypothyroidism    Influenza B 08/01/2018   Long term (current) use of opiate analgesic 10/11/2016   Polysubstance abuse (HCC)    Polysubstance dependence including opioid type drug, episodic abuse, with delirium (HCC) 03/22/2017   Shortness of breath    Squamous cell carcinoma of vocal cord (HCC) 09/11/2013   Vocal cord cancer (HCC) 05/01/2013    Past Surgical History:  Procedure Laterality Date   BALLOON DILATION N/A 12/30/2013   Procedure: BALLOON DILATION;  Surgeon: Charolett Bumpers, MD;  Location: [facility];  Service: Endoscopy;  Laterality: N/A;   DIRECT LARYNGOSCOPY Right 04/24/2013   Procedure: DIRECT LARYNGOSCOPY and BIOPSY OF TONGUE;  Surgeon: Osborn Coho, MD;  Location: [facility] OR;  Service: ENT;  Laterality: Right;   ESOPHAGEAL MANOMETRY N/A 09/07/2014   Procedure: ESOPHAGEAL MANOMETRY (EM);  Surgeon: Charolett Bumpers, MD;  Location: [facility];  Service: Endoscopy;  Laterality: N/A;   ESOPHAGOGASTRODUODENOSCOPY (EGD) WITH PROPOFOL N/A 12/30/2013   Procedure: ESOPHAGOGASTRODUODENOSCOPY (EGD) WITH PROPOFOL;  Surgeon: Charolett Bumpers, MD;  Location: [facility];  Service: Endoscopy;  Laterality: N/A;   MICROLARYNGOSCOPY Right 04/24/2013   Procedure: MICROLARYNGOSCOPY and RIGHT VOCAL CORD BIOPSY;  Surgeon: Osborn Coho, MD;  Location: [facility] OR;  Service: ENT;  Laterality: Right;   MULTIPLE EXTRACTIONS WITH ALVEOLOPLASTY N/A 09/30/2013   Procedure: Extraction of tooth #'s 2,3,4,5,6,7,8,9,10,11,12,15,22,23,24,25,26,27,28 with alveoloplasty;  Surgeon: Charlynne Pander, DDS;  Location: [facility];  Service: Oral Surgery;  Laterality: N/A;   TONSILLECTOMY     as  a child   w[place] teeth     age 74    Family History  Problem Relation Age of Onset   COPD Mother    Emphysema Mother    Heart attack Father     Social History   Socioeconomic History   Marital status: Legally Separated    Spouse name: Not on file   Number of children: 3   Years  of education: Not on file   Highest education level: Not on file  Occupational History    Employer: [employer]    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  Tobacco Use   Smoking status: Every Day    Packs/day: 1.00    Years: 34.00    Additional pack years: 0.00    Total pack years: 34.00    Types: Cigarettes   Smokeless tobacco: Never   Tobacco comments:    DOWN TO 4 - 5 CIGARETTES A DAY  Vaping Use   Vaping Use: Never used  Substance and Sexual Activity   Alcohol use: No    Alcohol/week: 0.0 standard drinks of alcohol   Drug use: Not Currently    Types: Marijuana, Cocaine, Heroin    Comment: He hasn't used drugs in "over a year"- 06/27/19   Sexual activity: Yes    Birth control/protection: Condom  Other Topics Concern   Not on file  Social History Narrative   The patient is married. Patient has 3 children. 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   The patient has a history of smoking 2 packs of cigarette a day for 34 years. Patient current is trying to quit smoking. Patient denies use of alcohol. Patient denies use of other illicit drugs.   Social Determinants of Health   Financial Resource Strain: Not on file  Food Insecurity: Not on file  Transportation Needs: No Transportation Needs (06/27/2019)   PRAPARE - Administrator, Civil Service (Medical): No    Lack of Transportation (Non-Medical): No  Physical Activity: Not on file  Stress: Not on file  Social Connections: Not on file  Intimate Partner Violence: Not At Risk (06/27/2019)   Humiliation, Afraid, Rape, and Kick questionnaire    Fear of Current or Ex-Partner: No    Emotionally Abused: No    Physically Abused: No    Sexually Abused: No    Outpatient Medications Prior to Visit  Medication Sig Dispense Refill   naproxen (NAPROSYN) 500 MG tablet Take 1 tablet (500 mg total) by mouth 2 (two) times daily as needed for moderate pain (if having testicular pain). 30 tablet 0   vitamin B-12 (CYANOCOBALAMIN) 1000  MCG tablet Take 1 tablet (1,000 mcg total) by mouth daily. 90 tablet 3   albuterol (VENTOLIN HFA) 108 (90 Base) MCG/ACT inhaler INHALE 2 PUFFS INTO THE LUNGS EVERY 4 (FOUR) HOURS AS NEEDED FOR WHEEZING OR SHORTNESS OF BREATH. 18 g 1   budesonide-formoterol (SYMBICORT) 80-4.5 MCG/ACT inhaler Inhale 2 puffs into the lungs 2 (two) times daily. 10.2 g 3   levothyroxine (SYNTHROID) 175 MCG tablet TAKE 1 TABLET (175 MCG TOTAL) BY MOUTH DAILY BEFORE BREAKFAST. FOR THYROID HORMONE REPLACEMENT 90 tablet 2   fluticasone (FLONASE) 50 MCG/ACT nasal spray Place 2 sprays into both nostrils daily. (Patient not taking: Reported on 12/01/2022) 16 g 6   gabapentin (NEURONTIN) 300 MG capsule TAKE 1 CAPSULE (300 MG TOTAL) BY MOUTH 3 (THREE) TIMES DAILY. (Patient not taking: Reported on 12/01/2022) 180 capsule 3   mirtazapine (REMERON) 7.5 MG  tablet TAKE 1 TABLET (7.5 MG TOTAL) BY MOUTH AT BEDTIME. (Patient not taking: Reported on 12/01/2022) 30 tablet 1   sertraline (ZOLOFT) 50 MG tablet Take 1 tablet (50 mg total) by mouth daily. (Patient not taking: Reported on 12/01/2022) 30 tablet 3   No facility-administered medications prior to visit.    Allergies  Allergen Reactions   Fentanyl Nausea And Vomiting   Citalopram Other (See Comments)    Dizziness and shakiness.   Morphine And Related Nausea And Vomiting    ROS Review of Systems  Constitutional:  Negative for appetite change, chills, diaphoresis, fatigue, fever and unexpected weight change.  HENT:  Positive for voice change. Negative for congestion, dental problem, drooling and ear discharge.   Eyes:  Negative for pain, discharge, redness and itching.  Respiratory:  Positive for shortness of breath and wheezing. Negative for apnea, cough, choking and chest tightness.        Not worsening   Cardiovascular: Negative.  Negative for chest pain, palpitations and leg swelling.  Gastrointestinal:  Negative for abdominal distention, abdominal pain, anal bleeding,  blood in stool, constipation, diarrhea and vomiting.  Endocrine: Negative for polydipsia, polyphagia and polyuria.  Genitourinary:  Negative for difficulty urinating, flank pain, frequency and genital sores.  Musculoskeletal: Negative.  Negative for arthralgias, back pain, gait problem and joint swelling.  Skin:  Negative for color change, pallor and rash.  Neurological:  Negative for dizziness, facial asymmetry, light-headedness, numbness and headaches.  Psychiatric/Behavioral:  Negative for agitation, behavioral problems, confusion, hallucinations, self-injury, sleep disturbance and suicidal ideas.       Objective:    Physical Exam Vitals and nursing note reviewed.  Constitutional:      General: He is not in acute distress.    Appearance: Normal appearance. He is obese. He is not ill-appearing, toxic-appearing or diaphoretic.  HENT:     Right Ear: External ear normal.     Left Ear: External ear normal.     Mouth/Throat:     Mouth: Mucous membranes are moist.     Pharynx: Oropharynx is clear. No oropharyngeal exudate or posterior oropharyngeal erythema.     Comments: Voice is hoarse Eyes:     General: No scleral icterus.       Right eye: No discharge.        Left eye: No discharge.     Extraocular Movements: Extraocular movements intact.     Conjunctiva/sclera: Conjunctivae normal.  Cardiovascular:     Rate and Rhythm: Normal rate and regular rhythm.     Pulses: Normal pulses.     Heart sounds: Normal heart sounds. No murmur heard.    No friction rub. No gallop.  Pulmonary:     Effort: Pulmonary effort is normal. No respiratory distress.     Breath sounds: Normal breath sounds. No stridor. No wheezing, rhonchi or rales.  Chest:     Chest wall: No tenderness.  Abdominal:     General: There is no distension.     Palpations: Abdomen is soft.     Tenderness: There is no abdominal tenderness. There is no right CVA tenderness, left CVA tenderness or guarding.  Musculoskeletal:         General: No swelling, tenderness, deformity or signs of injury.     Right lower leg: No edema.     Left lower leg: No edema.  Skin:    General: Skin is warm and dry.     Capillary Refill: Capillary refill takes 2 to 3 seconds.  Coloration: Skin is not jaundiced or pale.     Findings: No bruising, erythema or lesion.  Neurological:     Mental Status: He is alert and oriented to person, place, and time.     Motor: No weakness.     Coordination: Coordination normal.     Gait: Gait normal.  Psychiatric:        Mood and Affect: Mood normal.        Behavior: Behavior normal.        Thought Content: Thought content normal.        Judgment: Judgment normal.     BP 123/79   Pulse 88   Temp (!) 97.3 F (36.3 C)   Wt 171 lb 9.6 oz (77.8 kg)   SpO2 100%   BMI 23.93 kg/m  Wt Readings from Last 3 Encounters:  12/01/22 171 lb 9.6 oz (77.8 kg)  11/27/22 150 lb (68 kg)  10/11/21 158 lb 6.4 oz (71.8 kg)    Lab Results  Component Value Date   TSH 77.218 (H) 11/27/2022   Lab Results  Component Value Date   WBC 4.2 11/27/2022   HGB 12.1 (L) 11/27/2022   HCT 35.9 (L) 11/27/2022   MCV 102.9 (H) 11/27/2022   PLT 176 11/27/2022   Lab Results  Component Value Date   NA 139 11/27/2022   K 3.6 11/27/2022   CHLORIDE 109 03/18/2014   CO2 24 11/27/2022   GLUCOSE 106 (H) 11/27/2022   BUN 16 11/27/2022   CREATININE 1.63 (H) 11/27/2022   BILITOT 0.7 11/27/2022   ALKPHOS 39 11/27/2022   AST 44 (H) 11/27/2022   ALT 21 11/27/2022   PROT 6.6 11/27/2022   ALBUMIN 4.0 11/27/2022   CALCIUM 9.3 11/27/2022   ANIONGAP 9 11/27/2022   EGFR 49 (L) 10/11/2021   Lab Results  Component Value Date   CHOL 223 (H) 10/11/2021   Lab Results  Component Value Date   HDL 45 10/11/2021   Lab Results  Component Value Date   LDLCALC 150 (H) 10/11/2021   Lab Results  Component Value Date   TRIG 154 (H) 10/11/2021   Lab Results  Component Value Date   CHOLHDL 5.0 10/11/2021   Lab  Results  Component Value Date   HGBA1C 6.0 (A) 07/29/2020      Assessment & Plan:   Problem List Items Addressed This Visit       Respiratory   COPD with chronic bronchitis    Smoking cessation encouraged Albuterol inhaler 2 puffs every 6 hours as needed, Symbicort 2 puffs 2 times daily refilled      Relevant Medications   budesonide-formoterol (SYMBICORT) 80-4.5 MCG/ACT inhaler   albuterol (VENTOLIN HFA) 108 (90 Base) MCG/ACT inhaler     Endocrine   Hypothyroidism due to acquired atrophy of thyroid (Chronic)    Levothyroxine 175 mcg 1 tablets daily refilled Patient encouraged to follow-up with his PCP as planned Will need repeat labs in about 4 weeks Lab Results  Component Value Date   TSH 77.218 (H) 11/27/2022         Relevant Medications   levothyroxine (SYNTHROID) 175 MCG tablet     Other   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 (Chronic)    Recent neck CT soft tissue, abdominal CT are reassuring reassuring      Relevant Orders   Ambulatory referral to ENT   Tobacco use disorder (Chronic)    Need to quit smoking discussed,  patient  is currently not willing to quit.       Hoarseness of voice    Patient referred to ENT      Relevant Orders   Ambulatory referral to ENT   Right inguinal pain - Primary    Patient encouraged to take OTC NSAIDs as needed      Depression    Flowsheet Row Office Visit from 12/01/2022 in [facility]  PHQ-9 Total Score 11     He was on Zoloft and Remeron but not currently And is not willing to restart medication He was encouraged to follow-up with his PCP He denies SI, HI       Meds ordered this encounter  Medications   budesonide-formoterol (SYMBICORT) 80-4.5 MCG/ACT inhaler    Sig: Inhale 2 puffs into the lungs 2 (two) times daily.    Dispense:  10.2 g    Refill:  3    PAP/PASS only-faxed MD portion of app to office 12/01/22 for signature kg-pt's portion complete    albuterol (VENTOLIN HFA) 108 (90 Base) MCG/ACT inhaler    Sig: Inhale 2 puffs into the lungs every 6 (six) hours as needed for wheezing or shortness of breath.    Dispense:  6.7 g    Refill:  6   levothyroxine (SYNTHROID) 175 MCG tablet    Sig: TAKE 1 TABLET (175 MCG TOTAL) BY MOUTH DAILY BEFORE BREAKFAST. FOR THYROID HORMONE REPLACEMENT    Dispense:  90 tablet    Refill:  0    Follow-up: No follow-ups on file.    Donell Beers, FNP

## 2022-12-04 ENCOUNTER — Other Ambulatory Visit: Payer: Self-pay

## 2022-12-11 ENCOUNTER — Other Ambulatory Visit: Payer: Self-pay

## 2022-12-13 NOTE — Progress Notes (Unsigned)
Patient ID: [name], male   DOB: 1966/07/07, 57 y.o.   MRN: [id number]   This patient presents to the ED for concern of testicular pain, swelling, hoarse voice, shortness of breath, this involves an extensive number of treatment options, and is a complaint that carries with it a high risk of complications and morbidity.  The differential diagnosis includes recurrence of malignancy, vocal cord paralysis, basal cell carcinoma, CHF exacerbation, nephrotic syndrome, hypothyroidism   MDM: Patient seen emergency room for evaluation of multiple complaints as described above.  Physical exam with bilateral swelling under the orbits, a small basal cell carcinoma over the right nasolabial fold, minimal pitting edema to lower extremities.  Testicular exam largely unremarkable.  Laboratory evaluation largely unremarkable with normal BNP, no proteinuria in urine.  Due to concern for possible recurrence of underlying malignancy, patient received extensive CT imaging including CT soft tissue neck, CT chest abdomen pelvis which reassuringly did not show any evidence of malignancy.  The vocal cords were opposed at the time of the imaging which limits complete rule out for head neck cancer recurrence but there is no obvious evidence of recurrence at this time with no pathologic lymphadenopathy in the neck.  No convincing evidence of bilateral vocal cord paralysis at this time as the patient is not in respiratory distress and does not have stridor.  Testicular ultrasound shows a 3 mm left testicular calcification and I did speak with Dr. Mena Goes of urology to ensure that this lesion would not cause intermittent torsion.  Dr. Mena Goes states that this is not a risk factor for intermittent torsion of the testicle and patient's pain is likely referred from his back.  Patient does state that he has had a lot of issues with his back recently and we will start the patient on an NSAID regimen for this.  At time of signout,  patient pending thyroid studies.  There is no vital sign abnormalities or convincing physical exam evidence of significant myxedema today and patient likely will be safe for discharge with outpatient follow-up regardless but he will likely need to restart his Synthroid and reach out to his primary care physician to do this safely.

## 2022-12-14 ENCOUNTER — Ambulatory Visit: Payer: Self-pay | Attending: Physician Assistant | Admitting: Physician Assistant

## 2022-12-14 ENCOUNTER — Other Ambulatory Visit: Payer: Self-pay

## 2022-12-14 ENCOUNTER — Encounter: Payer: Self-pay | Admitting: Physician Assistant

## 2022-12-14 VITALS — BP 130/90 | HR 85 | Temp 98.5°F | Resp 18 | Ht 71.0 in | Wt 161.8 lb

## 2022-12-14 DIAGNOSIS — E034 Atrophy of thyroid (acquired): Secondary | ICD-10-CM

## 2022-12-14 DIAGNOSIS — G8929 Other chronic pain: Secondary | ICD-10-CM

## 2022-12-14 DIAGNOSIS — Z8589 Personal history of malignant neoplasm of other organs and systems: Secondary | ICD-10-CM

## 2022-12-14 DIAGNOSIS — H539 Unspecified visual disturbance: Secondary | ICD-10-CM

## 2022-12-14 DIAGNOSIS — R1031 Right lower quadrant pain: Secondary | ICD-10-CM

## 2022-12-14 DIAGNOSIS — R7989 Other specified abnormal findings of blood chemistry: Secondary | ICD-10-CM

## 2022-12-14 DIAGNOSIS — Z09 Encounter for follow-up examination after completed treatment for conditions other than malignant neoplasm: Secondary | ICD-10-CM

## 2022-12-14 DIAGNOSIS — R490 Dysphonia: Secondary | ICD-10-CM

## 2022-12-14 DIAGNOSIS — E860 Dehydration: Secondary | ICD-10-CM

## 2022-12-14 MED ORDER — LEVOTHYROXINE SODIUM 175 MCG PO TABS
175.0000 ug | ORAL_TABLET | Freq: Every day | ORAL | 0 refills | Status: DC
Start: 2022-12-14 — End: 2023-07-13
  Filled 2022-12-14 – 2023-03-21 (×3): qty 90, 90d supply, fill #0

## 2022-12-14 NOTE — Patient Instructions (Signed)
Drink 80 to 100 ounces water daily   Hypothyroidism  Hypothyroidism is when the thyroid gland does not make enough of certain hormones. This is called an underactive thyroid. The thyroid gland is a small gland located in the lower front part of the neck, just in front of the windpipe (trachea). This gland makes hormones that help control how the body uses food for energy (metabolism) as well as how the heart and brain function. These hormones also play a role in keeping your bones strong. When the thyroid is underactive, it produces too little of the hormones thyroxine (T4) and triiodothyronine (T3). What are the causes? This condition may be caused by: Hashimoto's disease. This is a disease in which the body's disease-fighting system (immune system) attacks the thyroid gland. This is the most common cause. Viral infections. Pregnancy. Certain medicines. Birth defects. Problems with a gland in the center of the brain (pituitary gland). Lack of enough iodine in the diet. Other causes may include: Past radiation treatments to the head or neck for cancer. Past treatment with radioactive iodine. Past exposure to radiation in the environment. Past surgical removal of part or all of the thyroid. What increases the risk? You are more likely to develop this condition if: You are male. You have a family history of thyroid conditions. You use a medicine called lithium. You take medicines that affect the immune system (immunosuppressants). What are the signs or symptoms? Common symptoms of this condition include: Not being able to tolerate cold. Feeling as though you have no energy (lethargy). Lack of appetite. Constipation. Sadness or depression. Weight gain that is not explained by a change in diet or exercise habits. Menstrual irregularity. Dry skin, coarse hair, or brittle nails. Other symptoms may include: Muscle pain. Slowing of thought processes. Poor memory. How is this  diagnosed? This condition may be diagnosed based on: Your symptoms, your medical history, and a physical exam. Blood tests. You may also have imaging tests, such as an ultrasound or MRI. How is this treated? This condition is treated with medicine that replaces the thyroid hormones that your body does not make. After you begin treatment, it may take several weeks for symptoms to go away. Follow these instructions at home: Take over-the-counter and prescription medicines only as told by your health care provider. If you start taking any new medicines, tell your health care provider. Keep all follow-up visits as told by your health care provider. This is important. As your condition improves, your dosage of thyroid hormone medicine may change. You will need to have blood tests regularly so that your health care provider can monitor your condition. Contact a health care provider if: Your symptoms do not get better with treatment. You are taking thyroid hormone replacement medicine and you: Sweat a lot. Have tremors. Feel anxious. Lose weight rapidly. Cannot tolerate heat. Have emotional swings. Have diarrhea. Feel weak. Get help right away if: You have chest pain. You have an irregular heartbeat. You have a rapid heartbeat. You have difficulty breathing. These symptoms may be an emergency. Get help right away. Call 911. Do not wait to see if the symptoms will go away. Do not drive yourself to the hospital. Summary Hypothyroidism is when the thyroid gland does not make enough of certain hormones (it is underactive). When the thyroid is underactive, it produces too little of the hormones thyroxine (T4) and triiodothyronine (T3). The most common cause is Hashimoto's disease, a disease in which the body's disease-fighting system (immune system) attacks  the thyroid gland. The condition can also be caused by viral infections, medicine, pregnancy, or past radiation treatment to the head or  neck. Symptoms may include weight gain, dry skin, constipation, feeling as though you do not have energy, and not being able to tolerate cold. This condition is treated with medicine to replace the thyroid hormones that your body does not make. This information is not intended to replace advice given to you by your health care provider. Make sure you discuss any questions you have with your health care provider. Document Revised: 07/12/2021 Document Reviewed: 07/12/2021 Elsevier Patient Education  2023 [employer].

## 2022-12-14 NOTE — Progress Notes (Signed)
Pt is here for ER f/u   Pt states he is not feeling any better since being discharged, states the right sided groin pain comes and goes   Urinary frequency

## 2022-12-22 ENCOUNTER — Other Ambulatory Visit: Payer: Self-pay

## 2022-12-29 ENCOUNTER — Other Ambulatory Visit: Payer: Self-pay

## 2023-01-08 ENCOUNTER — Other Ambulatory Visit: Payer: Self-pay

## 2023-01-10 ENCOUNTER — Other Ambulatory Visit: Payer: Self-pay

## 2023-01-16 ENCOUNTER — Other Ambulatory Visit: Payer: Self-pay

## 2023-01-19 ENCOUNTER — Other Ambulatory Visit: Payer: Self-pay

## 2023-01-26 ENCOUNTER — Other Ambulatory Visit: Payer: Self-pay

## 2023-02-23 ENCOUNTER — Other Ambulatory Visit: Payer: Self-pay

## 2023-02-23 ENCOUNTER — Other Ambulatory Visit: Payer: Self-pay | Admitting: Critical Care Medicine

## 2023-02-23 DIAGNOSIS — R202 Paresthesia of skin: Secondary | ICD-10-CM

## 2023-02-23 MED ORDER — GABAPENTIN 300 MG PO CAPS
300.0000 mg | ORAL_CAPSULE | Freq: Three times a day (TID) | ORAL | 3 refills | Status: DC
Start: 2023-02-23 — End: 2023-08-21
  Filled 2023-02-23: qty 90, 30d supply, fill #0
  Filled 2023-03-21: qty 180, 60d supply, fill #0

## 2023-03-02 ENCOUNTER — Other Ambulatory Visit: Payer: Self-pay

## 2023-03-18 NOTE — Progress Notes (Unsigned)
Subjective:    Patient ID: [name], male    DOB: [birth year], 57 y.o.   MRN: [id number]  06/24/19 57 y.o.M here for f/u COPD Homelessnes This patient [place] and was seen previously in the clinic earlier in November by our physician assistant.  Documentation from that visit is as below.   [name] is a 57 y.o. male here to establish care and as hospital follow up.  He went to ED for med RF.  Homeless.  Out of thyroid meds ~ 1 month before restarting thyroid meds on 05/13/2019.  Denies drinking much alcohol.  C/o paresthesias in fingers and toes not controlled on gabapentin 200mg  tid.  Also severe fatigue.  Poor breathing.  Still smoking but trying to cut back.  H/o thyroid CA s/p radiation.     GABA did not help   1. Paresthesias - Thyroid Panel With TSH - gabapentin (NEURONTIN) 300 MG capsule; Take 1 capsule (300 mg total) by mouth 3 (three) times daily.  Dispense: 90 capsule; Refill: 3 - Vitamin B12 - Folate   2. Hypothyroidism due to acquired atrophy of thyroid Will not adjust based on this reading bc he just resumed meds 10/20 - Thyroid Panel With TSH - levothyroxine (SYNTHROID) 175 MCG tablet; Take 1 tablet (175 mcg total) by mouth daily before breakfast. For thyroid hormone replacement  Dispense: 30 tablet; Refill: 2   3. Fatigue, unspecified type   4. Insomnia, unspecified type   - mirtazapine (REMERON) 7.5 MG tablet; Take 1 tablet (7.5 mg total) by mouth at bedtime. For sleep  Dispense: 30 tablet; Refill: 0 - traZODone (DESYREL) 150 MG tablet; Take 1 tablet (150 mg total) by mouth at bedtime as needed for sleep.  Dispense: 30 tablet; Refill: 3   5. Anxiety - hydrOXYzine (ATARAX/VISTARIL) 25 MG tablet; Take 1 tablet (25 mg total) by mouth 3 (three) times daily as needed for anxiety.  Dispense: 60 tablet; Refill: 0   6. Hypertension, unspecified type Not controlled-resume meds - metoprolol succinate (TOPROL-XL) 25 MG 24 hr  tablet; Take 1 tablet (25 mg total) by mouth daily. For high blood pressure  Dispense: 30 tablet; Refill: 2   7. Homeless I am - Ambulatory referral to Social Work      8. Shortness of breath -unclear if he has emphysema or COPD?;  Will add- - Fluticasone-Salmeterol (ADVAIR DISKUS) 250-50 MCG/DOSE AEPB; Inhale 1 puff into the lungs 2 (two) times daily.  Dispense: 1 each; Refill: 3  01/06/20 The patient states the insomnia has been improved the use of Remeron and trazodone.  He states hydroxyzine also has helped the anxiety levels.  He also is taking the metoprolol 25 mg daily and this has been helpful as well.  He is using the Advair as well at 1 puff twice a day and this is helped his breathing.  He has not had a recent chest x-ray.  The patient also resume the levothyroxine and note lab results in the last visit showed still hypothyroidism and part of this was he was out of medication.  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  He has not been seen in follow-up in some time.  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  Note the patient did formally use heroin but he has been free of substance  abuse for many years.  He also does not drink alcohol.  He is still smoking however a pack a day of cigarettes  The patient does have very mild dysphagia at this time Note B12 levels did come back low and these were checked to evaluate for paresthesias therefore we will need to r begin B12 supplementation  01/05/2020 Since the last visit in December 2020 the patient continues to smoke a half a pack a day. He is maintaining his blood pressure medications. He has had no further flareups of his lung disease but has run out of his inhalers. The patient's not been emergency room for his breathing since the last visit. He is living in a tent encampment time friend's home in  [place]. He still has some swallowing issues and is edentulous. He states his dentures did not fit him very well. He is able to maintain some nutrition. Note his weight is down to 141 with a BMI of 19.6  He was not able to achieve an ENT appointment to follow-up on prior history of vocal cord cancer status post radiation to the neck. The cancer center stated he would need to see an ENT for evaluation. The patient is requesting 90-day refills on medications. He has been compliant with his Synthroid. He needs follow-up labs on the Synthroid. He is yet to receive his Covid vaccine.  04/07/2020 Patient returns in follow-up for his COPD and hypertension.  He now has a trailer to live down.  He complains of increased shortness of breath and cough.  He is smoking still but less.  He did receive his Moderrna vaccine through a homebound vaccine program  07/29/2020 Patient seen return follow-up and notes increased shortness of breath increased cough thick black mucus also notes increased swelling in the left neck increased hoarseness and inability to swallow as effectively.  Prior history of vocal cord cancer status post radiation therapy.  He is also on a dry powdered inhaler Advair which is creating difficulty with his cough and his upper airway.  Note this patient still is homeless living in a tent environment.  He is looking for a Materna booster and he was already in the homebound vaccine program  10/11/21 Patient returns in follow-up has not been seen in over a year.  He no longer lives in a tent he is now living in housing in [place].  He has been out of all his medications for 5 months.  He has severe hypothyroidism from previous radiotherapy to the neck.  He still having difficulty swallowing.  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  He has troubles with swallowing.  He still has no insurance not able to see any  specialist.  He cannot get in with ENT because able cause him to pay out-of-pocket.  He has dry skin with some itching.  He still has paresthesias in the hands and feet.  His PHQ 9 and GAD-7 are extremely elevated.  The patient is not suicidal.  Chest CT scan at the last visit was negative except for emphysema.  Patient is still smoking a half a pack a day of cigarettes.  Blood pressure on arrival 115/84 and has been off metoprolol for 5 months.  11/2022 [name]  Hypothyroidism due to acquired atrophy of thyroid Just resumed meds 5/10 - levothyroxine (SYNTHROID) 175 MCG tablet; TAKE 1 TABLET (175 MCG TOTAL) BY MOUTH DAILY BEFORE BREAKFAST. FOR THYROID HORMONE REPLACEMENT  Dispense: 90 tablet; Refill: 0   2. Hoarseness of voice S/p #4 - Ambulatory referral to ENT   3. Groin pain, chronic, right Less and studies were unremarkable   4. 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 Dehydration Increase water intake 80-100 ounces water daily   6. Elevated liver function tests Avoid alcohol/acetaminophen   7. Vision changes Not acute-occurring over the last year - Ambulatory referral to Ophthalmology   8. 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  Blood pressure on arrival was elevated 167/103.  He has a lesion under his right eye that needs to be attended to.  There are no other complaints.  Breathing is at baseline.   Shortness of Breath This is a chronic problem. The current episode started more than 1 year ago. The problem occurs daily. The problem has been gradually worsening. Associated symptoms include a sore throat and sputum production. Pertinent negatives include no chest pain, ear pain, fever, headaches, hemoptysis, orthopnea, PND, rhinorrhea,  vomiting or wheezing. The symptoms are aggravated by smoke, odors, exercise and any activity. Associated symptoms comments: Mucus is clear  Voice gone Sore throat. Risk factors include smoking. He has tried beta agonist inhalers and steroid inhalers for the symptoms. The treatment provided mild relief. His past medical history is significant for COPD. There is no history of asthma, bronchiolitis, CAD, DVT, PE or pneumonia.   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   Vocal cord cancer (HCC) 05/01/2013     Family History  Problem Relation Age of Onset   COPD Mother    Emphysema Mother    Heart attack Father      Social History   Socioeconomic History   Marital status: Legally Separated    Spouse name: Not on file   Number of children: 3   Years of education: Not on file   Highest education level: Not on file  Occupational History    Employer: [employer]    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  Tobacco Use   Smoking status: Every Day    Current packs/day: 1.00    Average packs/day: 1 pack/day for 34.0 years (34.0 ttl pk-yrs)    Types: Cigarettes   Smokeless tobacco: Never   Tobacco comments:    DOWN TO 4 - 5 CIGARETTES A DAY  Vaping Use   Vaping status: Never Used  Substance and Sexual Activity   Alcohol use: No    Alcohol/week: 0.0 standard drinks of alcohol   Drug use: Not Currently    Types: Marijuana, Cocaine, Heroin    Comment: He hasn't used drugs in "over  a year"- 06/27/19   Sexual activity:  Yes    Birth control/protection: Condom  Other Topics Concern   Not on file  Social History Narrative   The patient is married. Patient has 3 children. 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   The patient has a history of smoking 2 packs of cigarette a day for 34 years. Patient current is trying to quit smoking. Patient denies use of alcohol. Patient denies use of other illicit drugs.   Social Determinants of Health   Financial Resource Strain: Not on file  Food Insecurity: Not on file  Transportation Needs: No Transportation Needs (06/27/2019)   PRAPARE - Administrator, Civil Service (Medical): No    Lack of Transportation (Non-Medical): No  Physical Activity: Not on file  Stress: Not on file  Social Connections: Not on file  Intimate Partner Violence: Not At Risk (06/27/2019)   Humiliation, Afraid, Rape, and Kick questionnaire    Fear of Current or Ex-Partner: No    Emotionally Abused: No    Physically Abused: No    Sexually Abused: No     Allergies  Allergen Reactions   Fentanyl Nausea And Vomiting   Citalopram Other (See Comments)    Dizziness and shakiness.   Morphine And Codeine Nausea And Vomiting     Outpatient Medications Prior to Visit  Medication Sig Dispense Refill   albuterol (VENTOLIN HFA) 108 (90 Base) MCG/ACT inhaler Inhale 2 puffs into the lungs every 6 (six) hours as needed for wheezing or shortness of breath. 6.7 g 6   budesonide-formoterol (SYMBICORT) 80-4.5 MCG/ACT inhaler Inhale 2 puffs into the lungs 2 (two) times daily. 10.3 g 3   fluticasone (FLONASE) 50 MCG/ACT nasal spray Place 2 sprays into both nostrils daily. 16 g 6   gabapentin (NEURONTIN) 300 MG capsule Take 1 capsule (300 mg total) by mouth 3 (three) times daily. 180 capsule 3   levothyroxine (SYNTHROID) 175 MCG tablet TAKE 1 TABLET (175 MCG TOTAL) BY MOUTH DAILY BEFORE BREAKFAST. FOR THYROID HORMONE REPLACEMENT 90 tablet 0   vitamin B-12  (CYANOCOBALAMIN) 1000 MCG tablet Take 1 tablet (1,000 mcg total) by mouth daily. 90 tablet 3   naproxen (NAPROSYN) 500 MG tablet Take 1 tablet (500 mg total) by mouth 2 (two) times daily as needed for moderate pain (if having testicular pain). 30 tablet 0   sertraline (ZOLOFT) 50 MG tablet Take 1 tablet (50 mg total) by mouth daily. 30 tablet 3   mirtazapine (REMERON) 7.5 MG tablet TAKE 1 TABLET (7.5 MG TOTAL) BY MOUTH AT BEDTIME. (Patient not taking: Reported on 12/01/2022) 30 tablet 1   No facility-administered medications prior to visit.     Review of Systems  Constitutional:  Negative for fever.  HENT:  Positive for hearing loss, sore throat, trouble swallowing and voice change. Negative for ear pain and rhinorrhea.        Has fullness in the ears decreased hearing  Respiratory:  Positive for sputum production and shortness of breath. Negative for hemoptysis, choking, chest tightness and wheezing.        Throat pain  Cardiovascular:  Negative for chest pain, orthopnea and PND.  Gastrointestinal:  Negative for vomiting.       Gerd  Endocrine: Negative for polyuria.  Genitourinary: Negative.   Neurological:  Negative for headaches.  Psychiatric/Behavioral:  Negative for dysphoric mood, self-injury and suicidal ideas.        Objective:   Physical Exam Vitals reviewed.  Constitutional:      Appearance: Normal  appearance. He is well-developed. He is not diaphoretic.     Comments: Thin  HENT:     Head: Normocephalic and atraumatic.     Right Ear: Tympanic membrane, ear canal and external ear normal. There is no impacted cerumen.     Left Ear: Tympanic membrane, ear canal and external ear normal. There is no impacted cerumen.     Nose: No nasal deformity, septal deviation, mucosal edema, congestion or rhinorrhea.     Right Sinus: No maxillary sinus tenderness or frontal sinus tenderness.     Left Sinus: No maxillary sinus tenderness or frontal sinus tenderness.     Mouth/Throat:      Mouth: Mucous membranes are moist.     Pharynx: Oropharynx is clear. No oropharyngeal exudate or posterior oropharyngeal erythema.  Eyes:     General: No scleral icterus.    Conjunctiva/sclera: Conjunctivae normal.     Pupils: Pupils are equal, round, and reactive to light.  Neck:     Thyroid: No thyromegaly.     Vascular: No carotid bruit or JVD.     Trachea: Trachea normal. No tracheal tenderness or tracheal deviation.  Cardiovascular:     Rate and Rhythm: Normal rate and regular rhythm.     Chest Wall: PMI is not displaced.     Pulses: Normal pulses. No decreased pulses.     Heart sounds: Normal heart sounds, S1 normal and S2 normal. Heart sounds not distant. No murmur heard.    No systolic murmur is present.     No diastolic murmur is present.     No friction rub. No gallop. No S3 or S4 sounds.  Pulmonary:     Effort: Pulmonary effort is normal. No tachypnea, accessory muscle usage or respiratory distress.     Breath sounds: Normal breath sounds. No stridor. No decreased breath sounds, wheezing, rhonchi or rales.     Comments: Distant breath sounds Chest:     Chest wall: No tenderness.  Abdominal:     General: Bowel sounds are normal. There is no distension.     Palpations: Abdomen is soft. Abdomen is not rigid.     Tenderness: There is no abdominal tenderness. There is no guarding or rebound.  Musculoskeletal:        General: Normal range of motion.     Cervical back: Normal range of motion and neck supple. No edema, erythema or rigidity. No muscular tenderness. Normal range of motion.  Lymphadenopathy:     Head:     Right side of head: No submental or submandibular adenopathy.     Left side of head: No submental or submandibular adenopathy.     Cervical: No cervical adenopathy.  Skin:    General: Skin is warm and dry.     Coloration: Skin is not pale.     Findings: Lesion present. No erythema or rash.     Nails: There is no clubbing.     Comments: Lesion compatible  with early basal cell carcinoma of the skin under the left I lid  Neurological:     Mental Status: He is alert and oriented to person, place, and time.     Sensory: No sensory deficit.  Psychiatric:        Attention and Perception: Attention and perception normal.        Mood and Affect: Affect normal. Mood is depressed.        Speech: Speech normal.        Behavior: Behavior normal. Behavior is  cooperative.        Thought Content: Thought content does not include homicidal or suicidal ideation. Thought content does not include homicidal or suicidal plan.        Cognition and Memory: Cognition and memory normal.        Judgment: Judgment normal.     BP (!) 167/103 (BP Location: Right Arm, Patient Position: Sitting, Cuff Size: Normal)   Pulse 74   Wt 154 lb 3.2 oz (69.9 kg)   SpO2 98%   BMI 21.51 kg/m   Gen: Pleasant, thin, in no distress,  normal affect  ENT: No lesions,  mouth clear,  oropharynx clear, no postnasal drip, edentulous  Neck: No JVD, no TMG, no carotid bruits, there is a left-sided firm neck mass at the end of the thyroid cartilage  Lungs: No use of accessory muscles, no dullness to percussion, distant breath sounds, few expired wheezes no rhonchi hyperresonance to percussion Cardiovascular: RRR, heart sounds normal, no murmur or gallops, no peripheral edema  Abdomen: soft and NT, no HSM,  BS normal  Musculoskeletal: No deformities, no cyanosis or clubbing  Neuro: alert, non focal  Skin: Warm, no lesions or rashes  Lab Results  Component Value Date   HGBA1C 6.0 (A) 07/29/2020        Assessment & Plan:  I personally reviewed all images and lab data in the [facility] system as well as any outside material available during this office visit and agree with the  radiology impressions.   Hypertension Hypertension poorly controlled and patient was off all medication  Take amlodipine 10 mg daily and valsartan HCT 160/25 daily and check labs  COPD with chronic  bronchitis (HCC) Continue with inhaled medication  History of primary laryngeal cancer We were able to get this patient Medicaid through the Medicaid office upstairs therefore we will make a referral to ENT for follow-up surveillance exam of the patient's laryngeal carcinoma that was treated with radiotherapy in the past  Hypothyroidism due to acquired atrophy of thyroid Assess thyroid levels on new medication  Skin lesion of face Concerned this lesion is malignant referral to plastic surgery made he now has Medicaid  Tobacco use disorder    Current smoking consumption amount: 1/2 pack a day  Dicsussion on advise to quit smoking and smoking impacts: Lung health  Patient's willingness to quit: Willing  Methods to quit smoking discussed: Psychiatry referral behavioral modification and nicotine replacement  Medication management of smoking session drugs discussed: Failed nicotine replacement topical patches will attempt to use nicotine lozenges 4 mg 3 times daily  Resources provided:  AVS   Setting quit date not yet set  Follow-up arranged 2 months   Time spent counseling the patient: 5 minutes    Keisha was seen today for medication refill and hypertension.  Diagnoses and all orders for this visit:  Hypothyroidism due to acquired atrophy of thyroid -     Thyroid Panel With TSH  Hypertension, unspecified type -     BMP8+eGFR -     Recheck vitals; Future  Skin lesion of face -     Ambulatory referral to Plastic Surgery  Pulmonary emphysema, unspecified emphysema type (HCC)  COPD with chronic bronchitis  History of primary laryngeal cancer  Tobacco use disorder  Other orders -     amLODipine (NORVASC) 10 MG tablet; Take 1 tablet (10 mg total) by mouth daily. -     valsartan-hydrochlorothiazide (DIOVAN-HCT) 160-12.5 MG tablet; Take 1 tablet by mouth daily.  30 minutes spent extra time needed assessing multiple problems and high degree of  complexity  Return in about 30 days (around 04/19/2023) for htn.

## 2023-03-20 ENCOUNTER — Encounter: Payer: Self-pay | Admitting: Critical Care Medicine

## 2023-03-20 ENCOUNTER — Telehealth: Payer: Self-pay

## 2023-03-20 ENCOUNTER — Ambulatory Visit: Payer: Self-pay | Admitting: Critical Care Medicine

## 2023-03-20 ENCOUNTER — Other Ambulatory Visit: Payer: Self-pay

## 2023-03-20 VITALS — BP 167/103 | HR 74 | Wt 154.2 lb

## 2023-03-20 DIAGNOSIS — F411 Generalized anxiety disorder: Secondary | ICD-10-CM | POA: Insufficient documentation

## 2023-03-20 DIAGNOSIS — J4489 Other specified chronic obstructive pulmonary disease: Secondary | ICD-10-CM | POA: Diagnosis not present

## 2023-03-20 DIAGNOSIS — L988 Other specified disorders of the skin and subcutaneous tissue: Secondary | ICD-10-CM | POA: Diagnosis not present

## 2023-03-20 DIAGNOSIS — T50916A Underdosing of multiple unspecified drugs, medicaments and biological substances, initial encounter: Secondary | ICD-10-CM | POA: Diagnosis not present

## 2023-03-20 DIAGNOSIS — Z85850 Personal history of malignant neoplasm of thyroid: Secondary | ICD-10-CM | POA: Diagnosis not present

## 2023-03-20 DIAGNOSIS — Z923 Personal history of irradiation: Secondary | ICD-10-CM | POA: Diagnosis not present

## 2023-03-20 DIAGNOSIS — Z597 Insufficient social insurance and welfare support: Secondary | ICD-10-CM | POA: Insufficient documentation

## 2023-03-20 DIAGNOSIS — Z8521 Personal history of malignant neoplasm of larynx: Secondary | ICD-10-CM

## 2023-03-20 DIAGNOSIS — F17210 Nicotine dependence, cigarettes, uncomplicated: Secondary | ICD-10-CM | POA: Insufficient documentation

## 2023-03-20 DIAGNOSIS — Z5900 Homelessness unspecified: Secondary | ICD-10-CM | POA: Insufficient documentation

## 2023-03-20 DIAGNOSIS — Z91120 Patient's intentional underdosing of medication regimen due to financial hardship: Secondary | ICD-10-CM | POA: Insufficient documentation

## 2023-03-20 DIAGNOSIS — L989 Disorder of the skin and subcutaneous tissue, unspecified: Secondary | ICD-10-CM

## 2023-03-20 DIAGNOSIS — E890 Postprocedural hypothyroidism: Secondary | ICD-10-CM | POA: Diagnosis not present

## 2023-03-20 DIAGNOSIS — J449 Chronic obstructive pulmonary disease, unspecified: Secondary | ICD-10-CM | POA: Diagnosis present

## 2023-03-20 DIAGNOSIS — I10 Essential (primary) hypertension: Secondary | ICD-10-CM | POA: Diagnosis not present

## 2023-03-20 DIAGNOSIS — E034 Atrophy of thyroid (acquired): Secondary | ICD-10-CM

## 2023-03-20 DIAGNOSIS — R202 Paresthesia of skin: Secondary | ICD-10-CM | POA: Diagnosis not present

## 2023-03-20 DIAGNOSIS — F17200 Nicotine dependence, unspecified, uncomplicated: Secondary | ICD-10-CM

## 2023-03-20 DIAGNOSIS — J439 Emphysema, unspecified: Secondary | ICD-10-CM

## 2023-03-20 MED ORDER — AMLODIPINE BESYLATE 10 MG PO TABS
10.0000 mg | ORAL_TABLET | Freq: Every day | ORAL | 2 refills | Status: DC
  Filled 2023-03-20: qty 90, 90d supply, fill #0

## 2023-03-20 MED ORDER — VALSARTAN-HYDROCHLOROTHIAZIDE 160-12.5 MG PO TABS
1.0000 | ORAL_TABLET | Freq: Every day | ORAL | 3 refills | Status: DC
  Filled 2023-03-20 – 2023-03-21 (×2): qty 90, 90d supply, fill #0

## 2023-03-20 NOTE — Telephone Encounter (Signed)
Patient went upstairs to Medicaid and got approved MID number is [id number] R patient would like a referral to Ear, Nose and Throat

## 2023-03-20 NOTE — Telephone Encounter (Signed)
Great news  ref sent for ent

## 2023-03-20 NOTE — Assessment & Plan Note (Signed)
We were able to get this patient Medicaid through the Medicaid office upstairs therefore we will make a referral to ENT for follow-up surveillance exam of the patient's laryngeal carcinoma that was treated with radiotherapy in the past

## 2023-03-20 NOTE — Addendum Note (Signed)
Addended by: [name] E on: 03/20/2023 12:51 PM   Modules accepted: Orders

## 2023-03-20 NOTE — Patient Instructions (Signed)
REferral to plastic surgery Dr Ulice Bold will be made Start Valsartan hct and amlodipine daily All other medications the same Labs today  Return 3 weeks blood pressure rn recheck See Dr Delford Field 5 weeks

## 2023-03-20 NOTE — Assessment & Plan Note (Signed)
Continue with inhaled medication 

## 2023-03-20 NOTE — Assessment & Plan Note (Signed)
Concerned this lesion is malignant referral to plastic surgery made he now has Medicaid

## 2023-03-20 NOTE — Assessment & Plan Note (Signed)
Hypertension poorly controlled and patient was off all medication  Take amlodipine 10 mg daily and valsartan HCT 160/25 daily and check labs

## 2023-03-20 NOTE — Assessment & Plan Note (Signed)
Assess thyroid levels on new medication

## 2023-03-20 NOTE — Assessment & Plan Note (Signed)
? ? ??   Current smoking consumption amount: 1/2 pack a day ? ?? Dicsussion on advise to quit smoking and smoking impacts: Lung health ? ?? Patient's willingness to quit: Willing ? ?? Methods to quit smoking discussed: Psychiatry referral behavioral modification and nicotine replacement ? ?? Medication management of smoking session drugs discussed: Failed nicotine replacement topical patches will attempt to use nicotine lozenges 4 mg 3 times daily ? ?? Resources provided:  AVS  ? ?? Setting quit date not yet set ? ?? Follow-up arranged 2 months ? ? ?Time spent counseling the patient: 5 minutes ? ?

## 2023-03-21 ENCOUNTER — Other Ambulatory Visit: Payer: Self-pay

## 2023-03-21 LAB — THYROID PANEL WITH TSH
Free Thyroxine Index: 3.1 (ref 1.2–4.9)
T3 Uptake Ratio: 33 % (ref 24–39)
T4, Total: 9.4 ug/dL (ref 4.5–12.0)
TSH: 0.155 u[IU]/mL — ABNORMAL LOW (ref 0.450–4.500)

## 2023-03-21 LAB — BMP8+EGFR
BUN/Creatinine Ratio: 19 (ref 9–20)
BUN: 19 mg/dL (ref 6–24)
CO2: 23 mmol/L (ref 20–29)
Calcium: 9.1 mg/dL (ref 8.7–10.2)
Chloride: 105 mmol/L (ref 96–106)
Creatinine, Ser: 1.01 mg/dL (ref 0.76–1.27)
Glucose: 85 mg/dL (ref 70–99)
Potassium: 3.6 mmol/L (ref 3.5–5.2)
Sodium: 144 mmol/L (ref 134–144)
eGFR: 86 mL/min/{1.73_m2} (ref >59)

## 2023-03-21 NOTE — Progress Notes (Signed)
Call pt labs normal thyroid normal  no med changes

## 2023-03-21 NOTE — Telephone Encounter (Signed)
Called patient and his daughter answered( On dpr), DOB was confirmed and she was made aware of the referral

## 2023-03-22 ENCOUNTER — Other Ambulatory Visit: Payer: Self-pay

## 2023-03-22 ENCOUNTER — Other Ambulatory Visit (HOSPITAL_COMMUNITY): Payer: Self-pay

## 2023-03-22 ENCOUNTER — Telehealth: Payer: Self-pay

## 2023-03-22 NOTE — Telephone Encounter (Signed)
Pt was called and is aware of results, DOB was confirmed.  ?

## 2023-03-22 NOTE — Telephone Encounter (Signed)
-----   Message from [name] sent at 03/21/2023  6:13 AM EDT ----- Call pt labs normal thyroid normal  no med changes

## 2023-04-09 ENCOUNTER — Other Ambulatory Visit: Payer: Self-pay

## 2023-04-10 ENCOUNTER — Ambulatory Visit: Payer: Medicaid Other

## 2023-04-18 ENCOUNTER — Encounter: Payer: Self-pay | Admitting: Plastic Surgery

## 2023-04-18 ENCOUNTER — Ambulatory Visit: Payer: Medicaid Other | Admitting: Plastic Surgery

## 2023-04-18 ENCOUNTER — Ambulatory Visit: Payer: Self-pay | Admitting: Critical Care Medicine

## 2023-04-18 VITALS — BP 107/72 | HR 91 | Ht 71.0 in | Wt 151.6 lb

## 2023-04-18 DIAGNOSIS — D485 Neoplasm of uncertain behavior of skin: Secondary | ICD-10-CM

## 2023-04-18 DIAGNOSIS — D489 Neoplasm of uncertain behavior, unspecified: Secondary | ICD-10-CM

## 2023-04-18 DIAGNOSIS — C44311 Basal cell carcinoma of skin of nose: Secondary | ICD-10-CM

## 2023-04-18 NOTE — Progress Notes (Deleted)
Subjective:    Patient ID: [name], male    DOB: December 26, 1965, 57 y.o.   MRN: [id number]  06/24/19 57 y.o.M here for f/u COPD Homelessnes This patient [place] and was seen previously in the clinic earlier in November by our physician assistant.  Documentation from that visit is as below.   Marissa Grosz is a 57 y.o. male here to establish care and as hospital follow up.  He went to ED for med RF.  Homeless.  Out of thyroid meds ~ 1 month before restarting thyroid meds on 05/13/2019.  Denies drinking much alcohol.  C/o paresthesias in fingers and toes not controlled on gabapentin 200mg  tid.  Also severe fatigue.  Poor breathing.  Still smoking but trying to cut back.  H/o thyroid CA s/p radiation.     GABA did not help   1. Paresthesias - Thyroid Panel With TSH - gabapentin (NEURONTIN) 300 MG capsule; Take 1 capsule (300 mg total) by mouth 3 (three) times daily.  Dispense: 90 capsule; Refill: 3 - Vitamin B12 - Folate   2. Hypothyroidism due to acquired atrophy of thyroid Will not adjust based on this reading bc he just resumed meds 10/20 - Thyroid Panel With TSH - levothyroxine (SYNTHROID) 175 MCG tablet; Take 1 tablet (175 mcg total) by mouth daily before breakfast. For thyroid hormone replacement  Dispense: 30 tablet; Refill: 2   3. Fatigue, unspecified type   4. Insomnia, unspecified type   - mirtazapine (REMERON) 7.5 MG tablet; Take 1 tablet (7.5 mg total) by mouth at bedtime. For sleep  Dispense: 30 tablet; Refill: 0 - traZODone (DESYREL) 150 MG tablet; Take 1 tablet (150 mg total) by mouth at bedtime as needed for sleep.  Dispense: 30 tablet; Refill: 3   5. Anxiety - hydrOXYzine (ATARAX/VISTARIL) 25 MG tablet; Take 1 tablet (25 mg total) by mouth 3 (three) times daily as needed for anxiety.  Dispense: 60 tablet; Refill: 0   6. Hypertension, unspecified type Not controlled-resume meds - metoprolol succinate (TOPROL-XL) 25 MG 24 hr  tablet; Take 1 tablet (25 mg total) by mouth daily. For high blood pressure  Dispense: 30 tablet; Refill: 2   7. Homeless I am - Ambulatory referral to Social Work      8. Shortness of breath -unclear if he has emphysema or COPD?;  Will add- - Fluticasone-Salmeterol (ADVAIR DISKUS) 250-50 MCG/DOSE AEPB; Inhale 1 puff into the lungs 2 (two) times daily.  Dispense: 1 each; Refill: 3  01/06/20 The patient states the insomnia has been improved the use of Remeron and trazodone.  He states hydroxyzine also has helped the anxiety levels.  He also is taking the metoprolol 25 mg daily and this has been helpful as well.  He is using the Advair as well at 1 puff twice a day and this is helped his breathing.  He has not had a recent chest x-ray.  The patient also resume the levothyroxine and note lab results in the last visit showed still hypothyroidism and part of this was he was out of medication.  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  He has not been seen in follow-up in some time.  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  Note the patient did formally use heroin but he has been free of substance  abuse for many years.  He also does not drink alcohol.  He is still smoking however a pack a day of cigarettes  The patient does have very mild dysphagia at this time Note B12 levels did come back low and these were checked to evaluate for paresthesias therefore we will need to r begin B12 supplementation  01/05/2020 Since the last visit in December 2020 the patient continues to smoke a half a pack a day. He is maintaining his blood pressure medications. He has had no further flareups of his lung disease but has run out of his inhalers. The patient's not been emergency room for his breathing since the last visit. He is living in a tent encampment time friend's home in  [place]. He still has some swallowing issues and is edentulous. He states his dentures did not fit him very well. He is able to maintain some nutrition. Note his weight is down to 141 with a BMI of 19.6  He was not able to achieve an ENT appointment to follow-up on prior history of vocal cord cancer status post radiation to the neck. The cancer center stated he would need to see an ENT for evaluation. The patient is requesting 90-day refills on medications. He has been compliant with his Synthroid. He needs follow-up labs on the Synthroid. He is yet to receive his Covid vaccine.  04/07/2020 Patient returns in follow-up for his COPD and hypertension.  He now has a trailer to live down.  He complains of increased shortness of breath and cough.  He is smoking still but less.  He did receive his Moderrna vaccine through a homebound vaccine program  07/29/2020 Patient seen return follow-up and notes increased shortness of breath increased cough thick black mucus also notes increased swelling in the left neck increased hoarseness and inability to swallow as effectively.  Prior history of vocal cord cancer status post radiation therapy.  He is also on a dry powdered inhaler Advair which is creating difficulty with his cough and his upper airway.  Note this patient still is homeless living in a tent environment.  He is looking for a Materna booster and he was already in the homebound vaccine program  10/11/21 Patient returns in follow-up has not been seen in over a year.  He no longer lives in a tent he is now living in housing in [place].  He has been out of all his medications for 5 months.  He has severe hypothyroidism from previous radiotherapy to the neck.  He still having difficulty swallowing.  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  He has troubles with swallowing.  He still has no insurance not able to see any  specialist.  He cannot get in with ENT because able cause him to pay out-of-pocket.  He has dry skin with some itching.  He still has paresthesias in the hands and feet.  His PHQ 9 and GAD-7 are extremely elevated.  The patient is not suicidal.  Chest CT scan at the last visit was negative except for emphysema.  Patient is still smoking a half a pack a day of cigarettes.  Blood pressure on arrival 115/84 and has been off metoprolol for 5 months.  11/2022 [place]  Hypothyroidism due to acquired atrophy of thyroid Just resumed meds 5/10 - levothyroxine (SYNTHROID) 175 MCG tablet; TAKE 1 TABLET (175 MCG TOTAL) BY MOUTH DAILY BEFORE BREAKFAST. FOR THYROID HORMONE REPLACEMENT  Dispense: 90 tablet; Refill: 0   2. Hoarseness of voice S/p #4 - Ambulatory referral to ENT   3. Groin pain, chronic, right Less and studies were unremarkable   4. 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 Dehydration Increase water intake 80-100 ounces water daily   6. Elevated liver function tests Avoid alcohol/acetaminophen   7. Vision changes Not acute-occurring over the last year - Ambulatory referral to Ophthalmology   8. 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  Blood pressure on arrival was elevated 167/103.  He has a lesion under his right eye that needs to be attended to.  There are no other complaints.  Breathing is at baseline.  04/18/23   Shortness of Breath This is a chronic problem. The current episode started more than 1 year ago. The problem occurs daily. The problem has been gradually worsening. Associated symptoms include a sore throat and sputum production. Pertinent negatives include no chest pain, ear pain, fever, headaches, hemoptysis, orthopnea, PND,  rhinorrhea, vomiting or wheezing. The symptoms are aggravated by smoke, odors, exercise and any activity. Associated symptoms comments: Mucus is clear  Voice gone Sore throat. Risk factors include smoking. He has tried beta agonist inhalers and steroid inhalers for the symptoms. The treatment provided mild relief. His past medical history is significant for COPD. There is no history of asthma, bronchiolitis, CAD, DVT, PE or pneumonia.   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   Vocal cord cancer (HCC) 05/01/2013     Family History  Problem Relation Age of Onset   COPD Mother    Emphysema Mother    Heart attack Father      Social History   Socioeconomic History   Marital status: Legally Separated    Spouse name: Not on file   Number of children: 3   Years of education: Not on file   Highest education level: Not on file  Occupational History    Employer: [employer]    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  Tobacco Use   Smoking status: Every Day    Current packs/day: 1.00    Average packs/day: 1 pack/day for 34.0 years (34.0 ttl pk-yrs)    Types: Cigarettes   Smokeless tobacco: Never   Tobacco comments:    DOWN TO 4 - 5 CIGARETTES A DAY  Vaping Use   Vaping status: Never Used  Substance and Sexual Activity   Alcohol use: No    Alcohol/week: 0.0 standard drinks of alcohol   Drug use: Not Currently    Types: Marijuana, Cocaine, Heroin    Comment: He hasn't used  drugs in "over a year"- 06/27/19  Sexual activity: Yes    Birth control/protection: Condom  Other Topics Concern   Not on file  Social History Narrative   The patient is married. Patient has 3 children. 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   The patient has a history of smoking 2 packs of cigarette a day for 34 years. Patient current is trying to quit smoking. Patient denies use of alcohol. Patient denies use of other illicit drugs.   Social Determinants of Health   Financial Resource Strain: Not on file  Food Insecurity: Not on file  Transportation Needs: No Transportation Needs (06/27/2019)   PRAPARE - Administrator, Civil Service (Medical): No    Lack of Transportation (Non-Medical): No  Physical Activity: Not on file  Stress: Not on file  Social Connections: Not on file  Intimate Partner Violence: Not At Risk (06/27/2019)   Humiliation, Afraid, Rape, and Kick questionnaire    Fear of Current or Ex-Partner: No    Emotionally Abused: No    Physically Abused: No    Sexually Abused: No     Allergies  Allergen Reactions   Fentanyl Nausea And Vomiting   Citalopram Other (See Comments)    Dizziness and shakiness.   Morphine And Codeine Nausea And Vomiting     Outpatient Medications Prior to Visit  Medication Sig Dispense Refill   albuterol (VENTOLIN HFA) 108 (90 Base) MCG/ACT inhaler Inhale 2 puffs into the lungs every 6 (six) hours as needed for wheezing or shortness of breath. 6.7 g 6   amLODipine (NORVASC) 10 MG tablet Take 1 tablet (10 mg total) by mouth daily. 90 tablet 2   budesonide-formoterol (SYMBICORT) 80-4.5 MCG/ACT inhaler Inhale 2 puffs into the lungs 2 (two) times daily. 10.3 g 3   fluticasone (FLONASE) 50 MCG/ACT nasal spray Place 2 sprays into both nostrils daily. 16 g 6   gabapentin (NEURONTIN) 300 MG capsule Take 1 capsule (300 mg total) by mouth 3 (three) times daily. 180 capsule 3   levothyroxine (SYNTHROID) 175 MCG tablet TAKE 1 TABLET  (175 MCG TOTAL) BY MOUTH DAILY BEFORE BREAKFAST. FOR THYROID HORMONE REPLACEMENT 90 tablet 0   valsartan-hydrochlorothiazide (DIOVAN-HCT) 160-12.5 MG tablet Take 1 tablet by mouth daily. 90 tablet 3   vitamin B-12 (CYANOCOBALAMIN) 1000 MCG tablet Take 1 tablet (1,000 mcg total) by mouth daily. 90 tablet 3   No facility-administered medications prior to visit.     Review of Systems  Constitutional:  Negative for fever.  HENT:  Positive for hearing loss, sore throat, trouble swallowing and voice change. Negative for ear pain and rhinorrhea.        Has fullness in the ears decreased hearing  Respiratory:  Positive for sputum production and shortness of breath. Negative for hemoptysis, choking, chest tightness and wheezing.        Throat pain  Cardiovascular:  Negative for chest pain, orthopnea and PND.  Gastrointestinal:  Negative for vomiting.       Gerd  Endocrine: Negative for polyuria.  Genitourinary: Negative.   Neurological:  Negative for headaches.  Psychiatric/Behavioral:  Negative for dysphoric mood, self-injury and suicidal ideas.        Objective:   Physical Exam Vitals reviewed.  Constitutional:      Appearance: Normal appearance. He is well-developed. He is not diaphoretic.     Comments: Thin  HENT:     Head: Normocephalic and atraumatic.     Right Ear: Tympanic membrane, ear canal and external ear normal. There  is no impacted cerumen.     Left Ear: Tympanic membrane, ear canal and external ear normal. There is no impacted cerumen.     Nose: No nasal deformity, septal deviation, mucosal edema, congestion or rhinorrhea.     Right Sinus: No maxillary sinus tenderness or frontal sinus tenderness.     Left Sinus: No maxillary sinus tenderness or frontal sinus tenderness.     Mouth/Throat:     Mouth: Mucous membranes are moist.     Pharynx: Oropharynx is clear. No oropharyngeal exudate or posterior oropharyngeal erythema.  Eyes:     General: No scleral icterus.     Conjunctiva/sclera: Conjunctivae normal.     Pupils: Pupils are equal, round, and reactive to light.  Neck:     Thyroid: No thyromegaly.     Vascular: No carotid bruit or JVD.     Trachea: Trachea normal. No tracheal tenderness or tracheal deviation.  Cardiovascular:     Rate and Rhythm: Normal rate and regular rhythm.     Chest Wall: PMI is not displaced.     Pulses: Normal pulses. No decreased pulses.     Heart sounds: Normal heart sounds, S1 normal and S2 normal. Heart sounds not distant. No murmur heard.    No systolic murmur is present.     No diastolic murmur is present.     No friction rub. No gallop. No S3 or S4 sounds.  Pulmonary:     Effort: Pulmonary effort is normal. No tachypnea, accessory muscle usage or respiratory distress.     Breath sounds: Normal breath sounds. No stridor. No decreased breath sounds, wheezing, rhonchi or rales.     Comments: Distant breath sounds Chest:     Chest wall: No tenderness.  Abdominal:     General: Bowel sounds are normal. There is no distension.     Palpations: Abdomen is soft. Abdomen is not rigid.     Tenderness: There is no abdominal tenderness. There is no guarding or rebound.  Musculoskeletal:        General: Normal range of motion.     Cervical back: Normal range of motion and neck supple. No edema, erythema or rigidity. No muscular tenderness. Normal range of motion.  Lymphadenopathy:     Head:     Right side of head: No submental or submandibular adenopathy.     Left side of head: No submental or submandibular adenopathy.     Cervical: No cervical adenopathy.  Skin:    General: Skin is warm and dry.     Coloration: Skin is not pale.     Findings: Lesion present. No erythema or rash.     Nails: There is no clubbing.     Comments: Lesion compatible with early basal cell carcinoma of the skin under the left I lid  Neurological:     Mental Status: He is alert and oriented to person, place, and time.     Sensory: No sensory  deficit.  Psychiatric:        Attention and Perception: Attention and perception normal.        Mood and Affect: Affect normal. Mood is depressed.        Speech: Speech normal.        Behavior: Behavior normal. Behavior is cooperative.        Thought Content: Thought content does not include homicidal or suicidal ideation. Thought content does not include homicidal or suicidal plan.        Cognition and Memory: Cognition  and memory normal.        Judgment: Judgment normal.     There were no vitals taken for this visit.  Gen: Pleasant, thin, in no distress,  normal affect  ENT: No lesions,  mouth clear,  oropharynx clear, no postnasal drip, edentulous  Neck: No JVD, no TMG, no carotid bruits, there is a left-sided firm neck mass at the end of the thyroid cartilage  Lungs: No use of accessory muscles, no dullness to percussion, distant breath sounds, few expired wheezes no rhonchi hyperresonance to percussion Cardiovascular: RRR, heart sounds normal, no murmur or gallops, no peripheral edema  Abdomen: soft and NT, no HSM,  BS normal  Musculoskeletal: No deformities, no cyanosis or clubbing  Neuro: alert, non focal  Skin: Warm, no lesions or rashes  Lab Results  Component Value Date   HGBA1C 6.0 (A) 07/29/2020        Assessment & Plan:  I personally reviewed all images and lab data in the [facility] system as well as any outside material available during this office visit and agree with the  radiology impressions.   No problem-specific Assessment & Plan notes found for this encounter.    There are no diagnoses linked to this encounter.    30 minutes spent extra time needed assessing multiple problems and high degree of complexity  No follow-ups on file.

## 2023-04-18 NOTE — Progress Notes (Signed)
Referring Provider Storm Frisk, MD [address] [place],  [place] [address]   CC:  Chief Complaint  Patient presents with   Advice Only      [name] is an 57 y.o. male.  HPI: [name] is a 57 year old male who is referred today for evaluation of the lesion at the lateral nasal wall at the medial canthus.  Patient states that the lesion has been there for approximately 2 years and has been growing slowly.  Recently has begun to drain it has become painful.  Allergies  Allergen Reactions   Fentanyl Nausea And Vomiting   Citalopram Other (See Comments)    Dizziness and shakiness.   Morphine And Codeine Nausea And Vomiting    Outpatient Encounter Medications as of 04/18/2023  Medication Sig   albuterol (VENTOLIN HFA) 108 (90 Base) MCG/ACT inhaler Inhale 2 puffs into the lungs every 6 (six) hours as needed for wheezing or shortness of breath.   amLODipine (NORVASC) 10 MG tablet Take 1 tablet (10 mg total) by mouth daily.   budesonide-formoterol (SYMBICORT) 80-4.5 MCG/ACT inhaler Inhale 2 puffs into the lungs 2 (two) times daily.   fluticasone (FLONASE) 50 MCG/ACT nasal spray Place 2 sprays into both nostrils daily.   gabapentin (NEURONTIN) 300 MG capsule Take 1 capsule (300 mg total) by mouth 3 (three) times daily.   levothyroxine (SYNTHROID) 175 MCG tablet TAKE 1 TABLET (175 MCG TOTAL) BY MOUTH DAILY BEFORE BREAKFAST. FOR THYROID HORMONE REPLACEMENT   valsartan-hydrochlorothiazide (DIOVAN-HCT) 160-12.5 MG tablet Take 1 tablet by mouth daily.   vitamin B-12 (CYANOCOBALAMIN) 1000 MCG tablet Take 1 tablet (1,000 mcg total) by mouth daily.   No facility-administered encounter medications on file as of 04/18/2023.     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   Vocal cord cancer (HCC) 05/01/2013    Past Surgical History:  Procedure Laterality Date   BALLOON DILATION N/A 12/30/2013   Procedure: BALLOON DILATION;  Surgeon: Charolett Bumpers, MD;  Location: [facility] ENDOSCOPY;  Service: Endoscopy;  Laterality: N/A;   DIRECT LARYNGOSCOPY Right 04/24/2013   Procedure: DIRECT LARYNGOSCOPY and BIOPSY OF TONGUE;  Surgeon: Osborn Coho, MD;  Location[facility] OR;  Service: ENT;  Laterality: Right;   ESOPHAGEAL MANOMETRY N/A 09/07/2014   Procedure: ESOPHAGEAL MANOMETRY (EM);  Surgeon: Charolett Bumpers, MD;  Location: [facility] ENDOSCOPY;  Service: Endoscopy;  Laterality: N/A;   ESOPHAGOGASTRODUODENOSCOPY (EGD) WITH PROPOFOL N/A 12/30/2013   Procedure: ESOPHAGOGASTRODUODENOSCOPY (EGD) WITH PROPOFOL;  Surgeon: Charolett Bumpers, MD;  Location: [facility] ENDOSCOPY;  Service: Endoscopy;  Laterality: N/A;   MICROLARYNGOSCOPY Right 04/24/2013   Procedure: MICROLARYNGOSCOPY and RIGHT VOCAL CORD BIOPSY;  Surgeon: Osborn Coho, MD;  Location: [facility] OR;  Service: ENT;  Laterality: Right;   MULTIPLE EXTRACTIONS WITH ALVEOLOPLASTY N/A 09/30/2013   Procedure: Extraction of tooth #'s 2,3,4,5,6,7,8,9,10,11,12,15,22,23,24,25,26,27,28 with alveoloplasty;  Surgeon: Charlynne Pander, DDS;  Location: [facility] ORS;  Service: Oral Surgery;  Laterality: N/A;   TONSILLECTOMY     as a child   [place] teeth     age 29    Family History  Problem Relation Age of Onset   COPD Mother    Emphysema Mother    Heart attack Father     Social History   Social History Narrative   The patient is married. Patient has 3 children. 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   The patient has a  history of smoking 2 packs of cigarette a day for 34 years. Patient current is trying to quit smoking. Patient denies use of alcohol. Patient denies use of other illicit drugs.     Review of Systems General: Denies fevers, chills, weight loss CV: Denies chest pain, shortness of breath, palpitations Skin: A 1 cm lesion which is painful and drains at the medial canthus  Physical Exam    04/18/2023    2:40 PM 03/20/2023    9:16 AM 12/14/2022    9:59 AM  Vitals with BMI  Height 5\' 11"     Weight 151 lbs 10 oz 154 lbs 3 oz   BMI 21.15    Systolic 107 167 010  Diastolic 72 103 90  Pulse 91 74     General:  No acute distress,  Alert and oriented, Non-Toxic, Normal speech and affect Integument patient has an ulcerated 1 cm lesion on the upper right nasal wall at the medial canthus.  There are no surrounding lesions.  Assessment/Plan Skin lesion: This appears to be a skin cancer.  Given the patient's pain I elected to excise the lesion for pathology.  I have explained to him that it is unlikely that the margins will be clear and that he will almost certainly require additional surgery to clear the margins however I felt that it was best to biopsy the lesion now and since I was biopsying it it was amenable to resection for treatment of his pain.  Procedure Note  Preoperative Dx: Ulcerated skin lesion right upper nasal wall  Postoperative Dx: Same  Procedure: Excision of skin lesion  Anesthesia: Lidocaine 1% with 1:100,000 epinephrine and 0.25% Sensorcaine   Indication for Procedure: Removal for treatment of pain and pathologic diagnosis  Description of Procedure: Risks and complications were explained to the patient including scarring and the need for additional surgery.  Consent was confirmed and the patient understands the risks and benefits.  The potential complications and alternatives were explained and the patient consents.  The patient expressed understanding the option of not having  the procedure and the risks of a scar.  Time out was called and all information was confirmed to be correct.    The area was prepped and drapped.  Local anesthetic was injected in the subcutaneous tissues.  After waiting for the local to take affect the entire lesion was excised.  After obtaining hemostasis, the surgical wound was closed with interrupted 5-0 Prolene sutures.  The surgical wound measured 1 cm.  A dressing was applied.  The patient was given instructions on how to care for the area and a follow up appointment.  [name] tolerated the procedure well and there were no complications. The specimen was sent to pathology.   Santiago Glad 04/18/2023, 5:07 PM

## 2023-04-23 ENCOUNTER — Encounter: Payer: Self-pay | Admitting: Surgical

## 2023-04-23 ENCOUNTER — Ambulatory Visit: Payer: Medicaid Other | Admitting: Surgical

## 2023-04-23 VITALS — BP 124/85 | HR 56 | Ht 71.0 in | Wt 152.0 lb

## 2023-04-23 DIAGNOSIS — D489 Neoplasm of uncertain behavior, unspecified: Secondary | ICD-10-CM

## 2023-04-23 DIAGNOSIS — Z9889 Other specified postprocedural states: Secondary | ICD-10-CM

## 2023-04-23 NOTE — Progress Notes (Signed)
57 year old male here for follow-up after excision of ulcerated skin lesion of right upper nasal wall with Dr. Ladona Ridgel on 04/18/2023.  He reports he is doing well.  He is not have any issues at this time.  Pathology has not yet available.  On exam right nasal wall/medial canthus incision is intact and appears to be healing well.  Prolene sutures noted.  Prolene sutures were removed.  There is some scabbing noted, but incision is intact and appears to be healing well.  There is some mild surrounding irritation but no cellulitic changes or active drainage.  A/P:  Recommend following up via telephone and 2 weeks to discuss pathology. Sutures are removed, patient tolerated this well. Recommend calling with any further questions or concerns

## 2023-04-24 ENCOUNTER — Other Ambulatory Visit: Payer: Self-pay

## 2023-04-24 LAB — DERMATOLOGY PATHOLOGY

## 2023-05-09 ENCOUNTER — Ambulatory Visit (INDEPENDENT_AMBULATORY_CARE_PROVIDER_SITE_OTHER): Payer: Medicaid Other | Admitting: Surgical

## 2023-05-09 DIAGNOSIS — D489 Neoplasm of uncertain behavior, unspecified: Secondary | ICD-10-CM | POA: Diagnosis not present

## 2023-05-09 DIAGNOSIS — C44311 Basal cell carcinoma of skin of nose: Secondary | ICD-10-CM | POA: Diagnosis not present

## 2023-05-09 NOTE — Progress Notes (Signed)
Patient is a 57 year old male here for virtual visit to discuss pathology results from his right upper nasal wall skin lesion excision with Dr. Ladona Ridgel 04/18/2023.  Pathology showed basal cell carcinoma, nodular pattern, ulcerated, free margins.  Discussed pathology with patient.  Patient was doing well. Discussed with patient referral for dermatology for routine skin checks and to establish care.  We discussed following up with our office as needed. All of his questions were answered to his content.  The patient gave consent to have this visit done by telemedicine / virtual visit, two identifiers were used to identify patient. This is also consent for access the chart and treat the patient via this visit. The patient is located in [place].  I, the provider, am at the office.  We spent 5 minutes together for the visit.  Joined by telephone.

## 2023-07-13 ENCOUNTER — Telehealth: Payer: Self-pay | Admitting: Critical Care Medicine

## 2023-07-13 ENCOUNTER — Other Ambulatory Visit: Payer: Self-pay

## 2023-07-13 ENCOUNTER — Other Ambulatory Visit: Payer: Self-pay | Admitting: Critical Care Medicine

## 2023-07-13 DIAGNOSIS — E034 Atrophy of thyroid (acquired): Secondary | ICD-10-CM

## 2023-07-13 MED ORDER — LEVOTHYROXINE SODIUM 175 MCG PO TABS
175.0000 ug | ORAL_TABLET | Freq: Every day | ORAL | 0 refills | Status: DC
Start: 2023-07-13 — End: 2023-08-21
  Filled 2023-07-13: qty 30, 30d supply, fill #0

## 2023-07-13 NOTE — Telephone Encounter (Signed)
 Medication refilled today in a separate refill encounter.

## 2023-07-13 NOTE — Telephone Encounter (Signed)
Medication Refill -  Most Recent Primary Care Visit:  Provider: [name] E  Department: [facility]  Visit Type: OFFICE VISIT  Date: 03/20/2023  Medication: levothyroxine (SYNTHROID) 175 MCG tablet   Has the patient contacted their pharmacy? No  Is this the correct pharmacy for this prescription? Yes  This is the patient's preferred pharmacy: [facility] [address] [place] [address] Phone: [phone] Fax: [phone]  Has the prescription been filled recently? Yes  Is the patient out of the medication? Yes  Has the patient been seen for an appointment in the last year OR does the patient have an upcoming appointment? Yes  Can we respond through MyChart? No  Agent: Please be advised that Rx refills may take up to 3 business days. We ask that you follow-up with your pharmacy.  Patient says he has not had meds in a week

## 2023-07-16 ENCOUNTER — Other Ambulatory Visit: Payer: Self-pay

## 2023-08-20 ENCOUNTER — Telehealth: Payer: Self-pay | Admitting: Family Medicine

## 2023-08-20 ENCOUNTER — Other Ambulatory Visit: Payer: Self-pay | Admitting: Critical Care Medicine

## 2023-08-20 ENCOUNTER — Other Ambulatory Visit: Payer: Self-pay | Admitting: Family Medicine

## 2023-08-20 ENCOUNTER — Other Ambulatory Visit: Payer: Self-pay

## 2023-08-20 DIAGNOSIS — E034 Atrophy of thyroid (acquired): Secondary | ICD-10-CM

## 2023-08-20 NOTE — Telephone Encounter (Signed)
Copied from CRM [id number]. Topic: Clinical - Medication Refill >> Aug 20, 2023  8:59 AM Payton Doughty wrote: Most Recent Primary Care Visit:  Provider: Storm Frisk  Department: [facility]  Visit Type: OFFICE VISIT  Date: 03/20/2023  Medication:  levothyroxine (SYNTHROID) 175 MCG tablet   Has the patient contacted their pharmacy? No Pt was a dr Delford Field pt, dr Delford Field has retired.  Pt has not had his med in a few days  Is this the correct pharmacy for this prescription? Yes If no, delete pharmacy and type the correct one.  This is the patient's preferred pharmacy:  [facility] [address] [place] [address] Phone: [phone] Fax: [phone]  [facility] [address] [place]), [place] - [address] [place] [address] Phone: [phone] Fax: [phone]   Has the prescription been filled recently? Yes  Is the patient out of the medication? Yes  Has the patient been seen for an appointment in the last year OR does the patient have an upcoming appointment? Yes  Can we respond through MyChart? No  Agent: Please be advised that Rx refills may take up to 3 business days. We ask that you follow-up with your pharmacy.

## 2023-08-20 NOTE — Telephone Encounter (Signed)
DISCARD msg already sent by pharm

## 2023-08-21 ENCOUNTER — Other Ambulatory Visit: Payer: Self-pay

## 2023-08-21 ENCOUNTER — Ambulatory Visit: Payer: Medicaid Other | Attending: Nurse Practitioner | Admitting: Nurse Practitioner

## 2023-08-21 ENCOUNTER — Encounter: Payer: Self-pay | Admitting: Nurse Practitioner

## 2023-08-21 VITALS — BP 114/72 | HR 72 | Resp 19 | Ht 71.0 in | Wt 149.2 lb

## 2023-08-21 DIAGNOSIS — E034 Atrophy of thyroid (acquired): Secondary | ICD-10-CM | POA: Diagnosis not present

## 2023-08-21 DIAGNOSIS — M65312 Trigger thumb, left thumb: Secondary | ICD-10-CM

## 2023-08-21 DIAGNOSIS — Z720 Tobacco use: Secondary | ICD-10-CM

## 2023-08-21 DIAGNOSIS — G8929 Other chronic pain: Secondary | ICD-10-CM

## 2023-08-21 DIAGNOSIS — E7800 Pure hypercholesterolemia, unspecified: Secondary | ICD-10-CM

## 2023-08-21 DIAGNOSIS — M5442 Lumbago with sciatica, left side: Secondary | ICD-10-CM | POA: Diagnosis not present

## 2023-08-21 DIAGNOSIS — I10 Essential (primary) hypertension: Secondary | ICD-10-CM

## 2023-08-21 DIAGNOSIS — J4489 Other specified chronic obstructive pulmonary disease: Secondary | ICD-10-CM

## 2023-08-21 DIAGNOSIS — R350 Frequency of micturition: Secondary | ICD-10-CM

## 2023-08-21 DIAGNOSIS — R7303 Prediabetes: Secondary | ICD-10-CM

## 2023-08-21 MED ORDER — MOMETASONE FURO-FORMOTEROL FUM 200-5 MCG/ACT IN AERO
2.0000 | INHALATION_SPRAY | Freq: Two times a day (BID) | RESPIRATORY_TRACT | 6 refills | Status: DC
  Filled 2023-08-21: qty 13, 30d supply, fill #0

## 2023-08-21 MED ORDER — LEVOTHYROXINE SODIUM 175 MCG PO TABS
175.0000 ug | ORAL_TABLET | Freq: Every day | ORAL | 1 refills | Status: DC
Start: 2023-08-21 — End: 2023-08-23
  Filled 2023-08-21: qty 30, 30d supply, fill #0

## 2023-08-21 MED ORDER — AMLODIPINE BESYLATE 5 MG PO TABS
5.0000 mg | ORAL_TABLET | Freq: Every day | ORAL | 1 refills | Status: DC
Start: 2023-08-21 — End: 2023-12-20
  Filled 2023-08-21: qty 90, 90d supply, fill #0

## 2023-08-21 MED ORDER — ALBUTEROL SULFATE HFA 108 (90 BASE) MCG/ACT IN AERS
2.0000 | INHALATION_SPRAY | Freq: Four times a day (QID) | RESPIRATORY_TRACT | 6 refills | Status: DC | PRN
  Filled 2023-08-21: qty 18, 25d supply, fill #0

## 2023-08-21 MED ORDER — PREGABALIN 50 MG PO CAPS
50.0000 mg | ORAL_CAPSULE | Freq: Three times a day (TID) | ORAL | 0 refills | Status: DC
  Filled 2023-08-21: qty 90, 30d supply, fill #0

## 2023-08-21 MED ORDER — FLUTICASONE PROPIONATE 50 MCG/ACT NA SUSP
2.0000 | Freq: Every day | NASAL | 6 refills | Status: DC
Start: 2023-08-21 — End: 2023-12-20
  Filled 2023-08-21: qty 16, 30d supply, fill #0

## 2023-08-21 NOTE — Progress Notes (Signed)
Assessment & Plan:  Taelor was seen today for medication refill.  Diagnoses and all orders for this visit:  Hypothyroidism due to acquired atrophy of thyroid Return in 4 weeks/repeat labs -     levothyroxine (SYNTHROID) 175 MCG tablet; TAKE 1 TABLET (175 MCG TOTAL) BY MOUTH DAILY BEFORE BREAKFAST. FOR THYROID HORMONE REPLACEMENT -     Thyroid Panel With TSH  Primary hypertension -     amLODipine (NORVASC) 5 MG tablet; Take 1 tablet (5 mg total) by mouth daily. -     CMP14+EGFR Continue all antihypertensives as prescribed.  Reminded to bring in blood pressure log for follow  up appointment.  RECOMMENDATIONS: DASH/Mediterranean Diets are healthier choices for HTN.    Trigger finger of left thumb -     Ambulatory referral to Hand Surgery  Chronic bilateral low back pain with left-sided sciatica -     Ambulatory referral to Physical Medicine Rehab -     pregabalin (LYRICA) 50 MG capsule; Take 1 capsule (50 mg total) by mouth 3 (three) times daily. FOR BACK PAIN, Foot pain -     DG Lumbar Spine Complete; Future Work on losing weight to help reduce back pain. May alternate with heat and ice application for pain relief. May also alternate with acetaminophen and Ibuprofen as prescribed for back pain. Other alternatives include massage, acupuncture and water aerobics.   Prediabetes -     Hemoglobin A1c  Urine frequency -     PSA  Pure hypercholesterolemia -     Lipid panel INSTRUCTIONS: Work on a low fat, heart healthy diet and participate in regular aerobic exercise program by working out at least 150 minutes per week; 5 days a week-30 minutes per day. Avoid red meat/beef/steak,  fried foods. junk foods, sodas, sugary drinks, unhealthy snacking, alcohol and smoking.  Drink at least 80 oz of water per day and monitor your carbohydrate intake daily.    COPD with chronic bronchitis (HCC) -     albuterol (VENTOLIN HFA) 108 (90 Base) MCG/ACT inhaler; Inhale 2 puffs into the lungs every 6  (six) hours as needed for wheezing or shortness of breath. -     fluticasone (FLONASE) 50 MCG/ACT nasal spray; Place 2 sprays into both nostrils daily. -     mometasone-formoterol (DULERA) 200-5 MCG/ACT AERO; Inhale 2 puffs into the lungs 2 (two) times daily.    Patient has been counseled on age-appropriate routine health concerns for screening and prevention. These are reviewed and up-to-date. Referrals have been placed accordingly. Immunizations are up-to-date or declined.    Subjective:   Chief Complaint  Patient presents with   Medication Refill    [name] 58 y.o. male presents to office today for follow up to Hypothyroidism He is a patient of Dr. 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   Hypothyroidism Thyroid level is not at goal. He has been out of his thyroid medication. Will resume today and repeat labs in 4 weeks.  Lab Results  Component Value Date   TSH 0.155 (L) 03/20/2023   T4TOTAL 9.4 03/20/2023    Notes trigger finger today with left thumb in a fixed flat position pressed next to left pointer finger.  Chronic Back pain He has chronic back pain with sciatica and numbness in left foot. 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 Gabapentin has been ineffecitve.     HTN Blood pressure is well controlled. He has not taken either one of his blood pressure medications (amlodipine or valsartan-hct) in several months.  BP Readings from Last 3 Encounters:  08/21/23 114/72  04/23/23 124/85  04/18/23 107/72    Review of Systems  Constitutional:  Negative for  fever, malaise/fatigue and weight loss.  HENT: Negative.  Negative for nosebleeds.   Eyes: Negative.  Negative for blurred vision, double vision and photophobia.  Respiratory: Negative.  Negative for cough and shortness of breath.   Cardiovascular: Negative.  Negative for chest pain, palpitations and leg swelling.  Gastrointestinal: Negative.  Negative for heartburn, nausea and vomiting.  Genitourinary:  Positive for urgency.  Musculoskeletal:  Positive for back pain and joint pain. Negative for myalgias.       SEE HPI  Neurological:  Positive for sensory change. Negative for dizziness, focal weakness, seizures and headaches.  Psychiatric/Behavioral: Negative.  Negative for suicidal ideas.     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   Vocal cord cancer (HCC) 05/01/2013    Past Surgical History:  Procedure Laterality Date   BALLOON DILATION N/A 12/30/2013   Procedure: BALLOON DILATION;  Surgeon: Charolett Bumpers, MD;  Location: [facility];  Service: Endoscopy;  Laterality: N/A;   DIRECT LARYNGOSCOPY Right 04/24/2013   Procedure: DIRECT LARYNGOSCOPY and BIOPSY OF TONGUE;  Surgeon: Osborn Coho, MD;  Location[facility] OR;  Service: ENT;  Laterality: Right;   ESOPHAGEAL MANOMETRY N/A 09/07/2014   Procedure: ESOPHAGEAL MANOMETRY (EM);  Surgeon: Charolett Bumpers, MD;  Location: [facility];  Service: Endoscopy;  Laterality: N/A;   ESOPHAGOGASTRODUODENOSCOPY (EGD) WITH  PROPOFOL N/A 12/30/2013   Procedure: ESOPHAGOGASTRODUODENOSCOPY (EGD) WITH PROPOFOL;  Surgeon: Charolett Bumpers, MD;  Location: [facility];  Service: Endoscopy;  Laterality: N/A;   MICROLARYNGOSCOPY Right 04/24/2013   Procedure: MICROLARYNGOSCOPY and RIGHT VOCAL CORD BIOPSY;  Surgeon: Osborn Coho, MD;  Location: [facility] OR;  Service: ENT;  Laterality: Right;   MULTIPLE EXTRACTIONS WITH ALVEOLOPLASTY N/A 09/30/2013   Procedure: Extraction of tooth #'s 2,3,4,5,6,7,8,9,10,11,12,15,22,23,24,25,26,27,28 with alveoloplasty;  Surgeon: Charlynne Pander, DDS;  Location: [facility];  Service: Oral Surgery;  Laterality: N/A;   TONSILLECTOMY     as a child   wisdon teeth     age 58    Family History  Problem Relation Age of Onset   COPD Mother    Emphysema Mother    Heart attack Father     Social History Reviewed with no changes to be made today.   Outpatient Medications Prior to Visit  Medication Sig Dispense Refill   albuterol (VENTOLIN HFA) 108 (90 Base) MCG/ACT inhaler Inhale 2 puffs into the lungs every 6 (six) hours as needed for wheezing or  shortness of breath. (Patient not taking: Reported on 08/21/2023) 6.7 g 6   amLODipine (NORVASC) 10 MG tablet Take 1 tablet (10 mg total) by mouth daily. (Patient not taking: Reported on 08/21/2023) 90 tablet 2   budesonide-formoterol (SYMBICORT) 80-4.5 MCG/ACT inhaler Inhale 2 puffs into the lungs 2 (two) times daily. (Patient not taking: Reported on 08/21/2023) 10.3 g 3   fluticasone (FLONASE) 50 MCG/ACT nasal spray Place 2 sprays into both nostrils daily. (Patient not taking: Reported on 08/21/2023) 16 g 6   gabapentin (NEURONTIN) 300 MG capsule Take 1 capsule (300 mg total) by mouth 3 (three) times daily. (Patient not taking: Reported on 08/21/2023) 180 capsule 3   levothyroxine (SYNTHROID) 175 MCG tablet TAKE 1 TABLET (175 MCG TOTAL) BY MOUTH DAILY BEFORE BREAKFAST. FOR THYROID HORMONE REPLACEMENT (Patient not taking: Reported on 08/21/2023) 30 tablet 0    valsartan-hydrochlorothiazide (DIOVAN-HCT) 160-12.5 MG tablet Take 1 tablet by mouth daily. (Patient not taking: Reported on 08/21/2023) 90 tablet 3   vitamin B-12 (CYANOCOBALAMIN) 1000 MCG tablet Take 1 tablet (1,000 mcg total) by mouth daily. (Patient not taking: Reported on 08/21/2023) 90 tablet 3   No facility-administered medications prior to visit.    Allergies  Allergen Reactions   Fentanyl Nausea And Vomiting   Citalopram Other (See Comments)    Dizziness and shakiness.   Morphine And Codeine Nausea And Vomiting       Objective:    BP 114/72   Pulse 72   Ht 5\' 11"  (1.803 m)   SpO2 99%   BMI 21.20 kg/m  Wt Readings from Last 3 Encounters:  04/23/23 152 lb (68.9 kg)  04/18/23 151 lb 9.6 oz (68.8 kg)  03/20/23 154 lb 3.2 oz (69.9 kg)    Physical Exam Vitals and nursing note reviewed.  Constitutional:      Appearance: He is well-developed.  HENT:     Head: Normocephalic and atraumatic.  Cardiovascular:     Rate and Rhythm: Normal rate and regular rhythm.     Heart sounds: Normal heart sounds. No murmur heard.    No friction rub. No gallop.  Pulmonary:     Effort: Pulmonary effort is normal. No tachypnea or respiratory distress.     Breath sounds: Normal breath sounds. No decreased breath sounds, wheezing, rhonchi or rales.  Chest:     Chest wall: No tenderness.  Abdominal:     General: Bowel sounds are normal.     Palpations: Abdomen is soft.  Musculoskeletal:        General: Normal range of motion.     Cervical back: Normal range of motion.  Skin:    General: Skin is warm and dry.  Neurological:     Mental Status: He is alert and oriented to person, place, and time.     Coordination: Coordination normal.  Psychiatric:        Behavior: Behavior normal. Behavior is cooperative.        Thought Content: Thought content normal.        Judgment: Judgment normal.          Patient has been counseled extensively about nutrition and exercise as well as the  importance of adherence with medications and regular follow-up. The patient was given clear instructions to go to ER or return to medical center if symptoms don't improve, worsen or new problems develop. The patient verbalized understanding.   Follow-up: Return for 4 weeks repeat tsh and see me in 2 months BP.   Shea Stakes  Meredeth Ide, FNP-BC [facility] [place], [place] [phone]   08/21/2023, 12:23 PM

## 2023-08-22 LAB — LIPID PANEL
Chol/HDL Ratio: 4.2 {ratio} (ref 0.0–5.0)
Cholesterol, Total: 131 mg/dL (ref 100–199)
HDL: 31 mg/dL — ABNORMAL LOW (ref >39)
LDL Chol Calc (NIH): 80 mg/dL (ref 0–99)
Triglycerides: 104 mg/dL (ref 0–149)
VLDL Cholesterol Cal: 20 mg/dL (ref 5–40)

## 2023-08-22 LAB — CMP14+EGFR
ALT: 8 [IU]/L (ref 0–44)
AST: 12 [IU]/L (ref 0–40)
Albumin: 4 g/dL (ref 3.8–4.9)
Alkaline Phosphatase: 88 [IU]/L (ref 44–121)
BUN/Creatinine Ratio: 19 (ref 9–20)
BUN: 18 mg/dL (ref 6–24)
Bilirubin Total: 0.3 mg/dL (ref 0.0–1.2)
CO2: 25 mmol/L (ref 20–29)
Calcium: 9.1 mg/dL (ref 8.7–10.2)
Chloride: 103 mmol/L (ref 96–106)
Creatinine, Ser: 0.95 mg/dL (ref 0.76–1.27)
Globulin, Total: 2.1 g/dL (ref 1.5–4.5)
Glucose: 89 mg/dL (ref 70–99)
Potassium: 4.1 mmol/L (ref 3.5–5.2)
Sodium: 141 mmol/L (ref 134–144)
Total Protein: 6.1 g/dL (ref 6.0–8.5)
eGFR: 92 mL/min/{1.73_m2} (ref >59)

## 2023-08-22 LAB — PSA: Prostate Specific Ag, Serum: 1.1 ng/mL (ref 0.0–4.0)

## 2023-08-22 LAB — THYROID PANEL WITH TSH
Free Thyroxine Index: 2.7 (ref 1.2–4.9)
T3 Uptake Ratio: 33 % (ref 24–39)
T4, Total: 8.3 ug/dL (ref 4.5–12.0)
TSH: 0.154 u[IU]/mL — ABNORMAL LOW (ref 0.450–4.500)

## 2023-08-22 LAB — HEMOGLOBIN A1C
Est. average glucose Bld gHb Est-mCnc: 123 mg/dL
Hgb A1c MFr Bld: 5.9 % — ABNORMAL HIGH (ref 4.8–5.6)

## 2023-08-23 ENCOUNTER — Other Ambulatory Visit: Payer: Self-pay | Admitting: Nurse Practitioner

## 2023-08-23 ENCOUNTER — Other Ambulatory Visit: Payer: Self-pay

## 2023-08-23 DIAGNOSIS — E034 Atrophy of thyroid (acquired): Secondary | ICD-10-CM

## 2023-08-23 MED ORDER — LEVOTHYROXINE SODIUM 150 MCG PO TABS
150.0000 ug | ORAL_TABLET | Freq: Every day | ORAL | 1 refills | Status: DC
  Filled 2023-08-23 – 2023-09-24 (×2): qty 30, 30d supply, fill #0
  Filled 2023-10-26 (×2): qty 30, 30d supply, fill #1

## 2023-08-24 ENCOUNTER — Other Ambulatory Visit: Payer: Self-pay

## 2023-08-24 ENCOUNTER — Telehealth: Payer: Self-pay

## 2023-08-24 NOTE — Telephone Encounter (Signed)
 Pt was called and is aware of results, DOB was confirmed.  ?

## 2023-08-24 NOTE — Telephone Encounter (Signed)
-----   Message from Claiborne Rigg sent at 08/23/2023  6:51 PM EST ----- Thyroid level is still low. Need to reduce thyroid medication dose. New script sent to pharmacy  A1c continues in prediabetes range.  Cholesterol levels improved. Continue all medications as prescribed.   Kidney, liver function and electrolytes are normal.    Normal prostate level

## 2023-08-24 NOTE — Telephone Encounter (Signed)
-----   Message from Claiborne Rigg sent at 08/23/2023  6:53 PM EST ----- Repeat thyroids levels in 4 weeks.

## 2023-08-24 NOTE — Telephone Encounter (Signed)
Pt was called and is aware of results, DOB was confirmed.  Appointment made

## 2023-08-31 ENCOUNTER — Other Ambulatory Visit: Payer: Self-pay

## 2023-09-04 ENCOUNTER — Other Ambulatory Visit: Payer: Self-pay

## 2023-09-17 ENCOUNTER — Ambulatory Visit
Admission: RE | Admit: 2023-09-17 | Discharge: 2023-09-17 | Disposition: A | Discharge status: Home / Self Care | Payer: Medicaid Other | Source: Ambulatory Visit | Attending: Nurse Practitioner | Admitting: Nurse Practitioner

## 2023-09-17 DIAGNOSIS — Z720 Tobacco use: Secondary | ICD-10-CM

## 2023-09-18 ENCOUNTER — Other Ambulatory Visit: Payer: Medicaid Other | Admitting: Nurse Practitioner

## 2023-09-24 ENCOUNTER — Other Ambulatory Visit: Payer: Self-pay

## 2023-10-08 ENCOUNTER — Other Ambulatory Visit: Payer: Self-pay | Admitting: Nurse Practitioner

## 2023-10-08 ENCOUNTER — Other Ambulatory Visit: Payer: Self-pay

## 2023-10-08 DIAGNOSIS — I7090 Unspecified atherosclerosis: Secondary | ICD-10-CM

## 2023-10-08 MED ORDER — ATORVASTATIN CALCIUM 10 MG PO TABS
10.0000 mg | ORAL_TABLET | Freq: Every day | ORAL | 3 refills | Status: DC
  Filled 2023-10-08: qty 90, 90d supply, fill #0

## 2023-10-19 ENCOUNTER — Ambulatory Visit: Payer: Medicaid Other | Admitting: Nurse Practitioner

## 2023-10-26 ENCOUNTER — Other Ambulatory Visit (HOSPITAL_COMMUNITY): Payer: Self-pay

## 2023-10-26 ENCOUNTER — Other Ambulatory Visit: Payer: Self-pay

## 2023-11-28 ENCOUNTER — Other Ambulatory Visit: Payer: Self-pay

## 2023-11-28 ENCOUNTER — Other Ambulatory Visit: Payer: Self-pay | Admitting: Nurse Practitioner

## 2023-11-28 DIAGNOSIS — E034 Atrophy of thyroid (acquired): Secondary | ICD-10-CM

## 2023-11-29 ENCOUNTER — Other Ambulatory Visit: Payer: Self-pay

## 2023-12-05 ENCOUNTER — Other Ambulatory Visit: Payer: Self-pay

## 2023-12-05 ENCOUNTER — Ambulatory Visit: Payer: Self-pay | Admitting: *Deleted

## 2023-12-05 ENCOUNTER — Other Ambulatory Visit: Payer: Self-pay | Admitting: Nurse Practitioner

## 2023-12-05 DIAGNOSIS — E034 Atrophy of thyroid (acquired): Secondary | ICD-10-CM

## 2023-12-05 MED ORDER — LEVOTHYROXINE SODIUM 150 MCG PO TABS
150.0000 ug | ORAL_TABLET | Freq: Every day | ORAL | 0 refills | Status: DC
  Filled 2023-12-05: qty 15, 15d supply, fill #0

## 2023-12-05 NOTE — Addendum Note (Signed)
 Addended by: Theotis Flake F on: 12/05/2023 12:54 PM   Modules accepted: Orders

## 2023-12-05 NOTE — Telephone Encounter (Signed)
  Chief Complaint: needs Thyroid  medication refill- missed lab appointment Symptoms: Patient states he was given new dosing and was supposed to check does 4 weeks out. Patient did not have transportation and now has not been able to get medication for 1 week. Patient would like to restart- he knows he will need to come for lab. Patient states he is having fatigue off medication  Frequency: chronic medication use  Disposition: [] ED /[] Urgent Care (no appt availability in office) / [] Appointment(In office/virtual)/ []  [place] [facility]/ [] Home Care/ [] Refused Recommended Disposition /[] [place] [facility]/ [x]  Follow-up with PCP Additional Notes: Refill request- patient wants to restart medication- he will get labs for recheck   Copied from CRM #[id number]. Topic: Clinical - Red Word Triage >> Dec 05, 2023  8:16 AM Elle L wrote: Red Word that prompted transfer to Nurse Triage: The patient is having extreme fatigue as he has been without his levothyroxine  (SYNTHROID ) 150 MCG tablet for a week. Reason for Disposition  [1] Caller has URGENT medicine question about med that PCP or specialist prescribed AND [2] triager unable to answer question  Answer Assessment - Initial Assessment Questions 1. NAME of MEDICINE: "What medicine(s) are you calling about?"     Levothyroxine  - patient missed appointment due to transportation- he will make sure he makes recheck lab 2. QUESTION: "What is your question?" (e.g., double dose of medicine, side effect)     Patient missed lab check and is now out of medication- he would like to restart and will have lab rechecked 3. PRESCRIBER: "Who prescribed the medicine?" Reason: if prescribed by specialist, call should be referred to that group.     PCP 4. SYMPTOMS: "Do you have any symptoms?" If Yes, ask: "What symptoms are you having?"  "How bad are the symptoms (e.g., mild, moderate, severe)     fatigue [facility]- [address] location  Protocols used: Medication  Question Call-A-AH

## 2023-12-05 NOTE — Telephone Encounter (Signed)
 Patient voiced that he is out of levothyroxine  (SYNTHROID ) 150. Voiced that he missed his appointments because he did not have a way to get to them. Patient given the phone number for obtaining transportation through his insurance. Advised that he can call this number with 3 days notice for any medical appointment.  Patient given an appointment for 12/20/2023. Courtesy refill on levothyroxine  (SYNTHROID ) 150 sent to patient pharmacy. Advised that he needs to attend appointment for further refills. Patient voiced understating.

## 2023-12-06 ENCOUNTER — Other Ambulatory Visit: Payer: Self-pay

## 2023-12-17 NOTE — Progress Notes (Unsigned)
 Subjective:    Patient ID: [name], male    DOB: 1966-07-02, 58 y.o.   MRN: [id number]  06/24/19 58 y.o.M here for f/u COPD Homelessnes This patient has [place] and was seen previously in the clinic earlier in November by our physician assistant.  Documentation from that visit is as below.   [name] is a 58 y.o. male here to establish care and as hospital follow up.  He went to ED for med RF.  Homeless.  Out of thyroid  meds ~ 1 month before restarting thyroid  meds on 05/13/2019.  Denies drinking much alcohol .  C/o paresthesias in fingers and toes not controlled on gabapentin  200mg  tid.  Also severe fatigue.  Poor breathing.  Still smoking but trying to cut back.  H/o thyroid  CA s/p radiation.     GABA did not help   1. Paresthesias - Thyroid  Panel With TSH - gabapentin  (NEURONTIN ) 300 MG capsule; Take 1 capsule (300 mg total) by mouth 3 (three) times daily.  Dispense: 90 capsule; Refill: 3 - Vitamin B12 - Folate   2. Hypothyroidism due to acquired atrophy of thyroid  Will not adjust based on this reading bc he just resumed meds 10/20 - Thyroid  Panel With TSH - levothyroxine  (SYNTHROID ) 175 MCG tablet; Take 1 tablet (175 mcg total) by mouth daily before breakfast. For thyroid  hormone replacement  Dispense: 30 tablet; Refill: 2   3. Fatigue, unspecified type   4. Insomnia, unspecified type   - mirtazapine  (REMERON ) 7.5 MG tablet; Take 1 tablet (7.5 mg total) by mouth at bedtime. For sleep  Dispense: 30 tablet; Refill: 0 - traZODone  (DESYREL ) 150 MG tablet; Take 1 tablet (150 mg total) by mouth at bedtime as needed for sleep.  Dispense: 30 tablet; Refill: 3   5. Anxiety - hydrOXYzine  (ATARAX /VISTARIL ) 25 MG tablet; Take 1 tablet (25 mg total) by mouth 3 (three) times daily as needed for anxiety.  Dispense: 60 tablet; Refill: 0   6. Hypertension, unspecified type Not controlled-resume meds - metoprolol  succinate (TOPROL -XL) 25 MG 24 hr  tablet; Take 1 tablet (25 mg total) by mouth daily. For high blood pressure  Dispense: 30 tablet; Refill: 2   7. Homeless I am - Ambulatory referral to Social Work      8. Shortness of breath -unclear if he has emphysema or COPD?;  Will add- - Fluticasone -Salmeterol (ADVAIR  DISKUS) 250-50 MCG/DOSE AEPB; Inhale 1 puff into the lungs 2 (two) times daily.  Dispense: 1 each; Refill: 3  01/06/20 The patient states the insomnia has been improved the use of Remeron  and trazodone .  He states hydroxyzine  also has helped the anxiety levels.  He also is taking the metoprolol  25 mg daily and this has been helpful as well.  He is using the Advair  as well at 1 puff twice a day and this is helped his breathing.  He has not had a recent chest x-ray.  The patient also resume the levothyroxine  and note lab results in the last visit showed still hypothyroidism and part of this was he was out of medication.  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  He has not been seen in follow-up in some time.  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  Note the patient did formally use heroin but he has been free of substance  abuse for many years.  He also does not drink alcohol .  He is still smoking however a pack a day of cigarettes  The patient does have very mild dysphagia at this time Note B12 levels did come back low and these were checked to evaluate for paresthesias therefore we will need to r begin B12 supplementation  01/05/2020 Since the last visit in December 2020 the patient continues to smoke a half a pack a day. He is maintaining his blood pressure medications. He has had no further flareups of his lung disease but has run out of his inhalers. The patient's not been emergency room for his breathing since the last visit. He is living in a tent encampment time friend's home in  [place]. He still has some swallowing issues and is edentulous. He states his dentures did not fit him very well. He is able to maintain some nutrition. Note his weight is down to 141 with a BMI of 19.6  He was not able to achieve an ENT appointment to follow-up on prior history of vocal cord cancer status post radiation to the neck. The cancer center stated he would need to see an ENT for evaluation. The patient is requesting 90-day refills on medications. He has been compliant with his Synthroid . He needs follow-up labs on the Synthroid . He is yet to receive his Covid vaccine.  04/07/2020 Patient returns in follow-up for his COPD and hypertension.  He now has a trailer to live down.  He complains of increased shortness of breath and cough.  He is smoking still but less.  He did receive his Moderrna vaccine through a homebound vaccine program  07/29/2020 Patient seen return follow-up and notes increased shortness of breath increased cough thick black mucus also notes increased swelling in the left neck increased hoarseness and inability to swallow as effectively.  Prior history of vocal cord cancer status post radiation therapy.  He is also on a dry powdered inhaler Advair  which is creating difficulty with his cough and his upper airway.  Note this patient still is homeless living in a tent environment.  He is looking for a Materna booster and he was already in the homebound vaccine program  10/11/21 Patient returns in follow-up has not been seen in over a year.  He no longer lives in a tent he is now living in housing in [place].  He has been out of all his medications for 5 months.  He has severe hypothyroidism from previous radiotherapy to the neck.  He still having difficulty swallowing.  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  He has troubles with swallowing.  He still has no insurance not able to see any  specialist.  He cannot get in with ENT because able cause him to pay out-of-pocket.  He has dry skin with some itching.  He still has paresthesias in the hands and feet.  His PHQ 9 and GAD-7 are extremely elevated.  The patient is not suicidal.  Chest CT scan at the last visit was negative except for emphysema.  Patient is still smoking a half a pack a day of cigarettes.  Blood pressure on arrival 115/84 and has been off metoprolol  for 5 months.  11/2022 [name]  Hypothyroidism due to acquired atrophy of thyroid  Just resumed meds 5/10 - levothyroxine  (SYNTHROID ) 175 MCG tablet; TAKE 1 TABLET (175 MCG TOTAL) BY MOUTH DAILY BEFORE BREAKFAST. FOR THYROID  HORMONE REPLACEMENT  Dispense: 90 tablet; Refill: 0   2. Hoarseness of voice S/p #4 - Ambulatory referral to ENT   3. Groin pain, chronic, right Less and studies were unremarkable   4. 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 Dehydration Increase water intake 80-100 ounces water daily   6. Elevated liver function tests Avoid alcohol /acetaminophen    7. Vision changes Not acute-occurring over the last year - Ambulatory referral to Ophthalmology   8. 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  Blood pressure on arrival was elevated 167/103.  He has a lesion under his right eye that needs to be attended to.  There are no other complaints.  Breathing is at baseline.  12/20/23 The patient seen in return follow-up he does have housing this time he is yet to see ENT for his laryngeal carcinoma blood pressure is good over 79.  He needs his thyroid  function followed up.  Needs medications refilled.  Had a recent CT scan of chest screening showing no significant problems recheck in a year.  He continues to smoke a  pack every 2 days.  Off cigarettes.  His thyroid  medicine has been adjusted recently and needs follow-up on the thyroid  function.  There are no other complaints his breathing is stable at this time.  Shortness of Breath This is a chronic problem. The current episode started more than 1 year ago. The problem occurs daily. The problem has been gradually worsening. Associated symptoms include a sore throat and sputum production. Pertinent negatives include no chest pain, ear pain, fever, headaches, hemoptysis, orthopnea, PND, rhinorrhea, vomiting or wheezing. The symptoms are aggravated by smoke, odors, exercise and any activity. Associated symptoms comments: Mucus is clear  Voice gone Sore throat. Risk factors include smoking. He has tried beta agonist inhalers and steroid inhalers for the symptoms. The treatment provided mild relief. His past medical history is significant for COPD. There is no history of asthma, bronchiolitis, CAD, DVT, PE or pneumonia.   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   Vocal cord cancer (HCC) 05/01/2013     Family History  Problem Relation Age of Onset   COPD Mother    Emphysema Mother    Heart attack Father      Social History   Socioeconomic History   Marital status: Legally Separated    Spouse name: Not on file   Number of children: 3   Years of education: Not on file   Highest education level: Not on file  Occupational History     Employer: [employer]    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  Tobacco Use   Smoking status: Every Day  Current packs/day: 1.00    Average packs/day: 1 pack/day for 34.0 years (34.0 ttl pk-yrs)    Types: Cigarettes   Smokeless tobacco: Never   Tobacco comments:    DOWN TO 4 - 5 CIGARETTES A DAY  Vaping Use   Vaping status: Never Used  Substance and Sexual Activity   Alcohol  use: No    Alcohol /week: 0.0 standard drinks of alcohol    Drug use: Not Currently    Types: Marijuana, Cocaine, Heroin    Comment: He hasn't used drugs in "over a year"- 06/27/19   Sexual activity: Yes    Birth control/protection: Condom  Other Topics Concern   Not on file  Social History Narrative   The patient is married. Patient has 3 children. 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   The patient has a history of smoking 2 packs of cigarette a day for 34 years. Patient current is trying to quit smoking. Patient denies use of alcohol . Patient denies use of other illicit drugs.   Social Drivers of Health   Financial Resource Strain: High Risk (12/20/2023)   Overall Financial Resource Strain (CARDIA)    Difficulty of Paying Living Expenses: Hard  Food Insecurity: Food Insecurity Present (12/20/2023)   Hunger Vital Sign    Worried About Running Out of Food in the Last Year: Sometimes true    Ran Out of Food in the Last Year: Sometimes true  Transportation Needs: Unmet Transportation Needs (12/20/2023)   PRAPARE - Administrator, Civil Service (Medical): Yes    Lack of Transportation (Non-Medical): Yes  Physical Activity: Not on file  Stress: Stress Concern Present (12/20/2023)   [name]    Feeling of Stress : To some extent  Social Connections: Socially Isolated (12/20/2023)   Social Connection and Isolation Panel [NHANES]    Frequency of Communication with Friends and Family: Twice a week    Frequency of Social Gatherings with  Friends and Family: Once a week    Attends Religious Services: Never    Database administrator or Organizations: No    Attends Banker Meetings: Never    Marital Status: Divorced  Catering manager Violence: Not At Risk (06/27/2019)   Humiliation, Afraid, Rape, and Kick Whitehead    Fear of Current or Ex-Partner: No    Emotionally Abused: No    Physically Abused: No    Sexually Abused: No     Allergies  Allergen Reactions   Fentanyl  Nausea And Vomiting   Citalopram Other (See Comments)    Dizziness and shakiness.   Morphine  And Codeine Nausea And Vomiting     Outpatient Medications Prior to Visit  Medication Sig Dispense Refill   albuterol  (VENTOLIN  HFA) 108 (90 Base) MCG/ACT inhaler Inhale 2 puffs into the lungs every 6 (six) hours as needed for wheezing or shortness of breath. 18 g 6   amLODipine  (NORVASC ) 5 MG tablet Take 1 tablet (5 mg total) by mouth daily. 90 tablet 1   atorvastatin  (LIPITOR) 10 MG tablet Take 1 tablet (10 mg total) by mouth daily. 90 tablet 3   fluticasone  (FLONASE ) 50 MCG/ACT nasal spray Place 2 sprays into both nostrils daily. 16 g 6   levothyroxine  (SYNTHROID ) 150 MCG tablet Take 1 tablet (150 mcg total) by mouth daily before breakfast. 15 tablet 0   mometasone -formoterol  (DULERA ) 200-5 MCG/ACT AERO Inhale 2 puffs into the lungs 2 (two) times daily. 13 g 6  pregabalin  (LYRICA ) 50 MG capsule Take 1 capsule (50 mg total) by mouth 3 (three) times daily. FOR BACK PAIN, Foot pain 90 capsule 0   No facility-administered medications prior to visit.     Review of Systems  Constitutional:  Negative for fever.  HENT:  Positive for hearing loss, sore throat, trouble swallowing and voice change. Negative for ear pain and rhinorrhea.        Has fullness in the ears decreased hearing  Respiratory:  Positive for sputum production and shortness of breath. Negative for hemoptysis, choking, chest tightness and wheezing.        Throat pain   Cardiovascular:  Negative for chest pain, orthopnea and PND.  Gastrointestinal:  Negative for vomiting.       Gerd  Endocrine: Negative for polyuria.  Genitourinary: Negative.   Neurological:  Negative for headaches.  Psychiatric/Behavioral:  Negative for dysphoric mood, self-injury and suicidal ideas.        Objective:   Physical Exam Vitals reviewed.  Constitutional:      Appearance: Normal appearance. He is well-developed. He is not diaphoretic.     Comments: Thin  HENT:     Head: Normocephalic and atraumatic.     Right Ear: Tympanic membrane, ear canal and external ear normal. There is no impacted cerumen.     Left Ear: Tympanic membrane, ear canal and external ear normal. There is no impacted cerumen.     Nose: No nasal deformity, septal deviation, mucosal edema, congestion or rhinorrhea.     Right Sinus: No maxillary sinus tenderness or frontal sinus tenderness.     Left Sinus: No maxillary sinus tenderness or frontal sinus tenderness.     Mouth/Throat:     Mouth: Mucous membranes are moist.     Pharynx: Oropharynx is clear. No oropharyngeal exudate or posterior oropharyngeal erythema.  Eyes:     General: No scleral icterus.    Conjunctiva/sclera: Conjunctivae normal.     Pupils: Pupils are equal, round, and reactive to light.  Neck:     Thyroid : No thyromegaly.     Vascular: No carotid bruit or JVD.     Trachea: Trachea normal. No tracheal tenderness or tracheal deviation.  Cardiovascular:     Rate and Rhythm: Normal rate and regular rhythm.     Chest Wall: PMI is not displaced.     Pulses: Normal pulses. No decreased pulses.     Heart sounds: Normal heart sounds, S1 normal and S2 normal. Heart sounds not distant. No murmur heard.    No systolic murmur is present.     No diastolic murmur is present.     No friction rub. No gallop. No S3 or S4 sounds.  Pulmonary:     Effort: Pulmonary effort is normal. No tachypnea, accessory muscle usage or respiratory distress.      Breath sounds: Normal breath sounds. No stridor. No decreased breath sounds, wheezing, rhonchi or rales.     Comments: Distant breath sounds Chest:     Chest wall: No tenderness.  Abdominal:     General: Bowel sounds are normal. There is no distension.     Palpations: Abdomen is soft. Abdomen is not rigid.     Tenderness: There is no abdominal tenderness. There is no guarding or rebound.  Musculoskeletal:        General: Normal range of motion.     Cervical back: Normal range of motion and neck supple. No edema, erythema or rigidity. No muscular tenderness. Normal range of motion.  Lymphadenopathy:     Head:     Right side of head: No submental or submandibular adenopathy.     Left side of head: No submental or submandibular adenopathy.     Cervical: No cervical adenopathy.  Skin:    General: Skin is warm and dry.     Coloration: Skin is not pale.     Findings: No erythema, lesion or rash.     Nails: There is no clubbing.  Neurological:     Mental Status: He is alert and oriented to person, place, and time.     Sensory: No sensory deficit.  Psychiatric:        Attention and Perception: Attention and perception normal.        Mood and Affect: Affect normal. Mood is depressed.        Speech: Speech normal.        Behavior: Behavior normal. Behavior is cooperative.        Thought Content: Thought content does not include homicidal or suicidal ideation. Thought content does not include homicidal or suicidal plan.        Cognition and Memory: Cognition and memory normal.        Judgment: Judgment normal.     BP 133/79   Pulse 65   Ht 5\' 11"  (1.803 m)   Wt 148 lb 6.4 oz (67.3 kg)   SpO2 99%   BMI 20.70 kg/m   Gen: Pleasant, thin, in no distress,  normal affect  ENT: No lesions,  mouth clear,  oropharynx clear, no postnasal drip, edentulous  Neck: No JVD, no TMG, no carotid bruits, there is a left-sided firm neck mass at the end of the thyroid  cartilage  Lungs: No use of  accessory muscles, no dullness to percussion, distant breath sounds, few expired wheezes no rhonchi hyperresonance to percussion Cardiovascular: RRR, heart sounds normal, no murmur or gallops, no peripheral edema  Abdomen: soft and NT, no HSM,  BS normal  Musculoskeletal: No deformities, no cyanosis or clubbing  Neuro: alert, non focal  Skin: Warm, no lesions or rashes  Lab Results  Component Value Date   HGBA1C 5.9 (H) 08/21/2023        Assessment & Plan:  I personally reviewed all images and lab data in the [facility] system as well as any outside material available during this office visit and agree with the  radiology impressions.   Hypertension Currently at goal continue with amlodipine   History of primary laryngeal cancer Now has insurance referral back to ENT  COPD with chronic bronchitis (HCC) Refill inhalers shown proper technique for HFA  Hypothyroidism due to acquired atrophy of thyroid  Reassess thyroid  function  Tobacco use disorder    Current smoking consumption amount: 1/2 pack a day  Dicsussion on advise to quit smoking and smoking impacts: Lung health  Patient's willingness to quit: Willing  Methods to quit smoking discussed: Psychiatry referral behavioral modification and nicotine  replacement  Medication management of smoking session drugs discussed: Failed nicotine  replacement topical patches will attempt to use nicotine  lozenges 4 mg 3 times daily  Resources provided:  AVS   Setting quit date not yet set  Follow-up arranged 2 months   Time spent counseling the patient: 5 minutes    Jamespaul was seen today for medical management of chronic issues.  Diagnoses and all orders for this visit:  History of primary laryngeal cancer -     Ambulatory referral to ENT  Primary hypertension -     amLODipine  (  NORVASC ) 5 MG tablet; Take 1 tablet (5 mg total) by mouth daily. -     CBC with Differential/Platelet -     Comprehensive metabolic panel with  GFR  Atherosclerosis -     atorvastatin  (LIPITOR) 10 MG tablet; Take 1 tablet (10 mg total) by mouth daily. -     Lipid panel -     Comprehensive metabolic panel with GFR  COPD with chronic bronchitis (HCC) -     fluticasone  (FLONASE ) 50 MCG/ACT nasal spray; Place 2 sprays into both nostrils daily. -     mometasone -formoterol  (DULERA ) 200-5 MCG/ACT AERO; Inhale 2 puffs into the lungs 2 (two) times daily.  Hypothyroidism due to acquired atrophy of thyroid  -     levothyroxine  (SYNTHROID ) 150 MCG tablet; Take 1 tablet (150 mcg total) by mouth daily before breakfast. -     Thyroid  Panel With TSH  Chronic bilateral low back pain with left-sided sciatica -     pregabalin  (LYRICA ) 50 MG capsule; Take 1 capsule (50 mg total) by mouth 3 (three) times daily. FOR BACK PAIN, Foot pain  Tobacco use disorder      30 minutes spent extra time needed assessing multiple problems and high degree of complexity  Return in about 6 months (around 06/21/2024) for primary care follow up, chronic conditions.

## 2023-12-20 ENCOUNTER — Encounter: Payer: Self-pay | Admitting: Critical Care Medicine

## 2023-12-20 ENCOUNTER — Other Ambulatory Visit: Payer: Self-pay

## 2023-12-20 ENCOUNTER — Ambulatory Visit: Attending: Critical Care Medicine | Admitting: Critical Care Medicine

## 2023-12-20 VITALS — BP 133/79 | HR 65 | Ht 71.0 in | Wt 148.4 lb

## 2023-12-20 DIAGNOSIS — F17210 Nicotine dependence, cigarettes, uncomplicated: Secondary | ICD-10-CM | POA: Diagnosis not present

## 2023-12-20 DIAGNOSIS — Z85850 Personal history of malignant neoplasm of thyroid: Secondary | ICD-10-CM | POA: Insufficient documentation

## 2023-12-20 DIAGNOSIS — C329 Malignant neoplasm of larynx, unspecified: Secondary | ICD-10-CM | POA: Insufficient documentation

## 2023-12-20 DIAGNOSIS — R059 Cough, unspecified: Secondary | ICD-10-CM | POA: Diagnosis not present

## 2023-12-20 DIAGNOSIS — E860 Dehydration: Secondary | ICD-10-CM | POA: Diagnosis not present

## 2023-12-20 DIAGNOSIS — R5383 Other fatigue: Secondary | ICD-10-CM | POA: Insufficient documentation

## 2023-12-20 DIAGNOSIS — E034 Atrophy of thyroid (acquired): Secondary | ICD-10-CM

## 2023-12-20 DIAGNOSIS — R202 Paresthesia of skin: Secondary | ICD-10-CM | POA: Insufficient documentation

## 2023-12-20 DIAGNOSIS — Z79890 Hormone replacement therapy (postmenopausal): Secondary | ICD-10-CM | POA: Insufficient documentation

## 2023-12-20 DIAGNOSIS — E890 Postprocedural hypothyroidism: Secondary | ICD-10-CM | POA: Insufficient documentation

## 2023-12-20 DIAGNOSIS — R1310 Dysphagia, unspecified: Secondary | ICD-10-CM | POA: Insufficient documentation

## 2023-12-20 DIAGNOSIS — Z5941 Food insecurity: Secondary | ICD-10-CM | POA: Insufficient documentation

## 2023-12-20 DIAGNOSIS — I10 Essential (primary) hypertension: Secondary | ICD-10-CM

## 2023-12-20 DIAGNOSIS — R7989 Other specified abnormal findings of blood chemistry: Secondary | ICD-10-CM | POA: Insufficient documentation

## 2023-12-20 DIAGNOSIS — Z79899 Other long term (current) drug therapy: Secondary | ICD-10-CM | POA: Insufficient documentation

## 2023-12-20 DIAGNOSIS — R1031 Right lower quadrant pain: Secondary | ICD-10-CM | POA: Insufficient documentation

## 2023-12-20 DIAGNOSIS — R490 Dysphonia: Secondary | ICD-10-CM | POA: Diagnosis not present

## 2023-12-20 DIAGNOSIS — M79673 Pain in unspecified foot: Secondary | ICD-10-CM | POA: Diagnosis not present

## 2023-12-20 DIAGNOSIS — Z923 Personal history of irradiation: Secondary | ICD-10-CM | POA: Insufficient documentation

## 2023-12-20 DIAGNOSIS — Z5971 Insufficient health insurance coverage: Secondary | ICD-10-CM | POA: Insufficient documentation

## 2023-12-20 DIAGNOSIS — M5442 Lumbago with sciatica, left side: Secondary | ICD-10-CM

## 2023-12-20 DIAGNOSIS — Z5902 Unsheltered homelessness: Secondary | ICD-10-CM | POA: Diagnosis not present

## 2023-12-20 DIAGNOSIS — I7090 Unspecified atherosclerosis: Secondary | ICD-10-CM | POA: Diagnosis not present

## 2023-12-20 DIAGNOSIS — Z8521 Personal history of malignant neoplasm of larynx: Secondary | ICD-10-CM

## 2023-12-20 DIAGNOSIS — G8929 Other chronic pain: Secondary | ICD-10-CM

## 2023-12-20 DIAGNOSIS — Z5982 Transportation insecurity: Secondary | ICD-10-CM | POA: Insufficient documentation

## 2023-12-20 DIAGNOSIS — F419 Anxiety disorder, unspecified: Secondary | ICD-10-CM | POA: Diagnosis not present

## 2023-12-20 DIAGNOSIS — J4489 Other specified chronic obstructive pulmonary disease: Secondary | ICD-10-CM | POA: Diagnosis not present

## 2023-12-20 DIAGNOSIS — F17200 Nicotine dependence, unspecified, uncomplicated: Secondary | ICD-10-CM

## 2023-12-20 DIAGNOSIS — M545 Low back pain, unspecified: Secondary | ICD-10-CM | POA: Diagnosis not present

## 2023-12-20 DIAGNOSIS — Z5986 Financial insecurity: Secondary | ICD-10-CM | POA: Insufficient documentation

## 2023-12-20 MED ORDER — ATORVASTATIN CALCIUM 10 MG PO TABS
10.0000 mg | ORAL_TABLET | Freq: Every day | ORAL | 3 refills | Status: DC
  Filled 2023-12-20: qty 90, 90d supply, fill #0

## 2023-12-20 MED ORDER — LEVOTHYROXINE SODIUM 150 MCG PO TABS
150.0000 ug | ORAL_TABLET | Freq: Every day | ORAL | 0 refills | Status: DC
  Filled 2023-12-20: qty 15, 15d supply, fill #0

## 2023-12-20 MED ORDER — FLUTICASONE PROPIONATE 50 MCG/ACT NA SUSP
2.0000 | Freq: Every day | NASAL | 6 refills | Status: AC
  Filled 2023-12-20: qty 16, 30d supply, fill #0

## 2023-12-20 MED ORDER — AMLODIPINE BESYLATE 5 MG PO TABS
5.0000 mg | ORAL_TABLET | Freq: Every day | ORAL | 1 refills | Status: DC
  Filled 2023-12-20: qty 90, 90d supply, fill #0

## 2023-12-20 MED ORDER — PREGABALIN 50 MG PO CAPS
50.0000 mg | ORAL_CAPSULE | Freq: Three times a day (TID) | ORAL | 0 refills | Status: DC
  Filled 2023-12-20 – 2024-01-01 (×2): qty 90, 30d supply, fill #0

## 2023-12-20 MED ORDER — MOMETASONE FURO-FORMOTEROL FUM 200-5 MCG/ACT IN AERO
2.0000 | INHALATION_SPRAY | Freq: Two times a day (BID) | RESPIRATORY_TRACT | 6 refills | Status: DC
  Filled 2023-12-20: qty 13, 30d supply, fill #0

## 2023-12-20 NOTE — Assessment & Plan Note (Signed)
 Currently at goal continue with amlodipine 

## 2023-12-20 NOTE — Assessment & Plan Note (Signed)
 Refill inhalers shown proper technique for HFA

## 2023-12-20 NOTE — Assessment & Plan Note (Signed)
Reassess thyroid function ?

## 2023-12-20 NOTE — Assessment & Plan Note (Signed)
 Now has insurance referral back to ENT

## 2023-12-20 NOTE — Assessment & Plan Note (Signed)
? ? ??   Current smoking consumption amount: 1/2 pack a day ? ?? Dicsussion on advise to quit smoking and smoking impacts: Lung health ? ?? Patient's willingness to quit: Willing ? ?? Methods to quit smoking discussed: Psychiatry referral behavioral modification and nicotine replacement ? ?? Medication management of smoking session drugs discussed: Failed nicotine replacement topical patches will attempt to use nicotine lozenges 4 mg 3 times daily ? ?? Resources provided:  AVS  ? ?? Setting quit date not yet set ? ?? Follow-up arranged 2 months ? ? ?Time spent counseling the patient: 5 minutes ? ?

## 2023-12-20 NOTE — Patient Instructions (Signed)
 Start nicotine  lozenge 4 mg dissolved in mouth 3 times a day and reduce tobacco intake  Labs will be obtained today will call results  All medications refilled  Referral back to ear nose and throat to follow-up on your vocal cord cancer [facility] ENT will be made  Return in 6 months for primary care follow-up

## 2023-12-21 ENCOUNTER — Ambulatory Visit: Payer: Self-pay | Admitting: Critical Care Medicine

## 2023-12-21 ENCOUNTER — Other Ambulatory Visit: Payer: Self-pay

## 2023-12-21 DIAGNOSIS — E034 Atrophy of thyroid (acquired): Secondary | ICD-10-CM

## 2023-12-21 LAB — COMPREHENSIVE METABOLIC PANEL WITH GFR
ALT: 9 IU/L (ref 0–44)
AST: 15 IU/L (ref 0–40)
Albumin: 4.1 g/dL (ref 3.8–4.9)
Alkaline Phosphatase: 84 IU/L (ref 44–121)
BUN/Creatinine Ratio: 23 — ABNORMAL HIGH (ref 9–20)
BUN: 25 mg/dL — ABNORMAL HIGH (ref 6–24)
Bilirubin Total: <0.2 mg/dL (ref 0.0–1.2)
CO2: 22 mmol/L (ref 20–29)
Calcium: 9.6 mg/dL (ref 8.7–10.2)
Chloride: 106 mmol/L (ref 96–106)
Creatinine, Ser: 1.07 mg/dL (ref 0.76–1.27)
Globulin, Total: 2 g/dL (ref 1.5–4.5)
Glucose: 64 mg/dL — ABNORMAL LOW (ref 70–99)
Potassium: 4.8 mmol/L (ref 3.5–5.2)
Sodium: 142 mmol/L (ref 134–144)
Total Protein: 6.1 g/dL (ref 6.0–8.5)
eGFR: 80 mL/min/{1.73_m2} (ref >59)

## 2023-12-21 LAB — CBC WITH DIFFERENTIAL/PLATELET
Basophils Absolute: 0 10*3/uL (ref 0.0–0.2)
Basos: 1 %
EOS (ABSOLUTE): 0.4 10*3/uL (ref 0.0–0.4)
Eos: 8 %
Hematocrit: 46.6 % (ref 37.5–51.0)
Hemoglobin: 15.4 g/dL (ref 13.0–17.7)
Immature Grans (Abs): 0 10*3/uL (ref 0.0–0.1)
Immature Granulocytes: 0 %
Lymphocytes Absolute: 1.3 10*3/uL (ref 0.7–3.1)
Lymphs: 28 %
MCH: 31.8 pg (ref 26.6–33.0)
MCHC: 33 g/dL (ref 31.5–35.7)
MCV: 96 fL (ref 79–97)
Monocytes Absolute: 0.4 10*3/uL (ref 0.1–0.9)
Monocytes: 9 %
Neutrophils Absolute: 2.5 10*3/uL (ref 1.4–7.0)
Neutrophils: 54 %
Platelets: 229 10*3/uL (ref 150–450)
RBC: 4.84 x10E6/uL (ref 4.14–5.80)
RDW: 13 % (ref 11.6–15.4)
WBC: 4.6 10*3/uL (ref 3.4–10.8)

## 2023-12-21 LAB — LIPID PANEL
Chol/HDL Ratio: 3.7 ratio (ref 0.0–5.0)
Cholesterol, Total: 172 mg/dL (ref 100–199)
HDL: 47 mg/dL (ref >39)
LDL Chol Calc (NIH): 112 mg/dL — ABNORMAL HIGH (ref 0–99)
Triglycerides: 67 mg/dL (ref 0–149)
VLDL Cholesterol Cal: 13 mg/dL (ref 5–40)

## 2023-12-21 LAB — THYROID PANEL WITH TSH
Free Thyroxine Index: 2.5 (ref 1.2–4.9)
T3 Uptake Ratio: 30 % (ref 24–39)
T4, Total: 8.2 ug/dL (ref 4.5–12.0)
TSH: 2.63 u[IU]/mL (ref 0.450–4.500)

## 2023-12-21 MED ORDER — LEVOTHYROXINE SODIUM 150 MCG PO TABS
150.0000 ug | ORAL_TABLET | Freq: Every day | ORAL | 2 refills | Status: AC
  Filled 2023-12-21 – 2024-01-01 (×2): qty 90, 90d supply, fill #0
  Filled 2024-04-28: qty 90, 90d supply, fill #1
  Filled 2024-08-11: qty 90, 90d supply, fill #2

## 2023-12-21 NOTE — Progress Notes (Signed)
 Let patient know all labs are normal thyroid  function at goal no change in thyroid  medication

## 2023-12-31 ENCOUNTER — Other Ambulatory Visit: Payer: Self-pay

## 2024-01-01 ENCOUNTER — Other Ambulatory Visit: Payer: Self-pay

## 2024-01-03 ENCOUNTER — Other Ambulatory Visit (HOSPITAL_COMMUNITY): Payer: Self-pay | Admitting: Otolaryngology

## 2024-01-03 DIAGNOSIS — T66XXXS Radiation sickness, unspecified, sequela: Secondary | ICD-10-CM

## 2024-01-03 DIAGNOSIS — J3801 Paralysis of vocal cords and larynx, unilateral: Secondary | ICD-10-CM

## 2024-01-03 DIAGNOSIS — Z8521 Personal history of malignant neoplasm of larynx: Secondary | ICD-10-CM

## 2024-01-07 ENCOUNTER — Other Ambulatory Visit: Payer: Self-pay

## 2024-02-01 ENCOUNTER — Other Ambulatory Visit: Payer: Self-pay

## 2024-02-01 MED ORDER — BISACODYL 5 MG PO TBEC
5.0000 mg | DELAYED_RELEASE_TABLET | ORAL | 0 refills | Status: AC
  Filled 2024-02-01: qty 4, 1d supply, fill #0

## 2024-02-01 MED ORDER — PEG 3350-KCL-NA BICARB-NACL 420 G PO SOLR
ORAL | 0 refills | Status: AC
  Filled 2024-02-01: qty 4000, 1d supply, fill #0

## 2024-02-06 LAB — HM COLONOSCOPY

## 2024-02-16 ENCOUNTER — Ambulatory Visit (HOSPITAL_BASED_OUTPATIENT_CLINIC_OR_DEPARTMENT_OTHER)

## 2024-02-16 ENCOUNTER — Encounter (HOSPITAL_BASED_OUTPATIENT_CLINIC_OR_DEPARTMENT_OTHER): Payer: Self-pay

## 2024-04-28 ENCOUNTER — Other Ambulatory Visit: Payer: Self-pay

## 2024-06-27 ENCOUNTER — Ambulatory Visit: Admitting: Internal Medicine

## 2024-08-11 ENCOUNTER — Other Ambulatory Visit: Payer: Self-pay

## 2024-08-12 ENCOUNTER — Other Ambulatory Visit: Payer: Self-pay

## 2024-08-21 ENCOUNTER — Other Ambulatory Visit: Payer: Self-pay

## 2024-08-21 ENCOUNTER — Ambulatory Visit: Attending: Internal Medicine | Admitting: Internal Medicine

## 2024-08-21 ENCOUNTER — Encounter: Payer: Self-pay | Admitting: Internal Medicine

## 2024-08-21 VITALS — BP 120/70 | HR 88 | Temp 98.3°F | Ht 71.0 in | Wt 144.0 lb

## 2024-08-21 DIAGNOSIS — I10 Essential (primary) hypertension: Secondary | ICD-10-CM | POA: Diagnosis not present

## 2024-08-21 DIAGNOSIS — Z5900 Homelessness unspecified: Secondary | ICD-10-CM

## 2024-08-21 DIAGNOSIS — Z122 Encounter for screening for malignant neoplasm of respiratory organs: Secondary | ICD-10-CM

## 2024-08-21 DIAGNOSIS — Z8521 Personal history of malignant neoplasm of larynx: Secondary | ICD-10-CM | POA: Diagnosis not present

## 2024-08-21 DIAGNOSIS — I7090 Unspecified atherosclerosis: Secondary | ICD-10-CM

## 2024-08-21 DIAGNOSIS — F17200 Nicotine dependence, unspecified, uncomplicated: Secondary | ICD-10-CM | POA: Diagnosis not present

## 2024-08-21 DIAGNOSIS — Z2821 Immunization not carried out because of patient refusal: Secondary | ICD-10-CM | POA: Diagnosis not present

## 2024-08-21 DIAGNOSIS — E034 Atrophy of thyroid (acquired): Secondary | ICD-10-CM | POA: Diagnosis not present

## 2024-08-21 DIAGNOSIS — J449 Chronic obstructive pulmonary disease, unspecified: Secondary | ICD-10-CM

## 2024-08-21 DIAGNOSIS — R490 Dysphonia: Secondary | ICD-10-CM | POA: Diagnosis not present

## 2024-08-21 DIAGNOSIS — K2100 Gastro-esophageal reflux disease with esophagitis, without bleeding: Secondary | ICD-10-CM

## 2024-08-21 DIAGNOSIS — R718 Other abnormality of red blood cells: Secondary | ICD-10-CM

## 2024-08-21 DIAGNOSIS — G4709 Other insomnia: Secondary | ICD-10-CM

## 2024-08-21 DIAGNOSIS — Z125 Encounter for screening for malignant neoplasm of prostate: Secondary | ICD-10-CM

## 2024-08-21 MED ORDER — ATORVASTATIN CALCIUM 10 MG PO TABS
10.0000 mg | ORAL_TABLET | Freq: Every day | ORAL | 1 refills | Status: AC
  Filled 2024-08-21: qty 90, 90d supply, fill #0

## 2024-08-21 MED ORDER — TRAZODONE HCL 50 MG PO TABS
25.0000 mg | ORAL_TABLET | Freq: Every evening | ORAL | 3 refills | Status: AC | PRN
  Filled 2024-08-21: qty 30, 30d supply, fill #0

## 2024-08-21 MED ORDER — MOMETASONE FURO-FORMOTEROL FUM 200-5 MCG/ACT IN AERO
2.0000 | INHALATION_SPRAY | Freq: Two times a day (BID) | RESPIRATORY_TRACT | 6 refills | Status: AC
  Filled 2024-08-21: qty 13, 30d supply, fill #0

## 2024-08-21 MED ORDER — ALBUTEROL SULFATE HFA 108 (90 BASE) MCG/ACT IN AERS
2.0000 | INHALATION_SPRAY | Freq: Four times a day (QID) | RESPIRATORY_TRACT | 6 refills | Status: AC | PRN
  Filled 2024-08-21: qty 6.7, 25d supply, fill #0

## 2024-08-21 MED ORDER — OMEPRAZOLE 20 MG PO CPDR
20.0000 mg | DELAYED_RELEASE_CAPSULE | Freq: Two times a day (BID) | ORAL | 1 refills | Status: AC
  Filled 2024-08-21: qty 180, 90d supply, fill #0

## 2024-08-21 NOTE — Patient Instructions (Signed)
" °  VISIT SUMMARY: [name], a 59 year old male with COPD and hypertension, came in for medication management and follow-up. He has a history of COPD, hypertension, hyperlipidemia, hypothyroidism, laryngeal cancer, and insomnia. He continues to smoke and is currently living in a garage without child psychotherapist assistance. He experiences shortness of breath, sleep maintenance issues, and intermittent hoarseness.  YOUR PLAN: -CHRONIC OBSTRUCTIVE PULMONARY DISEASE (COPD): COPD is a chronic lung disease that causes breathing difficulties. You were prescribed Dulera  inhaler for daily use twice a day and Ventolin  inhaler for use as needed. Please remember to rinse your mouth after using Dulera . A CT scan of your chest has been ordered for lung cancer screening.  -HYPOTHYROIDISM: Hypothyroidism is a condition where your thyroid  gland does not produce enough thyroid  hormone. You should continue taking levothyroxine  150 mg daily. A thyroid  function test has been ordered to check your levels.  -PRIMARY HYPERTENSION: Hypertension is high blood pressure. Your blood pressure is well-controlled without medication, so amlodipine  has been discontinued.  -HISTORY OF PRIMARY LARYNGEAL CANCER: Laryngeal cancer affects the voice box. You have been referred to an ENT specialist for follow-up due to intermittent hoarseness.  -TOBACCO USE DISORDER: Tobacco use disorder is an addiction to tobacco products. We discussed Chantix as an option for smoking cessation when you are ready to quit.  -INSOMNIA: Insomnia is difficulty falling or staying asleep. You were prescribed trazodone  to take at bedtime, starting with half a tablet. It is advised to turn off the TV before sleep and practice good sleep hygiene.  -HOMELESSNESS: You are currently living in a garage and do not have a child psychotherapist assisting you. A referral has been made to a child psychotherapist or case worker for housing assistance.  -GENERAL HEALTH MAINTENANCE: You  are due for lung cancer screening. A CT scan of your chest has been ordered for this purpose. You declined vaccines.  INSTRUCTIONS: Please follow up with the ENT specialist for your laryngeal cancer. Continue taking your medications as prescribed and follow the advice given for each condition. A social worker will be in touch to assist with housing. Ensure you get the CT scan of your chest as ordered for lung cancer screening.    Contains text generated by [employer].   "

## 2024-08-21 NOTE — Progress Notes (Addendum)
 "   Patient ID: [name], male    DOB: 12/26/1965  MRN: [id number]  CC: Hypothyroidism (Hypothyroidism f/u. Demaris, waking up multiple times at night X4-5 mo /No to all vax)   Subjective: [name] is a 59 y.o. male who presents for chronic ds management. PCP was Dr. Brien who has retired. His chronic medical issues include:  Pt with hx of HTN, COPD, hypothyroid, tob dep, SQ CA vocal cord (XRT 2014), MDD, homeless  Discussed the use of AI scribe software for clinical note transcription with the patient, who gave verbal consent to proceed.  History of Present Illness [name] is a 59 year old male with COPD and hypertension who presents for medication management and follow-up.  COPD/Tob: He has a history of COPD and uses inhalers regularly, but is currently out of Dulera  and Ventolin . He experiences shortness of breath, especially when walking, which is alleviated by the inhalers. He continues to smoke about 1-1.5 pks/day, reduced from two to three packs per day since his laryngeal cancer diagnosis in 2014. No increased cough is noted, but his voice fluctuates.  Reports chronic intermittent hoarseness since last yr. Saw ENT at [facility] 12/2023 and had laryngoscopy performed which did not reveal any signs of recurrent cancer. The right vocal cord had limited mobility, which could be a post-radiation effect, potentially causing hoarseness. Smoking and reflux can also contribute to hoarseness.  CT of neck was ordered but he did not have it done.  Referred to [facility] for possible reflux as contributing factor and was prescribed PPI but pt never got it.   - Today he reports that he does have acid reflux that is pretty bad.  He tries to avoid foods that makes it worse.  HTN: He has a history of hypertension and was previously on amlodipine  5 mg daily, but has been out of this medication for several months.  Blood pressure today looks good without it.  HL: He has hyperlipidemia and  aortic atherosclerosis and advanced calcification of the left anterior descending coronary artery seen on screening CT for lung cancer 1 year ago.  Was on atorvastatin  10 mg daily, but has been out of this medication for about a month.  Hypothyroid: He has hypothyroidism and is taking levothyroxine  150 mcg daily. His last thyroid  level check was in May of the previous year and was within normal limits.  Homeless: He lives in the back of a garage and has not worked since moving there about a year ago. He does not have a child psychotherapist or case worker assisting him with housing.  He experiences sleep maintenance issues, waking up every one to two hours throughout the night. He usually goes to bed around 9 or 10 PM and sleeps on a cot. He drinks coffee in the morning but no alcohol  or coffee in the evening. He often falls asleep with the TV on.  Sometimes he feels a nodule on the right upper abdomen.  It is not painful.  HM: declines flu, PCV 20 and shingrix    Patient Active Problem List   Diagnosis Date Noted   Skin lesion of face 03/20/2023   Groin pain, chronic, right 12/01/2022   Right inguinal pain 12/01/2022   Depression 12/01/2022   History of primary laryngeal cancer 08/27/2020   Prediabetes 07/29/2020   Neck mass 07/29/2020   COPD with chronic bronchitis (HCC) 06/24/2019   Hypertension 06/24/2019   Anxiety 06/24/2019   Paresthesias 06/24/2019   Insomnia 06/24/2019  Major depressive disorder, recurrent episode, severe (HCC) 08/14/2018   Homelessness 10/25/2016   Emphysema of lung (HCC) 12/18/2014   Esophageal dysphagia 09/10/2014   Tobacco use disorder 09/10/2014   Hypothyroidism due to acquired atrophy of thyroid  06/12/2014   GERD (gastroesophageal reflux disease) 06/12/2014   Hx of radiation therapy    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 05/06/2013   Hoarseness of voice 03/17/2013     Medications Ordered Prior to  Encounter[1]  Allergies[2]  Social History   Socioeconomic History   Marital status: Legally Separated    Spouse name: Not on file   Number of children: 3   Years of education: Not on file   Highest education level: Not on file  Occupational History    Employer: [employer]    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  Tobacco Use   Smoking status: Every Day    Current packs/day: 1.00    Average packs/day: 1 pack/day for 34.0 years (34.0 ttl pk-yrs)    Types: Cigarettes   Smokeless tobacco: Never   Tobacco comments:    DOWN TO 4 - 5 CIGARETTES A DAY  Vaping Use   Vaping status: Never Used  Substance and Sexual Activity   Alcohol  use: No    Alcohol /week: 0.0 standard drinks of alcohol    Drug use: Not Currently    Types: Marijuana, Cocaine, Heroin    Comment: He hasn't used drugs in over a year- 06/27/19   Sexual activity: Yes    Birth control/protection: Condom  Other Topics Concern   Not on file  Social History Narrative   The patient is married. Patient has 3 children. 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   The patient has a history of smoking 2 packs of cigarette a day for 34 years. Patient current is trying to quit smoking. Patient denies use of alcohol . Patient denies use of other illicit drugs.   Social Drivers of Health   Tobacco Use: High Risk (01/01/2024)   Received from [facility]   Patient History    Smoking Tobacco Use: Every Day    Smokeless Tobacco Use: Never    Passive Exposure: Not on file  Financial Resource Strain: High Risk (12/20/2023)   Overall Financial Resource Strain (CARDIA)    Difficulty of Paying Living Expenses: Hard  Food Insecurity: Food Insecurity Present (12/20/2023)   Hunger Vital Sign    Worried About Running Out of Food in the Last Year: Sometimes true    Ran Out of Food in the Last Year: Sometimes true  Transportation Needs: Unmet Transportation Needs (12/20/2023)   PRAPARE - Transportation    Lack of Transportation (Medical): Yes     Lack of Transportation (Non-Medical): Yes  Physical Activity: Not on file  Stress: Stress Concern Present (12/20/2023)   [employer] of Occupational Health - Occupational Stress Questionnaire    Feeling of Stress : To some extent  Social Connections: Socially Isolated (12/20/2023)   Social Connection and Isolation Panel    Frequency of Communication with Friends and Family: Twice a week    Frequency of Social Gatherings with Friends and Family: Once a week    Attends Religious Services: Never    Database Administrator or Organizations: No    Attends Banker Meetings: Never    Marital Status: Divorced  Catering Manager Violence: Not on file  Depression (PHQ2-9): High Risk (12/20/2023)   Depression (PHQ2-9)    PHQ-2 Score: 11  Alcohol   Screen: Low Risk (12/20/2023)   Alcohol  Screen    Last Alcohol  Screening Score (AUDIT): 0  Housing: High Risk (12/20/2023)   Housing Stability Vital Sign    Unable to Pay for Housing in the Last Year: Yes    Number of Times Moved in the Last Year: Not on file    Homeless in the Last Year: Yes  Utilities: Not At Risk (12/20/2023)   AHC Utilities    Threatened with loss of utilities: No  Health Literacy: Adequate Health Literacy (12/20/2023)   B1300 Health Literacy    Frequency of need for help with medical instructions: Never    Family History  Problem Relation Age of Onset   COPD Mother    Emphysema Mother    Heart attack Father     Past Surgical History:  Procedure Laterality Date   BALLOON DILATION N/A 12/30/2013   Procedure: BALLOON DILATION;  Surgeon: Gladis MARLA Louder, MD;  Location: [facility];  Service: Endoscopy;  Laterality: N/A;   DIRECT LARYNGOSCOPY Right 04/24/2013   Procedure: DIRECT LARYNGOSCOPY and BIOPSY OF TONGUE;  Surgeon: Alm Bouche, MD;  Location: [facility] OR;  Service: ENT;  Laterality: Right;   ESOPHAGEAL MANOMETRY N/A 09/07/2014   Procedure: ESOPHAGEAL MANOMETRY (EM);  Surgeon: Gladis MARLA Louder, MD;  Location:  [facility];  Service: Endoscopy;  Laterality: N/A;   ESOPHAGOGASTRODUODENOSCOPY (EGD) WITH PROPOFOL  N/A 12/30/2013   Procedure: ESOPHAGOGASTRODUODENOSCOPY (EGD) WITH PROPOFOL ;  Surgeon: Gladis MARLA Louder, MD;  Location: [facility];  Service: Endoscopy;  Laterality: N/A;   MICROLARYNGOSCOPY Right 04/24/2013   Procedure: MICROLARYNGOSCOPY and RIGHT VOCAL CORD BIOPSY;  Surgeon: Alm Bouche, MD;  Location: [facility] OR;  Service: ENT;  Laterality: Right;   MULTIPLE EXTRACTIONS WITH ALVEOLOPLASTY N/A 09/30/2013   Procedure: Extraction of tooth #'s 2,3,4,5,6,7,8,9,10,11,12,15,22,23,24,25,26,27,28 with alveoloplasty;  Surgeon: Tanda JULIANNA Fanny, DDS;  Location: [facility];  Service: Oral Surgery;  Laterality: N/A;   TONSILLECTOMY     as a child   wisdon teeth     age 72    ROS: Review of Systems Negative except as stated above  PHYSICAL EXAM: BP 120/70 (BP Location: Left Arm, Patient Position: Sitting, Cuff Size: Normal)   Pulse 88   Temp 98.3 F (36.8 C) (Oral)   Ht 5' 11 (1.803 m)   Wt 144 lb (65.3 kg)   SpO2 99%   BMI 20.08 kg/m   Wt Readings from Last 3 Encounters:  08/21/24 144 lb (65.3 kg)  12/20/23 148 lb 6.4 oz (67.3 kg)  08/21/23 149 lb 3.2 oz (67.7 kg)    Physical Exam   General appearance - alert slightly unkept older caucasian male in NAD. Has mild hoarsness Mental status - normal mood, behavior, speech, dress, motor activity, and thought processes Neck - supple, no significant adenopathy Chest - clear to auscultation, no wheezes, rales or rhonchi, symmetric air entry Heart - normal rate, regular rhythm, normal S1, S2, no murmurs, rubs, clicks or gallops Abdomen - soft, nontender, nondistended, no masses or organomegaly. A did not appreciate any hernia or subcut nodules and pt had difficulty finding it as well Extremities - no LE edema     Latest Ref Rng & Units 12/20/2023    9:39 AM 08/21/2023   11:37 AM 03/20/2023    9:43 AM  CMP  Glucose 70 - 99 mg/dL 64  89  85   BUN  6 - 24 mg/dL 25  18  19    Creatinine 0.76 - 1.27 mg/dL 8.92  9.04  8.98  Sodium 134 - 144 mmol/L 142  141  144   Potassium 3.5 - 5.2 mmol/L 4.8  4.1  3.6   Chloride 96 - 106 mmol/L 106  103  105   CO2 20 - 29 mmol/L 22  25  23    Calcium  8.7 - 10.2 mg/dL 9.6  9.1  9.1   Total Protein 6.0 - 8.5 g/dL 6.1  6.1    Total Bilirubin 0.0 - 1.2 mg/dL <9.7  0.3    Alkaline Phos 44 - 121 IU/L 84  88    AST 0 - 40 IU/L 15  12    ALT 0 - 44 IU/L 9  8     Lipid Panel     Component Value Date/Time   CHOL 172 12/20/2023 0939   TRIG 67 12/20/2023 0939   HDL 47 12/20/2023 0939   CHOLHDL 3.7 12/20/2023 0939   CHOLHDL 4.6 08/11/2018 0640   VLDL 24 08/11/2018 0640   LDLCALC 112 (H) 12/20/2023 0939    CBC    Component Value Date/Time   WBC 4.6 12/20/2023 0939   WBC 4.2 11/27/2022 0927   RBC 4.84 12/20/2023 0939   RBC 3.49 (L) 11/27/2022 0927   HGB 15.4 12/20/2023 0939   HGB 12.3 (L) 03/18/2014 1406   HCT 46.6 12/20/2023 0939   HCT 37.7 (L) 03/18/2014 1406   PLT 229 12/20/2023 0939   MCV 96 12/20/2023 0939   MCV 97.1 03/18/2014 1406   MCH 31.8 12/20/2023 0939   MCH 34.7 (H) 11/27/2022 0927   MCHC 33.0 12/20/2023 0939   MCHC 33.7 11/27/2022 0927   RDW 13.0 12/20/2023 0939   RDW 14.7 (H) 03/18/2014 1406   LYMPHSABS 1.3 12/20/2023 0939   LYMPHSABS 1.3 03/18/2014 1406   MONOABS 0.2 11/27/2022 0927   MONOABS 0.3 03/18/2014 1406   EOSABS 0.4 12/20/2023 0939   BASOSABS 0.0 12/20/2023 0939   BASOSABS 0.0 03/18/2014 1406    ASSESSMENT AND PLAN: 1. Chronic obstructive pulmonary disease, unspecified COPD type (HCC) (Primary) Refill given on albuterol  and Dulera .  Strongly advised to quit smoking. - CBC - mometasone -formoterol  (DULERA ) 200-5 MCG/ACT AERO; Inhale 2 puffs into the lungs 2 (two) times daily.  Dispense: 13 g; Refill: 6 - albuterol  (VENTOLIN  HFA) 108 (90 Base) MCG/ACT inhaler; Inhale 2 puffs into the lungs every 6 (six) hours as needed for wheezing or shortness of breath.   Dispense: 6.7 g; Refill: 6  2. Homelessness Soc Determinants of Health referral placed.  3. Primary hypertension Controlled off of medications.  Will not restart amlodipine .  DASH diet encouraged. - Comprehensive metabolic panel with GFR  4. Atherosclerosis - atorvastatin  (LIPITOR) 10 MG tablet; Take 1 tablet (10 mg total) by mouth daily.  Dispense: 90 tablet; Refill: 1 - Lipid panel  5. Hypothyroidism due to acquired atrophy of thyroid  Continue levothyroxine  at current dose.  Check TSH today. - TSH-future  6. History of primary laryngeal cancer 7. Hoarseness of voice Will get him back in with his ENT at [facility] for f/u - Ambulatory referral to ENT  8. Gastroesophageal reflux disease with esophagitis without hemorrhage GERD precautions discussed.  Advised to avoid certain foods like spicy foods, tomato-based foods, juices and excessive caffeine.  Advised to eat his last meal at least 2 to 3 hours before laying down at nights and to sleep with his head slightly elevated.   - Prescription sent to his pharmacy for omeprazole .  9. Tobacco use disorder Strongly advised to quit.  Patient not ready to  quit completely.  We discussed methods to help him quit when he is ready to give a trial of quitting.  10. Other insomnia Good sleep hygiene discussed and encouraged. Patient advised not to drink any caffeinated beverages or excessive alcohol  use within several hours of bedtime.  Advised to get in bed around about the same time every night.  Once in bed, turn off all lights and sounds.  If unable to fall asleep within 30 to 45 minutes of getting in bed, patient should get up and try to do something until he feels sleepy again.  At that time try getting back in bed. -Will give trail of Trazodone  which he thinks he was on before - traZODone  (DESYREL ) 50 MG tablet; Take 0.5-1 tablets (25-50 mg total) by mouth at bedtime as needed for sleep.  Dispense: 30 tablet; Refill: 3  11. Screening for lung  cancer - CT CHEST LUNG CA SCREEN LOW DOSE W/O CM; Future  12. Influenza vaccination declined 13. Pneumococcal vaccination declined 14. Herpes zoster vaccination declined Recommended. Pt declined  15. Prostate cancer screening - PSA  Addendum 08/22/24: Patient with elevated hematocrit on CBC.  Will add ferritin and transferrin percent to labs.  Patient was given the opportunity to ask questions.  Patient verbalized understanding of the plan and was able to repeat key elements of the plan.   This documentation was completed using Paediatric nurse.  Any transcriptional errors are unintentional.  No orders of the defined types were placed in this encounter.    Requested Prescriptions   Pending Prescriptions Disp Refills   pregabalin  (LYRICA ) 50 MG capsule 90 capsule 1    Sig: Take 1 capsule (50 mg total) by mouth 3 (three) times daily. FOR BACK PAIN, Foot pain   amLODipine  (NORVASC ) 5 MG tablet 90 tablet 1    Sig: Take 1 tablet (5 mg total) by mouth daily.   atorvastatin  (LIPITOR) 10 MG tablet 90 tablet 1    Sig: Take 1 tablet (10 mg total) by mouth daily.    No follow-ups on file.  Barnie Louder, MD, FACP     [1]  Current Outpatient Medications on File Prior to Visit  Medication Sig Dispense Refill   albuterol  (VENTOLIN  HFA) 108 (90 Base) MCG/ACT inhaler Inhale 2 puffs into the lungs every 6 (six) hours as needed for wheezing or shortness of breath. 18 g 6   amLODipine  (NORVASC ) 5 MG tablet Take 1 tablet (5 mg total) by mouth daily. 90 tablet 1   atorvastatin  (LIPITOR) 10 MG tablet Take 1 tablet (10 mg total) by mouth daily. 90 tablet 3   bisacodyl  (DULCOLAX) 5 MG EC tablet Take 1 tablet (5 mg total) by mouth as directed. 4 tablet 0   fluticasone  (FLONASE ) 50 MCG/ACT nasal spray Place 2 sprays into both nostrils daily. 16 g 6   levothyroxine  (SYNTHROID ) 150 MCG tablet Take 1 tablet (150 mcg total) by mouth daily before breakfast. 90 tablet 2    mometasone -formoterol  (DULERA ) 200-5 MCG/ACT AERO Inhale 2 puffs into the lungs 2 (two) times daily. 13 g 6   polyethylene glycol-electrolytes (NULYTELY) 420 g solution Use as directed 4000 mL 0   pregabalin  (LYRICA ) 50 MG capsule Take 1 capsule (50 mg total) by mouth 3 (three) times daily. FOR BACK PAIN, Foot pain 90 capsule 0   No current facility-administered medications on file prior to visit.  [2]  Allergies Allergen Reactions   Fentanyl  Nausea And Vomiting   Citalopram Other (See Comments)  Dizziness and shakiness.   Morphine  And Codeine Nausea And Vomiting   "

## 2024-08-22 ENCOUNTER — Other Ambulatory Visit: Payer: Self-pay

## 2024-08-22 ENCOUNTER — Ambulatory Visit: Payer: Self-pay | Admitting: Internal Medicine

## 2024-08-22 ENCOUNTER — Telehealth: Payer: Self-pay

## 2024-08-22 DIAGNOSIS — E034 Atrophy of thyroid (acquired): Secondary | ICD-10-CM

## 2024-08-22 DIAGNOSIS — D582 Other hemoglobinopathies: Secondary | ICD-10-CM

## 2024-08-22 LAB — LIPID PANEL
Chol/HDL Ratio: 4.7 ratio (ref 0.0–5.0)
Cholesterol, Total: 166 mg/dL (ref 100–199)
HDL: 35 mg/dL — ABNORMAL LOW
LDL Chol Calc (NIH): 98 mg/dL (ref 0–99)
Triglycerides: 188 mg/dL — ABNORMAL HIGH (ref 0–149)
VLDL Cholesterol Cal: 33 mg/dL (ref 5–40)

## 2024-08-22 LAB — CBC
Hematocrit: 53.7 % — ABNORMAL HIGH (ref 37.5–51.0)
Hemoglobin: 18.2 g/dL — ABNORMAL HIGH (ref 13.0–17.7)
MCH: 33 pg (ref 26.6–33.0)
MCHC: 33.9 g/dL (ref 31.5–35.7)
MCV: 97 fL (ref 79–97)
Platelets: 285 10*3/uL (ref 150–450)
RBC: 5.52 x10E6/uL (ref 4.14–5.80)
RDW: 12.3 % (ref 11.6–15.4)
WBC: 7.5 10*3/uL (ref 3.4–10.8)

## 2024-08-22 LAB — COMPREHENSIVE METABOLIC PANEL WITH GFR
ALT: 7 [IU]/L (ref 0–44)
AST: 14 [IU]/L (ref 0–40)
Albumin: 4 g/dL (ref 3.8–4.9)
Alkaline Phosphatase: 71 [IU]/L (ref 47–123)
BUN/Creatinine Ratio: 23 (ref 10–24)
BUN: 25 mg/dL (ref 8–27)
Bilirubin Total: <0.2 mg/dL (ref 0.0–1.2)
CO2: 25 mmol/L (ref 20–29)
Calcium: 9.2 mg/dL (ref 8.6–10.2)
Chloride: 101 mmol/L (ref 96–106)
Creatinine, Ser: 1.11 mg/dL (ref 0.76–1.27)
Globulin, Total: 1.8 g/dL (ref 1.5–4.5)
Glucose: 94 mg/dL (ref 70–99)
Potassium: 4.8 mmol/L (ref 3.5–5.2)
Sodium: 138 mmol/L (ref 134–144)
Total Protein: 5.8 g/dL — ABNORMAL LOW (ref 6.0–8.5)
eGFR: 76 mL/min/{1.73_m2}

## 2024-08-22 LAB — PSA: Prostate Specific Ag, Serum: 1.8 ng/mL (ref 0.0–4.0)

## 2024-08-22 NOTE — Addendum Note (Signed)
 Addended by: VICCI SOBER B on: 08/22/2024 07:35 AM   Modules accepted: Orders

## 2024-08-22 NOTE — Progress Notes (Signed)
 On blood cell count, he has an elevated hemoglobin level.  This means he is not anemic but the level is higher than it should be.  I will ask the lab to do additional testing on blood that was already drawn to evaluate this further. -He has elevated triglyceride levels.  Healthy eating habits including avoiding fatty and fried foods will help to lower this level.  I have refilled his cholesterol-lowering medication called atorvastatin  when I saw him yesterday. -Kidney and liver function tests are good. -PSA, the screening test for prostate cancer is within normal range.

## 2024-08-22 NOTE — Progress Notes (Signed)
 Complex Care Management Note  Care Guide Note 08/22/2024 Name: HARLYN ITALIANO MRN: [id number] DOB: 1965-11-02  [name] is a 59 y.o. year old male who sees Vicci Barnie NOVAK, MD for primary care. I reached out to Darina FORBES Sable by phone today to offer complex care management services.  Mr. Goerner was given information about Complex Care Management services today including:   The Complex Care Management services include support from the care team which includes your Nurse Care Manager, Clinical Social Worker, or Pharmacist.  The Complex Care Management team is here to help remove barriers to the health concerns and goals most important to you. Complex Care Management services are voluntary, and the patient may decline or stop services at any time by request to their care team member.   Complex Care Management Consent Status: Patient agreed to services and verbal consent obtained.   Follow up plan:  Telephone appointment with complex care management team member scheduled for:  2/92/26 and 09/16/24  Encounter Outcome:  Patient Scheduled  Leotis [facility], [facility] Guide  Direct Dial: [phone]  Fax [phone]

## 2024-08-23 LAB — SPECIMEN STATUS REPORT

## 2024-08-23 LAB — FERRITIN: Ferritin: 73 ng/mL (ref 30–400)

## 2024-08-26 ENCOUNTER — Other Ambulatory Visit: Payer: Self-pay

## 2024-09-01 ENCOUNTER — Ambulatory Visit: Payer: Self-pay

## 2024-09-01 ENCOUNTER — Telehealth

## 2024-09-16 ENCOUNTER — Telehealth: Admitting: *Deleted

## 2024-09-17 ENCOUNTER — Other Ambulatory Visit
